# Patient Record
Sex: Male | Born: 1955 | Race: Black or African American | Hispanic: No | Marital: Married | Smoking: Never smoker
Health system: Southern US, Community
[De-identification: ages and names within clinical notes are randomized; demographics above are authoritative.]

## PROBLEM LIST (undated history)

## (undated) DIAGNOSIS — K598 Other specified functional intestinal disorders: Secondary | ICD-10-CM

## (undated) DIAGNOSIS — K5981 Ogilvie syndrome: Secondary | ICD-10-CM

## (undated) DIAGNOSIS — H548 Legal blindness, as defined in USA: Secondary | ICD-10-CM

## (undated) DIAGNOSIS — M109 Gout, unspecified: Secondary | ICD-10-CM

## (undated) DIAGNOSIS — E039 Hypothyroidism, unspecified: Secondary | ICD-10-CM

## (undated) DIAGNOSIS — M009 Pyogenic arthritis, unspecified: Secondary | ICD-10-CM

## (undated) DIAGNOSIS — K5909 Other constipation: Secondary | ICD-10-CM

## (undated) DIAGNOSIS — H9190 Unspecified hearing loss, unspecified ear: Secondary | ICD-10-CM

## (undated) HISTORY — PX: KNEE SURGERY: SHX244

## (undated) HISTORY — PX: EYE SURGERY: SHX253

## (undated) HISTORY — DX: Ogilvie syndrome: K59.81

## (undated) HISTORY — DX: Pyogenic arthritis, unspecified: M00.9

## (undated) HISTORY — DX: Other specified functional intestinal disorders: K59.8

---

## 2014-01-29 DIAGNOSIS — M24569 Contracture, unspecified knee: Secondary | ICD-10-CM | POA: Diagnosis not present

## 2014-02-18 DIAGNOSIS — H2510 Age-related nuclear cataract, unspecified eye: Secondary | ICD-10-CM | POA: Diagnosis not present

## 2014-02-18 DIAGNOSIS — H409 Unspecified glaucoma: Secondary | ICD-10-CM | POA: Diagnosis not present

## 2014-02-18 DIAGNOSIS — H4011X0 Primary open-angle glaucoma, stage unspecified: Secondary | ICD-10-CM | POA: Diagnosis not present

## 2014-02-25 DIAGNOSIS — K219 Gastro-esophageal reflux disease without esophagitis: Secondary | ICD-10-CM | POA: Diagnosis not present

## 2014-02-25 DIAGNOSIS — M24669 Ankylosis, unspecified knee: Secondary | ICD-10-CM | POA: Diagnosis not present

## 2014-02-25 DIAGNOSIS — Z79899 Other long term (current) drug therapy: Secondary | ICD-10-CM | POA: Diagnosis not present

## 2014-02-25 DIAGNOSIS — G8918 Other acute postprocedural pain: Secondary | ICD-10-CM | POA: Diagnosis not present

## 2014-02-25 DIAGNOSIS — E039 Hypothyroidism, unspecified: Secondary | ICD-10-CM | POA: Diagnosis not present

## 2014-02-25 DIAGNOSIS — M129 Arthropathy, unspecified: Secondary | ICD-10-CM | POA: Diagnosis not present

## 2014-02-25 DIAGNOSIS — H9190 Unspecified hearing loss, unspecified ear: Secondary | ICD-10-CM | POA: Diagnosis not present

## 2014-02-25 DIAGNOSIS — Z7382 Dual sensory impairment: Secondary | ICD-10-CM | POA: Diagnosis not present

## 2014-02-25 DIAGNOSIS — M24569 Contracture, unspecified knee: Secondary | ICD-10-CM | POA: Diagnosis not present

## 2014-02-25 DIAGNOSIS — H543 Unqualified visual loss, both eyes: Secondary | ICD-10-CM | POA: Diagnosis not present

## 2014-07-03 DIAGNOSIS — E039 Hypothyroidism, unspecified: Secondary | ICD-10-CM | POA: Diagnosis not present

## 2014-07-03 DIAGNOSIS — H913 Deaf nonspeaking, not elsewhere classified: Secondary | ICD-10-CM | POA: Diagnosis not present

## 2014-07-03 DIAGNOSIS — R5383 Other fatigue: Secondary | ICD-10-CM | POA: Diagnosis not present

## 2014-07-03 DIAGNOSIS — K219 Gastro-esophageal reflux disease without esophagitis: Secondary | ICD-10-CM | POA: Diagnosis not present

## 2014-07-03 DIAGNOSIS — F411 Generalized anxiety disorder: Secondary | ICD-10-CM | POA: Diagnosis not present

## 2014-07-03 DIAGNOSIS — M129 Arthropathy, unspecified: Secondary | ICD-10-CM | POA: Diagnosis not present

## 2014-07-03 DIAGNOSIS — H543 Unqualified visual loss, both eyes: Secondary | ICD-10-CM | POA: Diagnosis not present

## 2014-07-03 DIAGNOSIS — Z791 Long term (current) use of non-steroidal anti-inflammatories (NSAID): Secondary | ICD-10-CM | POA: Diagnosis not present

## 2014-07-03 DIAGNOSIS — R5381 Other malaise: Secondary | ICD-10-CM | POA: Diagnosis not present

## 2014-07-03 DIAGNOSIS — Z79899 Other long term (current) drug therapy: Secondary | ICD-10-CM | POA: Diagnosis not present

## 2014-07-03 DIAGNOSIS — R42 Dizziness and giddiness: Secondary | ICD-10-CM | POA: Diagnosis not present

## 2014-07-03 DIAGNOSIS — H548 Legal blindness, as defined in USA: Secondary | ICD-10-CM | POA: Diagnosis not present

## 2014-08-26 DIAGNOSIS — H2513 Age-related nuclear cataract, bilateral: Secondary | ICD-10-CM | POA: Diagnosis not present

## 2014-08-26 DIAGNOSIS — H4011X3 Primary open-angle glaucoma, severe stage: Secondary | ICD-10-CM | POA: Diagnosis not present

## 2014-09-17 DIAGNOSIS — K6389 Other specified diseases of intestine: Secondary | ICD-10-CM | POA: Diagnosis not present

## 2014-09-17 DIAGNOSIS — R141 Gas pain: Secondary | ICD-10-CM | POA: Diagnosis not present

## 2014-09-17 DIAGNOSIS — S8992XA Unspecified injury of left lower leg, initial encounter: Secondary | ICD-10-CM | POA: Diagnosis not present

## 2014-09-17 DIAGNOSIS — M25562 Pain in left knee: Secondary | ICD-10-CM | POA: Diagnosis not present

## 2014-09-17 DIAGNOSIS — Z79899 Other long term (current) drug therapy: Secondary | ICD-10-CM | POA: Diagnosis not present

## 2014-09-17 DIAGNOSIS — R109 Unspecified abdominal pain: Secondary | ICD-10-CM | POA: Diagnosis not present

## 2014-09-17 DIAGNOSIS — H905 Unspecified sensorineural hearing loss: Secondary | ICD-10-CM | POA: Diagnosis not present

## 2014-09-17 DIAGNOSIS — H540 Blindness, both eyes: Secondary | ICD-10-CM | POA: Diagnosis not present

## 2014-11-22 DIAGNOSIS — M25561 Pain in right knee: Secondary | ICD-10-CM | POA: Diagnosis not present

## 2014-11-22 DIAGNOSIS — R918 Other nonspecific abnormal finding of lung field: Secondary | ICD-10-CM | POA: Diagnosis not present

## 2014-11-22 DIAGNOSIS — Z79899 Other long term (current) drug therapy: Secondary | ICD-10-CM | POA: Diagnosis not present

## 2014-11-22 DIAGNOSIS — S8990XA Unspecified injury of unspecified lower leg, initial encounter: Secondary | ICD-10-CM | POA: Diagnosis not present

## 2014-11-22 DIAGNOSIS — Z01818 Encounter for other preprocedural examination: Secondary | ICD-10-CM | POA: Diagnosis not present

## 2014-11-22 DIAGNOSIS — Z760 Encounter for issue of repeat prescription: Secondary | ICD-10-CM | POA: Diagnosis not present

## 2014-11-22 DIAGNOSIS — H905 Unspecified sensorineural hearing loss: Secondary | ICD-10-CM | POA: Diagnosis not present

## 2014-11-22 DIAGNOSIS — M25562 Pain in left knee: Secondary | ICD-10-CM | POA: Diagnosis not present

## 2014-11-22 DIAGNOSIS — M009 Pyogenic arthritis, unspecified: Secondary | ICD-10-CM | POA: Diagnosis not present

## 2014-11-23 DIAGNOSIS — M25561 Pain in right knee: Secondary | ICD-10-CM | POA: Diagnosis not present

## 2014-11-23 DIAGNOSIS — Z760 Encounter for issue of repeat prescription: Secondary | ICD-10-CM | POA: Diagnosis not present

## 2014-11-23 DIAGNOSIS — M009 Pyogenic arthritis, unspecified: Secondary | ICD-10-CM | POA: Diagnosis not present

## 2014-11-23 DIAGNOSIS — R918 Other nonspecific abnormal finding of lung field: Secondary | ICD-10-CM | POA: Diagnosis not present

## 2014-11-23 DIAGNOSIS — R569 Unspecified convulsions: Secondary | ICD-10-CM | POA: Diagnosis not present

## 2014-11-23 DIAGNOSIS — S8990XA Unspecified injury of unspecified lower leg, initial encounter: Secondary | ICD-10-CM | POA: Diagnosis not present

## 2014-11-23 DIAGNOSIS — H409 Unspecified glaucoma: Secondary | ICD-10-CM | POA: Diagnosis not present

## 2014-11-23 DIAGNOSIS — H548 Legal blindness, as defined in USA: Secondary | ICD-10-CM | POA: Diagnosis not present

## 2014-11-23 DIAGNOSIS — Z01818 Encounter for other preprocedural examination: Secondary | ICD-10-CM | POA: Diagnosis not present

## 2014-11-23 DIAGNOSIS — M170 Bilateral primary osteoarthritis of knee: Secondary | ICD-10-CM | POA: Diagnosis not present

## 2014-11-23 DIAGNOSIS — M25462 Effusion, left knee: Secondary | ICD-10-CM | POA: Diagnosis not present

## 2014-11-23 DIAGNOSIS — H905 Unspecified sensorineural hearing loss: Secondary | ICD-10-CM | POA: Diagnosis not present

## 2014-11-23 DIAGNOSIS — R55 Syncope and collapse: Secondary | ICD-10-CM | POA: Diagnosis not present

## 2014-11-24 DIAGNOSIS — M25569 Pain in unspecified knee: Secondary | ICD-10-CM | POA: Diagnosis not present

## 2014-11-25 DIAGNOSIS — R55 Syncope and collapse: Secondary | ICD-10-CM | POA: Diagnosis not present

## 2014-11-25 DIAGNOSIS — R569 Unspecified convulsions: Secondary | ICD-10-CM | POA: Diagnosis not present

## 2014-11-26 DIAGNOSIS — H409 Unspecified glaucoma: Secondary | ICD-10-CM | POA: Diagnosis not present

## 2014-11-26 DIAGNOSIS — M25561 Pain in right knee: Secondary | ICD-10-CM | POA: Diagnosis not present

## 2014-11-26 DIAGNOSIS — M25562 Pain in left knee: Secondary | ICD-10-CM | POA: Diagnosis not present

## 2014-11-26 DIAGNOSIS — R112 Nausea with vomiting, unspecified: Secondary | ICD-10-CM | POA: Diagnosis not present

## 2014-11-26 DIAGNOSIS — H905 Unspecified sensorineural hearing loss: Secondary | ICD-10-CM | POA: Diagnosis not present

## 2014-11-26 DIAGNOSIS — R001 Bradycardia, unspecified: Secondary | ICD-10-CM | POA: Diagnosis not present

## 2014-11-26 DIAGNOSIS — H548 Legal blindness, as defined in USA: Secondary | ICD-10-CM | POA: Diagnosis not present

## 2014-11-26 DIAGNOSIS — R569 Unspecified convulsions: Secondary | ICD-10-CM | POA: Diagnosis not present

## 2014-11-26 DIAGNOSIS — R55 Syncope and collapse: Secondary | ICD-10-CM | POA: Diagnosis not present

## 2014-11-26 DIAGNOSIS — I495 Sick sinus syndrome: Secondary | ICD-10-CM | POA: Diagnosis not present

## 2014-11-26 DIAGNOSIS — M25461 Effusion, right knee: Secondary | ICD-10-CM | POA: Diagnosis not present

## 2014-11-26 DIAGNOSIS — Z590 Homelessness: Secondary | ICD-10-CM | POA: Diagnosis not present

## 2014-11-26 DIAGNOSIS — M25462 Effusion, left knee: Secondary | ICD-10-CM | POA: Diagnosis not present

## 2014-11-26 DIAGNOSIS — Z760 Encounter for issue of repeat prescription: Secondary | ICD-10-CM | POA: Diagnosis not present

## 2014-11-26 DIAGNOSIS — E039 Hypothyroidism, unspecified: Secondary | ICD-10-CM | POA: Diagnosis not present

## 2014-11-26 DIAGNOSIS — M170 Bilateral primary osteoarthritis of knee: Secondary | ICD-10-CM | POA: Diagnosis not present

## 2014-11-29 DIAGNOSIS — M25462 Effusion, left knee: Secondary | ICD-10-CM | POA: Diagnosis not present

## 2014-11-29 DIAGNOSIS — H548 Legal blindness, as defined in USA: Secondary | ICD-10-CM | POA: Diagnosis not present

## 2014-11-29 DIAGNOSIS — E039 Hypothyroidism, unspecified: Secondary | ICD-10-CM | POA: Diagnosis not present

## 2014-11-29 DIAGNOSIS — M170 Bilateral primary osteoarthritis of knee: Secondary | ICD-10-CM | POA: Diagnosis not present

## 2014-11-29 DIAGNOSIS — M25561 Pain in right knee: Secondary | ICD-10-CM | POA: Diagnosis not present

## 2014-11-29 DIAGNOSIS — H905 Unspecified sensorineural hearing loss: Secondary | ICD-10-CM | POA: Diagnosis not present

## 2014-11-29 DIAGNOSIS — H409 Unspecified glaucoma: Secondary | ICD-10-CM | POA: Diagnosis not present

## 2014-11-29 DIAGNOSIS — M25562 Pain in left knee: Secondary | ICD-10-CM | POA: Diagnosis not present

## 2014-12-01 DIAGNOSIS — M25561 Pain in right knee: Secondary | ICD-10-CM | POA: Diagnosis not present

## 2014-12-01 DIAGNOSIS — M25562 Pain in left knee: Secondary | ICD-10-CM | POA: Diagnosis not present

## 2014-12-01 DIAGNOSIS — E039 Hypothyroidism, unspecified: Secondary | ICD-10-CM | POA: Diagnosis not present

## 2014-12-01 DIAGNOSIS — H409 Unspecified glaucoma: Secondary | ICD-10-CM | POA: Diagnosis not present

## 2014-12-04 DIAGNOSIS — M25562 Pain in left knee: Secondary | ICD-10-CM | POA: Diagnosis not present

## 2014-12-04 DIAGNOSIS — E039 Hypothyroidism, unspecified: Secondary | ICD-10-CM | POA: Diagnosis not present

## 2014-12-04 DIAGNOSIS — H409 Unspecified glaucoma: Secondary | ICD-10-CM | POA: Diagnosis not present

## 2014-12-04 DIAGNOSIS — M25561 Pain in right knee: Secondary | ICD-10-CM | POA: Diagnosis not present

## 2014-12-11 DIAGNOSIS — H9190 Unspecified hearing loss, unspecified ear: Secondary | ICD-10-CM | POA: Diagnosis not present

## 2014-12-11 DIAGNOSIS — M25562 Pain in left knee: Secondary | ICD-10-CM | POA: Diagnosis not present

## 2014-12-11 DIAGNOSIS — M25561 Pain in right knee: Secondary | ICD-10-CM | POA: Diagnosis not present

## 2014-12-11 DIAGNOSIS — Z79899 Other long term (current) drug therapy: Secondary | ICD-10-CM | POA: Diagnosis not present

## 2014-12-11 DIAGNOSIS — E039 Hypothyroidism, unspecified: Secondary | ICD-10-CM | POA: Diagnosis not present

## 2014-12-11 DIAGNOSIS — M179 Osteoarthritis of knee, unspecified: Secondary | ICD-10-CM | POA: Diagnosis not present

## 2014-12-11 DIAGNOSIS — S79912A Unspecified injury of left hip, initial encounter: Secondary | ICD-10-CM | POA: Diagnosis not present

## 2014-12-11 DIAGNOSIS — S8992XA Unspecified injury of left lower leg, initial encounter: Secondary | ICD-10-CM | POA: Diagnosis not present

## 2014-12-11 DIAGNOSIS — F42 Obsessive-compulsive disorder: Secondary | ICD-10-CM | POA: Diagnosis not present

## 2014-12-11 DIAGNOSIS — H540 Blindness, both eyes: Secondary | ICD-10-CM | POA: Diagnosis not present

## 2014-12-12 DIAGNOSIS — M25562 Pain in left knee: Secondary | ICD-10-CM | POA: Diagnosis not present

## 2014-12-12 DIAGNOSIS — R251 Tremor, unspecified: Secondary | ICD-10-CM | POA: Diagnosis not present

## 2014-12-12 DIAGNOSIS — M79605 Pain in left leg: Secondary | ICD-10-CM | POA: Diagnosis not present

## 2014-12-12 DIAGNOSIS — R4182 Altered mental status, unspecified: Secondary | ICD-10-CM | POA: Diagnosis not present

## 2014-12-12 DIAGNOSIS — R258 Other abnormal involuntary movements: Secondary | ICD-10-CM | POA: Diagnosis not present

## 2014-12-12 DIAGNOSIS — G8929 Other chronic pain: Secondary | ICD-10-CM | POA: Diagnosis not present

## 2014-12-12 DIAGNOSIS — R55 Syncope and collapse: Secondary | ICD-10-CM | POA: Diagnosis not present

## 2015-05-14 DIAGNOSIS — H905 Unspecified sensorineural hearing loss: Secondary | ICD-10-CM | POA: Diagnosis not present

## 2015-05-14 DIAGNOSIS — H548 Legal blindness, as defined in USA: Secondary | ICD-10-CM | POA: Diagnosis not present

## 2015-05-14 DIAGNOSIS — K5900 Constipation, unspecified: Secondary | ICD-10-CM | POA: Diagnosis not present

## 2015-05-14 DIAGNOSIS — R10817 Generalized abdominal tenderness: Secondary | ICD-10-CM | POA: Diagnosis not present

## 2015-05-14 DIAGNOSIS — G8929 Other chronic pain: Secondary | ICD-10-CM | POA: Diagnosis not present

## 2015-05-14 DIAGNOSIS — Z79899 Other long term (current) drug therapy: Secondary | ICD-10-CM | POA: Diagnosis not present

## 2015-05-14 DIAGNOSIS — K6389 Other specified diseases of intestine: Secondary | ICD-10-CM | POA: Diagnosis not present

## 2015-05-14 DIAGNOSIS — K593 Megacolon, not elsewhere classified: Secondary | ICD-10-CM | POA: Diagnosis not present

## 2015-05-14 DIAGNOSIS — M064 Inflammatory polyarthropathy: Secondary | ICD-10-CM | POA: Diagnosis not present

## 2015-05-14 DIAGNOSIS — E039 Hypothyroidism, unspecified: Secondary | ICD-10-CM | POA: Diagnosis not present

## 2015-05-15 DIAGNOSIS — K593 Megacolon, not elsewhere classified: Secondary | ICD-10-CM | POA: Diagnosis not present

## 2015-08-03 ENCOUNTER — Emergency Department (HOSPITAL_COMMUNITY)
Admission: EM | Admit: 2015-08-03 | Discharge: 2015-08-04 | Disposition: A | Discharge status: Home / Self Care | Payer: Medicare Other | Attending: Emergency Medicine | Admitting: Emergency Medicine

## 2015-08-03 ENCOUNTER — Emergency Department (HOSPITAL_COMMUNITY): Payer: Medicare Other

## 2015-08-03 ENCOUNTER — Encounter (HOSPITAL_COMMUNITY): Payer: Self-pay | Admitting: Emergency Medicine

## 2015-08-03 DIAGNOSIS — Z79899 Other long term (current) drug therapy: Secondary | ICD-10-CM | POA: Diagnosis not present

## 2015-08-03 DIAGNOSIS — R1084 Generalized abdominal pain: Secondary | ICD-10-CM

## 2015-08-03 DIAGNOSIS — R112 Nausea with vomiting, unspecified: Secondary | ICD-10-CM | POA: Diagnosis not present

## 2015-08-03 DIAGNOSIS — R935 Abnormal findings on diagnostic imaging of other abdominal regions, including retroperitoneum: Secondary | ICD-10-CM | POA: Insufficient documentation

## 2015-08-03 DIAGNOSIS — H913 Deaf nonspeaking, not elsewhere classified: Secondary | ICD-10-CM | POA: Insufficient documentation

## 2015-08-03 DIAGNOSIS — H548 Legal blindness, as defined in USA: Secondary | ICD-10-CM | POA: Insufficient documentation

## 2015-08-03 DIAGNOSIS — R948 Abnormal results of function studies of other organs and systems: Secondary | ICD-10-CM | POA: Diagnosis not present

## 2015-08-03 HISTORY — DX: Legal blindness, as defined in USA: H54.8

## 2015-08-03 HISTORY — DX: Unspecified hearing loss, unspecified ear: H91.90

## 2015-08-03 LAB — CBC
HEMATOCRIT: 29.6 % — AB (ref 39.0–52.0)
HEMOGLOBIN: 9.9 g/dL — AB (ref 13.0–17.0)
MCH: 31.4 pg (ref 26.0–34.0)
MCHC: 33.4 g/dL (ref 30.0–36.0)
MCV: 94 fL (ref 78.0–100.0)
Platelets: 191 10*3/uL (ref 150–400)
RBC: 3.15 MIL/uL — ABNORMAL LOW (ref 4.22–5.81)
RDW: 15.8 % — AB (ref 11.5–15.5)
WBC: 4.2 10*3/uL (ref 4.0–10.5)

## 2015-08-03 LAB — COMPREHENSIVE METABOLIC PANEL
ALBUMIN: 4.8 g/dL (ref 3.5–5.0)
ALT: 79 U/L — ABNORMAL HIGH (ref 17–63)
AST: 66 U/L — AB (ref 15–41)
Alkaline Phosphatase: 70 U/L (ref 38–126)
Anion gap: 7 (ref 5–15)
BUN: 25 mg/dL — AB (ref 6–20)
CHLORIDE: 102 mmol/L (ref 101–111)
CO2: 28 mmol/L (ref 22–32)
Calcium: 9.9 mg/dL (ref 8.9–10.3)
Creatinine, Ser: 1.26 mg/dL — ABNORMAL HIGH (ref 0.61–1.24)
GFR calc Af Amer: >60 mL/min (ref >60)
GLUCOSE: 91 mg/dL (ref 65–99)
POTASSIUM: 4 mmol/L (ref 3.5–5.1)
Sodium: 137 mmol/L (ref 135–145)
Total Bilirubin: 0.7 mg/dL (ref 0.3–1.2)
Total Protein: 9 g/dL — ABNORMAL HIGH (ref 6.5–8.1)

## 2015-08-03 LAB — LIPASE, BLOOD: LIPASE: 48 U/L (ref 11–51)

## 2015-08-03 MED ORDER — IOHEXOL 300 MG/ML  SOLN
100.0000 mL | Freq: Once | INTRAMUSCULAR | Status: AC | PRN
  Administered 2015-08-03: 100 mL via INTRAVENOUS

## 2015-08-03 MED ORDER — ONDANSETRON HCL 4 MG/2ML IJ SOLN
4.0000 mg | Freq: Once | INTRAMUSCULAR | Status: AC
  Administered 2015-08-03: 4 mg via INTRAVENOUS
  Filled 2015-08-03: qty 2

## 2015-08-03 MED ORDER — IOHEXOL 300 MG/ML  SOLN
50.0000 mL | Freq: Once | INTRAMUSCULAR | Status: AC | PRN
  Administered 2015-08-03: 50 mL via ORAL

## 2015-08-03 MED ORDER — MORPHINE SULFATE (PF) 4 MG/ML IV SOLN
4.0000 mg | Freq: Once | INTRAVENOUS | Status: AC
  Administered 2015-08-03: 4 mg via INTRAVENOUS
  Filled 2015-08-03: qty 1

## 2015-08-03 NOTE — ED Notes (Signed)
Pt is aware of the need for urine, urinal at bedside  

## 2015-08-03 NOTE — ED Notes (Signed)
Bed: [id number] Expected date:  Expected time:  Means of arrival:  Comments: abd pain, pt is deaf

## 2015-08-03 NOTE — ED Notes (Addendum)
Pt states he started having abdominal pain x 2 hours ago with distention and pain. Tenderness in RLQ. Pt is taking laxatives but unable to get accurate medical hx as pt is deaf. Alert and oriented. Blind and deaf.

## 2015-08-03 NOTE — ED Notes (Signed)
Patient transported to CT 

## 2015-08-03 NOTE — ED Notes (Signed)
Sign language interpreter able to convey plan of care to patient including: CT scan/need for urine sample/pain medications/rectal temperature. No other c/c. Acknowledges understanding.

## 2015-08-03 NOTE — ED Provider Notes (Signed)
CSN: [id number]     Arrival date & time 08/03/15  2027 History   First MD Initiated Contact with Patient 08/03/15 2105     Chief Complaint  Patient presents with  . 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  History obtained using sign language interpreter who is at bedside. Patient report he has had recurrent abdominal pain ongoing for the past month. He described pain as an uncomfortable feeling radiating across his abdomen. Pain has Progressively worse throughout the day today. Pain is now sharp and constant nothing seems to make it better or worse. He endorsed nausea and has vomited once and having some loose stools. He is feeling more fatigue. Report mild discomfort with urination. He has been taking gabapentin prescribed by his doctor for this pain without adequate relief. No complaints of fever, chills, chest pain, difficulty breathing, productive cough, back pain. He denies alcohol abuse. He has no prior abdominal surgery. He is a poor historian.    Past Medical History  Diagnosis Date  . Deaf   . Legally blind    No past surgical history on file. No family history on file. Social History  Substance Use Topics  . Smoking status: Not on file  . Smokeless tobacco: Not on file  . Alcohol Use: Not on file    Review of Systems  All other systems reviewed and are negative.     Allergies  Review of patient's allergies indicates no known allergies.  Home Medications   Prior to Admission medications   Medication Sig Start Date End Date Taking? Authorizing Provider  bimatoprost (LUMIGAN) 0.01 % SOLN Place 1 drop into both eyes at bedtime.   Yes Historical Provider, MD  diclofenac (VOLTAREN) 75 MG EC tablet Take 75 mg by mouth 2 (two) times daily.   Yes Historical Provider, MD  DORZOLAMIDE HCL-TIMOLOL MAL OP Apply 1 drop to eye 2  (two) times daily.   Yes Historical Provider, MD  gabapentin (NEURONTIN) 300 MG capsule Take 300 mg by mouth 3 (three) times daily.   Yes Historical Provider, MD  levothyroxine (SYNTHROID, LEVOTHROID) 125 MCG tablet Take 125 mcg by mouth daily before breakfast.   Yes Historical Provider, MD  Multiple Vitamin (MULTIVITAMIN WITH MINERALS) TABS tablet Take 1 tablet by mouth daily.   Yes Historical Provider, MD  senna (SENOKOT) 8.6 MG TABS tablet Take 1 tablet by mouth 2 (two) times daily as needed for mild constipation.   Yes Historical Provider, MD   BP 128/78 mmHg  Pulse 57  Resp 18  SpO2 96% Physical Exam  Constitutional: He appears well-developed and well-nourished. No distress.  African-American male nontoxic in appearance, who is both legally blind and deaf  HENT:  Head: Atraumatic.  Eyes: Conjunctivae are normal.  Neck: Neck supple.  Cardiovascular: Normal rate and regular rhythm.   Pulmonary/Chest: Effort normal and breath sounds normal. He exhibits no tenderness.  Abdominal: Bowel sounds are normal. He exhibits distension. There is tenderness (Diffuse abdominal tenderness with guarding and rebound tenderness.).  Neurological: He is alert.  Skin: No rash noted.  Psychiatric: He has a normal mood and affect.  Nursing note and vitals reviewed.   ED Course  Procedures (including critical care time)  Patient here with diffuse abdominal tenderness with guarding and rebound tenderness. Abdomen is distended but bowel sounds are present. Workup initiated.  10:49 PM EMERGENCY DEPARTMENT [place] FAST EXAM  INDICATIONS:Teaching study  PERFORMED BY: Myself  IMAGES ARCHIVED?: Yes  FINDINGS: All views negative  LIMITATIONS:  Emergent procedure  INTERPRETATION:  No abdominal free fluid and Indeterminate abdominal study. No pericardial effusion  COMMENT:  FAST exam, unable to visualize pericardium adequately due to bowel gas.    12:30 AM No evidence of leukocytosis.'s of mild renal  insufficiency with BUN 25, creatinine 1.26. IV fluid given. Abdominal and pelvis CT scan shows severe gaseous distention in the transverse colon which appears to be an isolated finding and no indication to suggest acute obstruction at this time. At this time patient also able to tolerates by mouth pain has improved.  Recommend pt to f/u with GI specialist at the earliest convenient for further evaluation of his condition.  Care discussed with Dr. Estell HarpinZammit.  Pt agrees with plan.  Return precaution discussed.    Labs Review Labs Reviewed  COMPREHENSIVE METABOLIC PANEL - Abnormal; Notable for the following:    BUN 25 (*)    Creatinine, Ser 1.26 (*)    Total Protein 9.0 (*)    AST 66 (*)    ALT 79 (*)    All other components within normal limits  CBC - Abnormal; Notable for the following:    RBC 3.15 (*)    Hemoglobin 9.9 (*)    HCT 29.6 (*)    RDW 15.8 (*)    All other components within normal limits  LIPASE, BLOOD  URINALYSIS, ROUTINE W REFLEX MICROSCOPIC (NOT AT [facility])    Imaging Review Ct Abdomen Pelvis W Contrast  08/04/2015  CLINICAL DATA:  59 year old male with abdominal pain and distention for the past 2 hours, most severe in the right lower quadrant. EXAM: CT ABDOMEN AND PELVIS WITH CONTRAST TECHNIQUE: Multidetector CT imaging of the abdomen and pelvis was performed using the standard protocol following bolus administration of intravenous contrast. CONTRAST:  50mL OMNIPAQUE IOHEXOL 300 MG/ML SOLN, 100mL OMNIPAQUE IOHEXOL 300 MG/ML SOLN COMPARISON:  No priors. 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 Hepatobiliary: No cystic or solid hepatic lesions. No intra or extrahepatic biliary ductal dilatation. Gallbladder is unremarkable in appearance. Pancreas: No pancreatic mass. No pancreatic ductal dilatation. No pancreatic  or peripancreatic fluid or inflammatory changes. Spleen: Unremarkable. Adrenals/Urinary Tract: Bilateral kidneys and bilateral adrenal glands are normal in appearance. No hydroureteronephrosis. Urinary bladder is normal in appearance. Stomach/Bowel: Normal appearance of the stomach. No pathologic dilatation of small bowel. Diffuse gaseous distention of the colon, most severe in the mid transverse colon with the colon is dilated up to 14.6 cm in diameter. The cecum and ascending colon do not appear dilated, and are filled with stool. The descending colon also does not appear dilated, and there is a small amount of gas and stool in the rectum. Appendix is not confidently identified, but there are no overt inflammatory changes adjacent to the cecum to strongly suggest presence of an acute appendicitis at this time. Vascular/Lymphatic: Atherosclerosis throughout the abdominal and pelvic vasculature, without evidence of aneurysm or dissection. No swirling of the vasculature in the mesenteries to suggest a volvulus. No lymphadenopathy noted in the abdomen or pelvis. Reproductive: Prostate gland and seminal vesicles are unremarkable in appearance. Other: No significant volume of ascites.  No pneumoperitoneum. Musculoskeletal: There are no aggressive appearing lytic or blastic lesions noted in the visualized portions of the skeleton. IMPRESSION: 1. Severe gaseous distention in  the transverse colon. This appears to be an isolated finding. Given the lack of proximal distention, and the presence of some distal gas and stool, this is not favored to be indicative of acute obstruction at this time. This may be chronic and related to dysmotility (however, we have no prior studies available for comparison). 2. No other acute findings are noted on today's examination. 3. Incidental findings, as above. Electronically Signed   By: Trudie Reed M.D.   On: 08/04/2015 00:07   I have personally reviewed and evaluated these images  and lab results as part of my medical decision-making.   EKG Interpretation None      MDM   Final diagnoses:  Generalized abdominal pain  Non-intractable vomiting with nausea, vomiting of unspecified type  Abnormal abdominal CT scan    BP 124/85 mmHg  Pulse 61  Temp(Src) 98.4 F (36.9 C) (Rectal)  Resp 17  SpO2 97%     Fayrene Helper, PA-C 08/04/15 0032  Bethann Berkshire, MD 08/06/15 1515

## 2015-08-04 DIAGNOSIS — R1084 Generalized abdominal pain: Secondary | ICD-10-CM | POA: Diagnosis not present

## 2015-08-04 DIAGNOSIS — M1712 Unilateral primary osteoarthritis, left knee: Secondary | ICD-10-CM | POA: Diagnosis not present

## 2015-08-04 DIAGNOSIS — M25562 Pain in left knee: Secondary | ICD-10-CM | POA: Diagnosis not present

## 2015-08-04 DIAGNOSIS — M25561 Pain in right knee: Secondary | ICD-10-CM | POA: Diagnosis not present

## 2015-08-04 MED ORDER — ONDANSETRON HCL 4 MG PO TABS: 4.0000 mg | ORAL_TABLET | Freq: Three times a day (TID) | ORAL | Status: DC | PRN

## 2015-08-04 MED ORDER — MORPHINE SULFATE (PF) 4 MG/ML IV SOLN
4.0000 mg | Freq: Once | INTRAVENOUS | Status: AC
  Administered 2015-08-04: 4 mg via INTRAVENOUS
  Filled 2015-08-04: qty 1

## 2015-08-04 NOTE — ED Notes (Signed)
Patient was alert, oriented and stable upon discharge. RN went over AVS and patient had no further questions. Interpreter was present for entire visit as well as d/c.

## 2015-08-04 NOTE — Discharge Instructions (Signed)
Please follow up with a GI specialist this week for further evaluation of your condition.  Take zofran as needed for nausea.  Return to ER if your condition worsen or if you have any other concerns.   Abdominal Pain, Adult Many things can cause abdominal pain. Usually, abdominal pain is not caused by a disease and will improve without treatment. It can often be observed and treated at home. Your health care provider will do a physical exam and possibly order blood tests and X-rays to help determine the seriousness of your pain. However, in many cases, more time must pass before a clear cause of the pain can be found. Before that point, your health care provider may not know if you need more testing or further treatment. HOME CARE INSTRUCTIONS Monitor your abdominal pain for any changes. The following actions may help to alleviate any discomfort you are experiencing:  Only take over-the-counter or prescription medicines as directed by your health care provider.  Do not take laxatives unless directed to do so by your health care provider.  Try a clear liquid diet (broth, tea, or water) as directed by your health care provider. Slowly move to a bland diet as tolerated. SEEK MEDICAL CARE IF:  You have unexplained abdominal pain.  You have abdominal pain associated with nausea or diarrhea.  You have pain when you urinate or have a bowel movement.  You experience abdominal pain that wakes you in the night.  You have abdominal pain that is worsened or improved by eating food.  You have abdominal pain that is worsened with eating fatty foods.  You have a fever. SEEK IMMEDIATE MEDICAL CARE IF:  Your pain does not go away within 2 hours.  You keep throwing up (vomiting).  Your pain is felt only in portions of the abdomen, such as the right side or the left lower portion of the abdomen.  You pass bloody or black tarry stools. MAKE SURE YOU:  Understand these instructions.  Will watch  your condition.  Will get help right away if you are not doing well or get worse.   This information is not intended to replace advice given to you by your health care provider. Make sure you discuss any questions you have with your health care provider.   Document Released: 07/06/2005 Document Revised: 06/17/2015 Document Reviewed: 06/05/2013 Elsevier Interactive Patient Education [employer]2016 [employer] Inc.

## 2015-12-17 ENCOUNTER — Emergency Department (HOSPITAL_COMMUNITY): Payer: Medicare Other

## 2015-12-17 ENCOUNTER — Encounter (HOSPITAL_COMMUNITY): Payer: Self-pay | Admitting: Emergency Medicine

## 2015-12-17 ENCOUNTER — Emergency Department (HOSPITAL_COMMUNITY)
Admission: EM | Admit: 2015-12-17 | Discharge: 2015-12-17 | Disposition: A | Discharge status: Home / Self Care | Payer: Medicare Other | Attending: Emergency Medicine | Admitting: Emergency Medicine

## 2015-12-17 DIAGNOSIS — Z791 Long term (current) use of non-steroidal anti-inflammatories (NSAID): Secondary | ICD-10-CM | POA: Insufficient documentation

## 2015-12-17 DIAGNOSIS — M25461 Effusion, right knee: Secondary | ICD-10-CM | POA: Diagnosis not present

## 2015-12-17 DIAGNOSIS — H548 Legal blindness, as defined in USA: Secondary | ICD-10-CM | POA: Diagnosis not present

## 2015-12-17 DIAGNOSIS — M7989 Other specified soft tissue disorders: Secondary | ICD-10-CM | POA: Diagnosis not present

## 2015-12-17 DIAGNOSIS — Z79899 Other long term (current) drug therapy: Secondary | ICD-10-CM | POA: Insufficient documentation

## 2015-12-17 DIAGNOSIS — H9190 Unspecified hearing loss, unspecified ear: Secondary | ICD-10-CM | POA: Insufficient documentation

## 2015-12-17 DIAGNOSIS — M79642 Pain in left hand: Secondary | ICD-10-CM | POA: Insufficient documentation

## 2015-12-17 DIAGNOSIS — M25561 Pain in right knee: Secondary | ICD-10-CM | POA: Diagnosis present

## 2015-12-17 DIAGNOSIS — M79643 Pain in unspecified hand: Secondary | ICD-10-CM | POA: Diagnosis not present

## 2015-12-17 DIAGNOSIS — E079 Disorder of thyroid, unspecified: Secondary | ICD-10-CM | POA: Insufficient documentation

## 2015-12-17 DIAGNOSIS — M25569 Pain in unspecified knee: Secondary | ICD-10-CM | POA: Diagnosis not present

## 2015-12-17 HISTORY — DX: Gout, unspecified: M10.9

## 2015-12-17 LAB — SYNOVIAL CELL COUNT + DIFF, W/ CRYSTALS
Crystals, Fluid: NONE SEEN
Lymphocytes-Synovial Fld: 6 % (ref 0–20)
Monocyte-Macrophage-Synovial Fluid: 3 % — ABNORMAL LOW (ref 50–90)
Neutrophil, Synovial: 91 % — ABNORMAL HIGH (ref 0–25)
WBC, Synovial: 65190 /mm3 — ABNORMAL HIGH (ref 0–200)

## 2015-12-17 LAB — GRAM STAIN

## 2015-12-17 MED ORDER — LIDOCAINE HCL 2 % IJ SOLN
20.0000 mL | Freq: Once | INTRAMUSCULAR | Status: AC
  Administered 2015-12-17: 20 mL via INTRADERMAL

## 2015-12-17 MED ORDER — HYDROCODONE-ACETAMINOPHEN 5-325 MG PO TABS: 1.0000 | ORAL_TABLET | Freq: Four times a day (QID) | ORAL | Status: DC | PRN

## 2015-12-17 MED ORDER — OXYCODONE-ACETAMINOPHEN 5-325 MG PO TABS: 1.0000 | ORAL_TABLET | Freq: Four times a day (QID) | ORAL | Status: DC | PRN

## 2015-12-17 MED ORDER — ONDANSETRON 4 MG PO TBDP
4.0000 mg | ORAL_TABLET | Freq: Once | ORAL | Status: AC
  Administered 2015-12-17: 4 mg via ORAL
  Filled 2015-12-17: qty 1

## 2015-12-17 MED ORDER — PREDNISONE 50 MG PO TABS: 50.0000 mg | ORAL_TABLET | Freq: Every day | ORAL | Status: DC

## 2015-12-17 MED ORDER — LIDOCAINE HCL 2 % IJ SOLN
INTRAMUSCULAR | Status: AC
  Administered 2015-12-17: 20 mL via INTRADERMAL
  Filled 2015-12-17: qty 20

## 2015-12-17 MED ORDER — DOXYCYCLINE HYCLATE 100 MG PO TABS
100.0000 mg | ORAL_TABLET | Freq: Once | ORAL | Status: AC
  Administered 2015-12-17: 100 mg via ORAL
  Filled 2015-12-17: qty 1

## 2015-12-17 MED ORDER — OXYCODONE-ACETAMINOPHEN 5-325 MG PO TABS
1.0000 | ORAL_TABLET | Freq: Once | ORAL | Status: AC
  Administered 2015-12-17: 1 via ORAL
  Filled 2015-12-17: qty 1

## 2015-12-17 MED ORDER — DOXYCYCLINE HYCLATE 100 MG PO CAPS: 100.0000 mg | ORAL_CAPSULE | Freq: Two times a day (BID) | ORAL | Status: DC

## 2015-12-17 NOTE — ED Notes (Signed)
Pt and wife given sandwiches and soda while waiting for new interpreter to arrive.

## 2015-12-17 NOTE — ED Notes (Signed)
Per EMS, states B/L leg pain and left hand pain-left hand swelling-has not been taking meds-possible gout

## 2015-12-17 NOTE — ED Notes (Signed)
Pt is deaf.  Interpreters bedside

## 2015-12-17 NOTE — ED Notes (Signed)
Case Management contacted.  VM left requesting assistance for Pt with medical transportation.

## 2015-12-17 NOTE — Progress Notes (Signed)
Entered in d/c instructions Please use the list of medicare doctors provided to you to assist with finding a doctor for follow up care Schedule an appointment as soon as possible for a visit As needed

## 2015-12-17 NOTE — ED Notes (Signed)
Results given to [place] Village[place] PA

## 2015-12-17 NOTE — ED Notes (Signed)
Pt ambulated in the hall with crutches.  Pt is very weak and is unable to go more than a few steps and is not safe doing so.Marland Kitchen.  [facility] notified of need for transport.  PTAR states that they will transport wife home also.

## 2015-12-17 NOTE — ED Provider Notes (Addendum)
CSN: [id number]     Arrival date & time 12/17/15  1140 History  By signing my name below, I, Bethel Born, attest that this documentation has been prepared under the direction and in the presence of [employer]. Electronically Signed: Bethel Born, ED Scribe. 12/17/2015 2:56 PM   Chief Complaint  Patient presents with  . B/L knee pain   . Hand Pain    The history is provided by the patient. A language interpreter was used (ASL).   Martin [name] is a 60 y.o. male who is legally blind and deaf with history of gout who presents to the Emergency Department complaining of recurrent, atraumatic, 8/10 in severity,  right knee pain and swelling with onset a couple of weeks ago. He has had similar swelling in the past that required draining in [place] "a long time ago". He also complains of pain at the left long finger.  Past Medical History  Diagnosis Date  . Deaf   . Legally blind   . Thyroid disease   . Gout    History reviewed. No pertinent past surgical history. No family history on file. Social History  Substance Use Topics  . Smoking status: Never Smoker   . Smokeless tobacco: None  . Alcohol Use: No    Review of Systems  All other systems negative except as documented in the HPI. All pertinent positives and negatives as reviewed in the HPI.  Allergies  Review of patient's allergies indicates no known allergies.  Home Medications   Prior to Admission medications   Medication Sig Start Date End Date Taking? Authorizing Provider  bimatoprost (LUMIGAN) 0.01 % SOLN Place 1 drop into both eyes at bedtime.    Historical Provider, MD  diclofenac (VOLTAREN) 75 MG EC tablet Take 75 mg by mouth 2 (two) times daily.    Historical Provider, MD  DORZOLAMIDE HCL-TIMOLOL MAL OP Apply 1 drop to eye 2 (two) times daily.    Historical Provider, MD  gabapentin (NEURONTIN) 300 MG capsule Take 300 mg by mouth 3 (three) times daily.    Historical Provider, MD  levothyroxine  (SYNTHROID, LEVOTHROID) 125 MCG tablet Take 125 mcg by mouth daily before breakfast.    Historical Provider, MD  Multiple Vitamin (MULTIVITAMIN WITH MINERALS) TABS tablet Take 1 tablet by mouth daily.    Historical Provider, MD  ondansetron (ZOFRAN) 4 MG tablet Take 1 tablet (4 mg total) by mouth every 8 (eight) hours as needed for nausea or vomiting. 08/04/15   Fayrene Helper, PA-C  senna (SENOKOT) 8.6 MG TABS tablet Take 1 tablet by mouth 2 (two) times daily as needed for mild constipation.    Historical Provider, MD   BP 109/78 mmHg  Pulse 70  Resp 16  SpO2 99% Physical Exam  Constitutional: He is oriented to person, place, and time. He appears well-developed and well-nourished. No distress.  HENT:  Head: Normocephalic and atraumatic.  Eyes: Conjunctivae and EOM are normal.  Neck: Neck supple. No tracheal deviation present.  Cardiovascular: Normal rate.   Pulmonary/Chest: Effort normal. No respiratory distress.  Musculoskeletal: Normal range of motion.  Tenderness and swelling noted to the right knee Tenderness over the dorsum of the left hand   Neurological: He is alert and oriented to person, place, and time.  Skin: Skin is warm and dry.  Psychiatric: He has a normal mood and affect. His behavior is normal.  Nursing note and vitals reviewed.   ED Course  Procedures (including critical care time)  ARTHROCENTESIS PROCEDURE  NOTE After consent was obtained, using sterile technique the right knee was prepped and 3ccs lidocaine 2% was used as local anesthetic. The joint was entered and 30 ml's of cloudy golden fluid was withdrawn and sent for culture. The procedure was well tolerated.  The patient is asked to continue to rest the joint for a few more days before resuming regular activities.  It may be more painful for the first 1-2 days.  Watch for fever, or increased swelling or persistent pain in the joint. Call or return to clinic prn if such symptoms occur or there is failure to improve  as anticipated.   DIAGNOSTIC STUDIES: Oxygen Saturation is 99% on RA,  normal by my interpretation.    COORDINATION OF CARE: 12:44 PM Discussed treatment plan which includes right knee XR with pt at bedside and pt agreed to plan.  Imaging Review Dg Knee Complete 4 Views Right  12/17/2015  CLINICAL DATA:  Bilateral leg pain, history of gout, pain and swelling right knee for 2 days EXAM: RIGHT KNEE - COMPLETE 4+ VIEW COMPARISON:  None. FINDINGS: Five views of the right knee submitted. No acute fracture or subluxation. Mild narrowing of medial joint compartment. There is old fracture deformity proximal shaft of right fibula. Moderate joint effusion. IMPRESSION: No acute fracture or subluxation. Mild degenerative changes. Moderate joint effusion. Old fracture of proximal shaft of the right fibula. Electronically Signed   By: Natasha MeadLiviu  Pop M.D.   On: 12/17/2015 13:36   I have personally reviewed and evaluated these images as part of my medical decision-making.  Patient be placed in a knee immobilizer with crutches.  Patient is referred to orthopedics.  Told to return here as needed I spoke with Dr. Eulah PontMurphy who reviewed the patient's lab testing and will follow-up with him tomorrow in his office.  He wants to start the patient on doxycycline.  Patient is given the plan and all questions were answered Charlestine NightChristopher Climmie Buelow, PA-C 12/17/15 1554  Derwood KaplanAnkit Nanavati, MD 12/17/15 1619  Charlestine Nighthristopher Salar Molden, PA-C 12/17/15 1736  Derwood KaplanAnkit Nanavati, MD 12/18/15 1513

## 2015-12-17 NOTE — Progress Notes (Signed)
Pt with 2 ED visits and 0 admissions legally blind and deaf with sign language interpreter and male visitor at bedside  Pt confirms no pcp x 2 Pt given a list of medicare providers within his zip code to assist with finding a pcp/family doctor for f/u services

## 2015-12-17 NOTE — Discharge Instructions (Signed)
Return here as needed. Ice and elevate the knee. Follow up with the orthopedist provided.

## 2015-12-17 NOTE — ED Notes (Signed)
Pt left with PTAR at 1900

## 2015-12-18 ENCOUNTER — Telehealth: Payer: Self-pay | Admitting: *Deleted

## 2015-12-21 ENCOUNTER — Inpatient Hospital Stay (HOSPITAL_COMMUNITY): Payer: Medicare Other

## 2015-12-21 ENCOUNTER — Inpatient Hospital Stay (HOSPITAL_COMMUNITY)
Admission: AD | Admit: 2015-12-21 | Discharge: 2015-12-25 | DRG: 488 | Disposition: A | Discharge status: Home Health | Payer: Medicare Other | Source: Ambulatory Visit | Attending: Family Medicine | Admitting: Family Medicine

## 2015-12-21 ENCOUNTER — Encounter (HOSPITAL_COMMUNITY): Payer: Self-pay | Admitting: General Practice

## 2015-12-21 DIAGNOSIS — E43 Unspecified severe protein-calorie malnutrition: Secondary | ICD-10-CM | POA: Insufficient documentation

## 2015-12-21 DIAGNOSIS — M7989 Other specified soft tissue disorders: Secondary | ICD-10-CM | POA: Diagnosis not present

## 2015-12-21 DIAGNOSIS — M109 Gout, unspecified: Secondary | ICD-10-CM | POA: Diagnosis present

## 2015-12-21 DIAGNOSIS — K5900 Constipation, unspecified: Secondary | ICD-10-CM | POA: Diagnosis not present

## 2015-12-21 DIAGNOSIS — H9190 Unspecified hearing loss, unspecified ear: Secondary | ICD-10-CM | POA: Diagnosis present

## 2015-12-21 DIAGNOSIS — K5909 Other constipation: Secondary | ICD-10-CM | POA: Diagnosis not present

## 2015-12-21 DIAGNOSIS — H9193 Unspecified hearing loss, bilateral: Secondary | ICD-10-CM | POA: Diagnosis not present

## 2015-12-21 DIAGNOSIS — G894 Chronic pain syndrome: Secondary | ICD-10-CM

## 2015-12-21 DIAGNOSIS — Z638 Other specified problems related to primary support group: Secondary | ICD-10-CM | POA: Diagnosis not present

## 2015-12-21 DIAGNOSIS — M25562 Pain in left knee: Secondary | ICD-10-CM | POA: Diagnosis not present

## 2015-12-21 DIAGNOSIS — S83241A Other tear of medial meniscus, current injury, right knee, initial encounter: Secondary | ICD-10-CM | POA: Diagnosis not present

## 2015-12-21 DIAGNOSIS — Z23 Encounter for immunization: Secondary | ICD-10-CM | POA: Diagnosis not present

## 2015-12-21 DIAGNOSIS — H548 Legal blindness, as defined in USA: Secondary | ICD-10-CM | POA: Diagnosis not present

## 2015-12-21 DIAGNOSIS — R740 Nonspecific elevation of levels of transaminase and lactic acid dehydrogenase [LDH]: Secondary | ICD-10-CM | POA: Diagnosis not present

## 2015-12-21 DIAGNOSIS — E039 Hypothyroidism, unspecified: Secondary | ICD-10-CM | POA: Diagnosis not present

## 2015-12-21 DIAGNOSIS — B9689 Other specified bacterial agents as the cause of diseases classified elsewhere: Secondary | ICD-10-CM | POA: Diagnosis not present

## 2015-12-21 DIAGNOSIS — K567 Ileus, unspecified: Secondary | ICD-10-CM | POA: Diagnosis present

## 2015-12-21 DIAGNOSIS — Z681 Body mass index (BMI) 19 or less, adult: Secondary | ICD-10-CM

## 2015-12-21 DIAGNOSIS — M25461 Effusion, right knee: Secondary | ICD-10-CM | POA: Diagnosis present

## 2015-12-21 DIAGNOSIS — M19042 Primary osteoarthritis, left hand: Secondary | ICD-10-CM | POA: Diagnosis present

## 2015-12-21 DIAGNOSIS — R6521 Severe sepsis with septic shock: Secondary | ICD-10-CM | POA: Diagnosis not present

## 2015-12-21 DIAGNOSIS — N179 Acute kidney failure, unspecified: Secondary | ICD-10-CM | POA: Diagnosis present

## 2015-12-21 DIAGNOSIS — M25561 Pain in right knee: Secondary | ICD-10-CM | POA: Diagnosis not present

## 2015-12-21 DIAGNOSIS — M009 Pyogenic arthritis, unspecified: Principal | ICD-10-CM

## 2015-12-21 DIAGNOSIS — S83281A Other tear of lateral meniscus, current injury, right knee, initial encounter: Secondary | ICD-10-CM | POA: Diagnosis not present

## 2015-12-21 DIAGNOSIS — M00861 Arthritis due to other bacteria, right knee: Secondary | ICD-10-CM | POA: Diagnosis not present

## 2015-12-21 DIAGNOSIS — E038 Other specified hypothyroidism: Secondary | ICD-10-CM | POA: Diagnosis not present

## 2015-12-21 DIAGNOSIS — M25442 Effusion, left hand: Secondary | ICD-10-CM | POA: Diagnosis not present

## 2015-12-21 DIAGNOSIS — M00061 Staphylococcal arthritis, right knee: Secondary | ICD-10-CM

## 2015-12-21 HISTORY — DX: Hypothyroidism, unspecified: E03.9

## 2015-12-21 HISTORY — DX: Other constipation: K59.09

## 2015-12-21 LAB — PROTIME-INR
INR: 1.1 (ref 0.00–1.49)
Prothrombin Time: 14.4 seconds (ref 11.6–15.2)

## 2015-12-21 LAB — CBC WITH DIFFERENTIAL/PLATELET
Basophils Absolute: 0 10*3/uL (ref 0.0–0.1)
Basophils Relative: 1 %
EOS PCT: 3 %
Eosinophils Absolute: 0.1 10*3/uL (ref 0.0–0.7)
HCT: 33.5 % — ABNORMAL LOW (ref 39.0–52.0)
Hemoglobin: 10.9 g/dL — ABNORMAL LOW (ref 13.0–17.0)
LYMPHS ABS: 1.1 10*3/uL (ref 0.7–4.0)
LYMPHS PCT: 29 %
MCH: 30.2 pg (ref 26.0–34.0)
MCHC: 32.5 g/dL (ref 30.0–36.0)
MCV: 92.8 fL (ref 78.0–100.0)
MONO ABS: 0.3 10*3/uL (ref 0.1–1.0)
MONOS PCT: 7 %
NEUTROS ABS: 2.2 10*3/uL (ref 1.7–7.7)
Neutrophils Relative %: 60 %
PLATELETS: ADEQUATE 10*3/uL (ref 150–400)
RBC: 3.61 MIL/uL — AB (ref 4.22–5.81)
RDW: 14.9 % (ref 11.5–15.5)
WBC: 3.7 10*3/uL — AB (ref 4.0–10.5)

## 2015-12-21 LAB — COMPREHENSIVE METABOLIC PANEL
ALT: 37 U/L (ref 17–63)
ANION GAP: 11 (ref 5–15)
AST: 48 U/L — ABNORMAL HIGH (ref 15–41)
Albumin: 4.1 g/dL (ref 3.5–5.0)
Alkaline Phosphatase: 76 U/L (ref 38–126)
BUN: 19 mg/dL (ref 6–20)
CHLORIDE: 104 mmol/L (ref 101–111)
CO2: 26 mmol/L (ref 22–32)
Calcium: 9.7 mg/dL (ref 8.9–10.3)
Creatinine, Ser: 1.12 mg/dL (ref 0.61–1.24)
GFR calc non Af Amer: >60 mL/min (ref >60)
Glucose, Bld: 87 mg/dL (ref 65–99)
POTASSIUM: 5.1 mmol/L (ref 3.5–5.1)
SODIUM: 141 mmol/L (ref 135–145)
Total Bilirubin: 0.6 mg/dL (ref 0.3–1.2)
Total Protein: 8.5 g/dL — ABNORMAL HIGH (ref 6.5–8.1)

## 2015-12-21 LAB — MAGNESIUM: Magnesium: 2 mg/dL (ref 1.7–2.4)

## 2015-12-21 LAB — PHOSPHORUS: PHOSPHORUS: 2.7 mg/dL (ref 2.5–4.6)

## 2015-12-21 LAB — APTT: aPTT: 34 seconds (ref 24–37)

## 2015-12-21 MED ORDER — ONDANSETRON HCL 4 MG/2ML IJ SOLN: 4.0000 mg | Freq: Four times a day (QID) | INTRAMUSCULAR | Status: DC | PRN

## 2015-12-21 MED ORDER — LATANOPROST 0.005 % OP SOLN
1.0000 [drp] | Freq: Every day | OPHTHALMIC | Status: DC
  Administered 2015-12-21 – 2015-12-24 (×4): 1 [drp] via OPHTHALMIC
  Filled 2015-12-21 (×2): qty 2.5

## 2015-12-21 MED ORDER — POLYETHYLENE GLYCOL 3350 17 G PO PACK
34.0000 g | PACK | Freq: Once | ORAL | Status: AC
  Administered 2015-12-21: 34 g via ORAL
  Filled 2015-12-21: qty 2

## 2015-12-21 MED ORDER — HEPARIN SODIUM (PORCINE) 5000 UNIT/ML IJ SOLN
5000.0000 [IU] | Freq: Once | INTRAMUSCULAR | Status: DC
  Filled 2015-12-21: qty 1

## 2015-12-21 MED ORDER — LEVOTHYROXINE SODIUM 25 MCG PO TABS
125.0000 ug | ORAL_TABLET | Freq: Every day | ORAL | Status: DC
  Administered 2015-12-22 – 2015-12-25 (×4): 125 ug via ORAL
  Filled 2015-12-21 (×5): qty 1

## 2015-12-21 MED ORDER — MORPHINE SULFATE (PF) 4 MG/ML IV SOLN: 4.0000 mg | INTRAVENOUS | Status: DC | PRN

## 2015-12-21 MED ORDER — DICLOFENAC SODIUM 75 MG PO TBEC
75.0000 mg | DELAYED_RELEASE_TABLET | Freq: Two times a day (BID) | ORAL | Status: DC
  Administered 2015-12-21 – 2015-12-22 (×2): 75 mg via ORAL
  Filled 2015-12-21 (×4): qty 1

## 2015-12-21 MED ORDER — DORZOLAMIDE HCL-TIMOLOL MAL 2-0.5 % OP SOLN
1.0000 [drp] | Freq: Two times a day (BID) | OPHTHALMIC | Status: DC
  Administered 2015-12-21 – 2015-12-25 (×8): 1 [drp] via OPHTHALMIC
  Filled 2015-12-21: qty 10

## 2015-12-21 MED ORDER — OXYCODONE-ACETAMINOPHEN 5-325 MG PO TABS
1.0000 | ORAL_TABLET | Freq: Four times a day (QID) | ORAL | Status: DC | PRN
  Administered 2015-12-21 – 2015-12-23 (×4): 1 via ORAL
  Filled 2015-12-21 (×4): qty 1

## 2015-12-21 MED ORDER — ENSURE ENLIVE PO LIQD
237.0000 mL | Freq: Two times a day (BID) | ORAL | Status: DC
  Administered 2015-12-21 – 2015-12-25 (×5): 237 mL via ORAL

## 2015-12-21 MED ORDER — INFLUENZA VAC SPLIT QUAD 0.5 ML IM SUSY
0.5000 mL | PREFILLED_SYRINGE | INTRAMUSCULAR | Status: AC
  Administered 2015-12-23: 0.5 mL via INTRAMUSCULAR
  Filled 2015-12-21: qty 0.5

## 2015-12-21 MED ORDER — SODIUM CHLORIDE 0.45 % IV SOLN
INTRAVENOUS | Status: DC
  Administered 2015-12-21: 17:00:00 via INTRAVENOUS

## 2015-12-21 MED ORDER — SENNA 8.6 MG PO TABS
2.0000 | ORAL_TABLET | Freq: Two times a day (BID) | ORAL | Status: DC
  Administered 2015-12-21 – 2015-12-25 (×6): 17.2 mg via ORAL
  Filled 2015-12-21 (×8): qty 2

## 2015-12-21 MED ORDER — DOXYCYCLINE HYCLATE 100 MG PO TABS
100.0000 mg | ORAL_TABLET | Freq: Two times a day (BID) | ORAL | Status: DC
  Administered 2015-12-21: 100 mg via ORAL
  Filled 2015-12-21: qty 1

## 2015-12-21 MED ORDER — ONDANSETRON HCL 4 MG PO TABS: 4.0000 mg | ORAL_TABLET | Freq: Four times a day (QID) | ORAL | Status: DC | PRN

## 2015-12-21 MED ORDER — GABAPENTIN 300 MG PO CAPS
300.0000 mg | ORAL_CAPSULE | Freq: Three times a day (TID) | ORAL | Status: DC
  Administered 2015-12-21 – 2015-12-25 (×7): 300 mg via ORAL
  Filled 2015-12-21 (×10): qty 1

## 2015-12-21 NOTE — H&P (Signed)
[facility] History and Physical  Martin [name] ZHY:[id number] DOB: 1956/06/02 DOA: 12/21/2015  Referring physician: Renaye Rakers  PCP: [facility]   Chief Complaint: R knee swelling and hand swelling and pain  HPI: Martin [name] is a 60 y.o. male  Pt is legally blind and deaf. Hx obtained from Dr. Eulah Pont adn through ASL interpreter at bedside.  Left knee swelling for 3-4 weeks. Intermittent initially but now constant. Painful. Nothing makes his symptoms better. Has not tried any medications for her symptoms. Occasionally will use ice without significant benefit. Worse with ambulation. Denies any fevers, nausea, vomiting, rash, chest pain, shortness of breath, palpitations. Endorses left hand swelling over roughly the same period of time the patient states that this started after the right knee. Additionally patient states he has had a long problem with constipation. Takes occasional Senokot with some relief. Last bowel movement was very small and hard this morning. States that his abdomen is a little more distended and uncomfortable than normal. Denies any diarrhea, hematochezia, melena  Review of Systems:  Per history of present illness with all other systems negative.  Past Medical History  Diagnosis Date  . Deaf   . Legally blind   . Gout   . Hypothyroidism   . Chronic constipation    Past Surgical History  Procedure Laterality Date  . Knee surgery Left 2015/2016  . Eye surgery Right     "relieved fluid pressure and cleaned it out"   Social History:  reports that he has never smoked. He has never used smokeless tobacco. He reports that he does not drink alcohol or use illicit drugs.  No Known Allergies  Family History  Problem Relation Age of Onset  . Family history unknown: Yes     Prior to Admission medications   Medication Sig Start Date End Date Taking? Authorizing Provider  bimatoprost (LUMIGAN) 0.01 % SOLN Place 1 drop into both eyes at bedtime.    Historical  Provider, MD  diclofenac (VOLTAREN) 75 MG EC tablet Take 75 mg by mouth 2 (two) times daily.    Historical Provider, MD  DORZOLAMIDE HCL-TIMOLOL MAL OP Apply 1 drop to eye 2 (two) times daily.    Historical Provider, MD  doxycycline (VIBRAMYCIN) 100 MG capsule Take 1 capsule (100 mg total) by mouth 2 (two) times daily. 12/17/15   Charlestine Night, PA-C  gabapentin (NEURONTIN) 300 MG capsule Take 300 mg by mouth 3 (three) times daily.    Historical Provider, MD  HYDROcodone-acetaminophen (NORCO/VICODIN) 5-325 MG tablet Take 1 tablet by mouth every 6 (six) hours as needed for moderate pain. 12/17/15   Charlestine Night, PA-C  levothyroxine (SYNTHROID, LEVOTHROID) 125 MCG tablet Take 125 mcg by mouth daily before breakfast.    Historical Provider, MD  Multiple Vitamin (MULTIVITAMIN WITH MINERALS) TABS tablet Take 1 tablet by mouth daily.    Historical Provider, MD  ondansetron (ZOFRAN) 4 MG tablet Take 1 tablet (4 mg total) by mouth every 8 (eight) hours as needed for nausea or vomiting. 08/04/15   Fayrene Helper, PA-C  oxyCODONE-acetaminophen (PERCOCET/ROXICET) 5-325 MG tablet Take 1 tablet by mouth every 6 (six) hours as needed for severe pain. 12/17/15   Charlestine Night, PA-C  predniSONE (DELTASONE) 50 MG tablet Take 1 tablet (50 mg total) by mouth daily. 12/17/15   Charlestine Night, PA-C  senna (SENOKOT) 8.6 MG TABS tablet Take 1 tablet by mouth 2 (two) times daily as needed for mild constipation.    Historical Provider, MD   Physical  Exam: Filed Vitals:   12/21/15 1100  BP: 128/68  Pulse: 69  Temp: 97.6 F (36.4 C)  TempSrc: Oral  Resp: 16  SpO2: 98%    Wt Readings from Last 3 Encounters:  No data found for Wt    General:  Appears calm and comfortable Eyes:  PERRL, EOMI, normal lids, iris ENT:  grossly normal hearing, lips & tongue Neck:  no LAD, masses or thyromegaly Cardiovascular:  RRR, no m/r/g. No LE edema.  Respiratory:  CTA bilaterally, no w/r/r. Normal respiratory  effort. Abdomen:  soft, nontender, normoactive bowel sounds, distended Skin:  no rash or induration seen on limited exam Musculoskeletal:  Right knee erythematous with small effusion and tender to palpation.& Also with swelling along the dorsal aspect of the hand with tenderness throughout. No bony abnormality appreciated.` Psychiatric:  grossly normal mood and affect, speech fluent and appropriate Neurologic:  CN 2-12 grossly intact, moves all extremities in coordinated fashion.          Labs on Admission:  Basic Metabolic Panel: No results for input(s): NA, K, CL, CO2, GLUCOSE, BUN, CREATININE, CALCIUM, MG, PHOS in the last 168 hours. Liver Function Tests: No results for input(s): AST, ALT, ALKPHOS, BILITOT, PROT, ALBUMIN in the last 168 hours. No results for input(s): LIPASE, AMYLASE in the last 168 hours. No results for input(s): AMMONIA in the last 168 hours. CBC: No results for input(s): WBC, NEUTROABS, HGB, HCT, MCV, PLT in the last 168 hours. Cardiac Enzymes: No results for input(s): CKTOTAL, CKMB, CKMBINDEX, TROPONINI in the last 168 hours.  BNP (last 3 results) No results for input(s): BNP in the last 8760 hours.  ProBNP (last 3 results) No results for input(s): PROBNP in the last 8760 hours.   Creatinine clearance cannot be calculated (Unknown ideal weight.)  CBG: No results for input(s): GLUCAP in the last 168 hours.  Radiological Exams on Admission: No results found.   Assessment/Plan Active Problems:   Septic joint (HCC)   Chronic pain syndrome   Legally blind   Deaf   Hypothyroidism   Swelling of left hand   R Knee Septic Joint: Surgery planned for 12/22/15 - Renaye Rakersim [facility]. R jknee effusion showing turbid fluid w/ WBC 65190, Neutrophil 91%.  - Med surge - operative washout and mgt per [facility] - Tim.  - Continue outpt Doxy - BCX  - Pre-op coags and EKG - f/u aspirate Cx.  L Hand swelling and pain: Dr. Janee Mornhompson of Hand consulted and will evaluate  pt per Dr. Eulah PontMurphy.  - L hand plain film - f/u Dr Carollee Massedhompson's recs  Constipation: chronic condition for pt. Small BM this morning. Abd discomfort above baseline - Senna, Miralax - KUB  Chronic pain: - continue home percocet - continue Voltaren - Continue neurontin  Legal Blindness: - continue home eye drops  Hypothyroid: - continue synthroid  Psychosocial: poor home situation w/ regards to ongoing medical care  - CSW and case mgt.     Code Status: FULL  DVT Prophylaxis: Hep x1 then SCD Family Communication: multiple family members present at time of admission Disposition Plan: Pending Improvement    Lincoln Ginley Shela CommonsJ, MD Family Medicine [facility] [email or web address] Password [email or web address]
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
## 2015-12-21 NOTE — Progress Notes (Signed)
[facility] - Renaye R[facility]im [facility]. Washout scheduled for tomorrow. Additionally, will be seen by Dr Janee Mornhompson of Hand surgery for possible hand infection. Pt blind and poor home social situation. Pt coming in to med surge bed. AFVSS.   Shelly Flattenavid Danilynn Jemison, MD [facility] 12/21/2015, 10:51 AM

## 2015-12-21 NOTE — Consult Note (Signed)
ORTHOPAEDIC CONSULTATION HISTORY & PHYSICAL REQUESTING PHYSICIAN: Martin Rocksavid J Merrell, MD  Chief Complaint: left hand swelling  HPI: Martin [name] is a 60 y.o. male , deaf and legally blind, who was admitted today to the hospital preoperatively for an arthroscopic washout of his knee.  I was asked to evaluate his left hand swelling.  He tells me that he has a long history of pain and intermittent swelling of the left hand.  He is unaware of his exact diagnosis, but seems to have seen a doctor for inflamed joints in the past.  At baseline, he has poor function of the RF & SF, but the present flare has caused swelling about the MCPs of the IF/LF into the long finger.  It has been flared for a few weeks, and now isn't as largely swollen as it once was.  Past Medical History  Diagnosis Date  . Deaf   . Legally blind   . Gout   . Hypothyroidism   . Chronic constipation    Past Surgical History  Procedure Laterality Date  . Knee surgery Left 2015/2016  . Eye surgery Right     "relieved fluid pressure and cleaned it out"   Social History   Social History  . Marital Status: Married    Spouse Name: N/A  . Number of Children: N/A  . Years of Education: N/A   Social History Main Topics  . Smoking status: Never Smoker   . Smokeless tobacco: Never Used  . Alcohol Use: No  . Drug Use: No  . Sexual Activity: Yes   Other Topics Concern  . None   Social History Narrative   Family History  Problem Relation Age of Onset  . Family history unknown: Yes   No Known Allergies Prior to Admission medications   Medication Sig Start Date End Date Taking? Authorizing Provider  bimatoprost (LUMIGAN) 0.01 % SOLN Place 1 drop into both eyes at bedtime.    Historical Provider, MD  diclofenac (VOLTAREN) 75 MG EC tablet Take 75 mg by mouth 2 (two) times daily.    Historical Provider, MD  DORZOLAMIDE HCL-TIMOLOL MAL OP Apply 1 drop to eye 2 (two) times daily.    Historical Provider, MD  doxycycline  (VIBRAMYCIN) 100 MG capsule Take 1 capsule (100 mg total) by mouth 2 (two) times daily. 12/17/15   Charlestine Nighthristopher Lawyer, PA-C  gabapentin (NEURONTIN) 300 MG capsule Take 300 mg by mouth 3 (three) times daily.    Historical Provider, MD  HYDROcodone-acetaminophen (NORCO/VICODIN) 5-325 MG tablet Take 1 tablet by mouth every 6 (six) hours as needed for moderate pain. 12/17/15   Charlestine Nighthristopher Lawyer, PA-C  levothyroxine (SYNTHROID, LEVOTHROID) 125 MCG tablet Take 125 mcg by mouth daily before breakfast.    Historical Provider, MD  Multiple Vitamin (MULTIVITAMIN WITH MINERALS) TABS tablet Take 1 tablet by mouth daily.    Historical Provider, MD  ondansetron (ZOFRAN) 4 MG tablet Take 1 tablet (4 mg total) by mouth every 8 (eight) hours as needed for nausea or vomiting. 08/04/15   Fayrene HelperBowie Tran, PA-C  oxyCODONE-acetaminophen (PERCOCET/ROXICET) 5-325 MG tablet Take 1 tablet by mouth every 6 (six) hours as needed for severe pain. 12/17/15   Charlestine Nighthristopher Lawyer, PA-C  senna (SENOKOT) 8.6 MG TABS tablet Take 1 tablet by mouth 2 (two) times daily as needed for mild constipation.    Historical Provider, MD   Abd 1 View (kub)  12/21/2015  CLINICAL DATA:  Abdominal pain and constipation for 2 days. EXAM: ABDOMEN - 1 VIEW  COMPARISON:  CT scan of August 03, 2015. FINDINGS: No small bowel dilatation is noted. Severe colonic distention is noted which was present on prior CT scan. This most likely represents adynamic ileus. IMPRESSION: Stable probable chronic colonic distention compared to prior exam. This may represent adynamic ileus. Electronically Signed   By: Lupita Raider, M.D.   On: 12/21/2015 15:23   Dg Hand Complete Left  12/21/2015  CLINICAL DATA:  Left hand pain for 1 week. EXAM: LEFT HAND - COMPLETE 3+ VIEW COMPARISON:  None. FINDINGS: Mild degenerative changes are noted in the DIP joints The more prominent picture is an inflammatory arthritis. There are erosions along the proximal third and fourth metacarpals. Diffuse  soft tissue swelling is present over the dorsum of the hand. There is asymmetric soft tissue swelling in the distal middle finger. The PIP joint of the ring finger is fused. IMPRESSION: 1. 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 2. 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 3. Mild degenerative changes in the DIP joints. Electronically Signed   By: Marin Roberts M.D.   On: 12/21/2015 15:29    Positive ROS: All other systems have been reviewed and were otherwise negative with the exception of those mentioned in the HPI and as above.  Physical Exam: Vitals: Refer to EMR. Constitutional:  WD, WN, NAD HEENT:  NCAT, EOMI Neuro/Psych:  Alert & oriented  Lymphatic: No generalized extremity edema or lymphadenopathy Extremities / MSK:  The upper extremities are normal with respect to appearance, ranges of motion, joint stability, muscle strength/tone, sensation, & perfusion except as otherwise noted:  Left hand with shiny, taut skin over the RF/SF, with disappearance of the normal dorsal transverse skin creases at the PIP and DIP joints.  The LF is enlarged, but not significantly warm or fluctuant.  Enlargement also at MCP regions of IF/LF.  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  Given the degree of diffuse advanced inflammtory changes noted in the soft-tissues and on the xrays of the left hand, I recommend rheumatology evaluation.  Martin Asters Janee Morn, MD      Orthopaedic & Hand Surgery [facility] Orthopaedic & Sports Medicine [facility] [address] [place], [place]  [address] Office: [phone] Mobile: [phone]  12/21/2015, 9:56 PM
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
## 2015-12-21 NOTE — Progress Notes (Signed)
Patient is direct admit for R knee septic to have surgery tomorrow by Dr Thurston HoleWainer . Reported blind and deaf; interpreter accompanying for sign language. Spouse at the bedside is deaf as well. Dr Konrad DoloresMerrell paged and notified of patient location. Awaiting admission orders.

## 2015-12-21 NOTE — Progress Notes (Signed)
   12/21/15 1416  Clinical Encounter Type  Visited With Patient and family together;Health care provider  Visit Type Initial;Spiritual support  Referral From Patient;Nurse  Spiritual Encounters  Spiritual Needs Prayer   Chaplain responded to a request for prayer. Chaplain worked with the intrerpreter to offer prayer and support, and introduce spiritual care services. Chaplain support available as needed.   Alda PonderAdam M Marbeth Smedley, Chaplain 12/21/2015  2:18 PM

## 2015-12-22 ENCOUNTER — Inpatient Hospital Stay (HOSPITAL_COMMUNITY): Payer: Medicare Other | Admitting: Certified Registered Nurse Anesthetist

## 2015-12-22 ENCOUNTER — Encounter (HOSPITAL_COMMUNITY)
Admission: AD | Disposition: A | Discharge status: Home Health | Payer: Self-pay | Source: Ambulatory Visit | Attending: Family Medicine

## 2015-12-22 HISTORY — PX: KNEE ARTHROSCOPY: SHX127

## 2015-12-22 LAB — CULTURE, BODY FLUID-BOTTLE

## 2015-12-22 LAB — CBC
HEMATOCRIT: 32.8 % — AB (ref 39.0–52.0)
HEMOGLOBIN: 10.3 g/dL — AB (ref 13.0–17.0)
MCH: 29.4 pg (ref 26.0–34.0)
MCHC: 31.4 g/dL (ref 30.0–36.0)
MCV: 93.7 fL (ref 78.0–100.0)
Platelets: 170 10*3/uL (ref 150–400)
RBC: 3.5 MIL/uL — ABNORMAL LOW (ref 4.22–5.81)
RDW: 15.2 % (ref 11.5–15.5)
WBC: 4 10*3/uL (ref 4.0–10.5)

## 2015-12-22 LAB — MAGNESIUM: MAGNESIUM: 1.8 mg/dL (ref 1.7–2.4)

## 2015-12-22 LAB — SURGICAL PCR SCREEN
MRSA, PCR: NEGATIVE
STAPHYLOCOCCUS AUREUS: NEGATIVE

## 2015-12-22 LAB — BASIC METABOLIC PANEL
Anion gap: 10 (ref 5–15)
BUN: 22 mg/dL — AB (ref 6–20)
CHLORIDE: 100 mmol/L — AB (ref 101–111)
CO2: 28 mmol/L (ref 22–32)
CREATININE: 1.38 mg/dL — AB (ref 0.61–1.24)
Calcium: 9.3 mg/dL (ref 8.9–10.3)
GFR calc Af Amer: >60 mL/min (ref >60)
GFR calc non Af Amer: 55 mL/min — ABNORMAL LOW (ref >60)
Glucose, Bld: 85 mg/dL (ref 65–99)
Potassium: 4.5 mmol/L (ref 3.5–5.1)
SODIUM: 138 mmol/L (ref 135–145)

## 2015-12-22 LAB — CULTURE, BODY FLUID W GRAM STAIN -BOTTLE: Culture: NO GROWTH

## 2015-12-22 LAB — LIPID PANEL
Cholesterol: 200 mg/dL (ref 0–200)
HDL: 52 mg/dL (ref >40)
LDL CALC: 117 mg/dL — AB (ref 0–99)
Total CHOL/HDL Ratio: 3.8 RATIO
Triglycerides: 153 mg/dL — ABNORMAL HIGH (ref <150)
VLDL: 31 mg/dL (ref 0–40)

## 2015-12-22 LAB — GRAM STAIN

## 2015-12-22 LAB — HEMOGLOBIN A1C
HEMOGLOBIN A1C: 5.9 % — AB (ref 4.8–5.6)
Mean Plasma Glucose: 123 mg/dL

## 2015-12-22 LAB — URIC ACID: URIC ACID, SERUM: 3.8 mg/dL — AB (ref 4.4–7.6)

## 2015-12-22 SURGERY — ARTHROSCOPY, KNEE
Anesthesia: General | Laterality: Right

## 2015-12-22 MED ORDER — FENTANYL CITRATE (PF) 100 MCG/2ML IJ SOLN: 25.0000 ug | INTRAMUSCULAR | Status: DC | PRN

## 2015-12-22 MED ORDER — BUPIVACAINE-EPINEPHRINE (PF) 0.5% -1:200000 IJ SOLN
INTRAMUSCULAR | Status: AC
  Filled 2015-12-22: qty 30

## 2015-12-22 MED ORDER — VANCOMYCIN HCL IN DEXTROSE 1-5 GM/200ML-% IV SOLN
1000.0000 mg | Freq: Once | INTRAVENOUS | Status: AC
  Administered 2015-12-22: 1000 mg via INTRAVENOUS
  Filled 2015-12-22: qty 200

## 2015-12-22 MED ORDER — FENTANYL CITRATE (PF) 250 MCG/5ML IJ SOLN
INTRAMUSCULAR | Status: AC
  Filled 2015-12-22: qty 5

## 2015-12-22 MED ORDER — METHYLPREDNISOLONE ACETATE 80 MG/ML IJ SUSP
INTRAMUSCULAR | Status: DC | PRN
  Administered 2015-12-22: 80 mg via INTRA_ARTICULAR

## 2015-12-22 MED ORDER — BUPIVACAINE-EPINEPHRINE 0.5% -1:200000 IJ SOLN
INTRAMUSCULAR | Status: DC | PRN
  Administered 2015-12-22: 30 mL
  Administered 2015-12-22: 10 mL

## 2015-12-22 MED ORDER — LACTATED RINGERS IV SOLN
INTRAVENOUS | Status: DC
  Administered 2015-12-22 (×2): via INTRAVENOUS

## 2015-12-22 MED ORDER — ONDANSETRON HCL 4 MG/2ML IJ SOLN
INTRAMUSCULAR | Status: DC | PRN
  Administered 2015-12-22: 4 mg via INTRAVENOUS

## 2015-12-22 MED ORDER — SODIUM CHLORIDE 0.9 % IR SOLN
Status: DC | PRN
  Administered 2015-12-22 (×2): 3000 mL

## 2015-12-22 MED ORDER — OXYCODONE HCL 5 MG PO TABS: 5.0000 mg | ORAL_TABLET | Freq: Once | ORAL | Status: DC | PRN

## 2015-12-22 MED ORDER — OXYCODONE HCL 5 MG/5ML PO SOLN: 5.0000 mg | Freq: Once | ORAL | Status: DC | PRN

## 2015-12-22 MED ORDER — PROMETHAZINE HCL 25 MG/ML IJ SOLN: 6.2500 mg | INTRAMUSCULAR | Status: DC | PRN

## 2015-12-22 MED ORDER — LIDOCAINE HCL (CARDIAC) 20 MG/ML IV SOLN
INTRAVENOUS | Status: DC | PRN
  Administered 2015-12-22: 100 mg via INTRATRACHEAL

## 2015-12-22 MED ORDER — GLYCOPYRROLATE 0.2 MG/ML IJ SOLN
INTRAMUSCULAR | Status: AC
  Filled 2015-12-22: qty 1

## 2015-12-22 MED ORDER — CEFAZOLIN SODIUM-DEXTROSE 2-3 GM-% IV SOLR
INTRAVENOUS | Status: AC
  Filled 2015-12-22: qty 50

## 2015-12-22 MED ORDER — DEXTROSE 5 % IV SOLN
2.0000 g | INTRAVENOUS | Status: DC
  Administered 2015-12-22 – 2015-12-25 (×4): 2 g via INTRAVENOUS
  Filled 2015-12-22 (×4): qty 2

## 2015-12-22 MED ORDER — ONDANSETRON HCL 4 MG/2ML IJ SOLN
INTRAMUSCULAR | Status: AC
  Filled 2015-12-22: qty 2

## 2015-12-22 MED ORDER — PROPOFOL 10 MG/ML IV BOLUS
INTRAVENOUS | Status: DC | PRN
  Administered 2015-12-22: 180 mg via INTRAVENOUS

## 2015-12-22 MED ORDER — CEFAZOLIN SODIUM-DEXTROSE 2-3 GM-% IV SOLR
INTRAVENOUS | Status: DC | PRN
  Administered 2015-12-22: 2 g via INTRAVENOUS

## 2015-12-22 MED ORDER — CEFAZOLIN SODIUM-DEXTROSE 2-3 GM-% IV SOLR
2.0000 g | Freq: Four times a day (QID) | INTRAVENOUS | Status: AC
  Administered 2015-12-22 – 2015-12-23 (×3): 2 g via INTRAVENOUS
  Filled 2015-12-22 (×3): qty 50

## 2015-12-22 MED ORDER — SODIUM CHLORIDE 0.9 % IV SOLN
INTRAVENOUS | Status: DC
  Administered 2015-12-22 – 2015-12-23 (×2): via INTRAVENOUS

## 2015-12-22 MED ORDER — LIDOCAINE HCL (CARDIAC) 20 MG/ML IV SOLN
INTRAVENOUS | Status: AC
  Filled 2015-12-22: qty 5

## 2015-12-22 MED ORDER — COLCHICINE 0.6 MG PO TABS
0.6000 mg | ORAL_TABLET | Freq: Every day | ORAL | Status: DC
  Administered 2015-12-22 – 2015-12-25 (×3): 0.6 mg via ORAL
  Filled 2015-12-22 (×4): qty 1

## 2015-12-22 MED ORDER — GLYCOPYRROLATE 0.2 MG/ML IJ SOLN
INTRAMUSCULAR | Status: DC | PRN
  Administered 2015-12-22: 0.2 mg via INTRAVENOUS

## 2015-12-22 MED ORDER — BISACODYL 10 MG RE SUPP
10.0000 mg | Freq: Every day | RECTAL | Status: DC
  Filled 2015-12-22 (×2): qty 1

## 2015-12-22 MED ORDER — VANCOMYCIN HCL 500 MG IV SOLR
500.0000 mg | Freq: Two times a day (BID) | INTRAVENOUS | Status: DC
  Administered 2015-12-22 – 2015-12-25 (×6): 500 mg via INTRAVENOUS
  Filled 2015-12-22 (×7): qty 500

## 2015-12-22 MED ORDER — EPHEDRINE SULFATE 50 MG/ML IJ SOLN
INTRAMUSCULAR | Status: DC | PRN
  Administered 2015-12-22: 15 mg via INTRAVENOUS
  Administered 2015-12-22: 10 mg via INTRAVENOUS
  Administered 2015-12-22: 15 mg via INTRAVENOUS
  Administered 2015-12-22: 10 mg via INTRAVENOUS

## 2015-12-22 MED ORDER — PROPOFOL 10 MG/ML IV BOLUS
INTRAVENOUS | Status: AC
  Filled 2015-12-22: qty 20

## 2015-12-22 MED ORDER — METHYLPREDNISOLONE ACETATE 80 MG/ML IJ SUSP
INTRAMUSCULAR | Status: AC
  Filled 2015-12-22: qty 1

## 2015-12-22 MED ORDER — FENTANYL CITRATE (PF) 250 MCG/5ML IJ SOLN
INTRAMUSCULAR | Status: DC | PRN
  Administered 2015-12-22 (×2): 25 ug via INTRAVENOUS

## 2015-12-22 SURGICAL SUPPLY — 42 items
BANDAGE ACE 6X5 VEL STRL LF (GAUZE/BANDAGES/DRESSINGS) ×3 IMPLANT
BANDAGE ELASTIC 6 VELCRO ST LF (GAUZE/BANDAGES/DRESSINGS) IMPLANT
BANDAGE ESMARK 6X9 LF (GAUZE/BANDAGES/DRESSINGS) IMPLANT
BLADE SURG ROTATE 9660 (MISCELLANEOUS) IMPLANT
BNDG ESMARK 6X9 LF (GAUZE/BANDAGES/DRESSINGS)
COVER SURGICAL LIGHT HANDLE (MISCELLANEOUS) ×3 IMPLANT
CUFF TOURNIQUET SINGLE 34IN LL (TOURNIQUET CUFF) IMPLANT
CUTTER MENISCUS 3.5MM 6/BX (BLADE) ×3 IMPLANT
DRAPE ARTHROSCOPY W/POUCH 114 (DRAPES) ×3 IMPLANT
DRAPE U-SHAPE 47X51 STRL (DRAPES) ×3 IMPLANT
DRSG PAD ABDOMINAL 8X10 ST (GAUZE/BANDAGES/DRESSINGS) IMPLANT
FACESHIELD WRAPAROUND (MASK) ×3 IMPLANT
GAUZE SPONGE 4X4 12PLY STRL (GAUZE/BANDAGES/DRESSINGS) IMPLANT
GAUZE XEROFORM 1X8 LF (GAUZE/BANDAGES/DRESSINGS) ×3 IMPLANT
GLOVE BIO SURGEON STRL SZ7 (GLOVE) ×3 IMPLANT
GLOVE BIO SURGEON STRL SZ7.5 (GLOVE) ×3 IMPLANT
GLOVE BIOGEL PI IND STRL 7.0 (GLOVE) ×1 IMPLANT
GLOVE BIOGEL PI IND STRL 8 (GLOVE) ×1 IMPLANT
GLOVE BIOGEL PI INDICATOR 7.0 (GLOVE) ×2
GLOVE BIOGEL PI INDICATOR 8 (GLOVE) ×2
GOWN STRL REUS W/ TWL LRG LVL3 (GOWN DISPOSABLE) ×2 IMPLANT
GOWN STRL REUS W/ TWL XL LVL3 (GOWN DISPOSABLE) ×1 IMPLANT
GOWN STRL REUS W/TWL LRG LVL3 (GOWN DISPOSABLE) ×4
GOWN STRL REUS W/TWL XL LVL3 (GOWN DISPOSABLE) ×2
KIT ROOM TURNOVER OR (KITS) ×3 IMPLANT
MANIFOLD NEPTUNE II (INSTRUMENTS) IMPLANT
NS IRRIG 1000ML POUR BTL (IV SOLUTION) IMPLANT
PACK ARTHROSCOPY DSU (CUSTOM PROCEDURE TRAY) ×3 IMPLANT
PAD ABD 8X10 STRL (GAUZE/BANDAGES/DRESSINGS) ×3 IMPLANT
PAD ARMBOARD 7.5X6 YLW CONV (MISCELLANEOUS) ×6 IMPLANT
PADDING CAST COTTON 6X4 STRL (CAST SUPPLIES) IMPLANT
SET ARTHROSCOPY TUBING (MISCELLANEOUS) ×2
SET ARTHROSCOPY TUBING LN (MISCELLANEOUS) ×1 IMPLANT
SPONGE GAUZE 4X4 12PLY STER LF (GAUZE/BANDAGES/DRESSINGS) ×3 IMPLANT
SPONGE LAP 4X18 X RAY DECT (DISPOSABLE) ×3 IMPLANT
SUT ETHILON 2 0 FS 18 (SUTURE) IMPLANT
SUT ETHILON 3 0 PS 1 (SUTURE) ×3 IMPLANT
TOWEL OR 17X24 6PK STRL BLUE (TOWEL DISPOSABLE) ×3 IMPLANT
TOWEL OR 17X26 10 PK STRL BLUE (TOWEL DISPOSABLE) ×3 IMPLANT
TUBE CONNECTING 12'X1/4 (SUCTIONS) ×1
TUBE CONNECTING 12X1/4 (SUCTIONS) ×2 IMPLANT
WATER STERILE IRR 1000ML POUR (IV SOLUTION) ×3 IMPLANT

## 2015-12-22 NOTE — Consult Note (Signed)
[facility]    Date of Admission:  12/21/2015   Total days of antibiotics 2        Ceftriaxone 3/14>>        Vancomycin 3/14>>        Doxycycline 3/13>>3/13       Reason for Consult: right knee pain and swelling    Referring Physician: Dr. Susa Raring Primary Care Physician: [facility]  Active Problems:   Septic joint [facility])   Chronic pain syndrome   Legally blind   Deaf   Hypothyroidism   Swelling of left hand   Constipation   . [MAR Hold] bisacodyl  10 mg Rectal Daily  . [MAR Hold] cefTRIAXone (ROCEPHIN)  IV  2 g Intravenous Q24H  . [MAR Hold] colchicine  0.6 mg Oral Daily  . [MAR Hold] dorzolamide-timolol  1 drop Both Eyes BID  . [MAR Hold] feeding supplement (ENSURE ENLIVE)  237 mL Oral BID BM  . [MAR Hold] gabapentin  300 mg Oral TID  . [MAR Hold] heparin  5,000 Units Subcutaneous Once  . Influenza vac split quadrivalent PF  0.5 mL Intramuscular Tomorrow-1000  . [MAR Hold] latanoprost  1 drop Both Eyes QHS  . [MAR Hold] levothyroxine  125 mcg Oral QAC breakfast  . [MAR Hold] senna  2 tablet Oral BID  . [MAR Hold] vancomycin  500 mg Intravenous Q12H    Recommendations: 1. Continue ceftriaxone and vancomycin pending intraoperative culture data. 2. Will plan to follow up in the morning on 3/15.   Assessment: Martin [name] is a 60 year old man with suspected septic arthritis currently undergoing arthroscopic washout with orthopedic surgery.  Initial aspiration of joint on 12/17/15 showed >65,000 WBC and 91% neutrophils.  He was started on doxycycline.  His pain worsened again after re-accumulation of fluid.  He does not have a leukocytosis and has been afebrile.  Gram stain and culture from 12/17/15 showed no organisms.  He is currently on vancomycin and ceftriaxone.  Many pathogens are capable of causing bacterial arthritis. Organisms such as Staph aureus and streptococci have a higher propensity to cause joint infections than  gram-negative bacilli, which typically cause infections following trauma or in patients with severe underlying immunosuppression.    HPI: Martin [name] is a 60 y.o. male who is reportedly deaf and blind with history of gout, hypothyroidism, chronic constipation.  He is presently in the OR having washout of his right knee so history obtained from review of chart.  He apparently has had pain and swelling intermittently for 3 weeks that has progressed to be constant.  Has not tried any medications for relief but has occasionally used ice without much benefit.  Pain is worse with ambulation.  He was seen in the ED on 12/17/15 for same knee pain.  Imaging showed a moderate joint effusion.  He had his knee aspirated and fluid analysis showed >65,000 WBC with 91% neutrophils.  Gram stain did not show any organisms and final cultures had no growth.  It was felt at time of discharge from ED that he had inflammatory or septic arthritis based on fluid analysis.  He was started on Doxycycline with plans to follow up with orthopedics the following day.  His pain returned with accumulation of fluid.  On this admission H&P, he denied fever, nausea, vomiting, rash.  He has also reported left hand swelling started after his knee pain developed.  Imaging of right hand showed changes  compatible with a diffuse inflammatory arthritis.  Hand surgery (Dr. Janee Morn) felt this was most likely inflammatory arthritis of left hand with no surgical indication presently.  Rheumatology evaluation was recommended.  Orthopedic surgery (Dr. Eulah Pont) evaluated patient for knee pain.  Orthopedics felt that right knee represented possible septic joint and so patient was taken to OR for arthroscopic I&D.   Review of Systems: Review of Systems  Unable to perform ROS   Past Medical History  Diagnosis Date  . Deaf   . Legally blind   . Gout   . Hypothyroidism   . Chronic constipation     Social History  Substance Use Topics  . Smoking  status: Never Smoker   . Smokeless tobacco: Never Used  . Alcohol Use: No    Family History  Problem Relation Age of Onset  . Family history unknown: Yes   No Known Allergies  OBJECTIVE: Blood pressure 125/75, pulse 54, temperature 98 F (36.7 C), temperature source Oral, resp. rate 18, height 6' (1.829 m), weight 114 lb (51.71 kg), SpO2 100 %.  Physical Exam Unable to perform as patient is in the OR.  Lab Results Lab Results  Component Value Date   WBC 4.0 12/22/2015   HGB 10.3* 12/22/2015   HCT 32.8* 12/22/2015   MCV 93.7 12/22/2015   PLT 170 12/22/2015    Lab Results  Component Value Date   CREATININE 1.38* 12/22/2015   BUN 22* 12/22/2015   NA 138 12/22/2015   K 4.5 12/22/2015   CL 100* 12/22/2015   CO2 28 12/22/2015    Lab Results  Component Value Date   ALT 37 12/21/2015   AST 48* 12/21/2015   ALKPHOS 76 12/21/2015   BILITOT 0.6 12/21/2015     Microbiology: Recent Results (from the past 240 hour(s))  Culture, body fluid-bottle     Status: None   Collection Time: 12/17/15  2:54 PM  Result Value Ref Range Status   Specimen Description SYNOVIAL RIGHT KNEE  Final   Special Requests BOTTLES DRAWN AEROBIC AND ANAEROBIC 5CC  Final   Culture   Final    NO GROWTH 5 DAYS Performed at [facility]    Report Status 12/22/2015 FINAL  Final  Gram stain     Status: None   Collection Time: 12/17/15  2:54 PM  Result Value Ref Range Status   Specimen Description SYNOVIAL  Final   Special Requests NONE  Final   Gram Stain   Final    ABUNDANT WBC PRESENT, PREDOMINANTLY PMN NO ORGANISMS SEEN Performed at [facility]    Report Status 12/17/2015 FINAL  Final  Surgical pcr screen     Status: None   Collection Time: 12/22/15  2:25 AM  Result Value Ref Range Status   MRSA, PCR NEGATIVE NEGATIVE Final   Staphylococcus aureus NEGATIVE NEGATIVE Final    Comment:        The Xpert SA Assay (FDA approved for NASAL specimens in patients over 21 years of  age), is one component of a comprehensive surveillance program.  Test performance has been validated by [facility] for patients greater than or equal to 72 year old. It is not intended to diagnose infection nor to guide or monitor treatment.     Gwynn Burly, DO   12/22/2015, 3:22 PM  Addendum: The sign language interpreter was no longer available when I came to see Martin [name]. I will follow-up tomorrow.  Cliffton Asters, MD [facility] for Infectious Disease [facility] [phone] pager   [phone] 12/23/2015, 4:36 PM

## 2015-12-22 NOTE — Consult Note (Signed)
ORTHOPAEDIC CONSULTATION  REQUESTING PHYSICIAN: Thurnell Lose, MD  Chief Complaint: right knee infection   HPI: Martin [name] is a 60 y.o. male who complains of  2wks of R knee pain with swelling and warmth. Aspiration showed 60K plus WBC. Pain has worsened after fluid reacumulated.   Past Medical History  Diagnosis Date  . Deaf   . Legally blind   . Gout   . Hypothyroidism   . Chronic constipation    Past Surgical History  Procedure Laterality Date  . Knee surgery Left 2015/2016  . Eye surgery Right     "relieved fluid pressure and cleaned it out"   Social History   Social History  . Marital Status: Married    Spouse Name: N/A  . Number of Children: N/A  . Years of Education: N/A   Social History Main Topics  . Smoking status: Never Smoker   . Smokeless tobacco: Never Used  . Alcohol Use: No  . Drug Use: No  . Sexual Activity: Yes   Other Topics Concern  . None   Social History Narrative   Family History  Problem Relation Age of Onset  . Family history unknown: Yes   No Known Allergies Prior to Admission medications   Medication Sig Start Date End Date Taking? Authorizing Provider  bimatoprost (LUMIGAN) 0.01 % SOLN Place 1 drop into both eyes at bedtime.   Yes Historical Provider, MD  diclofenac (VOLTAREN) 75 MG EC tablet Take 75 mg by mouth 2 (two) times daily.   Yes Historical Provider, MD  DORZOLAMIDE HCL-TIMOLOL MAL OP Apply 1 drop to eye 2 (two) times daily.   Yes Historical Provider, MD  doxycycline (VIBRAMYCIN) 100 MG capsule Take 1 capsule (100 mg total) by mouth 2 (two) times daily. 12/17/15  Yes Christopher Lawyer, PA-C  gabapentin (NEURONTIN) 300 MG capsule Take 300 mg by mouth 3 (three) times daily.   Yes Historical Provider, MD  HYDROcodone-acetaminophen (NORCO/VICODIN) 5-325 MG tablet Take 1 tablet by mouth every 6 (six) hours as needed for moderate pain. 12/17/15  Yes Christopher Lawyer, PA-C  levothyroxine (SYNTHROID, LEVOTHROID) 125 MCG  tablet Take 125 mcg by mouth daily before breakfast.   Yes Historical Provider, MD  Multiple Vitamin (MULTIVITAMIN WITH MINERALS) TABS tablet Take 1 tablet by mouth daily.   Yes Historical Provider, MD  ondansetron (ZOFRAN) 4 MG tablet Take 1 tablet (4 mg total) by mouth every 8 (eight) hours as needed for nausea or vomiting. 08/04/15  Yes Domenic Moras, PA-C  oxyCODONE-acetaminophen (PERCOCET/ROXICET) 5-325 MG tablet Take 1 tablet by mouth every 6 (six) hours as needed for severe pain. 12/17/15  Yes Christopher Lawyer, PA-C  senna (SENOKOT) 8.6 MG TABS tablet Take 1 tablet by mouth 2 (two) times daily as needed for mild constipation.   Yes Historical Provider, MD   Abd 1 View (kub)  12/21/2015  CLINICAL DATA:  Abdominal pain and constipation for 2 days. EXAM: ABDOMEN - 1 VIEW COMPARISON:  CT scan of August 03, 2015. FINDINGS: No small bowel dilatation is noted. Severe colonic distention is noted which was present on prior CT scan. This most likely represents adynamic ileus. IMPRESSION: Stable probable chronic colonic distention compared to prior exam. This may represent adynamic ileus. Electronically Signed   By: Marijo Conception, M.D.   On: 12/21/2015 15:23   Dg Hand Complete Left  12/21/2015  CLINICAL DATA:  Left hand pain for 1 week. EXAM: LEFT HAND - COMPLETE 3+ VIEW COMPARISON:  None.  FINDINGS: Mild degenerative changes are noted in the DIP joints The more prominent picture is an inflammatory arthritis. There are erosions along the proximal third and fourth metacarpals. Diffuse soft tissue swelling is present over the dorsum of the hand. There is asymmetric soft tissue swelling in the distal middle finger. The PIP joint of the ring finger is fused. IMPRESSION: 1. 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 2. 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 3. Mild degenerative changes in the DIP joints. Electronically Signed   By:  San Morelle M.D.   On: 12/21/2015 15:29    Positive ROS: All other systems have been reviewed and were otherwise negative with the exception of those mentioned in the HPI and as above.  Labs cbc  Recent Labs  12/21/15 1551 12/22/15 0540  WBC 3.7* 4.0  HGB 10.9* 10.3*  HCT 33.5* 32.8*  PLT PLATELET CLUMPS NOTED ON SMEAR, COUNT APPEARS ADEQUATE 170    Labs inflam No results for input(s): CRP in the last 72 hours.  Invalid input(s): ESR  Labs coag  Recent Labs  12/21/15 1551  INR 1.10     Recent Labs  12/21/15 1551 12/22/15 0540  NA 141 138  K 5.1 4.5  CL 104 100*  CO2 26 28  GLUCOSE 87 85  BUN 19 22*  CREATININE 1.12 1.38*  CALCIUM 9.7 9.3    Physical Exam: Filed Vitals:   12/22/15 0044 12/22/15 0500  BP: 105/71 104/79  Pulse: 60 65  Temp: 98 F (36.7 C) 97.8 F (36.6 C)  Resp:     General: Alert, no acute distress Cardiovascular: No pedal edema Respiratory: No cyanosis, no use of accessory musculature GI: No organomegaly, abdomen is soft and non-tender Skin: No lesions in the area of chief complaint other than those listed below in MSK exam.  Neurologic: Sensation intact distally save for the below mentioned MSK exam Psychiatric: Patient is competent for consent with normal mood and affect Lymphatic: No axillary or cervical lymphadenopathy  MUSCULOSKELETAL:  RLE: compartments soft, large knee effusion, NVI distally Other extremities are atraumatic with painless ROM and NVI.  Assessment: R knee possible septic knee  Plan: Level of cells in aspiration indicates infection so will perform arthroscopic I&D.  ID consult Hand consult   Renette Butters, MD Cell [phone]   12/22/2015 11:31 AM

## 2015-12-22 NOTE — Progress Notes (Signed)
Chaplain presented to the Stay Area to see patient preparing for surgery.  The patient is deaf but had interpreters with him for communication purposes. Chaplain introduced self, and informed him I was there to provide spiritual care support, and to have a prayer with him if he desired prior to his going to surgery. He was appreciative of the support. Chaplain also inquired if fthere was any family or other support here with him, and it was reported that his wife who is also deaf if here in 5N09 the room the patient was in prior to surgery and will return to after surgery. Chaplain presented to the wife showed her my hospital badge for introduction purposes. Chaplain inquired internal to the hospital  for interpreters services, there was not at the time of this note. Chaplain will continue to follow for spiritual care support. Chaplain Janell QuietAudrey Kye Hedden [phone]

## 2015-12-22 NOTE — Progress Notes (Signed)
Initial Nutrition Assessment  DOCUMENTATION CODES:   Severe malnutrition in context of chronic illness, Underweight  INTERVENTION:  Once diet advances, continue Ensure Enlive po BID, each supplement provides 350 kcal and 20 grams of protein.  NUTRITION DIAGNOSIS:   Malnutrition related to chronic illness as evidenced by severe depletion of body fat, severe depletion of muscle mass.  GOAL:   Patient will meet greater than or equal to 90% of their needs  MONITOR:   Diet advancement, Supplement acceptance, Weight trends, Labs, I & O's, Skin  REASON FOR ASSESSMENT:   Malnutrition Screening Tool    ASSESSMENT:   60 y.o. male , deaf and legally blind, who was admitted  to the hospital preoperatively for an arthroscopic washout of his knee.  RD interview via Molli HazardMatthew (scheduled interpreter) during time of visit. Pt reports eating well PTA with at least 3 meals a day. Usual body weight reported to be ~114 lbs. Pt is currently NPO for procedure today. Ensure already has been ordered. RD to continue with orders.   Nutrition-Focused physical exam completed. Findings are severe fat depletion, severe muscle depletion, and no edema.   Labs and medications reviewed.    Diet Order:  Diet NPO time specified  Skin:  Reviewed, no issues  Last BM:  3/13  Height:   Ht Readings from Last 1 Encounters:  12/22/15 6' (1.829 m)    Weight:   Wt Readings from Last 1 Encounters:  12/22/15 114 lb (51.71 kg)    Ideal Body Weight:  80.9 kg  BMI:  Body mass index is 15.46 kg/(m^2).  Estimated Nutritional Needs:   Kcal:  1850-2050  Protein:  85-100 grams  Fluid:  1.8 - 2 L/day  EDUCATION NEEDS:   No education needs identified at this time  Roslyn SmilingStephanie Saidi Santacroce, MS, RD, LDN Pager # [phone] After hours/ weekend pager # [phone]

## 2015-12-22 NOTE — Progress Notes (Signed)
Patient Demographics:    Martin [name], is a 60 y.o. male, DOB - 1956-08-17, ZOX:[id number]  Admit date - 12/21/2015   Admitting Physician Ozella Rocks, MD  Outpatient Primary MD for the patient is [facility]  LOS - 1   No chief complaint on file.       Subjective:    Loyal Holzheimer today has, No headache, No chest pain, No abdominal pain - No Nausea, No new weakness tingling or numbness, No Cough - SOB. +ve R Knee pain, L hand pain is better. Review of systems and history obtained by the help of a sign language interpreter.   Assessment  & Plan :     1. R knee Septic Arthritis - Fluids show no crystals with extremely high WBC count which is neutrophil predominant, switched to vancomycin and IV Rocephin, orthopedic onboard taking the patient to OR for incision and drainage on 12/22/2015. I have also requested ID physician Dr. Orvan Falconer to evaluate the patient. Note no blood cultures were drawn on admission, we will wait for perioperative wound culture and results. Gram stain and wound culture from outpatient studies on 12/17/2015 are negative so far.  2. Left hand diffuse swelling. X-ray suggestive of inflammatory arthritis, seen by hand surgeon Dr. Janee Morn who recommends no intervention, will check rheumatoid factor, ANA, uric acid level, could have gouty arthritis or psoriatic arthritis, for now colchicine, if cleared by ID Will give a trial of systemic IV steroids and monitor. Likely require outpatient rheumatology follow-up.  3. Deaf and legally blind. Sign language interpreter utilized with good effect, supportive care.  4. Chronic ileus. No nausea or abdominal pain. Continue bowel regimen.  5. ARF. Hold NSAID for now, hydrate and monitor.  6. Hypothyroidism. On Synthroid continue.   Code  Status : Full  Family Communication  : Girl friend    Disposition Plan  : Stay inpatient  Consults  :  Arlys John, Hand Surgeon - Janee Morn, ID Dr Orvan Falconer  Procedures  :     DVT Prophylaxis  : Heparin   Lab Results  Component Value Date   PLT 170 12/22/2015    Inpatient Medications  Scheduled Meds: . bisacodyl  10 mg Rectal Daily  . cefTRIAXone (ROCEPHIN)  IV  2 g Intravenous Q24H  . colchicine  0.6 mg Oral Daily  . dorzolamide-timolol  1 drop Both Eyes BID  . feeding supplement (ENSURE ENLIVE)  237 mL Oral BID BM  . gabapentin  300 mg Oral TID  . heparin  5,000 Units Subcutaneous Once  . Influenza vac split quadrivalent PF  0.5 mL Intramuscular Tomorrow-1000  . latanoprost  1 drop Both Eyes QHS  . levothyroxine  125 mcg Oral QAC breakfast  . senna  2 tablet Oral BID  . vancomycin  500 mg Intravenous Q12H   Continuous Infusions: . sodium chloride     PRN Meds:.morphine injection, [DISCONTINUED] ondansetron **OR** ondansetron (ZOFRAN) IV, oxyCODONE-acetaminophen  Antibiotics  :   Anti-infectives    Start     Dose/Rate Route Frequency Ordered Stop   12/22/15 2200  vancomycin (VANCOCIN) 500 mg in sodium chloride 0.9 % 100 mL IVPB     500 mg 100 mL/hr over 60 Minutes Intravenous Every 12 hours 12/22/15 1018  12/22/15 0800  cefTRIAXone (ROCEPHIN) 2 g in dextrose 5 % 50 mL IVPB     2 g 100 mL/hr over 30 Minutes Intravenous Every 24 hours 12/22/15 0732     12/22/15 0745  vancomycin (VANCOCIN) IVPB 1000 mg/200 mL premix     1,000 mg 200 mL/hr over 60 Minutes Intravenous  Once 12/22/15 0744 12/22/15 1050   12/21/15 2200  doxycycline (VIBRA-TABS) tablet 100 mg  Status:  Discontinued     100 mg Oral 2 times daily 12/21/15 1247 12/22/15 0732        Objective:   Filed Vitals:   12/21/15 2300 12/22/15 0044 12/22/15 0500 12/22/15 1000  BP: 94/82 105/71 104/79   Pulse: 74 60 65   Temp: 98.2 F (36.8 C) 98 F (36.7 C) 97.8 F (36.6 C)   TempSrc: Oral Oral Oral    Resp: 16     Height:    6' (1.829 m)  Weight:    51.71 kg (114 lb)  SpO2: 100% 98% 100%     Wt Readings from Last 3 Encounters:  12/22/15 51.71 kg (114 lb)     Intake/Output Summary (Last 24 hours) at 12/22/15 1221 Last data filed at 12/22/15 1100  Gross per 24 hour  Intake 1068.75 ml  Output    250 ml  Net 818.75 ml     Physical Exam  Awake Alert, Oriented X 3, No new F.N deficits, Normal affect [place].AT,PERRAL Supple Neck,No JVD, No cervical lymphadenopathy appriciated.  Symmetrical Chest wall movement, Good air movement bilaterally, CTAB RRR,No Gallops,Rubs or new Murmurs, No Parasternal Heave +ve B.Sounds, Abd Soft, No tenderness, No organomegaly appriciated, No rebound - guarding or rigidity. No Cyanosis, Clubbing or edema, No new Rash or bruise  R Knee in immobilizers, L hand mildly swollen diffusely    Data Review:   Micro Results Recent Results (from the past 240 hour(s))  Culture, body fluid-bottle     Status: None   Collection Time: 12/17/15  2:54 PM  Result Value Ref Range Status   Specimen Description SYNOVIAL RIGHT KNEE  Final   Special Requests BOTTLES DRAWN AEROBIC AND ANAEROBIC 5CC  Final   Culture   Final    NO GROWTH 5 DAYS Performed at [facility]    Report Status 12/22/2015 FINAL  Final  Gram stain     Status: None   Collection Time: 12/17/15  2:54 PM  Result Value Ref Range Status   Specimen Description SYNOVIAL  Final   Special Requests NONE  Final   Gram Stain   Final    ABUNDANT WBC PRESENT, PREDOMINANTLY PMN NO ORGANISMS SEEN Performed at [facility]    Report Status 12/17/2015 FINAL  Final  Surgical pcr screen     Status: None   Collection Time: 12/22/15  2:25 AM  Result Value Ref Range Status   MRSA, PCR NEGATIVE NEGATIVE Final   Staphylococcus aureus NEGATIVE NEGATIVE Final    Comment:        The Xpert SA Assay (FDA approved for NASAL specimens in patients over 58 years of age), is one component of a  comprehensive surveillance program.  Test performance has been validated by [facility] for patients greater than or equal to 58 year old. It is not intended to diagnose infection nor to guide or monitor treatment.     Radiology Reports Abd 1 View (kub)  12/21/2015  CLINICAL DATA:  Abdominal pain and constipation for 2 days. EXAM: ABDOMEN - 1 VIEW COMPARISON:  CT scan of August 03, 2015. FINDINGS: No small bowel dilatation is noted. Severe colonic distention is noted which was present on prior CT scan. This most likely represents adynamic ileus. IMPRESSION: Stable probable chronic colonic distention compared to prior exam. This may represent adynamic ileus. Electronically Signed   By: Lupita Raider, M.D.   On: 12/21/2015 15:23   Dg Knee Complete 4 Views Right  12/17/2015  CLINICAL DATA:  Bilateral leg pain, history of gout, pain and swelling right knee for 2 days EXAM: RIGHT KNEE - COMPLETE 4+ VIEW COMPARISON:  None. FINDINGS: Five views of the right knee submitted. No acute fracture or subluxation. Mild narrowing of medial joint compartment. There is old fracture deformity proximal shaft of right fibula. Moderate joint effusion. IMPRESSION: No acute fracture or subluxation. Mild degenerative changes. Moderate joint effusion. Old fracture of proximal shaft of the right fibula. Electronically Signed   By: Natasha Mead M.D.   On: 12/17/2015 13:36   Dg Hand Complete Left  12/21/2015  CLINICAL DATA:  Left hand pain for 1 week. EXAM: LEFT HAND - COMPLETE 3+ VIEW COMPARISON:  None. FINDINGS: Mild degenerative changes are noted in the DIP joints The more prominent picture is an inflammatory arthritis. There are erosions along the proximal third and fourth metacarpals. Diffuse soft tissue swelling is present over the dorsum of the hand. There is asymmetric soft tissue swelling in the distal middle finger. The PIP joint of the ring finger is fused. IMPRESSION: 1. Changes compatible with a diffuse  inflammatory arthritis, most likely rheumatoid or possibly psoriatic arthritis. 2. 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 3. Mild degenerative changes in the DIP joints. Electronically Signed   By: Marin Roberts M.D.   On: 12/21/2015 15:29     CBC  Recent Labs Lab 12/21/15 1551 12/22/15 0540  WBC 3.7* 4.0  HGB 10.9* 10.3*  HCT 33.5* 32.8*  PLT PLATELET CLUMPS NOTED ON SMEAR, COUNT APPEARS ADEQUATE 170  MCV 92.8 93.7  MCH 30.2 29.4  MCHC 32.5 31.4  RDW 14.9 15.2  LYMPHSABS 1.1  --   MONOABS 0.3  --   EOSABS 0.1  --   BASOSABS 0.0  --     Chemistries   Recent Labs Lab 12/21/15 1551 12/22/15 0540 12/22/15 0753  NA 141 138  --   K 5.1 4.5  --   CL 104 100*  --   CO2 26 28  --   GLUCOSE 87 85  --   BUN 19 22*  --   CREATININE 1.12 1.38*  --   CALCIUM 9.7 9.3  --   MG 2.0  --  1.8  AST 48*  --   --   ALT 37  --   --   ALKPHOS 76  --   --   BILITOT 0.6  --   --    ------------------------------------------------------------------------------------------------------------------  Recent Labs  12/22/15 0540  CHOL 200  HDL 52  LDLCALC 117*  TRIG 153*  CHOLHDL 3.8    Lab Results  Component Value Date   HGBA1C 5.9* 12/21/2015   ------------------------------------------------------------------------------------------------------------------ No results for input(s): TSH, T4TOTAL, T3FREE, THYROIDAB in the last 72 hours.  Invalid input(s): FREET3 ------------------------------------------------------------------------------------------------------------------ No results for input(s): VITAMINB12, FOLATE, FERRITIN, TIBC, IRON, RETICCTPCT in the last 72 hours.  Coagulation profile  Recent Labs Lab 12/21/15 1551  INR 1.10    No results for input(s): DDIMER in the last 72 hours.  Cardiac Enzymes No results for input(s):  CKMB, TROPONINI, MYOGLOBIN in the last 168 hours.  Invalid input(s):  CK ------------------------------------------------------------------------------------------------------------------ No results found for: BNP  Time Spent in minutes  35   Susa Raring K M.D on 12/22/2015 at 12:21 PM  Between 7am to 7pm - Pager - [phone]  After 7pm go to [email or web address] - password [email or web address]  [facility] -  Office  [phone]

## 2015-12-22 NOTE — Anesthesia Procedure Notes (Signed)
Procedure Name: LMA Insertion Date/Time: 12/22/2015 2:50 PM Performed by: Salomon MastWALL, Bradey Luzier COREY Pre-anesthesia Checklist: Patient identified, Emergency Drugs available, Suction available and Patient being monitored Patient Re-evaluated:Patient Re-evaluated prior to inductionOxygen Delivery Method: Circle system utilized Preoxygenation: Pre-oxygenation with 100% oxygen Intubation Type: IV induction LMA: LMA inserted LMA Size: 4.0 Tube type: Oral Number of attempts: 1 Placement Confirmation: positive ETCO2 and breath sounds checked- equal and bilateral Tube secured with: Tape Dental Injury: Teeth and Oropharynx as per pre-operative assessment

## 2015-12-22 NOTE — Anesthesia Preprocedure Evaluation (Addendum)
Anesthesia Evaluation  Patient identified by MRN, date of birth, ID band Patient awake    Reviewed: Allergy & Precautions, H&P , NPO status , Patient's Chart, lab work & pertinent test results  History of Anesthesia Complications Negative for: history of anesthetic complications  Airway Mallampati: II  TM Distance: >3 FB Neck ROM: full    Dental no notable dental hx.    Pulmonary neg pulmonary ROS,    Pulmonary exam normal breath sounds clear to auscultation       Cardiovascular negative cardio ROS Normal cardiovascular exam Rhythm:regular Rate:Normal     Neuro/Psych negative neurological ROS     GI/Hepatic negative GI ROS, Neg liver ROS,   Endo/Other  Hypothyroidism   Renal/GU negative Renal ROS     Musculoskeletal  (+) Arthritis ,   Abdominal   Peds  Hematology negative hematology ROS (+)   Anesthesia Other Findings Patient is mostly blind and deaf, consent obtained through the use of an interpreter  Denies cardiac or pulmonary history, NPO appropriate  Reproductive/Obstetrics negative OB ROS                            Anesthesia Physical Anesthesia Plan  ASA: III  Anesthesia Plan: General   Post-op Pain Management:    Induction: Intravenous  Airway Management Planned: LMA  Additional Equipment:   Intra-op Plan:   Post-operative Plan: Extubation in OR  Informed Consent: I have reviewed the patients History and Physical, chart, labs and discussed the procedure including the risks, benefits and alternatives for the proposed anesthesia with the patient or authorized representative who has indicated his/her understanding and acceptance.   Dental Advisory Given  Plan Discussed with: Anesthesiologist, CRNA and Surgeon  Anesthesia Plan Comments:        Anesthesia Quick Evaluation

## 2015-12-22 NOTE — Op Note (Signed)
12/21/2015 - 12/22/2015  3:56 PM  PATIENT:  Martin [name]    PRE-OPERATIVE DIAGNOSIS:  Right Knee Infection  POST-OPERATIVE DIAGNOSIS:  Same  PROCEDURE:  ARTHROSCOPY WASHOUT OF SEPTIC RIGHT KNEE  SURGEON:  Jaylei Fuerte, Jewel BaizeIMOTHY D, MD  ASSISTANT: none  ANESTHESIA:   gen  PREOPERATIVE INDICATIONS:  Martin [name] is a  60 y.o. male with a diagnosis of Right Knee Infection who failed conservative measures and elected for surgical management.    The risks benefits and alternatives were discussed with the patient preoperatively including but not limited to the risks of infection, bleeding, nerve injury, cardiopulmonary complications, the need for revision surgery, among others, and the patient was willing to proceed.  OPERATIVE IMPLANTS: none  OPERATIVE FINDINGS: purulent fluid, degenerative changes primarily in patellofemoral  BLOOD LOSS: min  COMPLICATIONS: none  TOURNIQUET TIME: none  OPERATIVE PROCEDURE:  Patient was identified in the preoperative holding area and site was marked by me He was transported to the operating theater and placed on the table in supine position taking care to pad all bony prominences. After a preincinduction time out anesthesia was induced. The right lower extremity was prepped and draped in normal sterile fashion and a pre-incision timeout was performed. He received ancef after culture for preoperative antibiotics.    I made an inferior medial and lateral tablet portal as well as a superior lateral outflow portal. I inserted the arthroscope as well as an outflow cannula. 2 tour of his knee there was purulent fluid I sent this for culture and then gave Ancef 4 g. I then inserted a shaver debrided any excessive synovium he had a large radial medial meniscus tear debrided this as well. A portion of the lateral meniscus tear that I debrided also. I then irrigated the knee with 6 L of saline. I then removed DOS all arthroscopic equipment expressed all fluid I closed his  portals with simple stitches and injected 80 mg of Depo-Medrol mixed with Marcaine. Sterile dressings were applied he was awoken and taken the PACU in stable condition.  POST OPERATIVE PLAN:  Weightbearing as tolerated ID consult    This note was generated using a template and dragon dictation system. In light of that, I have reviewed the note and all aspects of it are applicable to this case. Any dictation errors are due to the computerized dictation system.

## 2015-12-22 NOTE — Progress Notes (Signed)
Pharmacy Antibiotic Note  Martin MollLarry [name] is a 60 y.o. male admitted on 12/21/2015 with arthroscopic washout of knee.  Pharmacy has been consulted for vancomycin dosing.  Weight 51.7kg, unknown height. Est CrCl ~40-2745mL/min  Plan: Vancomycin 1g IV x1, then 500mg  IV every 12 hours.   Goal trough 15-20 mcg/mL. Follow ortho workup, c/s, renal function     Temp (24hrs), Avg:98 F (36.7 C), Min:97.6 F (36.4 C), Max:98.6 F (37 C)   Recent Labs Lab 12/21/15 1551 12/22/15 0540  WBC 3.7* 4.0  CREATININE 1.12 1.38*    CrCl cannot be calculated (Unknown ideal weight.).    No Known Allergies  Antimicrobials this admission: Doxycycline 3/13 x1 Ceftriaxone 3/13 >>  Vancomycin 3/14>>  Dose adjustments this admission: n/a  Microbiology results: 3/14 MRSA PCR: neg  Thank you for allowing pharmacy to be a part of this patient's care.  Izekiel Flegel D. Norvella Loscalzo, PharmD, BCPS Clinical Pharmacist Pager: [phone] 12/22/2015 10:15 AM

## 2015-12-22 NOTE — Transfer of Care (Signed)
Immediate Anesthesia Transfer of Care Note  Patient: Martin [name]  Procedure(s) Performed: Procedure(s): ARTHROSCOPY WASHOUT OF SEPTIC RIGHT KNEE (Right)  Patient Location: PACU  Anesthesia Type:General  Level of Consciousness: awake and alert   Airway & Oxygen Therapy: Patient Spontanous Breathing and Patient connected to face mask oxygen  Post-op Assessment: Report given to RN, Post -op Vital signs reviewed and stable and Patient moving all extremities  Post vital signs: Reviewed and stable  Last Vitals:  Filed Vitals:   12/22/15 0500 12/22/15 1256  BP: 104/79 125/75  Pulse: 65 54  Temp: 36.6 C 36.7 C  Resp:  18    Complications: No apparent anesthesia complications

## 2015-12-22 NOTE — H&P (View-Only) (Signed)
ORTHOPAEDIC CONSULTATION  REQUESTING PHYSICIAN: Thurnell Lose, MD  Chief Complaint: right knee infection   HPI: Martin [name] is a 60 y.o. male who complains of  2wks of R knee pain with swelling and warmth. Aspiration showed 60K plus WBC. Pain has worsened after fluid reacumulated.   Past Medical History  Diagnosis Date  . Deaf   . Legally blind   . Gout   . Hypothyroidism   . Chronic constipation    Past Surgical History  Procedure Laterality Date  . Knee surgery Left 2015/2016  . Eye surgery Right     "relieved fluid pressure and cleaned it out"   Social History   Social History  . Marital Status: Married    Spouse Name: N/A  . Number of Children: N/A  . Years of Education: N/A   Social History Main Topics  . Smoking status: Never Smoker   . Smokeless tobacco: Never Used  . Alcohol Use: No  . Drug Use: No  . Sexual Activity: Yes   Other Topics Concern  . None   Social History Narrative   Family History  Problem Relation Age of Onset  . Family history unknown: Yes   No Known Allergies Prior to Admission medications   Medication Sig Start Date End Date Taking? Authorizing Provider  bimatoprost (LUMIGAN) 0.01 % SOLN Place 1 drop into both eyes at bedtime.   Yes Historical Provider, MD  diclofenac (VOLTAREN) 75 MG EC tablet Take 75 mg by mouth 2 (two) times daily.   Yes Historical Provider, MD  DORZOLAMIDE HCL-TIMOLOL MAL OP Apply 1 drop to eye 2 (two) times daily.   Yes Historical Provider, MD  doxycycline (VIBRAMYCIN) 100 MG capsule Take 1 capsule (100 mg total) by mouth 2 (two) times daily. 12/17/15  Yes Christopher Lawyer, PA-C  gabapentin (NEURONTIN) 300 MG capsule Take 300 mg by mouth 3 (three) times daily.   Yes Historical Provider, MD  HYDROcodone-acetaminophen (NORCO/VICODIN) 5-325 MG tablet Take 1 tablet by mouth every 6 (six) hours as needed for moderate pain. 12/17/15  Yes Christopher Lawyer, PA-C  levothyroxine (SYNTHROID, LEVOTHROID) 125 MCG  tablet Take 125 mcg by mouth daily before breakfast.   Yes Historical Provider, MD  Multiple Vitamin (MULTIVITAMIN WITH MINERALS) TABS tablet Take 1 tablet by mouth daily.   Yes Historical Provider, MD  ondansetron (ZOFRAN) 4 MG tablet Take 1 tablet (4 mg total) by mouth every 8 (eight) hours as needed for nausea or vomiting. 08/04/15  Yes Domenic Moras, PA-C  oxyCODONE-acetaminophen (PERCOCET/ROXICET) 5-325 MG tablet Take 1 tablet by mouth every 6 (six) hours as needed for severe pain. 12/17/15  Yes Christopher Lawyer, PA-C  senna (SENOKOT) 8.6 MG TABS tablet Take 1 tablet by mouth 2 (two) times daily as needed for mild constipation.   Yes Historical Provider, MD   Abd 1 View (kub)  12/21/2015  CLINICAL DATA:  Abdominal pain and constipation for 2 days. EXAM: ABDOMEN - 1 VIEW COMPARISON:  CT scan of August 03, 2015. FINDINGS: No small bowel dilatation is noted. Severe colonic distention is noted which was present on prior CT scan. This most likely represents adynamic ileus. IMPRESSION: Stable probable chronic colonic distention compared to prior exam. This may represent adynamic ileus. Electronically Signed   By: Marijo Conception, M.D.   On: 12/21/2015 15:23   Dg Hand Complete Left  12/21/2015  CLINICAL DATA:  Left hand pain for 1 week. EXAM: LEFT HAND - COMPLETE 3+ VIEW COMPARISON:  None.  FINDINGS: Mild degenerative changes are noted in the DIP joints The more prominent picture is an inflammatory arthritis. There are erosions along the proximal third and fourth metacarpals. Diffuse soft tissue swelling is present over the dorsum of the hand. There is asymmetric soft tissue swelling in the distal middle finger. The PIP joint of the ring finger is fused. IMPRESSION: 1. 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 2. 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 3. Mild degenerative changes in the DIP joints. Electronically Signed   By:  San Morelle M.D.   On: 12/21/2015 15:29    Positive ROS: All other systems have been reviewed and were otherwise negative with the exception of those mentioned in the HPI and as above.  Labs cbc  Recent Labs  12/21/15 1551 12/22/15 0540  WBC 3.7* 4.0  HGB 10.9* 10.3*  HCT 33.5* 32.8*  PLT PLATELET CLUMPS NOTED ON SMEAR, COUNT APPEARS ADEQUATE 170    Labs inflam No results for input(s): CRP in the last 72 hours.  Invalid input(s): ESR  Labs coag  Recent Labs  12/21/15 1551  INR 1.10     Recent Labs  12/21/15 1551 12/22/15 0540  NA 141 138  K 5.1 4.5  CL 104 100*  CO2 26 28  GLUCOSE 87 85  BUN 19 22*  CREATININE 1.12 1.38*  CALCIUM 9.7 9.3    Physical Exam: Filed Vitals:   12/22/15 0044 12/22/15 0500  BP: 105/71 104/79  Pulse: 60 65  Temp: 98 F (36.7 C) 97.8 F (36.6 C)  Resp:     General: Alert, no acute distress Cardiovascular: No pedal edema Respiratory: No cyanosis, no use of accessory musculature GI: No organomegaly, abdomen is soft and non-tender Skin: No lesions in the area of chief complaint other than those listed below in MSK exam.  Neurologic: Sensation intact distally save for the below mentioned MSK exam Psychiatric: Patient is competent for consent with normal mood and affect Lymphatic: No axillary or cervical lymphadenopathy  MUSCULOSKELETAL:  RLE: compartments soft, large knee effusion, NVI distally Other extremities are atraumatic with painless ROM and NVI.  Assessment: R knee possible septic knee  Plan: Level of cells in aspiration indicates infection so will perform arthroscopic I&D.  ID consult Hand consult   Renette Butters, MD Cell [phone]   12/22/2015 11:31 AM

## 2015-12-22 NOTE — Interval H&P Note (Signed)
History and Physical Interval Note:  12/22/2015 11:33 AM  Martin MollLarry [name]  has presented today for surgery, with the diagnosis of Right Knee Infection  The various methods of treatment have been discussed with the patient and family. After consideration of risks, benefits and other options for treatment, the patient has consented to  Procedure(s): ARTHROSCOPY WASHOUT OF SEPTIC RIGHT KNEE (Right) as a surgical intervention .  The patient's history has been reviewed, patient examined, no change in status, stable for surgery.  I have reviewed the patient's chart and labs.  Questions were answered to the patient's satisfaction.     Charnae Lill D

## 2015-12-22 NOTE — Anesthesia Postprocedure Evaluation (Signed)
Anesthesia Post Note  Patient: Martin [name]  Procedure(s) Performed: Procedure(s) (LRB): ARTHROSCOPY WASHOUT OF SEPTIC RIGHT KNEE (Right)  Patient location during evaluation: PACU Anesthesia Type: General Level of consciousness: awake and awake and alert Pain management: pain level controlled Vital Signs Assessment: post-procedure vital signs reviewed and stable Respiratory status: spontaneous breathing Cardiovascular status: blood pressure returned to baseline Anesthetic complications: no    Last Vitals:  Filed Vitals:   12/22/15 1645 12/22/15 1704  BP: 128/101 141/87  Pulse: 63   Temp: 36.4 C 36.4 C  Resp: 15 18    Last Pain:  Filed Vitals:   12/22/15 1708  PainSc: 0-No pain                 Donovyn Guidice COKER

## 2015-12-23 ENCOUNTER — Encounter (HOSPITAL_COMMUNITY): Payer: Self-pay | Admitting: Orthopedic Surgery

## 2015-12-23 DIAGNOSIS — H9193 Unspecified hearing loss, bilateral: Secondary | ICD-10-CM

## 2015-12-23 DIAGNOSIS — G894 Chronic pain syndrome: Secondary | ICD-10-CM

## 2015-12-23 DIAGNOSIS — H548 Legal blindness, as defined in USA: Secondary | ICD-10-CM

## 2015-12-23 DIAGNOSIS — E43 Unspecified severe protein-calorie malnutrition: Secondary | ICD-10-CM | POA: Insufficient documentation

## 2015-12-23 DIAGNOSIS — M009 Pyogenic arthritis, unspecified: Secondary | ICD-10-CM | POA: Diagnosis not present

## 2015-12-23 LAB — BASIC METABOLIC PANEL
ANION GAP: 11 (ref 5–15)
BUN: 14 mg/dL (ref 6–20)
CALCIUM: 8.9 mg/dL (ref 8.9–10.3)
CO2: 25 mmol/L (ref 22–32)
Chloride: 98 mmol/L — ABNORMAL LOW (ref 101–111)
Creatinine, Ser: 1.23 mg/dL (ref 0.61–1.24)
Glucose, Bld: 95 mg/dL (ref 65–99)
POTASSIUM: 4.7 mmol/L (ref 3.5–5.1)
Sodium: 134 mmol/L — ABNORMAL LOW (ref 135–145)

## 2015-12-23 LAB — CBC
HEMATOCRIT: 35.7 % — AB (ref 39.0–52.0)
Hemoglobin: 11.5 g/dL — ABNORMAL LOW (ref 13.0–17.0)
MCH: 29.8 pg (ref 26.0–34.0)
MCHC: 32.2 g/dL (ref 30.0–36.0)
MCV: 92.5 fL (ref 78.0–100.0)
Platelets: 162 10*3/uL (ref 150–400)
RBC: 3.86 MIL/uL — AB (ref 4.22–5.81)
RDW: 14.9 % (ref 11.5–15.5)
WBC: 6.2 10*3/uL (ref 4.0–10.5)

## 2015-12-23 LAB — RHEUMATOID FACTOR: Rhuematoid fact SerPl-aCnc: <10 IU/mL (ref 0.0–13.9)

## 2015-12-23 LAB — ANTINUCLEAR ANTIBODIES, IFA: ANTINUCLEAR ANTIBODIES, IFA: NEGATIVE

## 2015-12-23 NOTE — Care Management Note (Signed)
Case Management Note  Patient Details  Name: Martin MollLarry [name] MRN: [id number] Date of Birth: 01/18/1956  Subjective/Objective:     60 yr old gentleman s/p   ARTHROSCOPY WASHOUT OF SEPTIC RIGHT KNEE . Patient is deaf and legally blind, interpreters are provided through the association for hearing and visually impaired.   Action/Plan:  Patient may go home on IV antibiotics, CM will continue to follow  Expected Discharge Date:                  Expected Discharge Plan:     In-House Referral:     Discharge planning Services     Post Acute Care Choice:    Choice offered to:     DME Arranged:    DME Agency:     HH Arranged:    HH Agency:     Status of Service:   in process  Medicare Important Message Given:    Date Medicare IM Given:    Medicare IM give by:    Date Additional Medicare IM Given:    Additional Medicare Important Message give by:     If discussed at Long Length of Stay Meetings, dates discussed:    Additional Comments:  Durenda GuthrieBrady, Vander Kueker Naomi, RN 12/23/2015, 4:10 PM

## 2015-12-23 NOTE — Progress Notes (Signed)
Doing well.  If cultures remain negative he may benefit from outpatient rheumatology eval. Ok for d/c from ortho standpoint once Abx plan is in place per ID.     Katieann Hungate D

## 2015-12-23 NOTE — Progress Notes (Signed)
Legally blind 60 year old male Hypothyroidism Constipation chronic with chronic pain Prior history bilateral knee and hand pain status post injection 12/12/15 in the emergency room Admitted with potentially septic knee joint 12/11/68    Subjective:   Fair No fever no chills no n/v Pain manageable No cough cold Passing stool-no diarr Sign lang interpreter present for interview   Assessment  & Plan :     1. R knee Septic Arthritis - Fluids show no crystals with extremely high WBC count which is neutrophil predominant, switched to vancomycin and IV Rocephin, orthopedic onboard taking the patient to OR for incision and drainage on 12/22/2015.  Note no blood cultures were drawn on admission, we will wait for perioperative wound culture and results. Gram stain and wound culture from outpatient studies on 12/17/2015 are negative so far. Consider de-escalation abx to monotherapy? Appreciate ID input  2. Left hand diffuse swelling. X-ray suggestive of inflammatory arthritis, seen by hand surgeon Dr. Janee Morn who recommends no intervention, will check rheumatoid factor, ANA, uric acid level, could have gouty arthritis or psoriatic arthritis, for now colchicine, if cleared by ID ?trial of systemic IV steroids and monitor. Likely require outpatient rheumatology follow-up. This seems improved to some extent-he is moving his fingers reasonably well  3. Deaf and legally blind. Sign language interpreter utilized with good effect, supportive care.  4. Chronic ileus. No nausea or abdominal pain. Continue bowel regimen.  5. ARF. Hold NSAID for now, hydrate and monitor.  6. Hypothyroidism. On Synthroid continue.   Code Status : Full  Family Communication  : Girl friend    Disposition Plan  : Stay inpatient  Consults   :  Arlys John, Hand Surgeon - Janee Morn, ID Dr Orvan Falconer  Procedures  :     DVT Prophylaxis  : Heparin   Lab Results  Component Value Date   PLT 162 12/23/2015    Inpatient Medications  Scheduled Meds: . bisacodyl  10 mg Rectal Daily  . cefTRIAXone (ROCEPHIN)  IV  2 g Intravenous Q24H  . colchicine  0.6 mg Oral Daily  . dorzolamide-timolol  1 drop Both Eyes BID  . feeding supplement (ENSURE ENLIVE)  237 mL Oral BID BM  . gabapentin  300 mg Oral TID  . heparin  5,000 Units Subcutaneous Once  . latanoprost  1 drop Both Eyes QHS  . levothyroxine  125 mcg Oral QAC breakfast  . senna  2 tablet Oral BID  . vancomycin  500 mg Intravenous Q12H   Continuous Infusions: . sodium chloride 75 mL/hr at 12/22/15 1715  . lactated ringers     PRN Meds:.morphine injection, [DISCONTINUED] ondansetron **OR** ondansetron (ZOFRAN) IV, oxyCODONE-acetaminophen  Antibiotics  :   Anti-infectives    Start     Dose/Rate Route Frequency Ordered Stop   12/22/15 2200  vancomycin (VANCOCIN) 500 mg in sodium chloride 0.9 % 100 mL IVPB     500 mg 100 mL/hr over 60 Minutes Intravenous Every 12 hours 12/22/15 1018     12/22/15 2100  ceFAZolin (ANCEF) IVPB 2 g/50 mL premix     2 g 100 mL/hr over 30 Minutes Intravenous Every 6 hours 12/22/15 1701 12/23/15 1024   12/22/15 0800  cefTRIAXone (ROCEPHIN) 2 g in dextrose 5 % 50 mL IVPB  2 g 100 mL/hr over 30 Minutes Intravenous Every 24 hours 12/22/15 0732     12/22/15 0745  vancomycin (VANCOCIN) IVPB 1000 mg/200 mL premix     1,000 mg 200 mL/hr over 60 Minutes Intravenous  Once 12/22/15 0744 12/22/15 1050   12/21/15 2200  doxycycline (VIBRA-TABS) tablet 100 mg  Status:  Discontinued     100 mg Oral 2 times daily 12/21/15 1247 12/22/15 0732        Objective:   Filed Vitals:   12/22/15 1645 12/22/15 1704 12/22/15 2100 12/23/15 0700  BP: 128/101 141/87 131/83 116/76  Pulse: 63  59 60  Temp: 97.6 F (36.4 C) 97.6 F (36.4 C) 97.7 F (36.5 C)  98.2 F (36.8 C)  TempSrc:  Oral Oral   Resp: 15 18 18 18   Height:      Weight:      SpO2: 99% 99% 98% 99%    Wt Readings from Last 3 Encounters:  12/22/15 51.71 kg (114 lb)     Intake/Output Summary (Last 24 hours) at 12/23/15 1056 Last data filed at 12/23/15 0750  Gross per 24 hour  Intake   1420 ml  Output    610 ml  Net    810 ml     Physical Exam  Awake Alert, Oriented X 3, No new F.N deficits, Normal affect [place].AT,PERRAL Supple Neck,No JVD, No cervical lymphadenopathy appriciated.  Symmetrical Chest wall movement, Good air movement bilaterally, CTAB RRR,No Gallops,Rubs or new Murmurs, No Parasternal Heave +ve B.Sounds, Abd Soft but distended, No tenderness, No organomegaly appriciated, No rebound - guarding or rigidity. No Cyanosis, Clubbing or edema, No new Rash or bruise  R Knee in immobilizers, L hand mildly swollen diffusely    Data Review:   Micro Results Recent Results (from the past 240 hour(s))  Culture, body fluid-bottle     Status: None   Collection Time: 12/17/15  2:54 PM  Result Value Ref Range Status   Specimen Description SYNOVIAL RIGHT KNEE  Final   Special Requests BOTTLES DRAWN AEROBIC AND ANAEROBIC 5CC  Final   Culture   Final    NO GROWTH 5 DAYS [facility]    Report Status 12/22/2015 FINAL  Final  Gram stain     Status: None   Collection Time: 12/17/15  2:54 PM  Result Value Ref Range Status   Specimen Description SYNOVIAL  Final   Special Requests NONE  Final   Gram Stain   Final    ABUNDANT WBC PRESENT, PREDOMINANTLY PMN NO ORGANISMS SEEN Performed at [facility]     Report Status 12/17/2015 FINAL  Final  Surgical pcr screen     Status: None   Collection Time: 12/22/15  2:25 AM  Result Value Ref Range Status   MRSA, PCR NEGATIVE NEGATIVE Final   Staphylococcus aureus NEGATIVE NEGATIVE Final    Comment:        The Xpert SA Assay (FDA approved for NASAL specimens in patients over 21 years of  age), is one component of a comprehensive surveillance program.  Test performance has been validated by [facility] for patients greater than or equal to 58 year old. It is not intended to diagnose infection nor to guide or monitor treatment.   Gram stain     Status: None   Collection Time: 12/22/15  3:35 PM  Result Value Ref Range Status   Specimen Description SYNOVIAL RIGHT KNEE  Final   Special Requests NONE  Final  Gram Stain   Final    ABUNDANT WBC PRESENT,BOTH PMN AND MONONUCLEAR NO ORGANISMS SEEN    Report Status 12/22/2015 FINAL  Final    Radiology Reports Abd 1 View (kub)  12/21/2015  CLINICAL DATA:  Abdominal pain and constipation for 2 days. EXAM: ABDOMEN - 1 VIEW COMPARISON:  CT scan of August 03, 2015. FINDINGS: No small bowel dilatation is noted. Severe colonic distention is noted which was present on prior CT scan. This most likely represents adynamic ileus. IMPRESSION: Stable probable chronic colonic distention compared to prior exam. This may represent adynamic ileus. Electronically Signed   By: Lupita Raider, M.D.   On: 12/21/2015 15:23   Dg Knee Complete 4 Views Right  12/17/2015  CLINICAL DATA:  Bilateral leg pain, history of gout, pain and swelling right knee for 2 days EXAM: RIGHT KNEE - COMPLETE 4+ VIEW COMPARISON:  None. FINDINGS: Five views of the right knee submitted. No acute fracture or subluxation. Mild narrowing of medial joint compartment. There is old fracture deformity proximal shaft of right fibula. Moderate joint effusion. IMPRESSION: No acute fracture or subluxation. Mild degenerative changes. Moderate joint effusion. Old fracture of proximal shaft of the right fibula. Electronically Signed   By: Natasha Mead M.D.   On: 12/17/2015 13:36   Dg Hand Complete Left  12/21/2015  CLINICAL DATA:  Left hand pain for 1 week. EXAM: LEFT HAND - COMPLETE 3+ VIEW COMPARISON:  None. FINDINGS: Mild degenerative changes are noted in the DIP joints The more prominent  picture is an inflammatory arthritis. There are erosions along the proximal third and fourth metacarpals. Diffuse soft tissue swelling is present over the dorsum of the hand. There is asymmetric soft tissue swelling in the distal middle finger. The PIP joint of the ring finger is fused. IMPRESSION: 1. 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 2. 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 3. Mild degenerative changes in the DIP joints. Electronically Signed   By: Marin Roberts M.D.   On: 12/21/2015 15:29     CBC  Recent Labs Lab 12/21/15 1551 12/22/15 0540 12/23/15 0528  WBC 3.7* 4.0 6.2  HGB 10.9* 10.3* 11.5*  HCT 33.5* 32.8* 35.7*  PLT PLATELET CLUMPS NOTED ON SMEAR, COUNT APPEARS ADEQUATE 170 162  MCV 92.8 93.7 92.5  MCH 30.2 29.4 29.8  MCHC 32.5 31.4 32.2  RDW 14.9 15.2 14.9  LYMPHSABS 1.1  --   --   MONOABS 0.3  --   --   EOSABS 0.1  --   --   BASOSABS 0.0  --   --     Chemistries   Recent Labs Lab 12/21/15 1551 12/22/15 0540 12/22/15 0753 12/23/15 0528  NA 141 138  --  134*  K 5.1 4.5  --  4.7  CL 104 100*  --  98*  CO2 26 28  --  25  GLUCOSE 87 85  --  95  BUN 19 22*  --  14  CREATININE 1.12 1.38*  --  1.23  CALCIUM 9.7 9.3  --  8.9  MG 2.0  --  1.8  --   AST 48*  --   --   --   ALT 37  --   --   --   ALKPHOS 76  --   --   --   BILITOT 0.6  --   --   --    ------------------------------------------------------------------------------------------------------------------  Recent Labs  12/22/15 0540  CHOL 200  HDL 52  LDLCALC 117*  TRIG 153*  CHOLHDL 3.8    Lab Results  Component Value Date   HGBA1C 5.9* 12/21/2015   ------------------------------------------------------------------------------------------------------------------ No results for input(s): TSH, T4TOTAL, T3FREE, THYROIDAB in the last 72 hours.  Invalid input(s):  FREET3 ------------------------------------------------------------------------------------------------------------------ No results for input(s): VITAMINB12, FOLATE, FERRITIN, TIBC, IRON, RETICCTPCT in the last 72 hours.  Coagulation profile  Recent Labs Lab 12/21/15 1551  INR 1.10    No results for input(s): DDIMER in the last 72 hours.  Cardiac Enzymes No results for input(s): CKMB, TROPONINI, MYOGLOBIN in the last 168 hours.  Invalid input(s): CK ------------------------------------------------------------------------------------------------------------------ No results found for: BNP  Time Spent in minutes  35  Pleas Koch, MD [facility] [phone]

## 2015-12-23 NOTE — Progress Notes (Signed)
[facility]    Date of Admission:  12/21/2015   Total days of antibiotics 3        Ceftriaxone 3/14>>        Vancomycin 3/14>>        Doxycycline 3/13>>3/13        Cefazolin 3/13>>3/14  Active Problems:   Septic joint (HCC)   Chronic pain syndrome   Legally blind   Deaf   Hypothyroidism   Swelling of left hand   Constipation   . bisacodyl  10 mg Rectal Daily  .  ceFAZolin (ANCEF) IV  2 g Intravenous Q6H  . cefTRIAXone (ROCEPHIN)  IV  2 g Intravenous Q24H  . colchicine  0.6 mg Oral Daily  . dorzolamide-timolol  1 drop Both Eyes BID  . feeding supplement (ENSURE ENLIVE)  237 mL Oral BID BM  . gabapentin  300 mg Oral TID  . heparin  5,000 Units Subcutaneous Once  . Influenza vac split quadrivalent PF  0.5 mL Intramuscular Tomorrow-1000  . latanoprost  1 drop Both Eyes QHS  . levothyroxine  125 mcg Oral QAC breakfast  . senna  2 tablet Oral BID  . vancomycin  500 mg Intravenous Q12H    SUBJECTIVE: Martin [name] is lying in bed and in no distress.  He is deaf and legally blind but able to see sign language interpreter, who provided interpretation services.  States he feels better now after having surgery, pain is well controlled.  Symptoms began a few weeks ago and progressively worsened until he was unable to bear weight.  His hand pain and swelling began shortly after the knee pain.  He thinks his hand has improved slightly as well.  Review of Systems: Review of Systems  Constitutional: Negative for fever and chills.  Respiratory: Negative for cough and shortness of breath.   Cardiovascular: Negative for chest pain.  Gastrointestinal: Negative for abdominal pain and diarrhea.  Musculoskeletal: Positive for joint pain. Negative for back pain.    Past Medical History  Diagnosis Date  . Deaf   . Legally blind   . Gout   . Hypothyroidism   . Chronic constipation     Social History  Substance Use Topics  . Smoking status: Never  Smoker   . Smokeless tobacco: Never Used  . Alcohol Use: No    Family History  Problem Relation Age of Onset  . Family history unknown: Yes   No Known Allergies  OBJECTIVE: Filed Vitals:   12/22/15 1645 12/22/15 1704 12/22/15 2100 12/23/15 0700  BP: 128/101 141/87 131/83 116/76  Pulse: 63  59 60  Temp: 97.6 F (36.4 C) 97.6 F (36.4 C) 97.7 F (36.5 C) 98.2 F (36.8 C)  TempSrc:  Oral Oral   Resp: Height:      Weight:      SpO2: 99% 99% 98% 99%   Body mass index is 15.46 kg/(m^2).  Physical Exam  Constitutional: He is oriented to person, place, and time and well-developed, well-nourished, and in no distress.  Legally blind and deaf gentleman, wearing glasses, using ASL interpreter  HENT:  Head: Normocephalic and atraumatic.  Neck: Normal range of motion.  Cardiovascular: Normal rate, regular rhythm and normal heart sounds.   Pulmonary/Chest: Effort normal and breath sounds normal.  Abdominal: Soft. Bowel sounds are normal. There is no tenderness.  Musculoskeletal:  Left hand with swelling of MCP joint and decreased ROM. Dystrophy  of nails present on bilateral hands. Right leg immobilized in splint.  Neurological: He is alert and oriented to person, place, and time.    Lab Results Lab Results  Component Value Date   WBC 6.2 12/23/2015   HGB 11.5* 12/23/2015   HCT 35.7* 12/23/2015   MCV 92.5 12/23/2015   PLT 162 12/23/2015    Lab Results  Component Value Date   CREATININE 1.23 12/23/2015   BUN 14 12/23/2015   NA 134* 12/23/2015   K 4.7 12/23/2015   CL 98* 12/23/2015   CO2 25 12/23/2015    Lab Results  Component Value Date   ALT 37 12/21/2015   AST 48* 12/21/2015   ALKPHOS 76 12/21/2015   BILITOT 0.6 12/21/2015     Microbiology: Recent Results (from the past 240 hour(s))  Culture, body fluid-bottle     Status: None   Collection Time: 12/17/15  2:54 PM  Result Value Ref Range Status   Specimen Description SYNOVIAL RIGHT KNEE  Final     Special Requests BOTTLES DRAWN AEROBIC AND ANAEROBIC 5CC  Final   Culture   Final    NO GROWTH 5 DAYS Performed at [facility]    Report Status 12/22/2015 FINAL  Final  Gram stain     Status: None   Collection Time: 12/17/15  2:54 PM  Result Value Ref Range Status   Specimen Description SYNOVIAL  Final   Special Requests NONE  Final   Gram Stain   Final    ABUNDANT WBC PRESENT, PREDOMINANTLY PMN NO ORGANISMS SEEN Performed at [facility]    Report Status 12/17/2015 FINAL  Final  Surgical pcr screen     Status: None   Collection Time: 12/22/15  2:25 AM  Result Value Ref Range Status   MRSA, PCR NEGATIVE NEGATIVE Final   Staphylococcus aureus NEGATIVE NEGATIVE Final    Comment:        The Xpert SA Assay (FDA approved for NASAL specimens in patients over 58 years of age), is one component of a comprehensive surveillance program.  Test performance has been validated by [facility] for patients greater than or equal to 58 year old. It is not intended to diagnose infection nor to guide or monitor treatment.   Gram stain     Status: None   Collection Time: 12/22/15  3:35 PM  Result Value Ref Range Status   Specimen Description SYNOVIAL RIGHT KNEE  Final   Special Requests NONE  Final   Gram Stain   Final    ABUNDANT WBC PRESENT,BOTH PMN AND MONONUCLEAR NO ORGANISMS SEEN    Report Status 12/22/2015 FINAL  Final     ASSESSMENT: Martin [name] is a 60 year old man who underwent arthroscopy washout of a septic right knee.  Operative findings note purulent fluid encountered with degenerative changes as well.  Intraoperative Gram stain did not show any organisms.  Culture is processing.  This is consistent with joint fluid analysis from 12/17/15.  According to UpToDate, if initial Gram stain is negative and the patient is immunocompetent, we suggest treatment with vancomycin.  While there have been no controlled trials examining the duration of antimicrobial therapy in  bacterial arthritis; treatment recommendations are based on case series.  "We typically administer parenteral antibiotics for at least 14 days followed by oral therapy (if possible) for an additional 14 days" per UpToDate.  PLAN: 1. Consider stopping ceftriaxone 2. Consider continuation of vancomycin for 2 more weeks from 12/22/15 through 01/05/16  with possible transition to oral antibiotics for additional 14 days 3. If continuing vancomycin for 2 weeks, would need PICC line and possible SNF placement for help administering antibiotics  Gwynn Burly, DO   12/23/2015, 9:57 AM   Addendum: Martin [name] developed acute right knee pain and swelling several weeks ago. He is also had some milder pain and swelling of his left hand. He was seen in the emergency department on 12/17/2015 and right knee arthrocentesis showed turbid fluid with 65,190 white blood cells of which 91% were neutrophils. No crystals were seen. Gram stain and culture were negative. He was sent out on oral doxycycline but readmitted with worsening pain. He went to the OR yesterday and had his knee washed out. Dr. Greig Right operative note indicates that the knee was full of purulent fluid. The operative Gram stain shows no organisms and cultures are negative so far. This may very well be culture-negative septic arthritis. I talked to Martin [name] and his wife with the aid of the American sign language interpreter. I told him that we would normally send him out with a PICC and several weeks of IV antibiotic therapy. He indicated that he thought that he could manage this. However, before making a decision about home IV antibiotic therapy I have asked Jeri Modena, of [facility], to speak with him and give [place] her opinion of whether or not that is feasible given the language barrier and potential lack of an appropriate interpreter when his home nurse visits. She will see him first thing in the morning. If that does not appear practical or feasible  we will need to consider oral alternative such as a floraquinolone antibiotic.  Cliffton Asters, MD [facility] [phone] pager   [phone] cell 12/23/2015, 4:42 PM

## 2015-12-24 DIAGNOSIS — B9689 Other specified bacterial agents as the cause of diseases classified elsewhere: Secondary | ICD-10-CM

## 2015-12-24 DIAGNOSIS — M00861 Arthritis due to other bacteria, right knee: Secondary | ICD-10-CM

## 2015-12-24 DIAGNOSIS — R740 Nonspecific elevation of levels of transaminase and lactic acid dehydrogenase [LDH]: Secondary | ICD-10-CM

## 2015-12-24 DIAGNOSIS — Z638 Other specified problems related to primary support group: Secondary | ICD-10-CM

## 2015-12-24 NOTE — Care Management Note (Signed)
Case Management Note  Patient Details  Name: Martin MollLarry Henry MRN: [id number] Date of Birth: 1956/04/17  Subjective/Objective:    60 yr old male s/p right knee arthroscopy washout.                Action/Plan: patient is legally blind and is deaf. Communication is done with a language interpreter. Patient has been setup with home health for Labs, PT/NA and a Child psychotherapistsocial worker with [facility]. Case manager has made followup appointment with Dr. Cliffton AstersJohn Campbell [facility] [address]. Appt is Tuesday January 12, 2016 at 10:30am., appointment added to discharge instructions. Patient will also have scheduled appointment with Dr. Eulah PontMurphy for follow-up.   Expected Discharge Date:   12/26/15               Expected Discharge Plan:   Home with Home Health  In-House Referral:     Discharge planning Services  CM Consult  Post Acute Care Choice:  Home Health Choice offered to:     DME Arranged:    DME Agency:     HH Arranged:  RN, PT, Nurse's Aide, Social Work [employer] Agency:  [facility]  Status of Service:  In process, will continue to follow  Medicare Important Message Given:  Yes Date Medicare IM Given:    Medicare IM give by:    Date Additional Medicare IM Given:    Additional Medicare Important Message give by:     If discussed at Long Length of Stay Meetings, dates discussed:    Additional Comments:  Durenda GuthrieBrady, Micheale Schlack Naomi, RN 12/24/2015, 3:55 PM

## 2015-12-24 NOTE — Progress Notes (Signed)
Legally blind 60 year old male Hypothyroidism Constipation chronic with chronic pain Prior history bilateral knee and hand pain status post injection 12/12/15 in the emergency room Admitted with potentially septic knee joint 12/11/68    Subjective:   Fair No fever no chills no n/v Pain manageable No cough cold Passing stool-no diarr Sign lang interpreter present for interview   Assessment  & Plan :     1. R knee Septic Arthritis - Fluids show no crystals with extremely high WBC count which is neutrophil predominant, switched to vancomycin and IV Rocephin, s/p OR for arthroscopic eval on 12/22/2015.  Note no blood cultures were drawn on admission,  Gram stain and wound culture from outpatient studies on 12/17/2015 are negative so far. Consider de-escalation abx to Levaquin/DOxy PO on d/c Ideally prefer IV therapy-challenge to coordinate given social and logistic factors Long d/w patient-offered SNf-prefers return to his appt c wife WBAT on d/c Follow-ups scheduled with ID and Ortho in [facility] Appreciate ID input  2. Left hand diffuse swelling. X-ray suggestive of inflammatory arthritis, seen by hand surgeon Dr. Janee Morn who recommends no intervention, will check rheumatoid factor, ANA, uric acid level, could have gouty arthritis or psoriatic arthritis, for now colchicine. Likely require outpatient rheumatology follow-up. This seems improved to some extent-he is moving his fingers reasonably well  3. Deaf and legally blind. Sign language interpreter utilized with good effect, supportive care.  4. Chronic ileus. No nausea or abdominal pain. Continue bowel regimen.  5. ARF. Hold NSAID for now, hydrate and monitor.  6. Hypothyroidism. On Synthroid continue.  7. Social stressor NOS-claims wife "poisoning" APS  worker requested to investigate as OP  Code Status : Full  Family Communication  : Girl friend    Disposition Plan  : Stay inpatient-home am 3/17 on oral ABx, HH Pt/SW/RN  Consults  :  Arlys John, Hand Surgeon - Janee Morn, ID Dr Orvan Falconer  Procedures  :     DVT Prophylaxis  : Heparin   Lab Results  Component Value Date   PLT 162 12/23/2015    Inpatient Medications  Scheduled Meds: . bisacodyl  10 mg Rectal Daily  . cefTRIAXone (ROCEPHIN)  IV  2 g Intravenous Q24H  . colchicine  0.6 mg Oral Daily  . dorzolamide-timolol  1 drop Both Eyes BID  . feeding supplement (ENSURE ENLIVE)  237 mL Oral BID BM  . gabapentin  300 mg Oral TID  . heparin  5,000 Units Subcutaneous Once  . latanoprost  1 drop Both Eyes QHS  . levothyroxine  125 mcg Oral QAC breakfast  . senna  2 tablet Oral BID  . vancomycin  500 mg Intravenous Q12H   Continuous Infusions: . lactated ringers 50 mL/hr at 12/24/15 0500   PRN Meds:.morphine injection, [DISCONTINUED] ondansetron **OR** ondansetron (ZOFRAN) IV, oxyCODONE-acetaminophen  Antibiotics  :   Anti-infectives    Start     Dose/Rate Route Frequency Ordered Stop   12/22/15 2200  vancomycin (VANCOCIN) 500 mg in sodium chloride 0.9 % 100 mL IVPB     500 mg 100 mL/hr over 60 Minutes Intravenous Every 12 hours 12/22/15 1018     12/22/15 2100  ceFAZolin (ANCEF) IVPB 2 g/50 mL premix     2 g 100 mL/hr over 30 Minutes Intravenous  Every 6 hours 12/22/15 1701 12/23/15 1024   12/22/15 0800  cefTRIAXone (ROCEPHIN) 2 g in dextrose 5 % 50 mL IVPB     2 g 100 mL/hr over 30 Minutes Intravenous Every 24 hours 12/22/15 0732     12/22/15 0745  vancomycin (VANCOCIN) IVPB 1000 mg/200 mL premix     1,000 mg 200 mL/hr over 60 Minutes Intravenous  Once 12/22/15 0744 12/22/15 1050   12/21/15 2200  doxycycline (VIBRA-TABS) tablet 100 mg  Status:  Discontinued     100 mg Oral 2 times daily 12/21/15 1247 12/22/15 0732        Objective:   Filed Vitals:   12/23/15  1259 12/23/15 2052 12/24/15 0521 12/24/15 1300  BP: 92/67 107/73 107/76 114/70  Pulse: 56 64 65 58  Temp: 98.6 F (37 C) 98.3 F (36.8 C) 98.3 F (36.8 C) 98.1 F (36.7 C)  TempSrc:  Oral    Resp: 18 18 18 16   Height:      Weight:      SpO2: 98% 97% 96% 100%    Wt Readings from Last 3 Encounters:  12/22/15 51.71 kg (114 lb)     Intake/Output Summary (Last 24 hours) at 12/24/15 1704 Last data filed at 12/24/15 1300  Gross per 24 hour  Intake   1440 ml  Output    450 ml  Net    990 ml     Physical Exam  Awake Alert, Oriented X 3, No new F.N deficits, Normal affect [place].AT,PERRAL Supple Neck,No JVD, No cervical lymphadenopathy appriciated.  Symmetrical Chest wall movement, Good air movement bilaterally, CTAB RRR,No Gallops,Rubs or new Murmurs, No Parasternal Heave +ve B.Sounds, Abd Soft but distended, No tenderness, No organomegaly appriciated, No rebound - guarding or rigidity. R Knee in immobilizers, L hand mildly swollen diffusely    Data Review:   Micro Results Recent Results (from the past 240 hour(s))  Culture, body fluid-bottle     Status: None   Collection Time: 12/17/15  2:54 PM  Result Value Ref Range Status   Specimen Description SYNOVIAL RIGHT KNEE  Final   Special Requests BOTTLES DRAWN AEROBIC AND ANAEROBIC 5CC  Final   Culture   Final    NO GROWTH 5 DAYS Performed at [facility]    Report Status 12/22/2015 FINAL  Final  Gram stain     Status: None   Collection Time: 12/17/15  2:54 PM  Result Value Ref Range Status   Specimen Description SYNOVIAL  Final   Special Requests NONE  Final   Gram Stain   Final    ABUNDANT WBC PRESENT, PREDOMINANTLY PMN NO ORGANISMS SEEN Performed at [facility]    Report Status 12/17/2015 FINAL  Final  Surgical pcr screen     Status: None   Collection Time: 12/22/15  2:25 AM  Result Value Ref Range Status   MRSA, PCR NEGATIVE NEGATIVE Final   Staphylococcus aureus NEGATIVE NEGATIVE Final     Comment:        The Xpert SA Assay (FDA approved for NASAL specimens in patients over 58 years of age), is one component of a comprehensive surveillance program.  Test performance has been validated by [facility] for patients greater than or equal to 557 year old. It is not intended to diagnose infection nor to guide or monitor treatment.   Culture, body fluid-bottle     Status: None (Preliminary result)   Collection Time: 12/22/15  3:35 PM  Result Value Ref Range  Status   Specimen Description SYNOVIAL RIGHT KNEE  Final   Special Requests BOTTLES DRAWN AEROBIC ONLY 5CC  Final   Culture NO GROWTH 2 DAYS  Final   Report Status PENDING  Incomplete  Gram stain     Status: None   Collection Time: 12/22/15  3:35 PM  Result Value Ref Range Status   Specimen Description SYNOVIAL RIGHT KNEE  Final   Special Requests NONE  Final   Gram Stain   Final    ABUNDANT WBC PRESENT,BOTH PMN AND MONONUCLEAR NO ORGANISMS SEEN    Report Status 12/22/2015 FINAL  Final    Radiology Reports Abd 1 View (kub)  12/21/2015  CLINICAL DATA:  Abdominal pain and constipation for 2 days. EXAM: ABDOMEN - 1 VIEW COMPARISON:  CT scan of August 03, 2015. FINDINGS: No small bowel dilatation is noted. Severe colonic distention is noted which was present on prior CT scan. This most likely represents adynamic ileus. IMPRESSION: Stable probable chronic colonic distention compared to prior exam. This may represent adynamic ileus. Electronically Signed   By: Lupita Raider, M.D.   On: 12/21/2015 15:23   Dg Knee Complete 4 Views Right  12/17/2015  CLINICAL DATA:  Bilateral leg pain, history of gout, pain and swelling right knee for 2 days EXAM: RIGHT KNEE - COMPLETE 4+ VIEW COMPARISON:  None. FINDINGS: Five views of the right knee submitted. No acute fracture or subluxation. Mild narrowing of medial joint compartment. There is old fracture deformity proximal shaft of right fibula. Moderate joint effusion. IMPRESSION: No  acute fracture or subluxation. Mild degenerative changes. Moderate joint effusion. Old fracture of proximal shaft of the right fibula. Electronically Signed   By: Natasha Mead M.D.   On: 12/17/2015 13:36   Dg Hand Complete Left  12/21/2015  CLINICAL DATA:  Left hand pain for 1 week. EXAM: LEFT HAND - COMPLETE 3+ VIEW COMPARISON:  None. FINDINGS: Mild degenerative changes are noted in the DIP joints The more prominent picture is an inflammatory arthritis. There are erosions along the proximal third and fourth metacarpals. Diffuse soft tissue swelling is present over the dorsum of the hand. There is asymmetric soft tissue swelling in the distal middle finger. The PIP joint of the ring finger is fused. IMPRESSION: 1. 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 2. 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 3. Mild degenerative changes in the DIP joints. Electronically Signed   By: Marin Roberts M.D.   On: 12/21/2015 15:29     CBC  Recent Labs Lab 12/21/15 1551 12/22/15 0540 12/23/15 0528  WBC 3.7* 4.0 6.2  HGB 10.9* 10.3* 11.5*  HCT 33.5* 32.8* 35.7*  PLT PLATELET CLUMPS NOTED ON SMEAR, COUNT APPEARS ADEQUATE 170 162  MCV 92.8 93.7 92.5  MCH 30.2 29.4 29.8  MCHC 32.5 31.4 32.2  RDW 14.9 15.2 14.9  LYMPHSABS 1.1  --   --   MONOABS 0.3  --   --   EOSABS 0.1  --   --   BASOSABS 0.0  --   --     Chemistries   Recent Labs Lab 12/21/15 1551 12/22/15 0540 12/22/15 0753 12/23/15 0528  NA 141 138  --  134*  K 5.1 4.5  --  4.7  CL 104 100*  --  98*  CO2 26 28  --  25  GLUCOSE 87 85  --  95  BUN 19 22*  --  14  CREATININE 1.12 1.38*  --  1.23  CALCIUM 9.7 9.3  --  8.9  MG 2.0  --  1.8  --   AST 48*  --   --   --   ALT 37  --   --   --   ALKPHOS 76  --   --   --   BILITOT 0.6  --   --   --     ------------------------------------------------------------------------------------------------------------------  Recent Labs  12/22/15 0540  CHOL 200  HDL 52  LDLCALC 117*  TRIG 153*  CHOLHDL 3.8    Lab Results  Component Value Date   HGBA1C 5.9* 12/21/2015   ------------------------------------------------------------------------------------------------------------------ No results for input(s): TSH, T4TOTAL, T3FREE, THYROIDAB in the last 72 hours.  Invalid input(s): FREET3 ------------------------------------------------------------------------------------------------------------------ No results for input(s): VITAMINB12, FOLATE, FERRITIN, TIBC, IRON, RETICCTPCT in the last 72 hours.  Coagulation profile  Recent Labs Lab 12/21/15 1551  INR 1.10    No results for input(s): DDIMER in the last 72 hours.  Cardiac Enzymes No results for input(s): CKMB, TROPONINI, MYOGLOBIN in the last 168 hours.  Invalid input(s): CK ------------------------------------------------------------------------------------------------------------------ No results found for: BNP  Time Spent in minutes  35  Pleas Koch, MD [facility] [phone]

## 2015-12-24 NOTE — Progress Notes (Addendum)
[facility]   Reason for visit: Follow up on septic arthritis  Interval History: his culture has remained negative, he remains afebrile.  He has been on vancomycin and ceftriaxone.  Not interested in rehab.  Was seen by the social worker and he expressed concern with his partner at home giving him tainted food and concern that she may inappropriately use any line he has.  He though is still not interested in placement. Discussed via sign interpreter.    Physical Exam: Constitutional:  Filed Vitals:   12/23/15 2052 12/24/15 0521  BP: 107/73 107/76  Pulse: 64 65  Temp: 98.3 F (36.8 C) 98.3 F (36.8 C)  Resp: 18 18   patient appears in NAD Eyes: anicteric HENT: no thrush Respiratory: Normal respiratory effort; CTA B Cardiovascular: RRR GI: soft, nt, nd  Review of Systems: Constitutional: negative for chills Gastrointestinal: negative for nausea and vomiting  Lab Results  Component Value Date   WBC 6.2 12/23/2015   HGB 11.5* 12/23/2015   HCT 35.7* 12/23/2015   MCV 92.5 12/23/2015   PLT 162 12/23/2015    Lab Results  Component Value Date   CREATININE 1.23 12/23/2015   BUN 14 12/23/2015   NA 134* 12/23/2015   K 4.7 12/23/2015   CL 98* 12/23/2015   CO2 25 12/23/2015    Lab Results  Component Value Date   ALT 37 12/21/2015   AST 48* 12/21/2015   ALKPHOS 76 12/21/2015     Microbiology: Recent Results (from the past 240 hour(s))  Culture, body fluid-bottle     Status: None   Collection Time: 12/17/15  2:54 PM  Result Value Ref Range Status   Specimen Description SYNOVIAL RIGHT KNEE  Final   Special Requests BOTTLES DRAWN AEROBIC AND ANAEROBIC 5CC  Final   Culture   Final    NO GROWTH 5 DAYS Performed at [facility]    Report Status 12/22/2015 FINAL  Final  Gram stain     Status: None   Collection Time: 12/17/15  2:54 PM  Result Value Ref Range Status   Specimen Description SYNOVIAL  Final   Special Requests NONE  Final     Gram Stain   Final    ABUNDANT WBC PRESENT, PREDOMINANTLY PMN NO ORGANISMS SEEN Performed at [facility]    Report Status 12/17/2015 FINAL  Final  Surgical pcr screen     Status: None   Collection Time: 12/22/15  2:25 AM  Result Value Ref Range Status   MRSA, PCR NEGATIVE NEGATIVE Final   Staphylococcus aureus NEGATIVE NEGATIVE Final    Comment:        The Xpert SA Assay (FDA approved for NASAL specimens in patients over 65 years of age), is one component of a comprehensive surveillance program.  Test performance has been validated by [facility] for patients greater than or equal to 37 year old. It is not intended to diagnose infection nor to guide or monitor treatment.   Culture, body fluid-bottle     Status: None (Preliminary result)   Collection Time: 12/22/15  3:35 PM  Result Value Ref Range Status   Specimen Description SYNOVIAL RIGHT KNEE  Final   Special Requests BOTTLES DRAWN AEROBIC ONLY 5CC  Final   Culture NO GROWTH < 24 HOURS  Final   Report Status PENDING  Incomplete  Gram stain     Status: None   Collection Time: 12/22/15  3:35 PM  Result Value Ref Range  Status   Specimen Description SYNOVIAL RIGHT KNEE  Final   Special Requests NONE  Final   Gram Stain   Final    ABUNDANT WBC PRESENT,BOTH PMN AND MONONUCLEAR NO ORGANISMS SEEN    Report Status 12/22/2015 FINAL  Final    Impression/Plan:  1. Septic arthritis, culture negative - crystals were negative, 61,000 WBCs, purulence in OR concerning for infection.   With concerns of home IV therapy with his wife administering it, some noted congnitive issues with her and him being blind and deaf and her deaf, I do not feel home IV therapy is possible and more possible harm than benefit from it.  He though refuses to consider a rehab facility for IV administration and therefore I feel oral antibiotics are his best option.  He was told that oral therapy is suboptimal compared to IV therapy and relapse is  more possible and he understands the risk.    Therefore, I recommend levaquin 750 mg daily + doxycycline 100 mg twice a day, both for 4 weeks at discharge.  Continue IV therapy while here.    He should have follow up with us and/or Dr. Eulah PontMurphy in 1-2 weeks.  We will arrange follow up with us.    2. Will do routine screening for HIV, hepatitis C.   3. transaminitis - mild, would recheck as outpatient.  May be medication.    4.  Safety at home - Social Worker to call appropriate services.

## 2015-12-24 NOTE — Care Management Important Message (Signed)
Important Message  Patient Details  Name: Martin MollLarry Rood MRN: [id number] Date of Birth: [birth year]   Medicare Important Message Given:  Yes    Lamya Lausch P Detravion Tester 12/24/2015, 3:20 PM
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
## 2015-12-25 DIAGNOSIS — E038 Other specified hypothyroidism: Secondary | ICD-10-CM

## 2015-12-25 LAB — HIV ANTIBODY (ROUTINE TESTING W REFLEX): HIV SCREEN 4TH GENERATION: NONREACTIVE

## 2015-12-25 MED ORDER — DOXYCYCLINE HYCLATE 100 MG PO CAPS: 100.0000 mg | ORAL_CAPSULE | Freq: Two times a day (BID) | ORAL | Status: DC

## 2015-12-25 MED ORDER — COLCHICINE 0.6 MG PO TABS: 0.6000 mg | ORAL_TABLET | Freq: Every day | ORAL | Status: DC

## 2015-12-25 MED ORDER — LEVOFLOXACIN 750 MG PO TABS
750.0000 mg | ORAL_TABLET | Freq: Every day | ORAL | Status: DC
Start: 2015-12-25 — End: 2017-03-24

## 2015-12-25 MED ORDER — OXYCODONE-ACETAMINOPHEN 5-325 MG PO TABS: 1.0000 | ORAL_TABLET | Freq: Four times a day (QID) | ORAL | Status: DC | PRN

## 2015-12-25 NOTE — Care Management Note (Signed)
Case Management Note  Patient Details  Name: Darletta MollLarry Jeanpaul MRN: [id number] Date of Birth: [birth year]  Subjective/Objective:     60 yr old male s/p right knee washou and arthroscopy.               Action/Plan:  Home health has been arranged for patient and DME ordered. All arrangements have been provided to patient through interpreter because patient is legally blind and deaf. Appointments have been arranged for followup.  Expected Discharge Date:    12/25/15              Expected Discharge Plan:   home with home health  In-House Referral:     Discharge planning Services  CM Consult  Post Acute Care Choice:  Home Health Choice offered to:     DME Arranged:  3-N-1, Walker rolling DME Agency:  [facility]  HH Arranged:  RN, PT, Nurse's Aide, Social Work [employer] Agency:  [facility]  Status of Service:  Completed, signed off  Medicare Important Message Given:  Yes Date Medicare IM Given:    Medicare IM give by:    Date Additional Medicare IM Given:    Additional Medicare Important Message give by:     If discussed at Long Length of Stay Meetings, dates discussed:    Additional Comments:  Durenda GuthrieBrady, Sirenity Shew Naomi, RN 12/25/2015, 11:18 AM

## 2015-12-25 NOTE — Clinical Social Work Note (Addendum)
CSW spoke to patient to confirm patient's address for discharge today.  Address confirmed.  Interpreter services at bedside to assist.  CSW spoke to community ASL Aileen PilotKelle Owens [phone] in regards to follow-up appointments and transportation arrangements.  Laurin CoderKelle will assist patient with transportation to and from appointments.  Laurin CoderKelle will also assist patient with hardscripts- [facility] [address]  CSW completed SCAT application and faxed in for review.  Face-to-face interview needed prior to being active.  Patient and interpreter services made aware.  Medicaid transportation will assist until the application is reviewed/approved.  CSW arranged taxi transportation for patient's wife who will be taking patient's DME (EMS cannot take). Taxi will call the unit and CSW has arranged with Diplomatic Services operational officersecretary for wife to be escorted down by staff.  Vickii PennaGina Brentlee Sciara, LCSW [phone]  5N1-9; 2S 15-16 and [facility] Licensed Clinical Social Worker

## 2015-12-25 NOTE — Progress Notes (Signed)
Pt picked up by [facility] to be transported off to disposition. Pt transported off unit via stretcher with spouse and belongings to the side. Pt friend called and notified off pt been on his way home and she said she will meet them home. Martin BucyP. Amo Clemente Dewey RN
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
## 2015-12-25 NOTE — Progress Notes (Signed)
Pt discharge education and instructions complete with pt and spouse using the interpreter at bedside. All voices understanding and denied any questions. Pt dsg changed as requested by MD. Pt handed his prescriptions for colchicine, doxycycline, levaquin and percocet. Pt IV removed; and pt sitting up in bed waiting for [facility] to come transport him to disposition. Will continue to monitor till pt picked up. P. Seleta RhymesAmo Gracyn Santillanes RN

## 2015-12-26 DIAGNOSIS — H913 Deaf nonspeaking, not elsewhere classified: Secondary | ICD-10-CM | POA: Diagnosis not present

## 2015-12-26 DIAGNOSIS — E43 Unspecified severe protein-calorie malnutrition: Secondary | ICD-10-CM | POA: Diagnosis not present

## 2015-12-26 DIAGNOSIS — M150 Primary generalized (osteo)arthritis: Secondary | ICD-10-CM | POA: Diagnosis not present

## 2015-12-26 DIAGNOSIS — E039 Hypothyroidism, unspecified: Secondary | ICD-10-CM | POA: Diagnosis not present

## 2015-12-26 DIAGNOSIS — H548 Legal blindness, as defined in USA: Secondary | ICD-10-CM | POA: Diagnosis not present

## 2015-12-26 DIAGNOSIS — G894 Chronic pain syndrome: Secondary | ICD-10-CM | POA: Diagnosis not present

## 2015-12-26 DIAGNOSIS — M009 Pyogenic arthritis, unspecified: Secondary | ICD-10-CM | POA: Diagnosis not present

## 2015-12-26 LAB — HEPATITIS C ANTIBODY (REFLEX): HCV AB: 0.2 {s_co_ratio} (ref 0.0–0.9)

## 2015-12-26 LAB — HCV COMMENT:

## 2015-12-27 LAB — CULTURE, BODY FLUID-BOTTLE

## 2015-12-27 LAB — CULTURE, BODY FLUID W GRAM STAIN -BOTTLE: Culture: NO GROWTH

## 2015-12-30 DIAGNOSIS — E43 Unspecified severe protein-calorie malnutrition: Secondary | ICD-10-CM | POA: Diagnosis not present

## 2015-12-30 DIAGNOSIS — S83241D Other tear of medial meniscus, current injury, right knee, subsequent encounter: Secondary | ICD-10-CM | POA: Diagnosis not present

## 2015-12-30 DIAGNOSIS — M009 Pyogenic arthritis, unspecified: Secondary | ICD-10-CM | POA: Diagnosis not present

## 2015-12-30 DIAGNOSIS — H913 Deaf nonspeaking, not elsewhere classified: Secondary | ICD-10-CM | POA: Diagnosis not present

## 2015-12-30 DIAGNOSIS — S83281D Other tear of lateral meniscus, current injury, right knee, subsequent encounter: Secondary | ICD-10-CM | POA: Diagnosis not present

## 2015-12-30 DIAGNOSIS — H548 Legal blindness, as defined in USA: Secondary | ICD-10-CM | POA: Diagnosis not present

## 2015-12-30 DIAGNOSIS — G894 Chronic pain syndrome: Secondary | ICD-10-CM | POA: Diagnosis not present

## 2015-12-30 DIAGNOSIS — M150 Primary generalized (osteo)arthritis: Secondary | ICD-10-CM | POA: Diagnosis not present

## 2016-01-01 DIAGNOSIS — G894 Chronic pain syndrome: Secondary | ICD-10-CM | POA: Diagnosis not present

## 2016-01-01 DIAGNOSIS — H913 Deaf nonspeaking, not elsewhere classified: Secondary | ICD-10-CM | POA: Diagnosis not present

## 2016-01-01 DIAGNOSIS — M009 Pyogenic arthritis, unspecified: Secondary | ICD-10-CM | POA: Diagnosis not present

## 2016-01-01 DIAGNOSIS — H548 Legal blindness, as defined in USA: Secondary | ICD-10-CM | POA: Diagnosis not present

## 2016-01-01 DIAGNOSIS — M150 Primary generalized (osteo)arthritis: Secondary | ICD-10-CM | POA: Diagnosis not present

## 2016-01-01 DIAGNOSIS — E43 Unspecified severe protein-calorie malnutrition: Secondary | ICD-10-CM | POA: Diagnosis not present

## 2016-01-05 DIAGNOSIS — H913 Deaf nonspeaking, not elsewhere classified: Secondary | ICD-10-CM | POA: Diagnosis not present

## 2016-01-05 DIAGNOSIS — M150 Primary generalized (osteo)arthritis: Secondary | ICD-10-CM | POA: Diagnosis not present

## 2016-01-05 DIAGNOSIS — M009 Pyogenic arthritis, unspecified: Secondary | ICD-10-CM | POA: Diagnosis not present

## 2016-01-05 DIAGNOSIS — G894 Chronic pain syndrome: Secondary | ICD-10-CM | POA: Diagnosis not present

## 2016-01-05 DIAGNOSIS — H548 Legal blindness, as defined in USA: Secondary | ICD-10-CM | POA: Diagnosis not present

## 2016-01-05 DIAGNOSIS — E43 Unspecified severe protein-calorie malnutrition: Secondary | ICD-10-CM | POA: Diagnosis not present

## 2016-01-06 ENCOUNTER — Inpatient Hospital Stay: Payer: Medicare Other | Admitting: Internal Medicine

## 2016-01-08 ENCOUNTER — Inpatient Hospital Stay: Payer: Medicare Other

## 2016-01-12 ENCOUNTER — Ambulatory Visit: Payer: Medicare Other | Admitting: Internal Medicine

## 2016-01-12 DIAGNOSIS — H548 Legal blindness, as defined in USA: Secondary | ICD-10-CM | POA: Diagnosis not present

## 2016-01-12 DIAGNOSIS — H913 Deaf nonspeaking, not elsewhere classified: Secondary | ICD-10-CM | POA: Diagnosis not present

## 2016-01-12 DIAGNOSIS — M009 Pyogenic arthritis, unspecified: Secondary | ICD-10-CM | POA: Diagnosis not present

## 2016-01-12 DIAGNOSIS — M150 Primary generalized (osteo)arthritis: Secondary | ICD-10-CM | POA: Diagnosis not present

## 2016-01-12 DIAGNOSIS — E43 Unspecified severe protein-calorie malnutrition: Secondary | ICD-10-CM | POA: Diagnosis not present

## 2016-01-12 DIAGNOSIS — G894 Chronic pain syndrome: Secondary | ICD-10-CM | POA: Diagnosis not present

## 2016-01-13 DIAGNOSIS — G894 Chronic pain syndrome: Secondary | ICD-10-CM | POA: Diagnosis not present

## 2016-01-13 DIAGNOSIS — H913 Deaf nonspeaking, not elsewhere classified: Secondary | ICD-10-CM | POA: Diagnosis not present

## 2016-01-13 DIAGNOSIS — E43 Unspecified severe protein-calorie malnutrition: Secondary | ICD-10-CM | POA: Diagnosis not present

## 2016-01-13 DIAGNOSIS — M150 Primary generalized (osteo)arthritis: Secondary | ICD-10-CM | POA: Diagnosis not present

## 2016-01-13 DIAGNOSIS — M009 Pyogenic arthritis, unspecified: Secondary | ICD-10-CM | POA: Diagnosis not present

## 2016-01-13 DIAGNOSIS — H548 Legal blindness, as defined in USA: Secondary | ICD-10-CM | POA: Diagnosis not present

## 2016-01-18 ENCOUNTER — Ambulatory Visit: Payer: Medicare Other | Attending: Internal Medicine | Admitting: Internal Medicine

## 2016-01-18 ENCOUNTER — Encounter: Payer: Self-pay | Admitting: Internal Medicine

## 2016-01-18 VITALS — BP 102/76 | HR 65 | Temp 97.5°F | Resp 18 | Ht 72.0 in | Wt 115.4 lb

## 2016-01-18 DIAGNOSIS — E039 Hypothyroidism, unspecified: Secondary | ICD-10-CM | POA: Diagnosis not present

## 2016-01-18 DIAGNOSIS — Z79899 Other long term (current) drug therapy: Secondary | ICD-10-CM | POA: Diagnosis not present

## 2016-01-18 DIAGNOSIS — G894 Chronic pain syndrome: Secondary | ICD-10-CM | POA: Diagnosis not present

## 2016-01-18 DIAGNOSIS — E46 Unspecified protein-calorie malnutrition: Secondary | ICD-10-CM | POA: Insufficient documentation

## 2016-01-18 DIAGNOSIS — M109 Gout, unspecified: Secondary | ICD-10-CM | POA: Insufficient documentation

## 2016-01-18 DIAGNOSIS — Z0001 Encounter for general adult medical examination with abnormal findings: Secondary | ICD-10-CM | POA: Insufficient documentation

## 2016-01-18 DIAGNOSIS — K5981 Ogilvie syndrome: Secondary | ICD-10-CM

## 2016-01-18 DIAGNOSIS — K5909 Other constipation: Secondary | ICD-10-CM | POA: Diagnosis not present

## 2016-01-18 DIAGNOSIS — M009 Pyogenic arthritis, unspecified: Secondary | ICD-10-CM | POA: Insufficient documentation

## 2016-01-18 DIAGNOSIS — H548 Legal blindness, as defined in USA: Secondary | ICD-10-CM | POA: Diagnosis not present

## 2016-01-18 DIAGNOSIS — H9190 Unspecified hearing loss, unspecified ear: Secondary | ICD-10-CM | POA: Diagnosis not present

## 2016-01-18 DIAGNOSIS — K598 Other specified functional intestinal disorders: Secondary | ICD-10-CM

## 2016-01-18 LAB — BASIC METABOLIC PANEL
BUN: 23 mg/dL (ref 7–25)
CHLORIDE: 101 mmol/L (ref 98–110)
CO2: 30 mmol/L (ref 20–31)
CREATININE: 1.2 mg/dL (ref 0.70–1.33)
Calcium: 9.7 mg/dL (ref 8.6–10.3)
Glucose, Bld: 98 mg/dL (ref 65–99)
POTASSIUM: 4.3 mmol/L (ref 3.5–5.3)
SODIUM: 139 mmol/L (ref 135–146)

## 2016-01-18 LAB — CBC WITH DIFFERENTIAL/PLATELET
BASOS ABS: 37 {cells}/uL (ref 0–200)
BASOS PCT: 1 %
EOS ABS: 74 {cells}/uL (ref 15–500)
Eosinophils Relative: 2 %
HEMATOCRIT: 34.4 % — AB (ref 38.5–50.0)
HEMOGLOBIN: 11.3 g/dL — AB (ref 13.2–17.1)
LYMPHS ABS: 1591 {cells}/uL (ref 850–3900)
Lymphocytes Relative: 43 %
MCH: 30.1 pg (ref 27.0–33.0)
MCHC: 32.8 g/dL (ref 32.0–36.0)
MCV: 91.5 fL (ref 80.0–100.0)
MONO ABS: 222 {cells}/uL (ref 200–950)
MPV: 10.1 fL (ref 7.5–12.5)
Monocytes Relative: 6 %
NEUTROS ABS: 1776 {cells}/uL (ref 1500–7800)
Neutrophils Relative %: 48 %
Platelets: 145 10*3/uL (ref 140–400)
RBC: 3.76 MIL/uL — ABNORMAL LOW (ref 4.20–5.80)
RDW: 16.6 % — ABNORMAL HIGH (ref 11.0–15.0)
WBC: 3.7 10*3/uL — ABNORMAL LOW (ref 3.8–10.8)

## 2016-01-18 LAB — T4, FREE: Free T4: 0.5 ng/dL — ABNORMAL LOW (ref 0.8–1.8)

## 2016-01-18 LAB — TSH: TSH: 72.54 mIU/L — ABNORMAL HIGH (ref 0.40–4.50)

## 2016-01-18 MED ORDER — GABAPENTIN 300 MG PO CAPS: 300.0000 mg | ORAL_CAPSULE | Freq: Three times a day (TID) | ORAL | Status: DC

## 2016-01-18 MED ORDER — SENNA 8.6 MG PO TABS: 1.0000 | ORAL_TABLET | Freq: Two times a day (BID) | ORAL | Status: DC | PRN

## 2016-01-18 MED ORDER — LEVOTHYROXINE SODIUM 125 MCG PO TABS: 125.0000 ug | ORAL_TABLET | Freq: Every day | ORAL | Status: DC

## 2016-01-18 MED ORDER — ONDANSETRON HCL 4 MG PO TABS: 4.0000 mg | ORAL_TABLET | Freq: Three times a day (TID) | ORAL | Status: DC | PRN

## 2016-01-18 NOTE — Progress Notes (Signed)
Martin [name], is a 60 y.o. male  ZOX:[id number]  [id number]:[id number]  DOB - [birth year]  CC:  Chief Complaint  Patient presents with  . Hospitalization Follow-up    Knee       HPI: Martin [name] is a 60 y.o. 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    Pt was recently hospitalized 3/13-3/17 for right septic arthritis, sp orthoscopic eval 12/22/15.  Pt initially started on iv abx, was recommended to continue IV abx at SNF, but declined, so discharged home w/ po abx, Levofloxacin and doxycyline x 1 month.  He states he is currently still on them.  He says his pain is much improved, and denies any acute problems.   He currenlty does not have a pcp.  He c/o of constant bloating, says his bms are improving, sometimes daily, sometimes intermittant.  Denies hard stool, soft/mod consistency, denies diarrhea, denies brbpr/hematochezia/melena.  Patient has No headache, No chest pain, No abdominal pain - No Nausea, No new weakness tingling or numbness, No Cough - SOB.   Pt states SW has been to house, and wife dispensing medications appropriately now.  Pt is here w/ his sign-language interpreter.  No Known Allergies Past Medical History  Diagnosis Date  . Deaf   . Legally blind   . Gout   . Hypothyroidism   . Chronic constipation    Current Outpatient Prescriptions on File Prior to Visit  Medication Sig Dispense Refill  . bimatoprost (LUMIGAN) 0.01 % SOLN Place 1 drop into both eyes at bedtime.    . colchicine 0.6 MG tablet Take 1 tablet (0.6 mg total) by mouth daily. 60 tablet 0  . DORZOLAMIDE HCL-TIMOLOL MAL OP Apply 1 drop to eye 2 (two) times daily.    Marland Kitchen doxycycline (VIBRAMYCIN) 100 MG capsule Take 1 capsule (100 mg total) by mouth 2 (two) times daily. 60 capsule 0  . HYDROcodone-acetaminophen (NORCO/VICODIN) 5-325 MG tablet Take 1 tablet by mouth every 6 (six) hours as needed for moderate pain. 15 tablet 0  . levofloxacin (LEVAQUIN) 750  MG tablet Take 1 tablet (750 mg total) by mouth daily. 30 tablet 0  . Multiple Vitamin (MULTIVITAMIN WITH MINERALS) TABS tablet Take 1 tablet by mouth daily.    Marland Kitchen oxyCODONE-acetaminophen (PERCOCET/ROXICET) 5-325 MG tablet Take 1 tablet by mouth every 6 (six) hours as needed for severe pain. 30 tablet 0   No current facility-administered medications on file prior to visit.   Family History  Problem Relation Age of Onset  . Family history unknown: Yes   Social History   Social History  . Marital Status: Married    Spouse Name: N/A  . Number of Children: N/A  . Years of Education: N/A   Occupational History  . Not on file.   Social History Main Topics  . Smoking status: Never Smoker   . Smokeless tobacco: Never Used  . Alcohol Use: No  . Drug Use: No  . Sexual Activity: Yes   Other Topics Concern  . Not on file   Social History Narrative    Review of Systems: Constitutional: Negative for fever, chills, diaphoresis, activity change, appetite change and fatigue. HENT: Negative for ear pain, nosebleeds, congestion, facial swelling, rhinorrhea, neck pain, neck stiffness and ear discharge.  Eyes: Negative for pain, discharge, redness, itching and visual disturbance. Respiratory: Negative for cough, choking, chest tightness, shortness of breath, wheezing and stridor.  Cardiovascular: Negative for chest pain, palpitations and  leg swelling. Gastrointestinal: Negative for abdominal distention. Genitourinary: Negative for dysuria, urgency, frequency, hematuria, flank pain, decreased urine volume, difficulty urinating and dyspareunia.  Musculoskeletal: Negative for back pain, joint swelling, arthralgia and gait problem. Neurological: Negative for dizziness, tremors, seizures, syncope, facial asymmetry, speech difficulty, weakness, light-headedness, numbness and headaches.  Hematological: Negative for adenopathy. Does not bruise/bleed easily. Psychiatric/Behavioral: Negative for  hallucinations, behavioral problems, confusion, dysphoric mood, decreased concentration and agitation.    Objective:   Filed Vitals:   01/18/16 0915  BP: 102/76  Pulse: 65  Temp: 97.5 F (36.4 C)  Resp: 18    Physical Exam: Constitutional: Patient appears well-developed and well-nourished. No distress. AAOx3.  Thin appearing man, here w/ interpreter.   Ambulated w/ bilateral crutches w/o difficulty. HENT: Normocephalic, atraumatic, External right and left ear normal. Oropharynx is clear and moist.  Eyes: Conjunctivae and EOM are normal. PERRL, no scleral icterus. Neck: Normal ROM. Neck supple. No JVD. No tracheal deviation. No thyromegaly. CVS: RRR, S1/S2 +, no murmurs, no gallops, no carotid bruit.  Pulmonary: Effort and breath sounds normal, no stridor, rhonchi, wheezes, rales.  Abdominal: Soft. Distended,  BS +, no tenderness, rebound or guarding.  Musculoskeletal: Normal range of motion. No edema and no tenderness.  Lymphadenopathy: No lymphadenopathy noted, cervical LE: right and left knee good ROM, mild ttp right knee, but no obvious effusions. Neuro: Alert. Normal reflexes, muscle tone coordination. No cranial nerve deficit grossly. Skin: Skin is warm and dry. No rash noted. Not diaphoretic. No erythema. No pallor. Psychiatric: Normal mood and affect. Behavior, judgment, thought content normal.  Lab Results  Component Value Date   WBC 6.2 12/23/2015   HGB 11.5* 12/23/2015   HCT 35.7* 12/23/2015   MCV 92.5 12/23/2015   PLT 162 12/23/2015   Lab Results  Component Value Date   CREATININE 1.23 12/23/2015   BUN 14 12/23/2015   NA 134* 12/23/2015   K 4.7 12/23/2015   CL 98* 12/23/2015   CO2 25 12/23/2015    Lab Results  Component Value Date   HGBA1C 5.9* 12/21/2015   Lipid Panel     Component Value Date/Time   CHOL 200 12/22/2015 0540   TRIG 153* 12/22/2015 0540   HDL 52 12/22/2015 0540   CHOLHDL 3.8 12/22/2015 0540   VLDL 31 12/22/2015 0540   LDLCALC 117*  12/22/2015 0540      Right knee fluid cxs.  ntd x 2 Assessment and plan:   1. Pyogenic arthritis of right knee joint, due to unspecified organism [facility]), right knee- - done w/ abx on 4/13, reminded to f/u w/ his ortho Dr Eulah Pont (4/12)and ID Dr Orvan Falconer (appt 4/18), appears resolving, cultures remain ntd  - Basic Metabolic Panel - CBC with Differential  2. Hypothyroidism, unspecified hypothyroidism type Renewed synthroid, will rechk levels. chk/ - TSH - T4, Free   3. Chronic pain syndrome - renewed neurontin  4. ?olgivie's syndrome - renewed senna, recd high fiber diet - gi referral made  5.  Mod protein calorie malnutrition - encouraged to have boost rx filled for protein supplementation, gave him coupons for ensure as well.  6. Legally blind, Deaf, unspecified laterality  - very near sighted, here w/ sign-language intepreter.  Return in about 3 months (around 04/18/2016).  The patient was given clear instructions to go to ER or return to medical center if symptoms don't improve, worsen or new problems develop. The patient verbalized understanding. The patient was told to call to get lab results if they haven't  heard anything in the next week.      Pete Glatterawn T Salil Raineri, MD, MBA/MHA [facility] [place], [place] [phone]   01/18/2016, 9:36 AM

## 2016-01-18 NOTE — Patient Instructions (Signed)
It was a pleasure meeting you.  Septic Arthritis Septic arthritis is inflammation of a joint that results from an infection. The infection is caused by germs (bacteria) that enter the joint. Septic arthritis often starts with a painful joint that suddenly gets hot and red. The knee and hip joints are most often affected, but other joints may become infected. Usually, just one joint is infected. CAUSES  Often, septic arthritis is caused by Staphylococcus bacteria. Other bacteria that may lead to the condition include those that cause:   Fungal infections.  Sexually transmitted infections (STIs).  Tuberculosis. Infection can spread to your joint directly if you:   Have an infection somewhere else in your body that is carried to the joint by your blood. This is the most common cause of septic arthritis.  Have an open wound near a joint.  Have a needle put into your joint.  Have joint surgery. RISK FACTORS You may have a higher risk for septic arthritis if you:   Have an artificial joint.  Have a blood infection.  Had a recent joint surgery or procedure.  Had a recent joint injury.  Have diabetes.  Have arthritis.  Have a condition that weakens your body's defense system (immune system).  Use intravenous (IV) drugs.  Have gonorrhea.  SIGNS AND SYMPTOMS Signs and symptoms of septic arthritis may include:   Swelling at the joint.  Severe pain in the joint.  Redness and warmth in the joint.  Being unable to move the joint.  Fever and chills.  DIAGNOSIS  Your health care provider may do a physical exam. You may also have tests to confirm the diagnosis. These may include:  Blood tests.  Fluid removal from your joint to look for signs of infection (synovial fluid analysis).  X-ray. TREATMENT  You may need to have fluid drained from the joint every day for several days to relieve pain. You may also start treatment with antibiotic medicine. The antibiotics may be  given by IV or mouth.  HOME CARE INSTRUCTIONS   If you were prescribed an antibiotic medicine, finish it all even if you start to feel better.  Take medicines only as directed by your health care provider.  Use a cool compress on your joint to relieve pain.  Rest your joint until the pain and swelling go away.  Elevate the affected joint when resting.  After the pain and swelling are gone, do light exercises as directed by your health care provider.  Keep all follow-up visits as directed by your health care provider. This is important. SEEK MEDICAL CARE IF:  You have pain that is not controlled with medicine.  You have a fever. SEEK IMMEDIATE MEDICAL CARE IF: You have signs of worsening infection in your joint. Watch for:  Very bad pain.  Redness.  Warmth.  Swelling. MAKE SURE YOU:  Understand these instructions.  Will watch your condition.  Will get help right away if you are not doing well or get worse.   This information is not intended to replace advice given to you by your health care provider. Make sure you discuss any questions you have with your health care provider.   Document Released: 12/17/2002 Document Revised: 10/17/2014 Document Reviewed: 11/29/2013 Elsevier Interactive Patient Education [employer]2016 Elsevier Inc.

## 2016-01-18 NOTE — Progress Notes (Signed)
Patient is here for HFU for Knee Pain  Patient complains of left knee pain, scaled currently at a 7.  Patient request refills on Gabapentin, Levothyroxine and Zofran.

## 2016-01-19 DIAGNOSIS — M009 Pyogenic arthritis, unspecified: Secondary | ICD-10-CM | POA: Diagnosis not present

## 2016-01-19 DIAGNOSIS — G894 Chronic pain syndrome: Secondary | ICD-10-CM | POA: Diagnosis not present

## 2016-01-19 DIAGNOSIS — M150 Primary generalized (osteo)arthritis: Secondary | ICD-10-CM | POA: Diagnosis not present

## 2016-01-19 DIAGNOSIS — E43 Unspecified severe protein-calorie malnutrition: Secondary | ICD-10-CM | POA: Diagnosis not present

## 2016-01-19 DIAGNOSIS — H548 Legal blindness, as defined in USA: Secondary | ICD-10-CM | POA: Diagnosis not present

## 2016-01-19 DIAGNOSIS — H913 Deaf nonspeaking, not elsewhere classified: Secondary | ICD-10-CM | POA: Diagnosis not present

## 2016-01-20 DIAGNOSIS — S83281D Other tear of lateral meniscus, current injury, right knee, subsequent encounter: Secondary | ICD-10-CM | POA: Diagnosis not present

## 2016-01-20 DIAGNOSIS — S83241D Other tear of medial meniscus, current injury, right knee, subsequent encounter: Secondary | ICD-10-CM | POA: Diagnosis not present

## 2016-01-21 ENCOUNTER — Telehealth: Payer: Self-pay | Admitting: *Deleted

## 2016-01-21 DIAGNOSIS — M009 Pyogenic arthritis, unspecified: Secondary | ICD-10-CM | POA: Diagnosis not present

## 2016-01-21 DIAGNOSIS — M150 Primary generalized (osteo)arthritis: Secondary | ICD-10-CM | POA: Diagnosis not present

## 2016-01-21 DIAGNOSIS — E43 Unspecified severe protein-calorie malnutrition: Secondary | ICD-10-CM | POA: Diagnosis not present

## 2016-01-21 DIAGNOSIS — H913 Deaf nonspeaking, not elsewhere classified: Secondary | ICD-10-CM | POA: Diagnosis not present

## 2016-01-21 DIAGNOSIS — G894 Chronic pain syndrome: Secondary | ICD-10-CM | POA: Diagnosis not present

## 2016-01-21 DIAGNOSIS — H548 Legal blindness, as defined in USA: Secondary | ICD-10-CM | POA: Diagnosis not present

## 2016-01-21 NOTE — Telephone Encounter (Signed)
-----   Message from Pete Glatterawn T Langeland, MD sent at 01/19/2016  8:43 AM EDT ----- Please call patient and tell him labs stable compared to 3 wks ago. He needs to take his meds including the synthroid, because he is extremely hypothyroid.  We will rechk these labs in abt 2 months. Thanks.

## 2016-01-21 NOTE — Telephone Encounter (Signed)
Patient verified DOB Patients emergency contact is aware of labs being stable compared to 3 weeks ago. Patient is advised to take medications including levothyroxine due to hypothyroidism. Patient is aware of having his levels rechecked in 2 months. No further question or concerns at this time.

## 2016-01-26 ENCOUNTER — Ambulatory Visit: Payer: Medicare Other | Admitting: Internal Medicine

## 2016-01-28 ENCOUNTER — Telehealth: Payer: Self-pay | Admitting: Internal Medicine

## 2016-01-28 DIAGNOSIS — G894 Chronic pain syndrome: Secondary | ICD-10-CM | POA: Diagnosis not present

## 2016-01-28 DIAGNOSIS — H548 Legal blindness, as defined in USA: Secondary | ICD-10-CM | POA: Diagnosis not present

## 2016-01-28 DIAGNOSIS — H913 Deaf nonspeaking, not elsewhere classified: Secondary | ICD-10-CM | POA: Diagnosis not present

## 2016-01-28 DIAGNOSIS — M009 Pyogenic arthritis, unspecified: Secondary | ICD-10-CM | POA: Diagnosis not present

## 2016-01-28 DIAGNOSIS — M150 Primary generalized (osteo)arthritis: Secondary | ICD-10-CM | POA: Diagnosis not present

## 2016-01-28 DIAGNOSIS — E43 Unspecified severe protein-calorie malnutrition: Secondary | ICD-10-CM | POA: Diagnosis not present

## 2016-01-28 NOTE — Telephone Encounter (Signed)
Please, sent all the medications  To [facility] [address]  Thank You .

## 2016-02-01 ENCOUNTER — Ambulatory Visit: Payer: Medicare Other | Admitting: Internal Medicine

## 2016-02-02 DIAGNOSIS — H401113 Primary open-angle glaucoma, right eye, severe stage: Secondary | ICD-10-CM | POA: Diagnosis not present

## 2016-02-02 DIAGNOSIS — H401123 Primary open-angle glaucoma, left eye, severe stage: Secondary | ICD-10-CM | POA: Diagnosis not present

## 2016-02-02 DIAGNOSIS — Z0100 Encounter for examination of eyes and vision without abnormal findings: Secondary | ICD-10-CM | POA: Diagnosis not present

## 2016-02-04 DIAGNOSIS — E43 Unspecified severe protein-calorie malnutrition: Secondary | ICD-10-CM | POA: Diagnosis not present

## 2016-02-04 DIAGNOSIS — H548 Legal blindness, as defined in USA: Secondary | ICD-10-CM | POA: Diagnosis not present

## 2016-02-04 DIAGNOSIS — G894 Chronic pain syndrome: Secondary | ICD-10-CM | POA: Diagnosis not present

## 2016-02-04 DIAGNOSIS — M009 Pyogenic arthritis, unspecified: Secondary | ICD-10-CM | POA: Diagnosis not present

## 2016-02-04 DIAGNOSIS — H913 Deaf nonspeaking, not elsewhere classified: Secondary | ICD-10-CM | POA: Diagnosis not present

## 2016-02-04 DIAGNOSIS — M150 Primary generalized (osteo)arthritis: Secondary | ICD-10-CM | POA: Diagnosis not present

## 2016-02-24 DIAGNOSIS — S83241D Other tear of medial meniscus, current injury, right knee, subsequent encounter: Secondary | ICD-10-CM | POA: Diagnosis not present

## 2016-02-24 DIAGNOSIS — S83281D Other tear of lateral meniscus, current injury, right knee, subsequent encounter: Secondary | ICD-10-CM | POA: Diagnosis not present

## 2016-02-24 DIAGNOSIS — M25562 Pain in left knee: Secondary | ICD-10-CM | POA: Diagnosis not present

## 2016-03-10 ENCOUNTER — Inpatient Hospital Stay: Payer: Medicare Other | Admitting: Internal Medicine

## 2016-03-15 ENCOUNTER — Ambulatory Visit: Payer: Medicare Other | Admitting: Internal Medicine

## 2016-03-15 DIAGNOSIS — Z0289 Encounter for other administrative examinations: Secondary | ICD-10-CM

## 2016-03-16 NOTE — Telephone Encounter (Signed)
Per pt request, [facility] [address]

## 2016-04-22 DIAGNOSIS — H401113 Primary open-angle glaucoma, right eye, severe stage: Secondary | ICD-10-CM | POA: Diagnosis not present

## 2016-05-02 DIAGNOSIS — H401133 Primary open-angle glaucoma, bilateral, severe stage: Secondary | ICD-10-CM | POA: Diagnosis not present

## 2016-05-05 ENCOUNTER — Inpatient Hospital Stay: Payer: Medicare Other | Admitting: Infectious Disease

## 2016-05-16 DIAGNOSIS — H401133 Primary open-angle glaucoma, bilateral, severe stage: Secondary | ICD-10-CM | POA: Diagnosis not present

## 2016-06-17 DIAGNOSIS — H401133 Primary open-angle glaucoma, bilateral, severe stage: Secondary | ICD-10-CM | POA: Diagnosis not present

## 2016-07-29 DIAGNOSIS — H401133 Primary open-angle glaucoma, bilateral, severe stage: Secondary | ICD-10-CM | POA: Diagnosis not present

## 2016-08-08 DIAGNOSIS — H2512 Age-related nuclear cataract, left eye: Secondary | ICD-10-CM | POA: Diagnosis not present

## 2016-08-08 DIAGNOSIS — H401113 Primary open-angle glaucoma, right eye, severe stage: Secondary | ICD-10-CM | POA: Diagnosis not present

## 2016-08-08 DIAGNOSIS — H401123 Primary open-angle glaucoma, left eye, severe stage: Secondary | ICD-10-CM | POA: Diagnosis not present

## 2016-08-08 DIAGNOSIS — H4423 Degenerative myopia, bilateral: Secondary | ICD-10-CM | POA: Diagnosis not present

## 2016-08-08 DIAGNOSIS — H2511 Age-related nuclear cataract, right eye: Secondary | ICD-10-CM | POA: Diagnosis not present

## 2017-01-03 ENCOUNTER — Emergency Department (HOSPITAL_COMMUNITY): Payer: Medicare Other

## 2017-01-03 ENCOUNTER — Emergency Department (HOSPITAL_COMMUNITY)
Admission: EM | Admit: 2017-01-03 | Discharge: 2017-01-04 | Disposition: A | Discharge status: Home / Self Care | Payer: Medicare Other | Attending: Emergency Medicine | Admitting: Emergency Medicine

## 2017-01-03 ENCOUNTER — Encounter (HOSPITAL_COMMUNITY): Payer: Self-pay

## 2017-01-03 DIAGNOSIS — R935 Abnormal findings on diagnostic imaging of other abdominal regions, including retroperitoneum: Secondary | ICD-10-CM | POA: Diagnosis not present

## 2017-01-03 DIAGNOSIS — K598 Other specified functional intestinal disorders: Secondary | ICD-10-CM

## 2017-01-03 DIAGNOSIS — K5900 Constipation, unspecified: Secondary | ICD-10-CM | POA: Diagnosis present

## 2017-01-03 DIAGNOSIS — E039 Hypothyroidism, unspecified: Secondary | ICD-10-CM | POA: Diagnosis not present

## 2017-01-03 DIAGNOSIS — R109 Unspecified abdominal pain: Secondary | ICD-10-CM | POA: Diagnosis not present

## 2017-01-03 DIAGNOSIS — K56699 Other intestinal obstruction unspecified as to partial versus complete obstruction: Secondary | ICD-10-CM | POA: Diagnosis not present

## 2017-01-03 DIAGNOSIS — K5981 Ogilvie syndrome: Secondary | ICD-10-CM

## 2017-01-03 MED ORDER — FLEET ENEMA 7-19 GM/118ML RE ENEM
1.0000 | ENEMA | Freq: Once | RECTAL | Status: AC
  Administered 2017-01-03: 1 via RECTAL
  Filled 2017-01-03: qty 1

## 2017-01-03 MED ORDER — MAGNESIUM CITRATE PO SOLN
1.0000 | Freq: Once | ORAL | Status: AC
  Administered 2017-01-03: 1 via ORAL
  Filled 2017-01-03: qty 296

## 2017-01-03 NOTE — ED Notes (Signed)
Delay in lab draw  Pt receiving enema

## 2017-01-03 NOTE — ED Notes (Signed)
Assisted provider in disimpaction.

## 2017-01-03 NOTE — ED Provider Notes (Signed)
[facility]EMER[facility]ENCY DEPT Provider Note   CSN: [id number] Arrival date & time: 01/03/17  1455     History   Chief Complaint Chief Complaint  Patient presents with  . Abdominal Pain    HPI Martin [name] is a 61 y.o. male.  HPI  61 year old male with history of chronic constipation, deafness, and legally blind, who presents for evaluation of constipation and accompanying abdominal pain. Patient reports that he has had to strain to have very small bowel movements over the last couple of weeks. Last bowel movement was this morning, was very small, and very hard. Denies blood in his stool. Denies nausea or vomiting. He is still tolerating oral intake. Endorses diffuse abdominal tenderness that he describes as a squeezing, cramping sensation. Denies dysuria, frequency, or difficulty urinating. Not on stool softeners. Denies fevers, chills, chest pain, shortness of breath, other infectious symptoms. Pt is passing flatus. Denies history of abdominal surgeries or bowel obstruction.   Past Medical History:  Diagnosis Date  . Chronic constipation   . Deaf   . Gout   . Hypothyroidism   . Legally blind   . Ogilvie's syndrome   . Pyogenic arthritis of right knee joint [facility])     Patient Active Problem List   Diagnosis Date Noted  . Protein-calorie malnutrition, severe 12/23/2015  . Septic joint (HCC) 12/21/2015  . Chronic pain syndrome 12/21/2015  . Legally blind 12/21/2015  . Deaf 12/21/2015  . Hypothyroidism 12/21/2015  . Swelling of left hand 12/21/2015  . Constipation     Past Surgical History:  Procedure Laterality Date  . EYE SURGERY Right    "relieved fluid pressure and cleaned it out"  . KNEE ARTHROSCOPY Right 12/22/2015   Procedure: ARTHROSCOPY WASHOUT OF SEPTIC RIGHT KNEE;  Surgeon: Sheral Apley, MD;  Location: [facility] OR;  Service: Orthopedics;  Laterality: Right;  . KNEE SURGERY Left 2015/2016       Home Medications    Prior to Admission medications   Medication Sig  Start Date End Date Taking? Authorizing Provider  bimatoprost (LUMIGAN) 0.01 % SOLN Place 1 drop into both eyes at bedtime.    Historical Provider, MD  colchicine 0.6 MG tablet Take 1 tablet (0.6 mg total) by mouth daily. 12/25/15   Rhetta Mura, MD  DORZOLAMIDE HCL-TIMOLOL MAL OP Apply 1 drop to eye 2 (two) times daily.    Historical Provider, MD  doxycycline (VIBRAMYCIN) 100 MG capsule Take 1 capsule (100 mg total) by mouth 2 (two) times daily. 12/25/15   Rhetta Mura, MD  gabapentin (NEURONTIN) 300 MG capsule Take 1 capsule (300 mg total) by mouth 3 (three) times daily. 01/18/16   Pete Glatter, MD  HYDROcodone-acetaminophen (NORCO/VICODIN) 5-325 MG tablet Take 1 tablet by mouth every 6 (six) hours as needed for moderate pain. 12/17/15   Charlestine Night, PA-C  levofloxacin (LEVAQUIN) 750 MG tablet Take 1 tablet (750 mg total) by mouth daily. 12/25/15   Rhetta Mura, MD  levothyroxine (SYNTHROID, LEVOTHROID) 125 MCG tablet Take 1 tablet (125 mcg total) by mouth daily before breakfast. 01/18/16   Pete Glatter, MD  Multiple Vitamin (MULTIVITAMIN WITH MINERALS) TABS tablet Take 1 tablet by mouth daily.    Historical Provider, MD  ondansetron (ZOFRAN) 4 MG tablet Take 1 tablet (4 mg total) by mouth every 8 (eight) hours as needed for nausea or vomiting. 01/18/16   Pete Glatter, MD  oxyCODONE-acetaminophen (PERCOCET/ROXICET) 5-325 MG tablet Take 1 tablet by mouth every 6 (six) hours as needed for severe  pain. 12/25/15   Rhetta MuraJai-Gurmukh Samtani, MD  senna (SENOKOT) 8.6 MG TABS tablet Take 1 tablet (8.6 mg total) by mouth 2 (two) times daily as needed for mild constipation. 01/18/16   Pete Glatterawn T Langeland, MD    Family History Family History  Problem Relation Age of Onset  . Family history unknown: Yes    Social History Social History  Substance Use Topics  . Smoking status: Never Smoker  . Smokeless tobacco: Never Used  . Alcohol use No     Allergies   Patient has no known  allergies.   Review of Systems Review of Systems  Constitutional: Negative for chills and fever.  HENT: Negative for congestion.   Eyes: Negative for visual disturbance.  Respiratory: Negative for cough and shortness of breath.   Cardiovascular: Negative for chest pain.  Gastrointestinal: Positive for abdominal distention, abdominal pain and constipation. Negative for blood in stool, diarrhea, nausea and vomiting.  Genitourinary: Negative for difficulty urinating, dysuria and frequency.  Musculoskeletal: Negative for arthralgias, back pain and myalgias.  Skin: Negative for color change and rash.  Neurological: Negative for dizziness, syncope, light-headedness and headaches.  Psychiatric/Behavioral: Negative for agitation, behavioral problems and confusion.     Physical Exam Updated Vital Signs BP 112/79 (BP Location: Right Arm)   Pulse 85   Temp 97.6 F (36.4 C) (Oral)   Resp 16   Ht 6' (1.829 m)   Wt 61.2 kg   SpO2 100%   BMI 18.31 kg/m   Physical Exam  Constitutional: He is oriented to person, place, and time. He appears well-developed and well-nourished.  HENT:  Head: Normocephalic and atraumatic.  Eyes: Conjunctivae are normal. Pupils are equal, round, and reactive to light.  Neck: Normal range of motion. Neck supple.  Cardiovascular: Normal rate, regular rhythm, normal heart sounds and intact distal pulses.   No murmur heard. Pulmonary/Chest: Effort normal and breath sounds normal. No respiratory distress.  Abdominal: Soft. Bowel sounds are normal. He exhibits distension (nontympanic). There is tenderness (generalized).  Genitourinary:  Genitourinary Comments: Rectum distended with copious hard stool, no blood or masses  Musculoskeletal: He exhibits no edema.  Neurological: He is alert and oriented to person, place, and time. He exhibits normal muscle tone.  Skin: Skin is warm and dry.  Psychiatric: He has a normal mood and affect.  Nursing note and vitals  reviewed.    ED Treatments / Results  Labs (all labs ordered are listed, but only abnormal results are displayed) Labs Reviewed  I-STAT CHEM 8, ED - Abnormal; Notable for the following:       Result Value   BUN 26 (*)    Creatinine, Ser 1.40 (*)    Glucose, Bld 175 (*)    Calcium, Ion 1.05 (*)    Hemoglobin 11.6 (*)    HCT 34.0 (*)    All other components within normal limits    EKG  EKG Interpretation None       Radiology Dg Abd Portable 1 View  Result Date: 01/04/2017 CLINICAL DATA:  Acute onset of generalized abdominal pain. Initial encounter. EXAM: PORTABLE ABDOMEN - 1 VIEW COMPARISON:  Abdominal radiograph performed 12/21/2015 FINDINGS: Marked distention of colonic loops is again noted. Evaluation for free intra-abdominal air is very limited given colonic distention. 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 No significant stool is seen on the current study. No acute osseous abnormalities are identified. IMPRESSION: Marked distention of colonic loops again noted, reflecting the patient's Ogilvie's syndrome. Evaluation for free intra-abdominal  air is very limited on this single image given colonic distention. No significant stool seen. Electronically Signed   By: Roanna Raider M.D.   On: 01/04/2017 00:52    Procedures Procedures (including critical care time)  Medications Ordered in ED Medications  magnesium citrate solution 1 Bottle (1 Bottle Oral Given 01/03/17 2256)  sodium phosphate (FLEET) 7-19 GM/118ML enema 1 enema (1 enema Rectal Given 01/03/17 2256)     Initial Impression / Assessment and Plan / ED Course  I have reviewed the triage vital signs and the nursing notes.  Pertinent labs & imaging results that were available during my care of the patient were reviewed by me and considered in my medical decision making (see chart for details).     Generally well-appearing. Afebrile and hemodynamically stable. Patient reports diffuse abdominal  pain, and has mild tenderness to palpation as well as distention to his abdomen. Abdominal exam is non-tympanic, and patient is passing a small amount flatus.   Disimpaction performed at bedside, with small amount of hard stool removed from the rectal vault. Patient given a Fleet enema as well as mag citrate with subsequent large bowel movement. Chem 8 obtained with normal potassium, Cr 1.4. On review of the medical record, the pt has a fluctuating baseline but this is within the realm of what his renal function has been in the past.   1 view XR of the abdomen with large dilated loops of bowel. CT abdomen pelvis without contrast ordered for further evaluation. If pt does not have signs of obstruction, he can be discharged with prescriptions for stool softeners.   Care of pt transferred to Dr. Manus Gunning at 0100.   Care of pt overseen by my attending, Dr. Fayrene Fearing.   Final Clinical Impressions(s) / ED Diagnoses   Final diagnoses:  Constipation, unspecified constipation type    New Prescriptions New Prescriptions   No medications on file     Charlie Pitter, MD 01/04/17 1610    Rolland Porter, MD 01/04/17 0100

## 2017-01-03 NOTE — ED Notes (Signed)
Pt is blind and deaf.  RN requested interpretor be called.  Video interpretor set up however difficuilt to use b/c machine needed to be close to face however then video could not see his hands.  Physical interpertor arrived.

## 2017-01-03 NOTE — ED Triage Notes (Addendum)
Pt endorses constipation and abdominal pain since "last week" and has chronic hx of the same. Pt endorses 2 episodes of greenish vomiting today. pt states that he had small BM earlier today. Pt is deaf and legally blind. All information gathered with interpreter ipad. VSS. Pt denies taking any narcotic pain medications recently.

## 2017-01-03 NOTE — ED Notes (Signed)
Delay in lab draw pt using bedside commode 

## 2017-01-04 ENCOUNTER — Emergency Department (HOSPITAL_COMMUNITY): Payer: Medicare Other

## 2017-01-04 DIAGNOSIS — R109 Unspecified abdominal pain: Secondary | ICD-10-CM | POA: Diagnosis not present

## 2017-01-04 DIAGNOSIS — R935 Abnormal findings on diagnostic imaging of other abdominal regions, including retroperitoneum: Secondary | ICD-10-CM | POA: Diagnosis not present

## 2017-01-04 LAB — I-STAT CHEM 8, ED
BUN: 26 mg/dL — AB (ref 6–20)
CALCIUM ION: 1.05 mmol/L — AB (ref 1.15–1.40)
CHLORIDE: 105 mmol/L (ref 101–111)
CREATININE: 1.4 mg/dL — AB (ref 0.61–1.24)
Glucose, Bld: 175 mg/dL — ABNORMAL HIGH (ref 65–99)
HCT: 34 % — ABNORMAL LOW (ref 39.0–52.0)
Hemoglobin: 11.6 g/dL — ABNORMAL LOW (ref 13.0–17.0)
POTASSIUM: 3.7 mmol/L (ref 3.5–5.1)
Sodium: 141 mmol/L (ref 135–145)
TCO2: 23 mmol/L (ref 0–100)

## 2017-01-04 MED ORDER — POLYETHYLENE GLYCOL 3350 17 G PO PACK: 17.0000 g | PACK | Freq: Every day | ORAL | 0 refills | Status: DC

## 2017-01-04 NOTE — Discharge Instructions (Signed)
Take the medication for constipation as prescribed. Follow up in the GI office. Return to the ED if you develop new or worsening symptoms.

## 2017-01-04 NOTE — ED Notes (Signed)
Pt ambulated to the bathroom w/ the assistance of the crutches

## 2017-01-04 NOTE — ED Notes (Signed)
Patient transported to CT 

## 2017-01-04 NOTE — ED Notes (Signed)
Explained discharge instructions to pt w/ the assistance of sign Language interpretor, Karle PlumberBethany Jones of [employer], who assisted interpreting for the whole visit since 9:17pm.    Taxi voucher given due to disability and current medical condition.

## 2017-01-04 NOTE — ED Provider Notes (Signed)
Assumed care in signout from Dr. Fayrene FearingJames. Patient with history of Ogilvie syndrome presenting with abdominal pain and constipation. Had bowel movement after given magnesium citrate. Dilated colon on x-ray. CT scan pending.  CT scan shows chronically dilated colon without obstruction point.  D/w Dr. Evette CristalGanem of GI. He agrees that these findings appear to be chronic. As long as patient is feeling better he can be discharged with a bowel regimen. Follow up in the gastrointestinal office. Return precautions discussed.   Martin OctaveStephen Jandi Swiger, MD 01/04/17 [phone]

## 2017-01-04 NOTE — ED Notes (Signed)
Pt had bowel movement, Enema was successful.

## 2017-03-08 ENCOUNTER — Emergency Department (HOSPITAL_COMMUNITY): Payer: Medicare Other

## 2017-03-08 ENCOUNTER — Emergency Department (HOSPITAL_COMMUNITY)
Admission: EM | Admit: 2017-03-08 | Discharge: 2017-03-08 | Disposition: A | Discharge status: Home / Self Care | Payer: Medicare Other | Attending: Emergency Medicine | Admitting: Emergency Medicine

## 2017-03-08 ENCOUNTER — Encounter (HOSPITAL_COMMUNITY): Payer: Self-pay | Admitting: Emergency Medicine

## 2017-03-08 DIAGNOSIS — M25512 Pain in left shoulder: Secondary | ICD-10-CM | POA: Diagnosis not present

## 2017-03-08 DIAGNOSIS — E039 Hypothyroidism, unspecified: Secondary | ICD-10-CM | POA: Diagnosis not present

## 2017-03-08 DIAGNOSIS — Z79899 Other long term (current) drug therapy: Secondary | ICD-10-CM | POA: Diagnosis not present

## 2017-03-08 DIAGNOSIS — M109 Gout, unspecified: Secondary | ICD-10-CM | POA: Diagnosis not present

## 2017-03-08 DIAGNOSIS — M19012 Primary osteoarthritis, left shoulder: Secondary | ICD-10-CM | POA: Diagnosis not present

## 2017-03-08 LAB — COMPREHENSIVE METABOLIC PANEL
ALK PHOS: 74 U/L (ref 38–126)
ALT: 54 U/L (ref 17–63)
AST: 80 U/L — AB (ref 15–41)
Albumin: 4.1 g/dL (ref 3.5–5.0)
Anion gap: 8 (ref 5–15)
BILIRUBIN TOTAL: 0.6 mg/dL (ref 0.3–1.2)
BUN: 15 mg/dL (ref 6–20)
CALCIUM: 9.5 mg/dL (ref 8.9–10.3)
CO2: 26 mmol/L (ref 22–32)
CREATININE: 1.39 mg/dL — AB (ref 0.61–1.24)
Chloride: 102 mmol/L (ref 101–111)
GFR calc Af Amer: >60 mL/min (ref >60)
GFR, EST NON AFRICAN AMERICAN: 54 mL/min — AB (ref >60)
Glucose, Bld: 111 mg/dL — ABNORMAL HIGH (ref 65–99)
POTASSIUM: 3.6 mmol/L (ref 3.5–5.1)
Sodium: 136 mmol/L (ref 135–145)
TOTAL PROTEIN: 8.3 g/dL — AB (ref 6.5–8.1)

## 2017-03-08 LAB — CBC WITH DIFFERENTIAL/PLATELET
BASOS ABS: 0 10*3/uL (ref 0.0–0.1)
BASOS PCT: 1 %
EOS ABS: 0.2 10*3/uL (ref 0.0–0.7)
EOS PCT: 5 %
HCT: 32.6 % — ABNORMAL LOW (ref 39.0–52.0)
Hemoglobin: 10.7 g/dL — ABNORMAL LOW (ref 13.0–17.0)
LYMPHS PCT: 38 %
Lymphs Abs: 1.7 10*3/uL (ref 0.7–4.0)
MCH: 30.4 pg (ref 26.0–34.0)
MCHC: 32.8 g/dL (ref 30.0–36.0)
MCV: 92.6 fL (ref 78.0–100.0)
MONO ABS: 0.2 10*3/uL (ref 0.1–1.0)
Monocytes Relative: 5 %
Neutro Abs: 2.4 10*3/uL (ref 1.7–7.7)
Neutrophils Relative %: 53 %
PLATELETS: 150 10*3/uL (ref 150–400)
RBC: 3.52 MIL/uL — AB (ref 4.22–5.81)
RDW: 15.4 % (ref 11.5–15.5)
WBC: 4.5 10*3/uL (ref 4.0–10.5)

## 2017-03-08 MED ORDER — HYDROCODONE-ACETAMINOPHEN 5-325 MG PO TABS
1.0000 | ORAL_TABLET | Freq: Four times a day (QID) | ORAL | 0 refills | Status: DC | PRN
Start: 2017-03-08 — End: 2017-03-24

## 2017-03-08 MED ORDER — HYDROCODONE-ACETAMINOPHEN 5-325 MG PO TABS
1.0000 | ORAL_TABLET | Freq: Once | ORAL | Status: AC
  Administered 2017-03-08: 1 via ORAL
  Filled 2017-03-08: qty 1

## 2017-03-08 NOTE — ED Notes (Signed)
Interpreter stated, the live interpreter present has to leave at 610 and will need another live interpreter. Called [phone] and left a message for them to call us back for an interpreter.

## 2017-03-08 NOTE — ED Notes (Signed)
[employer] called for pt ride home. Pt waiting in lobby for taxi. Interpreter sitting with pt until taxi arrives.

## 2017-03-08 NOTE — ED Notes (Signed)
Interpretor Onalee HuaDavid will be here by 14:15

## 2017-03-08 NOTE — Discharge Instructions (Signed)
X-ray showed evidence of some arthritis in the left shoulder. Take the pain medicine as directed. Follow-up with orthopedics.

## 2017-03-08 NOTE — ED Notes (Signed)
Assessment done using a sign interpreter, [facility] Anabel BeneKelly Marshall

## 2017-03-08 NOTE — ED Triage Notes (Signed)
Pt to ER for left shoulder pain onset one month ago, pain with movement. Reports pain is progressively getting worse and he could not tolerate it any longer. Reports also generalized weakness and "not feeling well." pt is in NAD. A/o, patient is deaf, sign language interpreter present.

## 2017-03-08 NOTE — ED Notes (Signed)
Pt. Requested some food please by his interpreter . Given happy meal and a ginger ale. [facility]. Shoulder pain for one month.

## 2017-03-08 NOTE — ED Notes (Signed)
INTERPRETOR here as requested

## 2017-03-08 NOTE — ED Notes (Signed)
Discharge instructions reviewed with interpreter at bedside.

## 2017-03-08 NOTE — ED Provider Notes (Addendum)
[facility]-EMERGENCY DEPT Provider Note   CSN: [id number] Arrival date & time: 03/08/17  1336  By signing my name below, I, Phillips Climes, attest that this documentation has been prepared under the direction and in the presence of Vanetta Mulders, MD . Electronically Signed: Phillips Climes, Scribe. 03/08/2017. 7:39 PM.  History   Chief Complaint Chief Complaint  Patient presents with  . Shoulder Pain   HPI Comments Martin [name] is a 61 y.o. deaf male with a PMHx significant for gout, Ogilvie's syndrome and chronic constipation, who presents to the Emergency Department with complaints of a worsening, aching left shoulder pain x1 month with associated hand pain.  Sx worsened by movement and have been constant since onset, currently rated a 8/10 in severity.  Pt has not seen a MD for the symptoms prior to today.  No fall or recent injury.  Pt uses crutches to ambulate at baseline 2/2 prior knee surgery.  Pt with chronic constipation and abdominal distention, for which he plans on making an outpatient f/u appointment for.  He denies experiencing any other acute sx and has not attempted any OTC symptomatic management before presenting today for evaluation.  The history is provided by the patient and medical records. No language interpreter was used.    Past Medical History:  Diagnosis Date  . Chronic constipation   . Deaf   . Gout   . Hypothyroidism   . Legally blind   . Ogilvie's syndrome   . Pyogenic arthritis of right knee joint [facility])     Patient Active Problem List   Diagnosis Date Noted  . Protein-calorie malnutrition, severe 12/23/2015  . Septic joint (HCC) 12/21/2015  . Chronic pain syndrome 12/21/2015  . Legally blind 12/21/2015  . Deaf 12/21/2015  . Hypothyroidism 12/21/2015  . Swelling of left hand 12/21/2015  . Constipation     Past Surgical History:  Procedure Laterality Date  . EYE SURGERY Right    "relieved fluid pressure and cleaned it out"  . KNEE ARTHROSCOPY  Right 12/22/2015   Procedure: ARTHROSCOPY WASHOUT OF SEPTIC RIGHT KNEE;  Surgeon: Sheral Apley, MD;  Location: [facility] OR;  Service: Orthopedics;  Laterality: Right;  . KNEE SURGERY Left 2015/2016       Home Medications    Prior to Admission medications   Medication Sig Start Date End Date Taking? Authorizing Provider  bimatoprost (LUMIGAN) 0.01 % SOLN Place 1 drop into both eyes at bedtime.    [provider]  colchicine 0.6 MG tablet Take 1 tablet (0.6 mg total) by mouth daily. 12/25/15   Rhetta Mura, MD  DORZOLAMIDE HCL-TIMOLOL MAL OP Apply 1 drop to eye 2 (two) times daily.    [provider]  doxycycline (VIBRAMYCIN) 100 MG capsule Take 1 capsule (100 mg total) by mouth 2 (two) times daily. 12/25/15   Rhetta Mura, MD  gabapentin (NEURONTIN) 300 MG capsule Take 1 capsule (300 mg total) by mouth 3 (three) times daily. 01/18/16   Pete Glatter, MD  HYDROcodone-acetaminophen (NORCO/VICODIN) 5-325 MG tablet Take 1 tablet by mouth every 6 (six) hours as needed for moderate pain. 12/17/15   Lawyer, Cristal Deer, PA-C  HYDROcodone-acetaminophen (NORCO/VICODIN) 5-325 MG tablet Take 1-2 tablets by mouth every 6 (six) hours as needed for moderate pain. 03/08/17   Vanetta Mulders, MD  levofloxacin (LEVAQUIN) 750 MG tablet Take 1 tablet (750 mg total) by mouth daily. 12/25/15   Rhetta Mura, MD  levothyroxine (SYNTHROID, LEVOTHROID) 125 MCG tablet Take 1 tablet (125 mcg  total) by mouth daily before breakfast. 01/18/16   Pete GlatterLangeland, Dawn T, MD  Multiple Vitamin (MULTIVITAMIN WITH MINERALS) TABS tablet Take 1 tablet by mouth daily.    [provider]  ondansetron (ZOFRAN) 4 MG tablet Take 1 tablet (4 mg total) by mouth every 8 (eight) hours as needed for nausea or vomiting. 01/18/16   Pete GlatterLangeland, Dawn T, MD  oxyCODONE-acetaminophen (PERCOCET/ROXICET) 5-325 MG tablet Take 1 tablet by mouth every 6 (six) hours as needed for severe pain. 12/25/15   Rhetta MuraSamtani,  Jai-Gurmukh, MD  polyethylene glycol (MIRALAX) packet Take 17 g by mouth daily. 01/04/17   Rancour, Jeannett SeniorStephen, MD  senna (SENOKOT) 8.6 MG TABS tablet Take 1 tablet (8.6 mg total) by mouth 2 (two) times daily as needed for mild constipation. 01/18/16   Pete GlatterLangeland, Dawn T, MD    Family History Family History  Problem Relation Age of Onset  . Family history unknown: Yes    Social History Social History  Substance Use Topics  . Smoking status: Never Smoker  . Smokeless tobacco: Never Used  . Alcohol use No     Allergies   Patient has no known allergies.   Review of Systems Review of Systems   Physical Exam Updated Vital Signs BP 110/89   Pulse 60   Temp 97.5 F (36.4 C) (Oral)   Resp 18   SpO2 99%   Physical Exam  Constitutional: No distress.  HENT:  Head: Normocephalic and atraumatic.  Eyes: Conjunctivae and EOM are normal. Pupils are equal, round, and reactive to light. No scleral icterus.  Neck: Normal range of motion.  Cardiovascular: Normal rate, regular rhythm, normal heart sounds and intact distal pulses.  Exam reveals no gallop and no friction rub.   No murmur heard. Pulmonary/Chest: Effort normal and breath sounds normal. No respiratory distress. He has no wheezes. He has no rales.  Left arm radial pulse 2+  Abdominal: Bowel sounds are normal. He exhibits distension.  Musculoskeletal: He exhibits no edema or tenderness.  Left arm radial pulse 2+.  No obvious deformity. No obvious deformity. Not significantly warm to touch. Pt can shrug shoulder.  Able to bend arm at the elbow.  Neurological: He is alert.  Psychiatric: He has a normal mood and affect.   ED Treatments / Results  DIAGNOSTIC STUDIES: Oxygen Saturation is 99% on room air, normal by my interpretation.    COORDINATION OF CARE: 6:38 PM Discussed treatment plan with pt, wife and ASL interpretor at bedside. They agreed to plan.  Labs (all labs ordered are listed, but only abnormal results are  displayed) Labs Reviewed  COMPREHENSIVE METABOLIC PANEL - Abnormal; Notable for the following:       Result Value   Glucose, Bld 111 (*)    Creatinine, Ser 1.39 (*)    Total Protein 8.3 (*)    AST 80 (*)    GFR calc non Af Amer 54 (*)    All other components within normal limits  CBC WITH DIFFERENTIAL/PLATELET - Abnormal; Notable for the following:    RBC 3.52 (*)    Hemoglobin 10.7 (*)    HCT 32.6 (*)    All other components within normal limits   EKG  EKG Interpretation None      Radiology Dg Shoulder Left  Result Date: 03/08/2017 CLINICAL DATA:  Left shoulder pain for 1 month. EXAM: LEFT SHOULDER - 2+ VIEW COMPARISON:  None FINDINGS: There is no evidence of fracture or dislocation. Mild glenohumeral joint osteoarthritis. Soft tissues are unremarkable.  IMPRESSION: 1. No acute findings. 2. Mild glenohumeral joint osteoarthritis. . Electronically Signed   By: Signa Kell M.D.   On: 03/08/2017 15:19   Procedures Procedures (including critical care time)  Medications Ordered in ED Medications  HYDROcodone-acetaminophen (NORCO/VICODIN) 5-325 MG per tablet 1 tablet (1 tablet Oral Given 03/08/17 1933)    Initial Impression / Assessment and Plan / ED Course  I have reviewed the triage vital signs and the nursing notes.  Pertinent labs & imaging results that were available during my care of the patient were reviewed by me and considered in my medical decision making (see chart for details).    Patient with complaint of left shoulder pain. X-rays do show some evidence of osteoarthritis in the shoulder. Patient is required to use crutches for walking. This perhaps could exacerbate the shoulder pain. Will treat with pain medicine and follow-up with orthopedics.    I personally performed the services described in this documentation, which was scribed in my presence. The recorded information has been reviewed and is accurate.    Final Clinical Impressions(s) / ED Diagnoses    Final diagnoses:  Acute pain of left shoulder    New Prescriptions New Prescriptions   HYDROCODONE-ACETAMINOPHEN (NORCO/VICODIN) 5-325 MG TABLET    Take 1-2 tablets by mouth every 6 (six) hours as needed for moderate pain.     Vanetta Mulders, MD 03/08/17 Wynetta Emery    Vanetta Mulders, MD 03/08/17 2005

## 2017-03-21 ENCOUNTER — Emergency Department (HOSPITAL_COMMUNITY): Payer: Medicare Other

## 2017-03-21 ENCOUNTER — Encounter (HOSPITAL_COMMUNITY): Payer: Self-pay | Admitting: Emergency Medicine

## 2017-03-21 ENCOUNTER — Inpatient Hospital Stay (HOSPITAL_COMMUNITY)
Admission: EM | Admit: 2017-03-21 | Discharge: 2017-03-24 | DRG: 070 | Disposition: A | Discharge status: Home / Self Care | Payer: Medicare Other | Attending: Internal Medicine | Admitting: Internal Medicine

## 2017-03-21 DIAGNOSIS — H548 Legal blindness, as defined in USA: Secondary | ICD-10-CM | POA: Diagnosis present

## 2017-03-21 DIAGNOSIS — E43 Unspecified severe protein-calorie malnutrition: Secondary | ICD-10-CM | POA: Diagnosis present

## 2017-03-21 DIAGNOSIS — D61818 Other pancytopenia: Secondary | ICD-10-CM | POA: Diagnosis not present

## 2017-03-21 DIAGNOSIS — R4182 Altered mental status, unspecified: Secondary | ICD-10-CM | POA: Diagnosis not present

## 2017-03-21 DIAGNOSIS — Z681 Body mass index (BMI) 19 or less, adult: Secondary | ICD-10-CM

## 2017-03-21 DIAGNOSIS — M7989 Other specified soft tissue disorders: Secondary | ICD-10-CM | POA: Diagnosis present

## 2017-03-21 DIAGNOSIS — R627 Adult failure to thrive: Secondary | ICD-10-CM | POA: Diagnosis not present

## 2017-03-21 DIAGNOSIS — F4323 Adjustment disorder with mixed anxiety and depressed mood: Secondary | ICD-10-CM | POA: Diagnosis not present

## 2017-03-21 DIAGNOSIS — H9190 Unspecified hearing loss, unspecified ear: Secondary | ICD-10-CM

## 2017-03-21 DIAGNOSIS — R5383 Other fatigue: Secondary | ICD-10-CM | POA: Diagnosis present

## 2017-03-21 DIAGNOSIS — G9340 Encephalopathy, unspecified: Principal | ICD-10-CM

## 2017-03-21 DIAGNOSIS — M25569 Pain in unspecified knee: Secondary | ICD-10-CM | POA: Diagnosis not present

## 2017-03-21 DIAGNOSIS — E039 Hypothyroidism, unspecified: Secondary | ICD-10-CM | POA: Diagnosis present

## 2017-03-21 DIAGNOSIS — G894 Chronic pain syndrome: Secondary | ICD-10-CM | POA: Diagnosis present

## 2017-03-21 DIAGNOSIS — Z79899 Other long term (current) drug therapy: Secondary | ICD-10-CM

## 2017-03-21 DIAGNOSIS — R404 Transient alteration of awareness: Secondary | ICD-10-CM | POA: Diagnosis not present

## 2017-03-21 DIAGNOSIS — K5900 Constipation, unspecified: Secondary | ICD-10-CM | POA: Diagnosis not present

## 2017-03-21 DIAGNOSIS — K5909 Other constipation: Secondary | ICD-10-CM | POA: Diagnosis present

## 2017-03-21 DIAGNOSIS — N182 Chronic kidney disease, stage 2 (mild): Secondary | ICD-10-CM | POA: Diagnosis present

## 2017-03-21 DIAGNOSIS — H9193 Unspecified hearing loss, bilateral: Secondary | ICD-10-CM | POA: Diagnosis not present

## 2017-03-21 DIAGNOSIS — Z9119 Patient's noncompliance with other medical treatment and regimen: Secondary | ICD-10-CM

## 2017-03-21 DIAGNOSIS — M6281 Muscle weakness (generalized): Secondary | ICD-10-CM

## 2017-03-21 DIAGNOSIS — M25512 Pain in left shoulder: Secondary | ICD-10-CM

## 2017-03-21 DIAGNOSIS — R531 Weakness: Secondary | ICD-10-CM | POA: Diagnosis not present

## 2017-03-21 LAB — IRON AND TIBC
Iron: 41 ug/dL — ABNORMAL LOW (ref 45–182)
SATURATION RATIOS: 20 % (ref 17.9–39.5)
TIBC: 206 ug/dL — ABNORMAL LOW (ref 250–450)
UIBC: 165 ug/dL

## 2017-03-21 LAB — RAPID URINE DRUG SCREEN, HOSP PERFORMED
AMPHETAMINES: NOT DETECTED
Barbiturates: NOT DETECTED
Benzodiazepines: NOT DETECTED
Cocaine: NOT DETECTED
OPIATES: NOT DETECTED
Tetrahydrocannabinol: NOT DETECTED

## 2017-03-21 LAB — COMPREHENSIVE METABOLIC PANEL
ALBUMIN: 3.8 g/dL (ref 3.5–5.0)
ALK PHOS: 65 U/L (ref 38–126)
ALT: 57 U/L (ref 17–63)
AST: 94 U/L — AB (ref 15–41)
Anion gap: 7 (ref 5–15)
BUN: 15 mg/dL (ref 6–20)
CALCIUM: 9.3 mg/dL (ref 8.9–10.3)
CO2: 27 mmol/L (ref 22–32)
Chloride: 99 mmol/L — ABNORMAL LOW (ref 101–111)
Creatinine, Ser: 1.44 mg/dL — ABNORMAL HIGH (ref 0.61–1.24)
GFR calc Af Amer: >60 mL/min (ref >60)
GFR calc non Af Amer: 52 mL/min — ABNORMAL LOW (ref >60)
GLUCOSE: 93 mg/dL (ref 65–99)
Potassium: 3.9 mmol/L (ref 3.5–5.1)
Sodium: 133 mmol/L — ABNORMAL LOW (ref 135–145)
TOTAL PROTEIN: 7.7 g/dL (ref 6.5–8.1)
Total Bilirubin: 0.7 mg/dL (ref 0.3–1.2)

## 2017-03-21 LAB — URINALYSIS, ROUTINE W REFLEX MICROSCOPIC
Bilirubin Urine: NEGATIVE
Glucose, UA: NEGATIVE mg/dL
HGB URINE DIPSTICK: NEGATIVE
Ketones, ur: NEGATIVE mg/dL
Leukocytes, UA: NEGATIVE
Nitrite: NEGATIVE
PROTEIN: NEGATIVE mg/dL
Specific Gravity, Urine: 1.009 (ref 1.005–1.030)
pH: 7 (ref 5.0–8.0)

## 2017-03-21 LAB — CBC WITH DIFFERENTIAL/PLATELET
BASOS ABS: 0 10*3/uL (ref 0.0–0.1)
BASOS PCT: 1 %
EOS ABS: 0.2 10*3/uL (ref 0.0–0.7)
Eosinophils Relative: 6 %
HCT: 33.5 % — ABNORMAL LOW (ref 39.0–52.0)
HEMOGLOBIN: 10.8 g/dL — AB (ref 13.0–17.0)
Lymphocytes Relative: 37 %
Lymphs Abs: 1.1 10*3/uL (ref 0.7–4.0)
MCH: 30 pg (ref 26.0–34.0)
MCHC: 32.2 g/dL (ref 30.0–36.0)
MCV: 93.1 fL (ref 78.0–100.0)
Monocytes Absolute: 0.2 10*3/uL (ref 0.1–1.0)
Monocytes Relative: 5 %
NEUTROS PCT: 51 %
Neutro Abs: 1.5 10*3/uL — ABNORMAL LOW (ref 1.7–7.7)
Platelets: 121 10*3/uL — ABNORMAL LOW (ref 150–400)
RBC: 3.6 MIL/uL — AB (ref 4.22–5.81)
RDW: 15.2 % (ref 11.5–15.5)
WBC: 3 10*3/uL — AB (ref 4.0–10.5)

## 2017-03-21 LAB — FERRITIN: FERRITIN: 136 ng/mL (ref 24–336)

## 2017-03-21 LAB — RETICULOCYTES
RBC.: 3.84 MIL/uL — AB (ref 4.22–5.81)
Retic Count, Absolute: 26.9 10*3/uL (ref 19.0–186.0)
Retic Ct Pct: 0.7 % (ref 0.4–3.1)

## 2017-03-21 LAB — TSH: TSH: 42.491 u[IU]/mL — ABNORMAL HIGH (ref 0.350–4.500)

## 2017-03-21 LAB — PREALBUMIN: PREALBUMIN: 8.7 mg/dL — AB (ref 18–38)

## 2017-03-21 LAB — FOLATE: FOLATE: 17.7 ng/mL (ref >5.9)

## 2017-03-21 LAB — AMMONIA: AMMONIA: 12 umol/L (ref 9–35)

## 2017-03-21 LAB — VITAMIN B12: Vitamin B-12: 599 pg/mL (ref 180–914)

## 2017-03-21 MED ORDER — ONDANSETRON HCL 4 MG/2ML IJ SOLN
4.0000 mg | Freq: Four times a day (QID) | INTRAMUSCULAR | Status: DC | PRN
  Administered 2017-03-23: 4 mg via INTRAVENOUS
  Filled 2017-03-21 (×2): qty 2

## 2017-03-21 MED ORDER — ONDANSETRON HCL 4 MG PO TABS: 4.0000 mg | ORAL_TABLET | Freq: Four times a day (QID) | ORAL | Status: DC | PRN

## 2017-03-21 MED ORDER — COLCHICINE 0.6 MG PO TABS
0.6000 mg | ORAL_TABLET | Freq: Every day | ORAL | Status: DC
  Administered 2017-03-22 – 2017-03-24 (×3): 0.6 mg via ORAL
  Filled 2017-03-21 (×3): qty 1

## 2017-03-21 MED ORDER — ACETAMINOPHEN 650 MG RE SUPP: 650.0000 mg | Freq: Four times a day (QID) | RECTAL | Status: DC | PRN

## 2017-03-21 MED ORDER — SODIUM CHLORIDE 0.9 % IV SOLN
INTRAVENOUS | Status: DC
  Administered 2017-03-21 – 2017-03-23 (×3): via INTRAVENOUS

## 2017-03-21 MED ORDER — FENTANYL CITRATE (PF) 100 MCG/2ML IJ SOLN
25.0000 ug | Freq: Once | INTRAMUSCULAR | Status: DC
  Filled 2017-03-21: qty 2

## 2017-03-21 MED ORDER — LATANOPROST 0.005 % OP SOLN
1.0000 [drp] | Freq: Every day | OPHTHALMIC | Status: DC
  Administered 2017-03-21 – 2017-03-23 (×3): 1 [drp] via OPHTHALMIC
  Filled 2017-03-21: qty 2.5

## 2017-03-21 MED ORDER — HEPARIN SODIUM (PORCINE) 5000 UNIT/ML IJ SOLN
5000.0000 [IU] | Freq: Three times a day (TID) | INTRAMUSCULAR | Status: DC
Start: 2017-03-21 — End: 2017-03-22

## 2017-03-21 MED ORDER — DORZOLAMIDE HCL-TIMOLOL MAL 2-0.5 % OP SOLN
1.0000 [drp] | Freq: Two times a day (BID) | OPHTHALMIC | Status: DC
  Administered 2017-03-21 – 2017-03-24 (×6): 1 [drp] via OPHTHALMIC
  Filled 2017-03-21: qty 10

## 2017-03-21 MED ORDER — ACETAMINOPHEN 325 MG PO TABS
650.0000 mg | ORAL_TABLET | Freq: Four times a day (QID) | ORAL | Status: DC | PRN
  Filled 2017-03-21 (×2): qty 2

## 2017-03-21 MED ORDER — BISACODYL 10 MG RE SUPP: 10.0000 mg | Freq: Every day | RECTAL | Status: DC | PRN

## 2017-03-21 MED ORDER — SODIUM CHLORIDE 0.9 % IV BOLUS (SEPSIS)
1000.0000 mL | Freq: Once | INTRAVENOUS | Status: AC
  Administered 2017-03-21: 1000 mL via INTRAVENOUS

## 2017-03-21 MED ORDER — SENNOSIDES-DOCUSATE SODIUM 8.6-50 MG PO TABS: 1.0000 | ORAL_TABLET | Freq: Every evening | ORAL | Status: DC | PRN

## 2017-03-21 NOTE — Care Management (Signed)
ED CM spoke with Martin [name] on [place] regarding patient needing Martin interpreting [name]. [facility] with decrease level of responsiveness. Patient was brought to the ED by his Martin Service Advocate Specialist Martin [name] [phone] or Martin [name] [phone]. Patient is visually impaired and hearing impaired as well. Patient is being brought into the hospital for observation, patient will need assistance while inpatient  communication [name]. Interpreting [name] has been contacted and will have a Martin service interpreter with patient tonight and starting again at 7am. CM updated Biochemist, clinicalBrooke [name]. Patient will be on 5W Nursing Unit, SW Consult has been placed. Patient and wife has been evicted from their apartment, and been working with Martin [name].

## 2017-03-21 NOTE — Clinical Social Work Note (Signed)
Clinical Social Work Assessment  Patient Details  Name: Martin [name] MRN: [id number] Date of Birth: 01-27-1956  Date of referral:  03/21/17               Reason for consult:  Family Concerns, Facility Placement, [employer], Discharge Planning, Housing Concerns/Homelessness, Capacity                Permission sought to share information with:  [employer], Other (Martin [name] [phone] / Martin [name] [phone] - Division of Services for the Deaf and Hard of Hearing) Permission granted to share information::  Yes, Verbal Permission Granted  Name::     Martin [name]  Agency::     Relationship::  Spouse  Contact Information:  Deaf - communicate with facility [name] to address patient concerns with patient wife  Housing/Transportation Living arrangements for the past 2 months:  Apartment Source of Information:  Spouse, Other (Comment Required) (Deaf Services Specialist - Martin [name]) Patient Interpreter Needed:  Sign Language Criminal Activity/Legal Involvement Pertinent to Current Situation/Hospitalization:  No - Comment as needed Significant Relationships:  Spouse, Community Support Lives with:  Spouse Do you feel safe going back to the place where you live?  No (Patient and spouse evicted and have 30 days from today to move) Need for family participation in patient care:  Yes (Comment)  Care giving concerns:  Patient spouse is providing personal care services to patient at home.  Patient and spouse have been evicted as of today and will need to move in 30 days.  Patient spouse with possible developmental delays, however is able to sign consents and is agreeable with CSW involvement.   Social Worker assessment / plan:  Martin [name] met with patient and patient spouse at bedside to offer support and discuss patient needs at discharge.  Patient is legally deaf and blind and patient spouse is deaf.  Patient and spouse with three interpreters present from  the Division of Services for the Deaf and Hard of Hearing to assist with communication and discharge planning.  Patient is currently not responding or communicating any of his needs.  Per Martin [name], Deaf Services Specialist, patient is usually very interactive and willing to communicate.  Patient and spouse received [employer] housing from the agency, however have violated the apartment rules during times of inspection and have received eviction notice for 30 days from today.    Patient spouse is agreeable to assistance and is willing to have a referral for Assisted Living placement be initiated.  Patient is now being admitted to the floor for further work up and therapies will likely evaluate to determine most appropriate level of care at discharge.  Deaf Services Specialist, Martin [name] is committed to working with patient and spouse for follow up needs and continued support for daily communication.  CSW will update unit CSW on patient needs and possible plan of care moving forward.  Employment status:  Disabled (Comment on whether or not currently receiving Disability) Insurance information:  Medicare, Medicaid [place] PT Recommendations:  Not assessed at this time Information / Referral to community resources:  APS (Comment Required: Martin [name], Name & Number of worker spoken with), Other (Comment Required) (Possible need for APS referral pending disposition plan / Martin [name])  Patient/Family's Response to care:  Patient is currently not responding, unsure if by choice or due to medical reasoning.  Patient spouse is agreeable with disposition plans including placement if needed for both patient spouse.  Patient spouse does not seem  to grasp the understanding of CSW role, however with support of Deaf Services Specialist, patient spouse is able to communicate needs.  Patient/Family's Understanding of and Emotional Response to Diagnosis, Current Treatment, and Prognosis:  Patient spouse  and Deaf Services Specialist, Martin [name] are understanding of patient medical decline within the last several months, but do not have reasoning.  Patient spouse with limitations, as she is also deaf but communicates through interpreter.  Emotional Assessment Appearance:  Appears older than stated age, Disheveled Attitude/Demeanor/Rapport:  Unable to Assess (Patient non responsive with interpreter at this time) Affect (typically observed):  Unable to Assess Orientation:  Oriented to Self Alcohol / Substance use:  Not Applicable Psych involvement (Current and /or in the community):  No (Comment)  Discharge Needs  Concerns to be addressed:  Coping/Stress Concerns, Discharge Planning Concerns, Lack of Support, Home Safety Concerns, Basic Needs, Care Coordination, Homelessness, Financial / Insurance Concerns Readmission within the last 30 days:  No Current discharge risk:  Physical Impairment, Lack of support system, Homeless, Other (ability to communicate with interpreter services present) Barriers to Discharge:  Continued Medical Work up  [employer], [place]

## 2017-03-21 NOTE — ED Triage Notes (Signed)
Per EMS: pt from home c/o left shoulder pain and weakness that is not new; pt in non ambulatory and blind and deaf; unsure of how pt normally communicates

## 2017-03-21 NOTE — H&P (Signed)
History and Physical    Martin [name] OHY:[id number] DOB: 05-Jan-1956 DOA: 03/21/2017   PCP: [name]   Patient coming from:  Home    Chief Complaint: Decreased level of responsiveness  HPI: history is obtained by interpreter, and wife, as well as social work interpreter Martin [name] is a 61 y.o. male with medical history significant for Ogilvie's syndrome, Gout, History of septic arthritis of the right knee, legally biind, deaf, who has not been seen by a PCP over the last year, has not been taking regularly his medications since, who presented to the ED due to gradual decline of his overall health status, with decreased level of responsiveness. He had been seen at the ED with L shoulder pain, hand pain and right knee pain with a negative workup discharged on the medications, which he did not use. Over the last few days, the patient has in weaker, not interactive, and not following, and. No confusion is reported per interpreter. Denies fevers, chills, night sweats, respiratory complaints. Denies any chest pain or palpitations. Denies lower extremity swelling. Denies nausea, heartburn or change in bowel habits. He has some abdominal pain and distention, but this is chronic  Denies any dysuria, although he has decreased urine output due to poor oral intake . Denies abnormal skin rashes, or neuropathy. Denies any bleeding issues such as epistaxis, hematemesis, hematuria or hematochezia. He is not ambulating , essentially bedbound.  Of note, patient and wife have been evicted today from their apartment and this is being ,anaged by social work, but is adding stress to the patient   ED Course:  BP 122/75   Pulse (!) 47   Temp 97.6 F (36.4 C) (Axillary)   Resp 11   SpO2 100%    sodium 133 potassium 3.9 bicarb 2731.44 bilirubin 0.7 white count 3 hemoglobin 10.8 platelets 121 urine is negative UDS negative CT of the head without acute findings chest x-ray is negative for acute  findings abdominal x-ray negative for small bowel obstruction, chronic elevation of the left hemidiaphragm  Review of Systems:  As per HPI otherwise all other systems reviewed and are negative  Past Medical History:  Diagnosis Date  . Chronic constipation   . Deaf   . Gout   . Hypothyroidism   . Legally blind   . Ogilvie's syndrome   . Pyogenic arthritis of right knee joint [facility])     Past Surgical History:  Procedure Laterality Date  . EYE SURGERY Right    "relieved fluid pressure and cleaned it out"  . KNEE ARTHROSCOPY Right 12/22/2015   Procedure: ARTHROSCOPY WASHOUT OF SEPTIC RIGHT KNEE;  Surgeon: Sheral Apley, MD;  Location[facility] OR;  Service: Orthopedics;  Laterality: Right;  . KNEE SURGERY Left 2015/2016    Social History Social History   Social History  . Marital status: Married    Spouse name: N/A  . Number of children: N/A  . Years of education: N/A   Occupational History  . Not on file.   Social History Main Topics  . Smoking status: Never Smoker  . Smokeless tobacco: Never Used  . Alcohol use No  . Drug use: No  . Sexual activity: Yes   Other Topics Concern  . Not on file   Social History Narrative  . No narrative on file     No Known Allergies  Family History  Problem Relation Age of Onset  . Family history unknown: Yes      Prior to Admission medications  Medication Sig Start Date End Date Taking? Authorizing Provider  bimatoprost (LUMIGAN) 0.01 % SOLN Place 1 drop into both eyes at bedtime.    [provider]  colchicine 0.6 MG tablet Take 1 tablet (0.6 mg total) by mouth daily. 12/25/15   Rhetta MuraSamtani, Jai-Gurmukh, MD  DORZOLAMIDE HCL-TIMOLOL MAL OP Apply 1 drop to eye 2 (two) times daily.    [provider]  doxycycline (VIBRAMYCIN) 100 MG capsule Take 1 capsule (100 mg total) by mouth 2 (two) times daily. 12/25/15   Rhetta MuraSamtani, Jai-Gurmukh, MD  gabapentin (NEURONTIN) 300 MG capsule Take 1 capsule (300 mg total) by mouth 3  (three) times daily. 01/18/16   Pete GlatterLangeland, Dawn T, MD  HYDROcodone-acetaminophen (NORCO/VICODIN) 5-325 MG tablet Take 1 tablet by mouth every 6 (six) hours as needed for moderate pain. 12/17/15   Lawyer, Cristal Deerhristopher, PA-C  HYDROcodone-acetaminophen (NORCO/VICODIN) 5-325 MG tablet Take 1-2 tablets by mouth every 6 (six) hours as needed for moderate pain. 03/08/17   Vanetta MuldersZackowski, Scott, MD  levofloxacin (LEVAQUIN) 750 MG tablet Take 1 tablet (750 mg total) by mouth daily. 12/25/15   Rhetta MuraSamtani, Jai-Gurmukh, MD  levothyroxine (SYNTHROID, LEVOTHROID) 125 MCG tablet Take 1 tablet (125 mcg total) by mouth daily before breakfast. 01/18/16   Pete GlatterLangeland, Dawn T, MD  Multiple Vitamin (MULTIVITAMIN WITH MINERALS) TABS tablet Take 1 tablet by mouth daily.    [provider]  ondansetron (ZOFRAN) 4 MG tablet Take 1 tablet (4 mg total) by mouth every 8 (eight) hours as needed for nausea or vomiting. 01/18/16   Pete GlatterLangeland, Dawn T, MD  oxyCODONE-acetaminophen (PERCOCET/ROXICET) 5-325 MG tablet Take 1 tablet by mouth every 6 (six) hours as needed for severe pain. 12/25/15   Rhetta MuraSamtani, Jai-Gurmukh, MD  polyethylene glycol (MIRALAX) packet Take 17 g by mouth daily. 01/04/17   Rancour, Jeannett SeniorStephen, MD  senna (SENOKOT) 8.6 MG TABS tablet Take 1 tablet (8.6 mg total) by mouth 2 (two) times daily as needed for mild constipation. 01/18/16   Pete GlatterLangeland, Dawn T, MD    Physical Exam:  Vitals:   03/21/17 1530 03/21/17 1545 03/21/17 1615 03/21/17 1630  BP: 121/74  113/75 122/75  Pulse: (!) 48 (!) 56 (!) 47 (!) 47  Resp:  15 12 11   Temp:      TempSrc:      SpO2: 100% 100% 100% 100%   Constitutional: NAD,patient understands questions by interpreter, but not answering them, or following commands  Eyes: Legally blind. Wears glasses  ENMT: Mucous membranes are moist, without exudate or lesions  Neck: normal, supple, no masses, no thyromegaly Respiratory: clear to auscultation bilaterally, no wheezing, no crackles. Normal respiratory  effort  Cardiovascular: Regular rate and rhythm, no murmurs, rubs or gallops. No extremity edema. 2+ pedal pulses. No carotid bruits.  Abdomen: Chronically distended and tender,  Bowel sounds positive.  Musculoskeletal: no clubbing / cyanosis. Moves all extremities. Left hand with chronic swelling  Skin: no jaundice, No lesions.  Neurologic: Sensation intact  Strength equal in all extremities. Legally blind and deaf  Psychiatric:   Alert and oriented x 3. Depressed mood, flat affect      Labs on Admission: I have personally reviewed following labs and imaging studies  CBC:  Recent Labs Lab 03/21/17 1144  WBC 3.0*  NEUTROABS 1.5*  HGB 10.8*  HCT 33.5*  MCV 93.1  PLT 121*    Basic Metabolic Panel:  Recent Labs Lab 03/21/17 1144  NA 133*  K 3.9  CL 99*  CO2 27  GLUCOSE 93  BUN  15  CREATININE 1.44*  CALCIUM 9.3    GFR: CrCl cannot be calculated (Unknown ideal weight.).  Liver Function Tests:  Recent Labs Lab 03/21/17 1144  AST 94*  ALT 57  ALKPHOS 65  BILITOT 0.7  PROT 7.7  ALBUMIN 3.8   No results for input(s): LIPASE, AMYLASE in the last 168 hours.  Recent Labs Lab 03/21/17 1144  AMMONIA 12    Coagulation Profile: No results for input(s): INR, PROTIME in the last 168 hours.  Cardiac Enzymes: No results for input(s): CKTOTAL, CKMB, CKMBINDEX, TROPONINI in the last 168 hours.  BNP (last 3 results) No results for input(s): PROBNP in the last 8760 hours.  HbA1C: No results for input(s): HGBA1C in the last 72 hours.  CBG: No results for input(s): GLUCAP in the last 168 hours.  Lipid Profile: No results for input(s): CHOL, HDL, LDLCALC, TRIG, CHOLHDL, LDLDIRECT in the last 72 hours.  Thyroid Function Tests: No results for input(s): TSH, T4TOTAL, FREET4, T3FREE, THYROIDAB in the last 72 hours.  Anemia Panel: No results for input(s): VITAMINB12, FOLATE, FERRITIN, TIBC, IRON, RETICCTPCT in the last 72 hours.  Urine analysis:    Component  Value Date/Time   COLORURINE YELLOW 03/21/2017 1422   APPEARANCEUR CLEAR 03/21/2017 1422   LABSPEC 1.009 03/21/2017 1422   PHURINE 7.0 03/21/2017 1422   GLUCOSEU NEGATIVE 03/21/2017 1422   HGBUR NEGATIVE 03/21/2017 1422   BILIRUBINUR NEGATIVE 03/21/2017 1422   KETONESUR NEGATIVE 03/21/2017 1422   PROTEINUR NEGATIVE 03/21/2017 1422   NITRITE NEGATIVE 03/21/2017 1422   LEUKOCYTESUR NEGATIVE 03/21/2017 1422    Sepsis Labs: @LABRCNTIP (procalcitonin:4,lacticidven:4) )No results found for this or any previous visit (from the past 240 hour(s)).   Radiological Exams on Admission: Ct Head Wo Contrast  Result Date: 03/21/2017 CLINICAL DATA:  Increasing lethargy and decreased level of consciousness over the past few days. EXAM: CT HEAD WITHOUT CONTRAST TECHNIQUE: Contiguous axial images were obtained from the base of the skull through the vertex without intravenous contrast. COMPARISON:  None. FINDINGS: Brain: Appears normal without hemorrhage, infarct, mass lesion, mass effect, midline shift or abnormal extra-axial fluid collection. No hydrocephalus or pneumocephalus. Vascular: Mild atherosclerosis noted. Skull: Negative. Sinuses/Orbits: Negative. Other: Degenerative disease about the TMJs is seen bilaterally. IMPRESSION: No acute intracranial abnormality. Degenerative disease about the TMJs bilaterally. Electronically Signed   By: Drusilla Kanner M.D.   On: 03/21/2017 12:52   Dg Abd Acute W/chest  Result Date: 03/21/2017 CLINICAL DATA:  Ogilvie's syndrome.  Altered mental status. EXAM: DG ABDOMEN ACUTE W/ 1V CHEST COMPARISON:  01/04/2017 CT abdomen/ pelvis and abdominal radiograph. FINDINGS: Low lung volumes. Top-normal heart size. Otherwise normal mediastinal contour. No pneumothorax. No pleural effusion. Stable chronic elevation of the left hemidiaphragm. No overt pulmonary edema. No acute consolidative airspace disease. Marked diffuse gaseous distention of the large bowel with moderate colonic  stool volume, a chronic finding, not appreciably changed since 01/04/2017. No disproportionately dilated small bowel loops or air-fluid levels. No evidence of pneumatosis or pneumoperitoneum. Mild thoracolumbar spondylosis. IMPRESSION: 1. No evidence of small-bowel obstruction . 2. Chronic marked diffuse gaseous distention of the large bowel, compatible with Ogilvie's Syndrome . 3. Associated chronic elevation of left hemidiaphragm. Low lung volumes. No acute cardiopulmonary disease. Electronically Signed   By: Delbert Phenix M.D.   On: 03/21/2017 12:42    EKG: Independently reviewed.  Assessment/Plan Active Problems:   Chronic pain syndrome   Legally blind   Deaf   Hypothyroidism   Swelling of left hand   Constipation  Protein-calorie malnutrition, severe   Lethargy    Generalized weakness with decreased level of responsiveness, with negative workup, r/o other organic causes versus dehydration from poor oral intake, versus depression . No signs of infection . No confusion is noted. Afebrile WBC 3.3   Has not been taking his meds over the last 2 months . Received 1 L IVF at the ED Check Orthostatics IVF  CMET in am  PT/OT  NPO until able to tolerate po, will need dietitian consult due to obvious malnourishment and FTT Consider antidepressant  Patient will need Psych evaluation in am   Anemia Hemoglobin on admission 10.o, no bleeding issues, likely due to poor nutritional status  Repeat CBC in am  CHeck anemia panel  No transfusion is indicated at this time  Gout Patient has not been taking his meds Check uric acid, will consider restarting med while in here in view of chronic L hand swelling   Hypothyroidism: Was not on  Synthroid for the last few months  Check TSH  Severe protein calorie malnutrition  Nutrition consult Check PAB   Ogilvie's syndrome,chronic distention and constipation. Abd XR neg for acute findings  Supportive laxatives    DVT prophylaxis:  Heparin    Code Status:   Full      Family Communication:  Discussed with patient Disposition Plan: Expect patient to be discharged to home after condition improves. Needs to ree stablish PCP as OP, Care manager is involved  Consults called:    None  Admission status:Tele  Obs    Kyeshia Zinn E, PA-C [facility]   03/21/2017, 5:00 PM

## 2017-03-21 NOTE — ED Notes (Signed)
Patient transported to X-ray 

## 2017-03-21 NOTE — Progress Notes (Signed)
Darletta MollLarry [name] is a 61 y.o. male patient admitted from ED awake, alert - oriented  X 4 - no acute distress noted.  VSS - Blood pressure 125/63, pulse (!) 52, temperature 97.6 F (36.4 C), temperature source Axillary, resp. rate 15, weight 50.1 kg (110 lb 6.4 oz), SpO2 98 %.    IV in place, occlusive dsg intact without redness.  Orientation to room, and floor completed with information packet given to patient/family.  Patient declined safety video at this time.  Admission INP armband ID verified with patient/family, and in place.   SR up x 2, fall assessment complete, with family able to verbalize understanding of risk associated with falls, and understanding to call nsg before up out of bed.  Call light within reach.   Patient is legally deaf and blind. Interpreter used sign language to communicate with patient and his wife. Often the patient would not respond. Patient having severe pain to touch on left side extremities provider notified and gave orders.     Will cont to eval and treat per MD orders.  Casper HarrisonSamantha K Izola Teague, RN 03/21/2017 6:14 PM

## 2017-03-21 NOTE — ED Notes (Signed)
Patient appears to be more alert and interactive, signing back and forth to his wife at the bedside. Patient responding via ASL with interpreter and wife.

## 2017-03-21 NOTE — ED Notes (Signed)
[facility] paged to request interpreter for patient after 4:30pm. Awaiting call back.

## 2017-03-21 NOTE — Progress Notes (Signed)
Shift assessment completed, sign language interpretor used due to pt being deaf and blind. No c/o pain, but grimacing with movement of left leg and arm. Pt wife at the bedside. Pt refused heparin scheduled for 2200, stated he has been previously told that he should never get heparin shot. RN will inform day shift, so issue can be discussed with provider. Pt and wife verbalized understanding of calling for assistance and using call bell. Will continue to monitor and treat pt per MD orders.

## 2017-03-21 NOTE — Progress Notes (Signed)
Patient will not anwer if he wants the pain medication or nausea medication and wife will not give permission to give

## 2017-03-21 NOTE — ED Notes (Signed)
Called in by family pt shaking head back and forth; pt responding to painful stimuli per norm for this visit upon arrival and VS WNL

## 2017-03-21 NOTE — ED Provider Notes (Signed)
[facility]-EMERGENCY DEPT Provider Note   CSN: [id number] Arrival date & time: 03/21/17  1029     History   Chief Complaint Chief Complaint  Patient presents with  . Shoulder Pain    HPI Martin [name] is a 61 y.o. male who presents with AMS. PMH significant for gout, Ogilvie's syndrome, chronic constipation, chronic pain. Pt is legally blind and deaf. He can speak sign language. History is obtained through a Deaf Services Specialist Trula Ore(Christina) who speaks sign language and has been working with the patient and patient's wife since February. She states that since she has been working with him, he has had a gradual decline in health. He does not get out of bed much due to pain and has had decreased appetite. He is primarily cared for by his wife who helps him with ADLs but she is also deaf and has a poor understanding of his medical problems. She states that she last visited on Thursday and at that time he was alert and talkative. When she asks his wife when he became less responsive she is unsure but thinks it may have been over the weekend. This morning when she visited, the patient was not alert but responsive to pain in the L shoulder and L knee. Of note, he was seen in the ED several weeks ago for L shoulder pain. Xray was remarkable for arthritis and he was discharged with pain medicine.   LEVEL 5 CAVEAT due to patient's AMS and being non-verbal  HPI  Past Medical History:  Diagnosis Date  . Chronic constipation   . Deaf   . Gout   . Hypothyroidism   . Legally blind   . Ogilvie's syndrome   . Pyogenic arthritis of right knee joint [facility](HCC)     Patient Active Problem List   Diagnosis Date Noted  . Protein-calorie malnutrition, severe 12/23/2015  . Septic joint (HCC) 12/21/2015  . Chronic pain syndrome 12/21/2015  . Legally blind 12/21/2015  . Deaf 12/21/2015  . Hypothyroidism 12/21/2015  . Swelling of left hand 12/21/2015  . Constipation     Past Surgical History:  Procedure  Laterality Date  . EYE SURGERY Right    "relieved fluid pressure and cleaned it out"  . KNEE ARTHROSCOPY Right 12/22/2015   Procedure: ARTHROSCOPY WASHOUT OF SEPTIC RIGHT KNEE;  Surgeon: Sheral Apleyimothy D Murphy, MD;  [facility];  Service: Orthopedics;  Laterality: Right;  . KNEE SURGERY Left 2015/2016       Home Medications    Prior to Admission medications   Medication Sig Start Date End Date Taking? Authorizing Provider  bimatoprost (LUMIGAN) 0.01 % SOLN Place 1 drop into both eyes at bedtime.    [provider]  colchicine 0.6 MG tablet Take 1 tablet (0.6 mg total) by mouth daily. 12/25/15   Rhetta MuraSamtani, Jai-Gurmukh, MD  DORZOLAMIDE HCL-TIMOLOL MAL OP Apply 1 drop to eye 2 (two) times daily.    [provider]  doxycycline (VIBRAMYCIN) 100 MG capsule Take 1 capsule (100 mg total) by mouth 2 (two) times daily. 12/25/15   Rhetta MuraSamtani, Jai-Gurmukh, MD  gabapentin (NEURONTIN) 300 MG capsule Take 1 capsule (300 mg total) by mouth 3 (three) times daily. 01/18/16   Pete GlatterLangeland, Dawn T, MD  HYDROcodone-acetaminophen (NORCO/VICODIN) 5-325 MG tablet Take 1 tablet by mouth every 6 (six) hours as needed for moderate pain. 12/17/15   Lawyer, Cristal Deerhristopher, PA-C  HYDROcodone-acetaminophen (NORCO/VICODIN) 5-325 MG tablet Take 1-2 tablets by mouth every 6 (six) hours as needed for moderate pain. 03/08/17  Vanetta Mulders, MD  levofloxacin (LEVAQUIN) 750 MG tablet Take 1 tablet (750 mg total) by mouth daily. 12/25/15   Rhetta Mura, MD  levothyroxine (SYNTHROID, LEVOTHROID) 125 MCG tablet Take 1 tablet (125 mcg total) by mouth daily before breakfast. 01/18/16   Pete Glatter, MD  Multiple Vitamin (MULTIVITAMIN WITH MINERALS) TABS tablet Take 1 tablet by mouth daily.    [provider]  ondansetron (ZOFRAN) 4 MG tablet Take 1 tablet (4 mg total) by mouth every 8 (eight) hours as needed for nausea or vomiting. 01/18/16   Pete Glatter, MD  oxyCODONE-acetaminophen (PERCOCET/ROXICET)  5-325 MG tablet Take 1 tablet by mouth every 6 (six) hours as needed for severe pain. 12/25/15   Rhetta Mura, MD  polyethylene glycol (MIRALAX) packet Take 17 g by mouth daily. 01/04/17   Rancour, Jeannett Senior, MD  senna (SENOKOT) 8.6 MG TABS tablet Take 1 tablet (8.6 mg total) by mouth 2 (two) times daily as needed for mild constipation. 01/18/16   Pete Glatter, MD    Family History Family History  Problem Relation Age of Onset  . Family history unknown: Yes    Social History Social History  Substance Use Topics  . Smoking status: Never Smoker  . Smokeless tobacco: Never Used  . Alcohol use No     Allergies   Patient has no known allergies.   Review of Systems Review of Systems  Unable to perform ROS: Mental status change     Physical Exam Updated Vital Signs BP 113/82 (BP Location: Right Arm)   Pulse (!) 52   Temp 97.6 F (36.4 C) (Axillary)   Resp 16   SpO2 98%   Physical Exam  Constitutional: Vital signs are normal. He appears listless. He appears cachectic. No distress.  HENT:  Head: Normocephalic and atraumatic.  Eyes: Conjunctivae are normal. Pupils are equal, round, and reactive to light. Right eye exhibits no discharge. Left eye exhibits no discharge. No scleral icterus.  Mild periorbital swelling of L upper eyelid  Neck: Normal range of motion.  Cardiovascular: Regular rhythm.  Bradycardia present.  Exam reveals no gallop and no friction rub.   No murmur heard. Pulmonary/Chest: Effort normal and breath sounds normal. No respiratory distress. He has no wheezes. He has no rales. He exhibits no tenderness.  Abdominal: Soft. Bowel sounds are normal. He exhibits distension (mild). He exhibits no mass. There is no tenderness. There is no rebound and no guarding. No hernia.  Previous surgical scar noted  Musculoskeletal:  Moans in response to palpation of L shoulder and knee  Neurological: He appears listless. GCS eye subscore is 4. GCS verbal subscore is  1. GCS motor subscore is 5.  Skin: Skin is warm and dry.  Psychiatric: He is noncommunicative.  Nursing note and vitals reviewed.    ED Treatments / Results  Labs (all labs ordered are listed, but only abnormal results are displayed) Labs Reviewed  CBC WITH DIFFERENTIAL/PLATELET - Abnormal; Notable for the following:       Result Value   WBC 3.0 (*)    RBC 3.60 (*)    Hemoglobin 10.8 (*)    HCT 33.5 (*)    Platelets 121 (*)    Neutro Abs 1.5 (*)    All other components within normal limits  COMPREHENSIVE METABOLIC PANEL - Abnormal; Notable for the following:    Sodium 133 (*)    Chloride 99 (*)    Creatinine, Ser 1.44 (*)    AST 94 (*)  GFR calc non Af Amer 52 (*)    All other components within normal limits  AMMONIA  URINALYSIS, ROUTINE W REFLEX MICROSCOPIC  RAPID URINE DRUG SCREEN, HOSP PERFORMED    EKG  EKG Interpretation None       Radiology Ct Head Wo Contrast  Result Date: 03/21/2017 CLINICAL DATA:  Increasing lethargy and decreased level of consciousness over the past few days. EXAM: CT HEAD WITHOUT CONTRAST TECHNIQUE: Contiguous axial images were obtained from the base of the skull through the vertex without intravenous contrast. COMPARISON:  None. FINDINGS: Brain: Appears normal without hemorrhage, infarct, mass lesion, mass effect, midline shift or abnormal extra-axial fluid collection. No hydrocephalus or pneumocephalus. Vascular: Mild atherosclerosis noted. Skull: Negative. Sinuses/Orbits: Negative. Other: Degenerative disease about the TMJs is seen bilaterally. IMPRESSION: No acute intracranial abnormality. Degenerative disease about the TMJs bilaterally. Electronically Signed   By: Drusilla Kanner M.D.   On: 03/21/2017 12:52   Dg Abd Acute W/chest  Result Date: 03/21/2017 CLINICAL DATA:  Ogilvie's syndrome.  Altered mental status. EXAM: DG ABDOMEN ACUTE W/ 1V CHEST COMPARISON:  01/04/2017 CT abdomen/ pelvis and abdominal radiograph. FINDINGS: Low lung  volumes. Top-normal heart size. Otherwise normal mediastinal contour. No pneumothorax. No pleural effusion. Stable chronic elevation of the left hemidiaphragm. No overt pulmonary edema. No acute consolidative airspace disease. Marked diffuse gaseous distention of the large bowel with moderate colonic stool volume, a chronic finding, not appreciably changed since 01/04/2017. No disproportionately dilated small bowel loops or air-fluid levels. No evidence of pneumatosis or pneumoperitoneum. Mild thoracolumbar spondylosis. IMPRESSION: 1. No evidence of small-bowel obstruction . 2. Chronic marked diffuse gaseous distention of the large bowel, compatible with Ogilvie's Syndrome . 3. Associated chronic elevation of left hemidiaphragm. Low lung volumes. No acute cardiopulmonary disease. Electronically Signed   By: Delbert Phenix M.D.   On: 03/21/2017 12:42    Procedures Procedures (including critical care time)  Medications Ordered in ED Medications  sodium chloride 0.9 % bolus 1,000 mL (not administered)     Initial Impression / Assessment and Plan / ED Course  I have reviewed the triage vital signs and the nursing notes.  Pertinent labs & imaging results that were available during my care of the patient were reviewed by me and considered in my medical decision making (see chart for details).  61 year old male with acute AMS. Unclear etiology at this time. History is very difficult to obtain. Vitals remarkable for bradycardia. CBC remarkable for mild leukopenia and anemia. CMP remarkable for mild hyponatremia and hypochloremia but is otherwise at baseline. UA clean. UDS negative. Acute chest and abdominal xray is negative. CT head negative. Pt will require ongoing work up and will benefit from a social work consult as well. Spoke with Camila Li PA-C who will admit.  Final Clinical Impressions(s) / ED Diagnoses   Final diagnoses:  Acute encephalopathy    New Prescriptions New Prescriptions   No  medications on file     Bethel Born, PA-C 03/21/17 1511    Bethel Born, PA-C 03/21/17 1611    Nira Conn, MD 03/21/17 1706

## 2017-03-21 NOTE — Progress Notes (Signed)
Patient refuses orthostatic vitals. 

## 2017-03-21 NOTE — ED Notes (Signed)
Pt sign language interpretor here to help with communication for pt; pt here for gradual decline in health and inability for spouse to care for increasing needs; pt more lethargic today per interpretor

## 2017-03-22 DIAGNOSIS — Z79899 Other long term (current) drug therapy: Secondary | ICD-10-CM | POA: Diagnosis not present

## 2017-03-22 DIAGNOSIS — K5909 Other constipation: Secondary | ICD-10-CM | POA: Diagnosis present

## 2017-03-22 DIAGNOSIS — E039 Hypothyroidism, unspecified: Secondary | ICD-10-CM

## 2017-03-22 DIAGNOSIS — M6281 Muscle weakness (generalized): Secondary | ICD-10-CM | POA: Diagnosis not present

## 2017-03-22 DIAGNOSIS — D61818 Other pancytopenia: Secondary | ICD-10-CM | POA: Diagnosis present

## 2017-03-22 DIAGNOSIS — R29898 Other symptoms and signs involving the musculoskeletal system: Secondary | ICD-10-CM

## 2017-03-22 DIAGNOSIS — E43 Unspecified severe protein-calorie malnutrition: Secondary | ICD-10-CM

## 2017-03-22 DIAGNOSIS — N182 Chronic kidney disease, stage 2 (mild): Secondary | ICD-10-CM | POA: Diagnosis present

## 2017-03-22 DIAGNOSIS — R404 Transient alteration of awareness: Secondary | ICD-10-CM

## 2017-03-22 DIAGNOSIS — G894 Chronic pain syndrome: Secondary | ICD-10-CM | POA: Diagnosis not present

## 2017-03-22 DIAGNOSIS — M25512 Pain in left shoulder: Secondary | ICD-10-CM | POA: Diagnosis not present

## 2017-03-22 DIAGNOSIS — H9193 Unspecified hearing loss, bilateral: Secondary | ICD-10-CM

## 2017-03-22 DIAGNOSIS — K5900 Constipation, unspecified: Secondary | ICD-10-CM | POA: Diagnosis present

## 2017-03-22 DIAGNOSIS — Z681 Body mass index (BMI) 19 or less, adult: Secondary | ICD-10-CM | POA: Diagnosis not present

## 2017-03-22 DIAGNOSIS — R627 Adult failure to thrive: Secondary | ICD-10-CM

## 2017-03-22 DIAGNOSIS — F4323 Adjustment disorder with mixed anxiety and depressed mood: Secondary | ICD-10-CM | POA: Diagnosis present

## 2017-03-22 DIAGNOSIS — G8929 Other chronic pain: Secondary | ICD-10-CM | POA: Diagnosis not present

## 2017-03-22 DIAGNOSIS — H548 Legal blindness, as defined in USA: Secondary | ICD-10-CM

## 2017-03-22 DIAGNOSIS — G9340 Encephalopathy, unspecified: Principal | ICD-10-CM

## 2017-03-22 DIAGNOSIS — Z9119 Patient's noncompliance with other medical treatment and regimen: Secondary | ICD-10-CM | POA: Diagnosis not present

## 2017-03-22 DIAGNOSIS — H9190 Unspecified hearing loss, unspecified ear: Secondary | ICD-10-CM | POA: Diagnosis present

## 2017-03-22 LAB — HIV ANTIBODY (ROUTINE TESTING W REFLEX): HIV Screen 4th Generation wRfx: NONREACTIVE

## 2017-03-22 MED ORDER — BOOST / RESOURCE BREEZE PO LIQD
1.0000 | Freq: Three times a day (TID) | ORAL | Status: DC
  Administered 2017-03-22: 1 via ORAL

## 2017-03-22 MED ORDER — ENSURE ENLIVE PO LIQD
237.0000 mL | Freq: Three times a day (TID) | ORAL | Status: DC
  Administered 2017-03-22 – 2017-03-24 (×5): 237 mL via ORAL

## 2017-03-22 MED ORDER — LEVOTHYROXINE SODIUM 25 MCG PO TABS
125.0000 ug | ORAL_TABLET | Freq: Every day | ORAL | Status: DC
  Administered 2017-03-22 – 2017-03-24 (×3): 125 ug via ORAL
  Filled 2017-03-22 (×3): qty 1

## 2017-03-22 MED ORDER — ACETAMINOPHEN 500 MG PO TABS
500.0000 mg | ORAL_TABLET | Freq: Three times a day (TID) | ORAL | Status: DC
  Administered 2017-03-22 – 2017-03-24 (×5): 500 mg via ORAL
  Filled 2017-03-22 (×6): qty 1

## 2017-03-22 NOTE — Progress Notes (Signed)
Patient sleeping went down to 38 heart rate. Asymptomatic dr. Waymon AmatoHongalgi aware will continue to monitor. Been sustaining in 40's to 50's.

## 2017-03-22 NOTE — Evaluation (Signed)
Physical Therapy Evaluation Patient Details Name: Martin [name] MRN: [id number] DOB: 1956/10/04 Today's Date: 03/22/2017   History of Present Illness  Pt is a 61 y/o male admitted from home secondary to decreased responsiveness. CT scan was negative. PMH including but not limited to legally blind and hearing impaired.  Clinical Impression  Pt presented supine in bed with HOB elevated, awake and willing to participate in therapy session. Sign language interpreter was present throughout evaluation as well as pt's wife. Prior to admission, pt reported that he ambulated with use of bilateral axillary crutches or RW. Pt also stated that he occasionally requires assistance from his wife for ADLs. Pt lives with his wife in a ground level apartment with his wife who is with him 24/7. Pt ambulated within his room with use of crutches and min guard for safety. Pt stated that he is limited to household level ambulation secondary to pain and endurance limitations. PT recommending pt d/c home with w/c for community mobility. No further acute PT needs identified at this time. PT signing off.    Follow Up Recommendations Supervision/Assistance - 24 hour    Equipment Recommendations  Wheelchair (measurements PT);Wheelchair cushion (measurements PT)    Recommendations for Other Services       Precautions / Restrictions Precautions Precaution Comments: legally blind, hearing impaired Restrictions Weight Bearing Restrictions: No      Mobility  Bed Mobility Overal bed mobility: Needs Assistance Bed Mobility: Supine to Sit;Sit to Supine     Supine to sit: Min guard Sit to supine: Min guard   General bed mobility comments: increased time and effort, min guard for safety, use of bed rails  Transfers Overall transfer level: Needs assistance Equipment used: Crutches Transfers: Sit to/from Stand Sit to Stand: Min guard         General transfer comment: increased time, good technique with crutches,  min guard for safety  Ambulation/Gait Ambulation/Gait assistance: Min guard Ambulation Distance (Feet): 20 Feet Assistive device: Crutches Gait Pattern/deviations: Decreased step length - right;Decreased stance time - left;Decreased weight shift to left;Decreased dorsiflexion - left;Step-through pattern Gait velocity: decreased Gait velocity interpretation: Below normal speed for age/gender General Gait Details: pt with mild instability with ambulation using bilateral axillary crutches. pt with minimal weight bearing through L LE. Pt maintained L LE in ankle PF, knee flexion and hip ER.  Stairs            Wheelchair Mobility    Modified Rankin (Stroke Patients Only)       Balance Overall balance assessment: Needs assistance Sitting-balance support: Feet supported Sitting balance-Leahy Scale: Good     Standing balance support: During functional activity;Single extremity supported Standing balance-Leahy Scale: Poor Standing balance comment: pt reliant on at least single UE support                             Pertinent Vitals/Pain Pain Assessment: Faces Faces Pain Scale: Hurts a little bit Pain Location: L shoulder Pain Descriptors / Indicators: Sore Pain Intervention(s): Monitored during session;Repositioned    Home Living Family/patient expects to be discharged to:: Private residence Living Arrangements: Spouse/significant other Available Help at Discharge: Family;Available 24 hours/day Type of Home: Apartment Home Access: Level entry     Home Layout: One level Home Equipment: Walker - 2 wheels;Crutches;Shower seat      Prior Function Level of Independence: Needs assistance   Gait / Transfers Assistance Needed: pt ambulates with either RW or bilateral axillary  crutches. Pt stated that since his knee sx 4-5 years ago, he has been dependent on either crutches or a RW to ambulate  ADL's / Homemaking Assistance Needed: pt reported that he can mostly  dress and bathe himself but wife watches him for safety and occasionally assists        Hand Dominance   Dominant Hand: Left    Extremity/Trunk Assessment   Upper Extremity Assessment Upper Extremity Assessment: Generalized weakness    Lower Extremity Assessment Lower Extremity Assessment: Generalized weakness       Communication   Communication: Interpreter utilized;Deaf  Cognition Arousal/Alertness: Awake/alert Behavior During Therapy: WFL for tasks assessed/performed Overall Cognitive Status: Difficult to assess                                 General Comments: pt alert and following commands appropriately throughout evaluation with use of interpreter      General Comments      Exercises     Assessment/Plan    PT Assessment Patent does not need any further PT services  PT Problem List         PT Treatment Interventions      PT Goals (Current goals can be found in the Care Plan section)  Acute Rehab PT Goals Patient Stated Goal: decrease pain    Frequency     Barriers to discharge        Co-evaluation               AM-PAC PT "6 Clicks" Daily Activity  Outcome Measure Difficulty turning over in bed (including adjusting bedclothes, sheets and blankets)?: A Little Difficulty moving from lying on back to sitting on the side of the bed? : A Little Difficulty sitting down on and standing up from a chair with arms (e.g., wheelchair, bedside commode, etc,.)?: Total Help needed moving to and from a bed to chair (including a wheelchair)?: A Little Help needed walking in hospital room?: A Little Help needed climbing 3-5 steps with a railing? : A Little 6 Click Score: 16    End of Session Equipment Utilized During Treatment: Gait belt Activity Tolerance: Patient tolerated treatment well Patient left: in bed;with call bell/phone within reach;with family/visitor present Nurse Communication: Mobility status PT Visit Diagnosis: Other  abnormalities of gait and mobility (R26.89)    Time: 4098-1191 PT Time Calculation (min) (ACUTE ONLY): 25 min   Charges:   PT Evaluation $PT Eval Moderate Complexity: 1 Procedure PT Treatments $Therapeutic Activity: 8-22 mins   PT G Codes:   PT G-Codes **NOT FOR INPATIENT CLASS** Functional Assessment Tool Used: AM-PAC 6 Clicks Basic Mobility;Clinical judgement Functional Limitation: Mobility: Walking and moving around Mobility: Walking and Moving Around Current Status (Y7829): At least 40 percent but less than 60 percent impaired, limited or restricted Mobility: Walking and Moving Around Goal Status [phone]): At least 1 percent but less than 20 percent impaired, limited or restricted Mobility: Walking and Moving Around Discharge Status [phone]): At least 40 percent but less than 60 percent impaired, limited or restricted    [facility], [place], DPT [phone]   Alessandra Bevels Lamyiah Crawshaw 03/22/2017, 10:19 AM

## 2017-03-22 NOTE — Progress Notes (Signed)
Pt alerted, requesting to eat and drink. Frozen diner and drink delivered to pt, after set up, pt able to feed himself. Observed while eating, tolerated well, no coughing and signs of difficulty swallowing. Pt progressed to regular diet as ordered previously. Communicated with patient via writing on white board.
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
## 2017-03-22 NOTE — Consult Note (Signed)
[facility] Psychiatry Consult   Reason for Consult:  Psychosis and decreased level of consciousness Referring Physician:  Dr. Algis Liming Patient Identification: Martin [name] MRN:  [id number] Principal Diagnosis: Adjustment disorder with mixed anxiety and depressed mood Diagnosis:   Patient Active Problem List   Diagnosis Date Noted  . Lethargy [R53.83] 03/21/2017  . Acute encephalopathy [G93.40]   . FTT (failure to thrive) in adult [R62.7]   . Protein-calorie malnutrition, severe [E43] 12/23/2015  . Septic joint ([place]) [M00.9] 12/21/2015  . Chronic pain syndrome [G89.4] 12/21/2015  . Legally blind [H54.8] 12/21/2015  . Deaf [H91.90] 12/21/2015  . Hypothyroidism [E03.9] 12/21/2015  . Swelling of left hand [M79.89] 12/21/2015  . Constipation [K59.00]     Total Time spent with patient: 1 hour  Subjective:   Martin [name] is a 61 y.o. male patient admitted with Drowsiness and altered mental status.  HPI:  History is obtained by interpreter, Martin [name], and wife, as well as social work and case management from communication services  Martin [name] is a 61 y.o. male with medical history significant for Ogilvie's syndrome, Gout, History of septic arthritis of the right knee, legally blind, deaf, admitted to the hospital with the chief complaints of feeling drowsiness and altered mental status. Patient is somewhat vague, reluctant and guarded during this evaluation. He also reported he was given medication which is making him not to focus well on the questions. Reportedly patient has been noncompliant with his levothyroxine for hypothyroidism. Patient continued to report stomach pain, left shoulder pain and multiple body pains. Patient denied symptoms of depression, anxiety, auditory/visual hallucinations, delusions and paranoia. Patient does worried about his wife has been going to the bathroom more frequently and touching the his objects which he does not like. Patient believes his wife needed  counseling but he denies need of counseling or medication treatment for depression, anxiety and psychosis. Reportedly patient feels somebody is watching but does not elaborate. Patient case management from communication services feels that patient need mental health treatment because he has been paranoid, delusional and feels somebody will be watching them and trying to hurt them.   Past Psychiatric History: Patient was previously received medication treatment more than 2 years ago while living in [place] at [facility]. We do not have any records available from the [facility] during this evaluation.  Risk to Self: Is patient at risk for suicide?: No Risk to Others:   Prior Inpatient Therapy:   Prior Outpatient Therapy:    Past Medical History:  Past Medical History:  Diagnosis Date  . Chronic constipation   . Deaf   . Gout   . Hypothyroidism   . Legally blind   . Ogilvie's syndrome   . Pyogenic arthritis of right knee joint [facility])     Past Surgical History:  Procedure Laterality Date  . EYE SURGERY Right    "relieved fluid pressure and cleaned it out"  . KNEE ARTHROSCOPY Right 12/22/2015   Procedure: ARTHROSCOPY WASHOUT OF SEPTIC RIGHT KNEE;  Surgeon: Renette Butters, MD;  Location: [facility];  Service: Orthopedics;  Laterality: Right;  . KNEE SURGERY Left 2015/2016   Family History:  Family History  Problem Relation Age of Onset  . Family history unknown: Yes   Family Psychiatric  History: Unknown Social History:  History  Alcohol Use No     History  Drug Use No    Social History   Social History  . Marital status: Married    Spouse name: N/A  . Number of  children: N/A  . Years of education: N/A   Social History Main Topics  . Smoking status: Never Smoker  . Smokeless tobacco: Never Used  . Alcohol use No  . Drug use: No  . Sexual activity: Yes   Other Topics Concern  . None   Social History Narrative  . None   Additional Social History:    Allergies:  No Known  Allergies  Labs:  Results for orders placed or performed during the hospital encounter of 03/21/17 (from the past 48 hour(s))  CBC WITH DIFFERENTIAL     Status: Abnormal   Collection Time: 03/21/17 11:44 AM  Result Value Ref Range   WBC 3.0 (L) 4.0 - 10.5 K/uL   RBC 3.60 (L) 4.22 - 5.81 MIL/uL   Hemoglobin 10.8 (L) 13.0 - 17.0 g/dL   HCT 33.5 (L) 39.0 - 52.0 %   MCV 93.1 78.0 - 100.0 fL   MCH 30.0 26.0 - 34.0 pg   MCHC 32.2 30.0 - 36.0 g/dL   RDW 15.2 11.5 - 15.5 %   Platelets 121 (L) 150 - 400 K/uL   Neutrophils Relative % 51 %   Neutro Abs 1.5 (L) 1.7 - 7.7 K/uL   Lymphocytes Relative 37 %   Lymphs Abs 1.1 0.7 - 4.0 K/uL   Monocytes Relative 5 %   Monocytes Absolute 0.2 0.1 - 1.0 K/uL   Eosinophils Relative 6 %   Eosinophils Absolute 0.2 0.0 - 0.7 K/uL   Basophils Relative 1 %   Basophils Absolute 0.0 0.0 - 0.1 K/uL  Ammonia     Status: None   Collection Time: 03/21/17 11:44 AM  Result Value Ref Range   Ammonia 12 9 - 35 umol/L  Comprehensive metabolic panel     Status: Abnormal   Collection Time: 03/21/17 11:44 AM  Result Value Ref Range   Sodium 133 (L) 135 - 145 mmol/L   Potassium 3.9 3.5 - 5.1 mmol/L   Chloride 99 (L) 101 - 111 mmol/L   CO2 27 22 - 32 mmol/L   Glucose, Bld 93 65 - 99 mg/dL   BUN 15 6 - 20 mg/dL   Creatinine, Ser 1.44 (H) 0.61 - 1.24 mg/dL   Calcium 9.3 8.9 - 10.3 mg/dL   Total Protein 7.7 6.5 - 8.1 g/dL   Albumin 3.8 3.5 - 5.0 g/dL   AST 94 (H) 15 - 41 U/L   ALT 57 17 - 63 U/L   Alkaline Phosphatase 65 38 - 126 U/L   Total Bilirubin 0.7 0.3 - 1.2 mg/dL   GFR calc non Af Amer 52 (L) >60 mL/min   GFR calc Af Amer >60 >60 mL/min    Comment: (NOTE) The eGFR has been calculated using the CKD EPI equation. This calculation has not been validated in all clinical situations. eGFR's persistently <60 mL/min signify possible Chronic Kidney Disease.    Anion gap 7 5 - 15  Urinalysis, Routine w reflex microscopic     Status: None   Collection Time:  03/21/17  2:22 PM  Result Value Ref Range   Color, Urine YELLOW YELLOW   APPearance CLEAR CLEAR   Specific Gravity, Urine 1.009 1.005 - 1.030   pH 7.0 5.0 - 8.0   Glucose, UA NEGATIVE NEGATIVE mg/dL   Hgb urine dipstick NEGATIVE NEGATIVE   Bilirubin Urine NEGATIVE NEGATIVE   Ketones, ur NEGATIVE NEGATIVE mg/dL   Protein, ur NEGATIVE NEGATIVE mg/dL   Nitrite NEGATIVE NEGATIVE   Leukocytes, UA NEGATIVE NEGATIVE  Urine rapid drug screen (hosp performed)not at [facility]     Status: None   Collection Time: 03/21/17  2:22 PM  Result Value Ref Range   Opiates NONE DETECTED NONE DETECTED   Cocaine NONE DETECTED NONE DETECTED   Benzodiazepines NONE DETECTED NONE DETECTED   Amphetamines NONE DETECTED NONE DETECTED   Tetrahydrocannabinol NONE DETECTED NONE DETECTED   Barbiturates NONE DETECTED NONE DETECTED    Comment:        DRUG SCREEN FOR MEDICAL PURPOSES ONLY.  IF CONFIRMATION IS NEEDED FOR ANY PURPOSE, NOTIFY LAB WITHIN 5 DAYS.        LOWEST DETECTABLE LIMITS FOR URINE DRUG SCREEN Drug Class       Cutoff (ng/mL) Amphetamine      1000 Barbiturate      200 Benzodiazepine   267 Tricyclics       124 Opiates          300 Cocaine          300 THC              50   HIV antibody (Routine Testing)     Status: None   Collection Time: 03/21/17  6:03 PM  Result Value Ref Range   HIV Screen 4th Generation wRfx Non Reactive Non Reactive    Comment: (NOTE) Performed At: [facility] [place], [place] [id number] Lindon Romp MD SN:[id number]   Vitamin B12     Status: None   Collection Time: 03/21/17  6:03 PM  Result Value Ref Range   Vitamin B-12 599 180 - 914 pg/mL    Comment: (NOTE) This assay is not validated for testing neonatal or myeloproliferative syndrome specimens for Vitamin B12 levels.   Iron and TIBC     Status: Abnormal   Collection Time: 03/21/17  6:03 PM  Result Value Ref Range   Iron 41 (L) 45 - 182 ug/dL   TIBC 206 (L) 250 - 450 ug/dL    Saturation Ratios 20 17.9 - 39.5 %   UIBC 165 ug/dL  Ferritin     Status: None   Collection Time: 03/21/17  6:03 PM  Result Value Ref Range   Ferritin 136 24 - 336 ng/mL  Reticulocytes     Status: Abnormal   Collection Time: 03/21/17  6:03 PM  Result Value Ref Range   Retic Ct Pct 0.7 0.4 - 3.1 %   RBC. 3.84 (L) 4.22 - 5.81 MIL/uL   Retic Count, Manual 26.9 19.0 - 186.0 K/uL  TSH     Status: Abnormal   Collection Time: 03/21/17  6:06 PM  Result Value Ref Range   TSH 42.491 (H) 0.350 - 4.500 uIU/mL    Comment: Performed by a 3rd Generation assay with a functional sensitivity of <=0.01 uIU/mL.  Folate     Status: None   Collection Time: 03/21/17  6:06 PM  Result Value Ref Range   Folate 17.7 >5.9 ng/mL  Prealbumin     Status: Abnormal   Collection Time: 03/21/17  6:06 PM  Result Value Ref Range   Prealbumin 8.7 (L) 18 - 38 mg/dL    Current Facility-Administered Medications  Medication Dose Route Frequency Provider Last Rate Last Dose  . 0.9 %  sodium chloride infusion   Intravenous Continuous Rondel Jumbo, PA-C 100 mL/hr at 03/22/17 0315    . acetaminophen (TYLENOL) tablet 650 mg  650 mg Oral Q6H PRN Rondel Jumbo, PA-C       Or  .  acetaminophen (TYLENOL) suppository 650 mg  650 mg Rectal Q6H PRN Rondel Jumbo, PA-C      . bisacodyl (DULCOLAX) suppository 10 mg  10 mg Rectal Daily PRN Rondel Jumbo, PA-C      . colchicine tablet 0.6 mg  0.6 mg Oral Daily Rondel Jumbo, PA-C   0.6 mg at 03/22/17 0820  . dorzolamide-timolol (COSOPT) 22.3-6.8 MG/ML ophthalmic solution 1 drop  1 drop Both Eyes BID Rondel Jumbo, PA-C   1 drop at 03/22/17 0820  . fentaNYL (SUBLIMAZE) injection 25 mcg  25 mcg Intravenous Once Waldemar Dickens, MD      . latanoprost (XALATAN) 0.005 % ophthalmic solution 1 drop  1 drop Both Eyes QHS Rondel Jumbo, PA-C   1 drop at 03/21/17 2108  . levothyroxine (SYNTHROID, LEVOTHROID) tablet 125 mcg  125 mcg Oral QAC breakfast Modena Jansky, MD   125  mcg at 03/22/17 1030  . ondansetron (ZOFRAN) tablet 4 mg  4 mg Oral Q6H PRN Rondel Jumbo, PA-C       Or  . ondansetron (ZOFRAN) injection 4 mg  4 mg Intravenous Q6H PRN Wertman, Sara E, PA-C      . senna-docusate (Senokot-S) tablet 1 tablet  1 tablet Oral QHS PRN Rondel Jumbo, PA-C        Musculoskeletal: Strength & Muscle Tone: decreased Gait & Station: unable to stand Patient leans: N/A  Psychiatric Specialty Exam: Physical Exam as per history and physical   ROS presented with lethargy which is improved since admitted to the hospital and start of levo-thyroxin.  Patient is legally blind and deaf and dumb. Patient is wearing very thick eyeglasses and using cotton earplugs. No Fever-chills, No Headache, No changes with Vision or hearing, reports vertigo No problems swallowing food or Liquids, No Chest pain, Cough or Shortness of Breath, No Abdominal pain, No Nausea or Vommitting, Bowel movements are regular, No Blood in stool or Urine, No dysuria, No new skin rashes or bruises, No new joints pains-aches,  No new weakness, tingling, numbness in any extremity, No recent weight gain or loss, No polyuria, polydypsia or polyphagia,  A full 10 point Review of Systems was done, except as stated above, all other Review of Systems were negative.  Blood pressure 109/67, pulse (!) 59, temperature 98.3 F (36.8 C), resp. rate 18, height '5\' 6"'$  (1.676 m), weight 50.4 kg (111 lb 1.6 oz), SpO2 100 %.Body mass index is 17.93 kg/m.  General Appearance: Guarded  Eye Contact:  Fair  Speech:  used sign language through interpreter.  Volume:  NA  Mood:  Anxious  Affect:  Appropriate and Congruent  Thought Process:  Coherent and Goal Directed  Orientation:  Full (Time, Place, and Person)  Thought Content:  WDL  Suicidal Thoughts:  No  Homicidal Thoughts:  No  Memory:  Immediate;   Fair Recent;   Fair Remote;   Fair  Judgement:  Fair  Insight:  Fair  Psychomotor Activity:  Decreased   Concentration:  Concentration: Fair and Attention Span: Fair  Recall:  Good  Fund of Knowledge:  Fair  Language:  uses sign language appropriate  Akathisia:  Negative  Handed:  Right  AIMS (if indicated):     Assets:  Communication Skills Desire for Improvement Housing Leisure Time Resilience Social Support  ADL's:  Impaired  Cognition:  WNL  Sleep:        Treatment Plan Summary: 61 years old male who is legally blind and  deaf and dumb interviewed through the interpreter and case managed for this evaluation. Patient presented with altered mental status and dizziness. Reportedly patient is noncompliant with medication. Patient also suffering with multiple psychosocial stresses and worried about losing his place and not having utilities.  Patient denied suicidal/homicidal ideation, intention or plans.  Patient does not appear to be responding to internal stimuli.  Patient dismisses current acute mental illness  Recommended no psychotropic medications at this visit   Appreciate psychiatric consultation and we sign off as of today Please contact [phone] or [phone] if needs further assistance   Disposition: Patient will be referred to the outpatient medication management No evidence of imminent risk to self or others at present.   Patient does not meet criteria for psychiatric inpatient admission. Supportive therapy provided about ongoing stressors.  Ambrose Finland, MD 03/22/2017 12:12 PM

## 2017-03-22 NOTE — Progress Notes (Signed)
Initial Nutrition Assessment  DOCUMENTATION CODES:   Underweight  INTERVENTION:   -D/c Boost Breeze po TID, each supplement provides 250 kcal and 9 grams of protein -Ensure Enlive po TID, each supplement provides 350 kcal and 20 grams of protein  NUTRITION DIAGNOSIS:   Predicted suboptimal nutrient intake related to lethargy/confusion as evidenced by meal completion < 25%.  GOAL:   Patient will meet greater than or equal to 90% of their needs  MONITOR:   PO intake, Supplement acceptance, Diet advancement, Weight trends, Labs, Skin, I & O's  REASON FOR ASSESSMENT:   Consult Assessment of nutrition requirement/status  ASSESSMENT:   Martin [name] is a 61 y.o. male with medical history significant for Ogilvie's syndrome, Gout, History of septic arthritis of the right knee, legally biind, deaf, who has not been seen by a PCP over the last year, has not been taking regularly his medications since, who presented to the ED due to gradual decline of his overall health status, with decreased level of responsiveness.   Pt admitted with generalized weakness.   Attempted to see pt x 3, however, pt in with multiple staff members at time of visits (MD, RNCM, and CSW). Unable to obtain further nutrition history and perform nutrition-focused physical exam at this time. Exam also deferred due to pt complaints of pain, per neurology note.   Observed pt consuming lunch; pt consumed less than 25% of lunch tray. Noted unopened Boost Breeze supplement at bedside.   Reviewed wt hx. Noted wt stability over the past > 1 year. Suspect wt from 01/03/17 is an outlier.   Pt with multiple complicated social issues, including eviction and potential psych hx. Given underweight status and poor oral intake, suspect pt may have malnutrition, however, unable to confirm at this time.   Labs reviewed.   Diet Order:  Diet regular Room service appropriate? Yes; Fluid consistency: Thin  Skin:  Reviewed, no  issues  Last BM:  PTA  Height:   Ht Readings from Last 1 Encounters:  03/21/17 5\' 6"  (1.676 m)    Weight:   Wt Readings from Last 1 Encounters:  03/22/17 111 lb 1.6 oz (50.4 kg)    Ideal Body Weight:  64.5 kg  BMI:  Body mass index is 17.93 kg/m.  Estimated Nutritional Needs:   Kcal:  1550-1750  Protein:  75-90 grams  Fluid:  > 1.5 L  EDUCATION NEEDS:   Education needs no appropriate at this time  Kwabena Strutz A. Mayford KnifeWilliams, RD, LDN, CDE Pager: [phone] After hours Pager: [phone]

## 2017-03-22 NOTE — Progress Notes (Addendum)
PROGRESS NOTE   Darletta MollLarry Olver  ZOX:[id number]    DOB: 1956/03/24    DOA: 03/21/2017  PCP: Alain MarionPa, [facility]   I have briefly reviewed patients [facility].  Brief Narrative:  61 year old married male, PMH of deafness, legally blind, ongoing significant social issues (has been given 30 day notice for eviction from current home), hypothyroid, Ogilvie's syndrome, gout, septic arthritis of right knee, chronic pain, ongoing failure to thrive, suspected medical noncompliance, has not followed with PCP for some time, chronically ambulates with the help of crutches, during a routine visit by member of a nonprofit organization on day of admission noted that he was significantly altered compared to 2 weeks prior (not speaking or interacting, ambulating with help of his crutches but mostly looking at floor) and left upper extremity weakness following which EMS was called and patient was brought to ED for further evaluation. Seen in the ED on 03/08/17 for left shoulder pain, x-rays had shown evidence of OA and was discharged on pain medications.   Assessment & Plan:   Active Problems:   Chronic pain syndrome   Legally blind   Deaf   Hypothyroidism   Swelling of left hand   Constipation   Protein-calorie malnutrition, severe   Lethargy   Acute encephalopathy   FTT (failure to thrive) in adult   1. Altered mental status/less responsive: Triggered admission to hospital. Unclear etiology. Seems to have resolved and mental status back to baseline as per discussion with members of nonprofit organization who know him for a couple of years. CT head without acute findings. No obvious infectious etiology identified (chest x-ray without acute findings and urine microscopy negative). Blood work significant for TSH 42.49 (was 72.5 in April 2017) which could contribute. Also seems to have underlying psychiatric issues, see below. Treat underlying etiology and monitor closely. UDS  negative. 2. Hypothyroid: TSH 42.49. Suspect noncompliance. Resume prior home dose of Synthroid. Will need repeat TSH in 4-6 weeks. Outpatient compliance needs to be ensured and may be difficult given social issues. 3. Psychiatric/paranoia: Psychiatric consulted for evaluation on 6/13. 4. Left upper extremity weakness/shoulder pain: Reported left upper extremity weakness noted by member of nonprofit organization on 6/12. Patient does report pain in the left shoulder. He is left-handed. He chronically uses crutches to ambulate. Not sure if left upper extremity weakness was related to pain or has neurological deficit-despite being left-handed, left upper extremity muscle wasting and grade 4 x 5 power.? Compressive neuropathy or other etiology. Consult neurology for opinion. 5. Left shoulder pain: X-ray 03/08/17 showed no acute findings but mild glenohumeral joint osteoarthritis. No acute findings on exam except painful and limited range of movements. Supportive treatment. Outpatient follow-up with orthopedics. 6. Left knee pain: No acute findings on local exam except painful range of movements and mild tenderness. Low index of suspicion for septic arthritis in the absence of fevers or other local findings. ? OA 7. Stage II chronic kidney disease: Creatinine appears to be at baseline. Follow BMP periodically. 8. Pancytopenia: Unclear etiology.? Iron deficiency. B12 and folate okay. Iron supplements. Consider outpatient hematology consultation and workup. HIV antibody nonreactive. 9. Ogilvie's: Chronic abdominal distention without acute illness. 10. Severe protein calorie malnutrition: Dietitian consultation. 11. Social issues: Recently received eviction notice. Clinical social worker follow-up. 12. Adult failure to thrive: Multifactorial due to social issues and multiple medical/psychiatric illness. 13. History of gout: No acute flare noted.   DVT prophylaxis: SCDs. Patient refused subcutaneous  heparin. Code Status: Full  Family Communication: Discussed with spouse at bedside Disposition: To be determined   Consultants:  Psychiatry Neurology   Procedures:  None  Antimicrobials:  None    Subjective: Seen this morning. Interviewed and examined using a sign interpreter. Patient's RN, CCM, CSW, [employer] present. As per spouse and community members, mental status back to baseline. They did report that patient has paranoia (suspects that his wife is going to poison him, asks apartment people to double lock his door so that someone will not come and molest his wife, asks his wife to wrap wet cloth on smoke detector so cameras will not see him). Patient reports left shoulder pain, ongoing for at least 2 weeks if not longer. Worse with movement. Also reports left knee pain, chronic. Denies abdominal pain. Having BMs. No nausea or vomiting.   ROS: No chest pain or dyspnea reported. Claims compliance with medications but suspect otherwise.  Objective:  Vitals:   03/21/17 1726 03/21/17 2100 03/22/17 0500 03/22/17 0636  BP:  111/76  109/67  Pulse:  (!) 50  (!) 59  Resp:  17  18  Temp:  98.4 F (36.9 C)  98.3 F (36.8 C)  TempSrc:  Oral    SpO2:  100%  100%  Weight: 50.1 kg (110 lb 6.4 oz)  50.4 kg (111 lb 1.6 oz)   Height: 5\' 6"  (1.676 m)       Examination:  General exam: Middle-aged male, small built, cachectic, extremely frail and chronically ill-looking lying comfortably propped up in bed. Respiratory system: Clear to auscultation. Respiratory effort normal. Cardiovascular system: S1 & S2 heard, RRR. No JVD, murmurs, rubs, gallops or clicks. No pedal edema. Telemetry: Mostly sinus bradycardia in the 50s but at times dips into the 40s and high 30s but asymptomatic. No sinus pauses. Gastrointestinal system: Abdomen is distended, tympanitic, soft and nontender. No organomegaly or masses felt. Diminished bowel sounds heard. Central nervous  system: Alert and oriented 2. No cranial nerve deficits except known legal blindness. Extremities: Grade 5 x 5 power in right upper extremity, grade 4 x 5 power in right lower extremity. At least grade 4 x 5 power in left lower extremity-limited by left knee pain. Left upper extremity wasting and multiple muscle compartments especially in the hands and forearm. At least grade 4 x 5 power but limited assessment due to left shoulder pain. No acute findings on left shoulder and left knee exam. Skin: No rashes, lesions or ulcers Psychiatry: Judgement and insight impaired. Mood & affect appropriate.     Data Reviewed: I have personally reviewed following labs and imaging studies  CBC:  Recent Labs Lab 03/21/17 1144  WBC 3.0*  NEUTROABS 1.5*  HGB 10.8*  HCT 33.5*  MCV 93.1  PLT 121*   Basic Metabolic Panel:  Recent Labs Lab 03/21/17 1144  NA 133*  K 3.9  CL 99*  CO2 27  GLUCOSE 93  BUN 15  CREATININE 1.44*  CALCIUM 9.3   Liver Function Tests:  Recent Labs Lab 03/21/17 1144  AST 94*  ALT 57  ALKPHOS 65  BILITOT 0.7  PROT 7.7  ALBUMIN 3.8       Radiology Studies: Ct Head Wo Contrast  Result Date: 03/21/2017 CLINICAL DATA:  Increasing lethargy and decreased level of consciousness over the past few days. EXAM: CT HEAD WITHOUT CONTRAST TECHNIQUE: Contiguous axial images were obtained from the base of the skull through the vertex without intravenous contrast. COMPARISON:  None. FINDINGS: Brain: Appears normal  without hemorrhage, infarct, mass lesion, mass effect, midline shift or abnormal extra-axial fluid collection. No hydrocephalus or pneumocephalus. Vascular: Mild atherosclerosis noted. Skull: Negative. Sinuses/Orbits: Negative. Other: Degenerative disease about the TMJs is seen bilaterally. IMPRESSION: No acute intracranial abnormality. Degenerative disease about the TMJs bilaterally. Electronically Signed   By: Drusilla Kanner M.D.   On: 03/21/2017 12:52   Dg Abd  Acute W/chest  Result Date: 03/21/2017 CLINICAL DATA:  Ogilvie's syndrome.  Altered mental status. EXAM: DG ABDOMEN ACUTE W/ 1V CHEST COMPARISON:  01/04/2017 CT abdomen/ pelvis and abdominal radiograph. FINDINGS: Low lung volumes. Top-normal heart size. Otherwise normal mediastinal contour. No pneumothorax. No pleural effusion. Stable chronic elevation of the left hemidiaphragm. No overt pulmonary edema. No acute consolidative airspace disease. Marked diffuse gaseous distention of the large bowel with moderate colonic stool volume, a chronic finding, not appreciably changed since 01/04/2017. No disproportionately dilated small bowel loops or air-fluid levels. No evidence of pneumatosis or pneumoperitoneum. Mild thoracolumbar spondylosis. IMPRESSION: 1. No evidence of small-bowel obstruction . 2. Chronic marked diffuse gaseous distention of the large bowel, compatible with Ogilvie's Syndrome . 3. Associated chronic elevation of left hemidiaphragm. Low lung volumes. No acute cardiopulmonary disease. Electronically Signed   By: Delbert Phenix M.D.   On: 03/21/2017 12:42        Scheduled Meds: . colchicine  0.6 mg Oral Daily  . dorzolamide-timolol  1 drop Both Eyes BID  . feeding supplement  1 Container Oral TID BM  . fentaNYL (SUBLIMAZE) injection  25 mcg Intravenous Once  . latanoprost  1 drop Both Eyes QHS  . levothyroxine  125 mcg Oral QAC breakfast   Continuous Infusions: . sodium chloride 100 mL/hr at 03/22/17 0315     LOS: 0 days     Nzinga Ferran, MD, FACP, FHM. [facility] Pager [phone]  If 7PM-7AM, please contact night-coverage [email or web address] Password [email or web address] 03/22/2017, 1:25 PM
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
## 2017-03-23 ENCOUNTER — Inpatient Hospital Stay (HOSPITAL_COMMUNITY): Payer: Medicare Other

## 2017-03-23 DIAGNOSIS — M25512 Pain in left shoulder: Secondary | ICD-10-CM

## 2017-03-23 DIAGNOSIS — M6281 Muscle weakness (generalized): Secondary | ICD-10-CM

## 2017-03-23 DIAGNOSIS — G8929 Other chronic pain: Secondary | ICD-10-CM

## 2017-03-23 LAB — CBC WITH DIFFERENTIAL/PLATELET
BASOS PCT: 1 %
Basophils Absolute: 0 10*3/uL (ref 0.0–0.1)
EOS ABS: 0.1 10*3/uL (ref 0.0–0.7)
EOS PCT: 4 %
HCT: 30.4 % — ABNORMAL LOW (ref 39.0–52.0)
Hemoglobin: 10 g/dL — ABNORMAL LOW (ref 13.0–17.0)
Lymphocytes Relative: 40 %
Lymphs Abs: 1.3 10*3/uL (ref 0.7–4.0)
MCH: 30.6 pg (ref 26.0–34.0)
MCHC: 32.9 g/dL (ref 30.0–36.0)
MCV: 93 fL (ref 78.0–100.0)
MONO ABS: 0.2 10*3/uL (ref 0.1–1.0)
Monocytes Relative: 7 %
NEUTROS ABS: 1.7 10*3/uL (ref 1.7–7.7)
Neutrophils Relative %: 48 %
PLATELETS: DECREASED 10*3/uL (ref 150–400)
RBC: 3.27 MIL/uL — ABNORMAL LOW (ref 4.22–5.81)
RDW: 15.3 % (ref 11.5–15.5)
WBC: 3.3 10*3/uL — ABNORMAL LOW (ref 4.0–10.5)

## 2017-03-23 LAB — BASIC METABOLIC PANEL
Anion gap: 6 (ref 5–15)
BUN: 13 mg/dL (ref 6–20)
CO2: 19 mmol/L — ABNORMAL LOW (ref 22–32)
CREATININE: 1.13 mg/dL (ref 0.61–1.24)
Calcium: 8.1 mg/dL — ABNORMAL LOW (ref 8.9–10.3)
Chloride: 109 mmol/L (ref 101–111)
GFR calc Af Amer: >60 mL/min (ref >60)
GLUCOSE: 101 mg/dL — AB (ref 65–99)
Potassium: 3.9 mmol/L (ref 3.5–5.1)
Sodium: 134 mmol/L — ABNORMAL LOW (ref 135–145)

## 2017-03-23 NOTE — Evaluation (Signed)
Occupational Therapy Evaluation Patient Details Name: Martin [name] MRN: [id number] DOB: 1956/09/20 Today's Date: 03/23/2017    History of Present Illness Pt is a 61 y/o male admitted from home secondary to decreased responsiveness. CT scan was negative. PMH including but not limited to legally blind and hearing impaired.   Clinical Impression   Pt is performing mobility and ADL at a supervision level. Presents with L and hand shoulder pain, wife reporting x 1 year. Xray significant for arthritis, MRI pending. Pt is a long term user of crutches or a walker, likely contributing. Educated in gentle AAROM in supine within pt's tolerance with pt and wife demonstrating understanding. No further OT needs.   Follow Up Recommendations  No OT follow up    Equipment Recommendations  None recommended by OT    Recommendations for Other Services       Precautions / Restrictions Precautions Precaution Comments: legally blind, hearing impaired Restrictions Weight Bearing Restrictions: No      Mobility Bed Mobility   Bed Mobility: Supine to Sit;Sit to Supine     Supine to sit: Supervision Sit to supine: Supervision   General bed mobility comments: increased time and effort, supervision for safety, use of bed rails  Transfers Overall transfer level: Needs assistance Equipment used: Crutches Transfers: Sit to/from Stand Sit to Stand: Supervision         General transfer comment: increased time, good technique with crutches, min guard for safety    Balance Overall balance assessment: Needs assistance   Sitting balance-Leahy Scale: Good       Standing balance-Leahy Scale: Poor Standing balance comment: pt reliant on at least single UE support                           ADL either performed or assessed with clinical judgement   ADL Overall ADL's : At baseline                                             Vision Baseline Vision/History: Legally  blind Patient Visual Report: No change from baseline       Perception     Praxis      Pertinent Vitals/Pain Pain Assessment: Faces Faces Pain Scale: Hurts little more Pain Location: L shoulder Pain Descriptors / Indicators: Sore;Grimacing;Guarding Pain Intervention(s): Monitored during session     Hand Dominance Left   Extremity/Trunk Assessment Upper Extremity Assessment Upper Extremity Assessment: LUE deficits/detail LUE Deficits / Details: painful L shoulder 45 degrees FF, full AROM elbow to hand, per chart, neuro has evaluated, wife reports pain x 1 year, L hand pain with mild edema LUE: Unable to fully assess due to pain LUE Coordination: decreased gross motor   Lower Extremity Assessment Lower Extremity Assessment: Defer to PT evaluation       Communication Communication Communication: Deaf (wife interpreted, used written notes )   Cognition Arousal/Alertness: Awake/alert Behavior During Therapy: Flat affect Overall Cognitive Status: Difficult to assess                                 General Comments: pt alert and following commands and responding to questions appropriately   General Comments       Exercises     Shoulder Instructions  Home Living Family/patient expects to be discharged to:: Private residence Living Arrangements: Spouse/significant other Available Help at Discharge: Family;Available 24 hours/day Type of Home: Apartment Home Access: Level entry     Home Layout: One level     Bathroom Shower/Tub: Chief Strategy OfficerTub/shower unit   Bathroom Toilet: Standard     Home Equipment: Environmental consultantWalker - 2 wheels;Crutches;Shower seat          Prior Functioning/Environment Level of Independence: Independent with assistive device(s)  Gait / Transfers Assistance Needed: pt ambulates with either RW or bilateral axillary crutches. Pt stated that since his knee sx 4-5 years ago, he has been dependent on either crutches or a RW to ambulate ADL's /  Homemaking Assistance Needed: wife reports no difficulty with ADL, wife completes meals and housekeeping            OT Problem List: Decreased strength;Decreased range of motion;Impaired balance (sitting and/or standing);Impaired vision/perception;Pain;Impaired UE functional use      OT Treatment/Interventions:      OT Goals(Current goals can be found in the care plan section) Acute Rehab OT Goals Patient Stated Goal: to go home  OT Frequency:     Barriers to D/C:            Co-evaluation              AM-PAC PT "6 Clicks" Daily Activity     Outcome Measure Help from another person eating meals?: None Help from another person taking care of personal grooming?: A Little Help from another person toileting, which includes using toliet, bedpan, or urinal?: A Little Help from another person bathing (including washing, rinsing, drying)?: A Little Help from another person to put on and taking off regular upper body clothing?: None Help from another person to put on and taking off regular lower body clothing?: A Little 6 Click Score: 20   End of Session Equipment Utilized During Treatment: Gait belt (crutches)  Activity Tolerance: Patient tolerated treatment well Patient left: in bed;with call bell/phone within reach;with family/visitor present  OT Visit Diagnosis: Unsteadiness on feet (R26.81);Pain Pain - Right/Left: Left Pain - part of body: Shoulder                Time: [id number] OT Time Calculation (min): 18 min Charges:  OT General Charges $OT Visit: 1 Procedure OT Evaluation $OT Eval Moderate Complexity: 1 Procedure G-Codes:      Evern BioMayberry, Win Guajardo Lynn 03/23/2017, 2:48 PM  [phone]

## 2017-03-23 NOTE — Progress Notes (Signed)
PROGRESS NOTE   Martin [name]  ZOX:[id number]    DOB: 09/28/1956    DOA: 03/21/2017  [name]   I have briefly reviewed patients previous medical records in [facility].  Brief Narrative:  61 year old married male, PMH of deafness, legally blind, ongoing significant social issues (has been given 30 day notice for eviction from current home), hypothyroid, Ogilvie's syndrome, gout, septic arthritis of right knee, chronic pain, ongoing failure to thrive, suspected medical noncompliance, has not followed with PCP for some time, chronically ambulates with the help of crutches, during a routine visit by member of a nonprofit organization on day of admission noted that he was significantly altered compared to 2 weeks prior (not speaking or interacting, ambulating with help of his crutches but mostly looking at floor) and left upper extremity weakness following which EMS was called and patient was brought to ED for further evaluation. Seen in the ED on 03/08/17 for left shoulder pain, x-rays had shown evidence of OA and was discharged on pain medications.Neurology input appreciated: Getting MRI of C-spine and left shoulder.   Assessment & Plan:   Principal Problem:   Adjustment disorder with mixed anxiety and depressed mood Active Problems:   Chronic pain syndrome   Legally blind   Deaf   Hypothyroidism   Swelling of left hand   Constipation   Protein-calorie malnutrition, severe   Lethargy   Acute encephalopathy   FTT (failure to thrive) in adult   1. Altered mental status/less responsive: Triggered admission to hospital. Unclear etiology. Resolved and mental status back to baseline. CT head without acute findings. No obvious infectious etiology identified (chest x-ray without acute findings and urine microscopy negative). Blood work significant for TSH 42.49 (was 72.5 in April 2017) which could contribute. Also seems to have underlying psychiatric issues, see below. UDS  negative. Stable without change. 2. Hypothyroid: TSH 42.49. Suspect noncompliance. Resume prior home dose of Synthroid. Will need repeat TSH in 4-6 weeks. Outpatient compliance needs to be ensured and may be difficult given social issues. 3. Psychiatric/paranoia: Psychiatry consultation from 6/13 appreciated and no significant recommendations made. 4. Left upper extremity weakness/shoulder pain: Reported left upper extremity weakness noted by member of nonprofit organization on 6/12. Patient does report pain in the left shoulder. He is left-handed. He chronically uses crutches to ambulate. Neurology consultation 6/13 appreciated and feels that his LUE movement restrictions are mostly related to left shoulder pain but cannot definitely rule out neurological causes and recommend MRI of C-spine and shoulder to evaluate-patient agreeable, ordered. 5. Left shoulder pain: X-ray 03/08/17 showed no acute findings but mild glenohumeral joint osteoarthritis. No acute findings on exam except painful and limited range of movements. Supportive treatment. Outpatient follow-up with orthopedics. Follow-up MRI left shoulder. 6. Left knee pain: No acute findings on local exam except painful range of movements and mild tenderness. Low index of suspicion for septic arthritis in the absence of fevers or other local findings. ? OA. Outpatient follow-up with orthopedics. 7. Stage II chronic kidney disease: Creatinine appears to be at baseline. Creatinine has normalized. 8. Pancytopenia: Unclear etiology.? Iron deficiency. B12 and folate okay. Iron supplements. Consider outpatient hematology consultation and workup. HIV antibody nonreactive. Stable. 9. Ogilvie's: Chronic abdominal distention without acute illness. Having BMs. 10. Severe protein calorie malnutrition: Dietitian consultation. 11. Social issues: Recently received eviction notice. Clinical social worker follow-up. As per report, patient declines SNF and will be  discharged home when ready. 12. Adult failure to thrive: Multifactorial due to  social issues and multiple medical/psychiatric illness. 13. History of gout: No acute flare noted. 14. Asymptomatic sinus bradycardia: Possibly due to untreated hypothyroid. Continue Synthroid.   DVT prophylaxis: SCDs. Patient refused subcutaneous heparin. Code Status: Full Family Communication: Discussed with spouse at bedside Disposition: DC home, possibly 6/15.   Consultants:  Psychiatry Neurology   Procedures:  None  Antimicrobials:  None    Subjective: Patient interviewed and examined with interpreter at bedside. Ongoing issues with pain in left shoulder. Denies any other complaints. Had BM yesterday. Asking when he can go home. Denies any implants or metal objects in his body after repeated questioning.  ROS: No chest pain or dyspnea reported. Claims compliance with medications but suspect otherwise.  Objective:  Vitals:   03/22/17 0636 03/22/17 1448 03/22/17 2126 03/23/17 0549  BP: 109/67 (!) 130/106 105/60 98/69  Pulse: (!) 59 (!) 51 60 (!) 50  Resp: 18 19 20 14   Temp: 98.3 F (36.8 C)  98.6 F (37 C) 98.4 F (36.9 C)  TempSrc:   Oral   SpO2: 100% 92% 100% 98%  Weight:      Height:        Examination:  General exam: Middle-aged male, small built, cachectic, extremely frail and chronically ill-looking Sitting up comfortably in bed getting ready to eat breakfast this morning. Respiratory system: Clear to auscultation. Respiratory effort normal. Stable. Cardiovascular system: S1 & S2 heard, RRR. No JVD, murmurs, rubs, gallops or clicks. No pedal edema. Telemetry: Sinus bradycardia in the 40s/50s without pauses. Gastrointestinal system: Abdomen is distended, tympanitic, soft and nontender. No organomegaly or masses felt. Diminished bowel sounds heard. Stable. Central nervous system: Alert and oriented 2. No cranial nerve deficits except known legal blindness. Stable. Extremities:  Grade 5 x 5 power in right upper extremity, grade 4 x 5 power in right lower extremity. At least grade 4 x 5 power in left lower extremity-limited by left knee pain. Left upper extremity wasting and multiple muscle compartments especially in the hands and forearm. At least grade 4 x 5 power but limited assessment due to left shoulder pain. No acute findings on left shoulder and left knee exam. No change in exam. Skin: No rashes, lesions or ulcers Psychiatry: Judgement and insight impaired. Mood & affect appropriate.     Data Reviewed: I have personally reviewed following labs and imaging studies  CBC:  Recent Labs Lab 03/21/17 1144 03/23/17 0603  WBC 3.0* 3.3*  NEUTROABS 1.5* 1.7  HGB 10.8* 10.0*  HCT 33.5* 30.4*  MCV 93.1 93.0  PLT 121* PLATELET CLUMPS NOTED ON SMEAR, COUNT APPEARS DECREASED   Basic Metabolic Panel:  Recent Labs Lab 03/21/17 1144 03/23/17 0603  NA 133* 134*  K 3.9 3.9  CL 99* 109  CO2 27 19*  GLUCOSE 93 101*  BUN 15 13  CREATININE 1.44* 1.13  CALCIUM 9.3 8.1*   Liver Function Tests:  Recent Labs Lab 03/21/17 1144  AST 94*  ALT 57  ALKPHOS 65  BILITOT 0.7  PROT 7.7  ALBUMIN 3.8       Radiology Studies: No results found.      Scheduled Meds: . acetaminophen  500 mg Oral TID  . colchicine  0.6 mg Oral Daily  . dorzolamide-timolol  1 drop Both Eyes BID  . feeding supplement (ENSURE ENLIVE)  237 mL Oral TID BM  . fentaNYL (SUBLIMAZE) injection  25 mcg Intravenous Once  . latanoprost  1 drop Both Eyes QHS  . levothyroxine  125 mcg Oral QAC  breakfast   Continuous Infusions: . sodium chloride 50 mL/hr at 03/23/17 0556     LOS: 1 day     Hani Campusano, MD, FACP, FHM. [facility] Pager [phone]  If 7PM-7AM, please contact night-coverage [email or web address] Password [facility] 03/23/2017, 12:45 PM

## 2017-03-24 ENCOUNTER — Telehealth: Payer: Self-pay

## 2017-03-24 LAB — CBC
HCT: 32.3 % — ABNORMAL LOW (ref 39.0–52.0)
HEMOGLOBIN: 10.6 g/dL — AB (ref 13.0–17.0)
MCH: 29.9 pg (ref 26.0–34.0)
MCHC: 32.8 g/dL (ref 30.0–36.0)
MCV: 91 fL (ref 78.0–100.0)
PLATELETS: 135 10*3/uL — AB (ref 150–400)
RBC: 3.55 MIL/uL — AB (ref 4.22–5.81)
RDW: 15.1 % (ref 11.5–15.5)
WBC: 3.5 10*3/uL — ABNORMAL LOW (ref 4.0–10.5)

## 2017-03-24 MED ORDER — POLYETHYLENE GLYCOL 3350 17 G PO PACK: 17.0000 g | PACK | Freq: Every day | ORAL | 0 refills | Status: DC

## 2017-03-24 MED ORDER — SENNA 8.6 MG PO TABS: 1.0000 | ORAL_TABLET | Freq: Two times a day (BID) | ORAL | 0 refills | Status: DC | PRN

## 2017-03-24 MED ORDER — LEVOTHYROXINE SODIUM 125 MCG PO TABS: 125.0000 ug | ORAL_TABLET | Freq: Every day | ORAL | 0 refills | Status: DC

## 2017-03-24 MED ORDER — COLCHICINE 0.6 MG PO TABS: 0.6000 mg | ORAL_TABLET | Freq: Every day | ORAL | 0 refills | Status: DC

## 2017-03-24 MED ORDER — NAPROXEN 500 MG PO TABS: 500.0000 mg | ORAL_TABLET | Freq: Two times a day (BID) | ORAL | 0 refills | Status: AC

## 2017-03-24 MED ORDER — BIMATOPROST 0.01 % OP SOLN: 1.0000 [drp] | Freq: Every day | OPHTHALMIC | 0 refills | Status: DC

## 2017-03-24 MED ORDER — DORZOLAMIDE HCL-TIMOLOL MAL 2-0.5 % OP SOLN: 1.0000 [drp] | Freq: Two times a day (BID) | OPHTHALMIC | 0 refills | Status: DC

## 2017-03-24 NOTE — Discharge Summary (Signed)
Physician Discharge Summary  Martin [name] ZOX:[id number] DOB: 1956/10/08  PCP: None  Admit date: 03/21/2017 Discharge date: 03/24/2017  Recommendations for Outpatient Follow-up:  1. [facility]/PC[facility] on 03/29/17 at 1:30 PM. To be seen with repeat labs (CBC & BMP). Recommend repeating TSH in 4 weeks. 2. Dr. Jones Broom, Orthopedics, as needed if left shoulder does not improve after conservative treatment.  Home Health: PT and social work Equipment/Devices: None    Discharge Condition: Improved and stable  CODE STATUS: Full  Diet recommendation: Regular diet  Discharge Diagnoses:  Principal Problem:   Adjustment disorder with mixed anxiety and depressed mood Active Problems:   Chronic pain syndrome   Legally blind   Deaf   Hypothyroidism   Swelling of left hand   Constipation   Protein-calorie malnutrition, severe   Lethargy   Acute encephalopathy   FTT (failure to thrive) in adult   Brief Summary: 61 year old married male, PMH of deafness, legally blind, ongoing significant social issues (has been given 30 day notice for eviction from current home), hypothyroid, Ogilvie's syndrome, gout, septic arthritis of right knee, chronic pain, ongoing failure to thrive, suspected medical noncompliance, has not followed with PCP for some time, chronically ambulates with the help of crutches, during a routine visit by member of a nonprofit organization on day of admission noted that he was significantly altered compared to 2 weeks prior (not speaking or interacting, ambulating with help of his crutches but mostly looking at floor) and left upper extremity weakness following which EMS was called and patient was brought to ED for further evaluation. Seen in the ED on 03/08/17 for left shoulder pain, x-rays had shown evidence of OA and was discharged on pain medications.   Assessment & Plan:   1. Altered mental status/less responsive: Triggered admission to  hospital. Unclear etiology. Resolved and mental status back to baseline. CT head without acute findings. No obvious infectious etiology identified (chest x-ray without acute findings and urine microscopy negative). Blood work significant for TSH 42.49 (was 72.5 in April 2017) which could contribute. Also seems to have underlying psychiatric issues, see below. UDS negative. No recurrence noted. 2. Hypothyroid: TSH 42.49. Suspect noncompliance. Resumed prior home dose of Synthroid. Will need repeat TSH in 4 weeks. Outpatient compliance needs to be ensured and may be difficult given social issues. Patient and spouse were counseled extensively via interpreter regarding compliance with all aspects of medical care and they verbalized understanding. 3. Psychiatric/paranoia: Psychiatry consultation from 6/13 appreciated and no significant recommendations made. 4. Left upper extremity weakness/shoulder pain: Reported left upper extremity weakness noted by member of nonprofit organization on 6/12. Patient does report chronic pain in the left shoulder. He is left-handed. He chronically uses crutches to ambulate. Neurology consultation 6/13 appreciated and feels that his LUE movement restrictions are mostly related to left shoulder pain but cannot definitely rule out neurological causes and recommended MRI of C-spine and shoulder to evaluate. MRI C-spine results were discussed with Neurosurgeon on call who advised that patient has cervical osteoarthritis with possible radiculopathy but no indications for acute surgical intervention and may consider outpatient EMG/NCS to further evaluate. 5. Left shoulder pain: X-ray 03/08/17 showed no acute findings but mild glenohumeral joint osteoarthritis. Patient underwent MRI of left shoulder. Orthopedics consultation appreciated > AC joint arthritis by exam and MRI. Also concern about adhesive capsulitis given his reported disuse but he has good range of motion. Recommended scheduled  NSAIDs/Naproxen 500 MG bid for 4-6 weeks, PT and  if this doesn't result in significant improvement then steroid joint injection followed by surgical intervention. Outpatient follow-up with orthopedics as needed. 6. Left knee pain: No acute findings on local exam except painful range of movements and mild tenderness. Low index of suspicion for septic arthritis in the absence of fevers or other local findings. ? OA. Outpatient follow-up with orthopedics. 7. Stage II chronic kidney disease: Creatinine appears to be at baseline. Creatinine has normalized. 8. Pancytopenia: Unclear etiology.? Iron deficiency. B12 and folate okay. Consider iron supplements as outpatient, may make GI issue/Ogilvie's worse. Consider outpatient hematology consultation and workup. HIV antibody nonreactive. Stable. 9. Ogilvie's: Chronic abdominal distention without acute illness. Having BMs. Bowel regimen as outpatient. 10. Severe protein calorie malnutrition: Dietitian consulted. 11. Social issues: Recently received eviction notice. Clinical social worker followed-up. As per discussion today, patient first wanted to visit ALF before making decision and can follow-up with outpatient social work. Taxi vouchers were given to patient/family. 12. Adult failure to thrive: Multifactorial due to social issues and multiple medical/psychiatric illness. 13. History of gout: No acute flare noted. 14. Asymptomatic sinus bradycardia: Possibly due to untreated hypothyroid. Continue Synthroid.    Consultants:  Psychiatry Neurology  Orthopedics  Procedures:  None   Discharge Instructions  Discharge Instructions    Call MD for:    Complete by:  As directed    Recurrent confusion.   Call MD for:  extreme fatigue    Complete by:  As directed    Call MD for:  persistant dizziness or light-headedness    Complete by:  As directed    Call MD for:  severe uncontrolled pain    Complete by:  As directed    Diet general    Complete  by:  As directed    Increase activity slowly    Complete by:  As directed        Medication List    STOP taking these medications   doxycycline 100 MG capsule Commonly known as:  VIBRAMYCIN   gabapentin 300 MG capsule Commonly known as:  NEURONTIN   HYDROcodone-acetaminophen 5-325 MG tablet Commonly known as:  NORCO/VICODIN   levofloxacin 750 MG tablet Commonly known as:  LEVAQUIN   ondansetron 4 MG tablet Commonly known as:  ZOFRAN   oxyCODONE-acetaminophen 5-325 MG tablet Commonly known as:  PERCOCET/ROXICET     TAKE these medications   bimatoprost 0.01 % Soln Commonly known as:  LUMIGAN Place 1 drop into both eyes at bedtime.   colchicine 0.6 MG tablet Take 1 tablet (0.6 mg total) by mouth daily.   dorzolamide-timolol 22.3-6.8 MG/ML ophthalmic solution Commonly known as:  COSOPT Place 1 drop into both eyes 2 (two) times daily. What changed:  medication strength  how to take this   levothyroxine 125 MCG tablet Commonly known as:  SYNTHROID, LEVOTHROID Take 1 tablet (125 mcg total) by mouth daily before breakfast.   multivitamin with minerals Tabs tablet Take 1 tablet by mouth daily.   naproxen 500 MG tablet Commonly known as:  NAPROSYN Take 1 tablet (500 mg total) by mouth 2 (two) times daily with a meal.   polyethylene glycol packet Commonly known as:  MIRALAX Take 17 g by mouth daily.   senna 8.6 MG Tabs tablet Commonly known as:  SENOKOT Take 1 tablet (8.6 mg total) by mouth 2 (two) times daily as needed for mild constipation.      Follow-up Information    [facility]-Home Follow up.   Why:  A  representative from [facility] will contact you to arrange start date and time for your therapy. Contact information: [address] [place] [phone]        [facility]. Go on 03/29/2017.   Why:  at 1:30pm for a hospital follow up appointment.  To be seen with repeat  labs (CBC & BMP). Recommend repeating TSH in 4 weeks. Contact information: [address] [place] [phone]       Jones Broomhandler, Justin, MD Follow up.   Specialty:  Orthopedic Surgery Why:  As needed if pain doesn't improve after physical therapy. Contact information: [address] [place] [address] [phone]          No Known Allergies    Procedures/Studies: Ct Head Wo Contrast  Result Date: 03/21/2017 CLINICAL DATA:  Increasing lethargy and decreased level of consciousness over the past few days. EXAM: CT HEAD WITHOUT CONTRAST TECHNIQUE: Contiguous axial images were obtained from the base of the skull through the vertex without intravenous contrast. COMPARISON:  None. FINDINGS: Brain: Appears normal without hemorrhage, infarct, mass lesion, mass effect, midline shift or abnormal extra-axial fluid collection. No hydrocephalus or pneumocephalus. Vascular: Mild atherosclerosis noted. Skull: Negative. Sinuses/Orbits: Negative. Other: Degenerative disease about the TMJs is seen bilaterally. IMPRESSION: No acute intracranial abnormality. Degenerative disease about the TMJs bilaterally. Electronically Signed   By: Drusilla Kannerhomas  Dalessio M.D.   On: 03/21/2017 12:52   Mr Cervical Spine Wo Contrast  Result Date: 03/23/2017 CLINICAL DATA:  Initial evaluation for left shoulder pain. EXAM: MRI CERVICAL SPINE WITHOUT CONTRAST TECHNIQUE: Multiplanar, multisequence MR imaging of the cervical spine was performed. No intravenous contrast was administered. COMPARISON:  None available. FINDINGS: Alignment: Reversal of the normal cervical lordosis with apex at C4-5. No listhesis. Vertebrae: Vertebral body heights are maintained. No evidence for acute or chronic fracture. Signal intensity within the vertebral body bone marrow is normal. No discrete or worrisome osseous lesions. No abnormal marrow edema. Cord: Signal intensity within the cervical spinal cord  is normal. Posterior Fossa, vertebral arteries, paraspinal tissues: Visualized brain and posterior fossa are normal. Craniocervical junction normal. Paraspinous and prevertebral soft tissues are normal. Normal intravascular flow voids present within the vertebral arteries bilaterally. 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 No disc bulge. Mild right-sided uncovertebral hypertrophy. No significant canal or foraminal stenosis. C3-C4: Mild degenerative disc bulge. Right worse than left uncovertebral hypertrophy. Bulging disc flattens the ventral thecal sac with resultant mild canal stenosis. No significant cord flattening or cord signal changes. Superimposed bilateral facet degeneration. Resultant severe right with moderate left C4 foraminal stenosis. C4-C5: Diffuse circumferential degenerative disc osteophyte with intervertebral disc space narrowing. Broad posterior component flattens and partially faces the ventral thecal sac. Mild canal stenosis. Mild to moderate bilateral C5 foraminal stenosis, slightly worse on the left. C5-C6: Chronic diffuse degenerative disc osteophyte with intervertebral disc space narrowing. Broad posterior component flattens and partially faces the ventral thecal sac. Mild canal stenosis. Mild to moderate bilateral C6 foraminal stenosis. C6-C7: Chronic diffuse degenerative disc osteophyte with intervertebral disc space narrowing. Broad posterior component flattens and partially faces the ventral thecal sac. Mild canal stenosis. Severe left with moderate right C7 foraminal stenosis. C7-T1: Minimal disc bulge with right worse than left uncovertebral hypertrophy. No significant canal stenosis. Mild bilateral foraminal narrowing, greater on the right. Visualized upper thoracic spine within normal limits. IMPRESSION: 1. 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 2. Multifactorial degenerative changes with resultant multilevel foraminal narrowing as above. Most notable findings include severe right with moderate left C4 foraminal stenosis, mild-to-moderate bilateral C5 and C6 foraminal narrowing, with severe left and moderate right C7 foraminal stenosis. Electronically Signed   By: Rise Mu M.D.   On: 03/23/2017 22:28   Mr Shoulder Left Wo Contrast  Result Date: 03/24/2017 CLINICAL DATA:  Chronic left upper extremity pain and weakness. No known injury. EXAM: MRI OF THE LEFT SHOULDER WITHOUT CONTRAST TECHNIQUE: Multiplanar, multisequence MR imaging of the shoulder was performed. No intravenous contrast was administered. COMPARISON:  Plain films left shoulder 03/08/2017. FINDINGS: Rotator cuff:  Mild rotator cuff tendinopathy without tear is seen. Muscles:  No atrophy or focal lesion. Biceps long head:  Intact. Acromioclavicular Joint: Moderately severe degenerative change is present with intense marrow edema about the joint. Type 1 acromion. No subacromial/subdeltoid bursal fluid. Glenohumeral Joint: Negative. Labrum:  The superior labrum is degenerated.  No focal tear. Bones:  No fracture or worrisome lesion. Other: There is edema in intermuscular fatty tissues throughout. No focal fluid collection. IMPRESSION: Dominant finding is moderately severe acromioclavicular osteoarthritis with marked marrow edema about the joint. Mild rotator cuff tendinopathy without tear. Edema in deep fatty tissues between muscles is nonspecific but may be due to volume overload. No focal fluid collection. Electronically Signed   By: Drusilla Kanner M.D.   On: 03/24/2017 09:55   Dg Abd Acute W/chest  Result Date: 03/21/2017 CLINICAL DATA:  Ogilvie's syndrome.  Altered mental status. EXAM: DG ABDOMEN ACUTE W/ 1V CHEST COMPARISON:  01/04/2017 CT abdomen/ pelvis and abdominal radiograph. FINDINGS: Low lung volumes. Top-normal heart size. Otherwise  normal mediastinal contour. No pneumothorax. No pleural effusion. Stable chronic elevation of the left hemidiaphragm. No overt pulmonary edema. No acute consolidative airspace disease. Marked diffuse gaseous distention of the large bowel with moderate colonic stool volume, a chronic finding, not appreciably changed since 01/04/2017. No disproportionately dilated small bowel loops or air-fluid levels. No evidence of pneumatosis or pneumoperitoneum. Mild thoracolumbar spondylosis. IMPRESSION: 1. No evidence of small-bowel obstruction . 2. Chronic marked diffuse gaseous distention of the large bowel, compatible with Ogilvie's Syndrome . 3. Associated chronic elevation of left hemidiaphragm. Low lung volumes. No acute cardiopulmonary disease. Electronically Signed   By: Delbert Phenix M.D.   On: 03/21/2017 12:42      Subjective: Ongoing chronic left shoulder pain-no worse. Otherwise stated that he was "okay". Patient interviewed using sign language interpreter at bedside. No other complaints reported.  Discharge Exam:  Vitals:   03/23/17 2227 03/24/17 0525 03/24/17 0832 03/24/17 1439  BP: 125/74 105/68 107/69 99/67  Pulse: (!) 51 (!) 56 (!) 59 (!) 57  Resp: 18 18  16   Temp: 98.7 F (37.1 C) 98 F (36.7 C)  97.9 F (36.6 C)  TempSrc: Oral Oral  Oral  SpO2: 97% 100%  99%  Weight:      Height:        General exam: Middle-aged male, small built, cachectic, extremely frail and chronically ill-looking lying comfortably propped up in bed. Respiratory system: Clear to auscultation. Respiratory effort normal.  Cardiovascular system: S1 & S2 heard, RRR. No JVD, murmurs, rubs, gallops or clicks. No pedal edema. Telemetry: Sinus bradycardia in the 40s/50s without pauses. Gastrointestinal system: Abdomen is distended, tympanitic, soft and nontender. No organomegaly or masses felt. Diminished bowel sounds heard.  Central nervous system: Alert and  oriented 2. No cranial nerve deficits except known legal  blindness.  Extremities: Grade 5 x 5 power in right upper extremity, grade 4 x 5 power in right lower extremity. At least grade 4 x 5 power in left lower extremity-limited by left knee pain. Left upper extremity wasting and multiple muscle compartments especially in the hands and forearm. At least grade 4 x 5 power but limited assessment due to left shoulder pain. No acute findings on left shoulder and left knee exam.  Skin: No rashes, lesions or ulcers Psychiatry: Judgement and insight impaired. Mood & affect appropriate.    The results of significant diagnostics from this hospitalization (including imaging, microbiology, ancillary and laboratory) are listed below for reference.       Labs: CBC:  Recent Labs Lab 03/21/17 1144 03/23/17 0603 03/24/17 0623  WBC 3.0* 3.3* 3.5*  NEUTROABS 1.5* 1.7  --   HGB 10.8* 10.0* 10.6*  HCT 33.5* 30.4* 32.3*  MCV 93.1 93.0 91.0  PLT 121* PLATELET CLUMPS NOTED ON SMEAR, COUNT APPEARS DECREASED 135*   Basic Metabolic Panel:  Recent Labs Lab 03/21/17 1144 03/23/17 0603  NA 133* 134*  K 3.9 3.9  CL 99* 109  CO2 27 19*  GLUCOSE 93 101*  BUN 15 13  CREATININE 1.44* 1.13  CALCIUM 9.3 8.1*   Liver Function Tests:  Recent Labs Lab 03/21/17 1144  AST 94*  ALT 57  ALKPHOS 65  BILITOT 0.7  PROT 7.7  ALBUMIN 3.8    Thyroid function studies  Recent Labs  03/21/17 1806  TSH 42.491*   Anemia work up  Recent Labs  03/21/17 1803 03/21/17 1806  VITAMINB12 599  --   FOLATE  --  17.7  FERRITIN 136  --   TIBC 206*  --   IRON 41*  --   RETICCTPCT 0.7  --    Urinalysis    Component Value Date/Time   COLORURINE YELLOW 03/21/2017 1422   APPEARANCEUR CLEAR 03/21/2017 1422   LABSPEC 1.009 03/21/2017 1422   PHURINE 7.0 03/21/2017 1422   GLUCOSEU NEGATIVE 03/21/2017 1422   HGBUR NEGATIVE 03/21/2017 1422   BILIRUBINUR NEGATIVE 03/21/2017 1422   KETONESUR NEGATIVE 03/21/2017 1422   PROTEINUR NEGATIVE 03/21/2017 1422   NITRITE  NEGATIVE 03/21/2017 1422   LEUKOCYTESUR NEGATIVE 03/21/2017 1422    Discussed with patient's spouse at bedside. Updated care and answered questions.  Time coordinating discharge: Over 30 minutes  SIGNED:  Marcellus Scott, MD, FACP, FHM. [facility] Pager [phone]  If 7PM-7AM, please contact night-coverage [email or web address] Password [email or web address] 03/24/2017, 2:54 PM

## 2017-03-24 NOTE — Telephone Encounter (Signed)
Call received from Vance PeperSusan Brady, RN CM requesting a [facility],  An appointment was scheduled for 03/29/17 @ 1330 and the information was placed on the AVS.

## 2017-03-24 NOTE — Telephone Encounter (Signed)
Call received from Wyatt HasteLorraine Ajel, RN/THN noting that a referral can be made to [facility] for [facility] Management if it is deemed appropriate for chronic health management after the patient is seen at his appointment on 03/29/17.

## 2017-03-24 NOTE — Consult Note (Signed)
           [facility] CM Primary Care Navigator  03/24/2017  Darletta MollLarry [name] [birth year] [id number]   Went to see patient at the bedside to identify possible discharge needs. Patient was admitted due to altered mental status. Patient is deaf and almost blind. His companion (also deaf) was trying to assist in conveying information to him. A physician was in and attempted to use video aid- teleinterpreter but patient said he could not see the screen well enough.  Was unable to properly assess discharge needs of patient, however, Inpatient CM note states that patient was agreeable to go home with home health services and will work with a Child psychotherapistsocial worker on getting into an assisted living facility.  Inpatient CM has placed a call to Laural BenesJane B. with [facility] to get assistance in having patient established with a primary care provider since he has not been seen by a PCP in over the last year. An appointment was scheduled for 03/29/17 at 1330 for a post hospital follow-up.  Call was placed to Laural BenesJane B. [facility]) and made her aware to refer [facility] to [facility] care management for any health management needs, issues and concerns that patient can be deemed appropriate for during his follow-up visit. Erskine SquibbJane was agreeable and will inform provider as stated.    For questions, please contact:  Wyatt HasteLorraine Bhargav Barbaro, BSN, RN- [facility] Primary Care Navigator  Telephone: [phone] [facility]

## 2017-03-24 NOTE — Consult Note (Signed)
Reason for Consult:Left shoulder pain Referring Physician: A Hongalgi  Martin [name] is an 61 y.o. male.  HPI: Martin [name] is a deaf and mostly blind pt with multiple medical problems. He was admitted for other reasons but while here complained of left shoulder pain of a long duration. He has had some x-rays in the past but has a history of non-compliance so I don't think he followed up with a provider or with recommendations. Primary service here obtained MR of c-spine and shoulder which showed some degenerative changes in neck as well as significant AC joint arthritis. Orthopedic surgery was asked to see. I attempted to conduct visit with aid of ASL teleinterpreter but pt said he couldn't see screen well enough. He was able to mimic movements and allowed me to conduct exam. I wrote findings and plan and his companion (also deaf) was able to convey that to him.  Past Medical History:  Diagnosis Date  . Chronic constipation   . Deaf   . Gout   . Hypothyroidism   . Legally blind   . Ogilvie's syndrome   . Pyogenic arthritis of right knee joint [facility])     Past Surgical History:  Procedure Laterality Date  . EYE SURGERY Right    "relieved fluid pressure and cleaned it out"  . KNEE ARTHROSCOPY Right 12/22/2015   Procedure: ARTHROSCOPY WASHOUT OF SEPTIC RIGHT KNEE;  Surgeon: Renette Butters, MD;  Location: [place];  Service: Orthopedics;  Laterality: Right;  . KNEE SURGERY Left 2015/2016    Family History  Problem Relation Age of Onset  . Family history unknown: Yes    Social History:  reports that he has never smoked. He has never used smokeless tobacco. He reports that he does not drink alcohol or use drugs.  Allergies: No Known Allergies  Medications: I have reviewed the patient's current medications.  Results for orders placed or performed during the hospital encounter of 03/21/17 (from the past 48 hour(s))  CBC with Differential/Platelet     Status: Abnormal   Collection Time: 03/23/17   6:03 AM  Result Value Ref Range   WBC 3.3 (L) 4.0 - 10.5 K/uL   RBC 3.27 (L) 4.22 - 5.81 MIL/uL   Hemoglobin 10.0 (L) 13.0 - 17.0 g/dL   HCT 30.4 (L) 39.0 - 52.0 %   MCV 93.0 78.0 - 100.0 fL   MCH 30.6 26.0 - 34.0 pg   MCHC 32.9 30.0 - 36.0 g/dL   RDW 15.3 11.5 - 15.5 %   Platelets  150 - 400 K/uL    PLATELET CLUMPS NOTED ON SMEAR, COUNT APPEARS DECREASED   Neutrophils Relative % 48 %   Lymphocytes Relative 40 %   Monocytes Relative 7 %   Eosinophils Relative 4 %   Basophils Relative 1 %   Neutro Abs 1.7 1.7 - 7.7 K/uL   Lymphs Abs 1.3 0.7 - 4.0 K/uL   Monocytes Absolute 0.2 0.1 - 1.0 K/uL   Eosinophils Absolute 0.1 0.0 - 0.7 K/uL   Basophils Absolute 0.0 0.0 - 0.1 K/uL   RBC Morphology BURR CELLS   Basic metabolic panel     Status: Abnormal   Collection Time: 03/23/17  6:03 AM  Result Value Ref Range   Sodium 134 (L) 135 - 145 mmol/L   Potassium 3.9 3.5 - 5.1 mmol/L   Chloride 109 101 - 111 mmol/L   CO2 19 (L) 22 - 32 mmol/L   Glucose, Bld 101 (H) 65 - 99 mg/dL  BUN 13 6 - 20 mg/dL   Creatinine, Ser 3.61 0.61 - 1.24 mg/dL   Calcium 8.1 (L) 8.9 - 10.3 mg/dL   GFR calc non Af Amer >60 >60 mL/min   GFR calc Af Amer >60 >60 mL/min    Comment: (NOTE) The eGFR has been calculated using the CKD EPI equation. This calculation has not been validated in all clinical situations. eGFR's persistently <60 mL/min signify possible Chronic Kidney Disease.    Anion gap 6 5 - 15  CBC     Status: Abnormal   Collection Time: 03/24/17  6:23 AM  Result Value Ref Range   WBC 3.5 (L) 4.0 - 10.5 K/uL   RBC 3.55 (L) 4.22 - 5.81 MIL/uL   Hemoglobin 10.6 (L) 13.0 - 17.0 g/dL   HCT 25.5 (L) 57.8 - 30.5 %   MCV 91.0 78.0 - 100.0 fL   MCH 29.9 26.0 - 34.0 pg   MCHC 32.8 30.0 - 36.0 g/dL   RDW 28.1 88.5 - 77.1 %   Platelets 135 (L) 150 - 400 K/uL    Mr Cervical Spine Wo Contrast  Result Date: 03/23/2017 CLINICAL DATA:  Initial evaluation for left shoulder pain. EXAM: MRI CERVICAL SPINE  WITHOUT CONTRAST TECHNIQUE: Multiplanar, multisequence MR imaging of the cervical spine was performed. No intravenous contrast was administered. COMPARISON:  None available. FINDINGS: Alignment: Reversal of the normal cervical lordosis with apex at C4-5. No listhesis. Vertebrae: Vertebral body heights are maintained. No evidence for acute or chronic fracture. Signal intensity within the vertebral body bone marrow is normal. No discrete or worrisome osseous lesions. No abnormal marrow edema. Cord: Signal intensity within the cervical spinal cord is normal. Posterior Fossa, vertebral arteries, paraspinal tissues: Visualized brain and posterior fossa are normal. Craniocervical junction normal. Paraspinous and prevertebral soft tissues are normal. Normal intravascular flow voids present within the vertebral arteries bilaterally. 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 No disc bulge. Mild right-sided uncovertebral hypertrophy. No significant canal or foraminal stenosis. C3-C4: Mild degenerative disc bulge. Right worse than left uncovertebral hypertrophy. Bulging disc flattens the ventral thecal sac with resultant mild canal stenosis. No significant cord flattening or cord signal changes. Superimposed bilateral facet degeneration. Resultant severe right with moderate left C4 foraminal stenosis. C4-C5: Diffuse circumferential degenerative disc osteophyte with intervertebral disc space narrowing. Broad posterior component flattens and partially faces the ventral thecal sac. Mild canal stenosis. Mild to moderate bilateral C5 foraminal stenosis, slightly worse on the left. C5-C6: Chronic diffuse degenerative disc osteophyte with intervertebral disc space narrowing. Broad posterior component flattens and partially faces the ventral thecal sac. Mild canal stenosis. Mild to moderate bilateral C6 foraminal stenosis. C6-C7: Chronic diffuse degenerative disc osteophyte with intervertebral disc  space narrowing. Broad posterior component flattens and partially faces the ventral thecal sac. Mild canal stenosis. Severe left with moderate right C7 foraminal stenosis. C7-T1: Minimal disc bulge with right worse than left uncovertebral hypertrophy. No significant canal stenosis. Mild bilateral foraminal narrowing, greater on the right. Visualized upper thoracic spine within normal limits. IMPRESSION: 1. 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 2. Multifactorial degenerative changes with resultant multilevel foraminal narrowing as above. Most notable findings include severe right with moderate left C4 foraminal stenosis, mild-to-moderate bilateral C5 and C6 foraminal narrowing, with severe left and moderate right C7 foraminal stenosis. Electronically Signed   By: Rise Mu M.D.   On:  03/23/2017 22:28   Mr Shoulder Left Wo Contrast  Result Date: 03/24/2017 CLINICAL DATA:  Chronic left upper extremity pain and weakness. No known injury. EXAM: MRI OF THE LEFT SHOULDER WITHOUT CONTRAST TECHNIQUE: Multiplanar, multisequence MR imaging of the shoulder was performed. No intravenous contrast was administered. COMPARISON:  Plain films left shoulder 03/08/2017. FINDINGS: Rotator cuff:  Mild rotator cuff tendinopathy without tear is seen. Muscles:  No atrophy or focal lesion. Biceps long head:  Intact. Acromioclavicular Joint: Moderately severe degenerative change is present with intense marrow edema about the joint. Type 1 acromion. No subacromial/subdeltoid bursal fluid. Glenohumeral Joint: Negative. Labrum:  The superior labrum is degenerated.  No focal tear. Bones:  No fracture or worrisome lesion. Other: There is edema in intermuscular fatty tissues throughout. No focal fluid collection. IMPRESSION: Dominant finding is moderately severe acromioclavicular osteoarthritis with  marked marrow edema about the joint. Mild rotator cuff tendinopathy without tear. Edema in deep fatty tissues between muscles is nonspecific but may be due to volume overload. No focal fluid collection. Electronically Signed   By: Inge Rise M.D.   On: 03/24/2017 09:55    Review of Systems  Unable to perform ROS: Patient nonverbal  Musculoskeletal: Positive for joint pain (Shoulder pain).   Blood pressure 107/69, pulse (!) 59, temperature 98 F (36.7 C), temperature source Oral, resp. rate 18, height '5\' 6"'$  (1.676 m), weight 50.4 kg (111 lb 1.6 oz), SpO2 100 %. Physical Exam  Constitutional: He appears well-developed and well-nourished.  HENT:  Head: Normocephalic.  Eyes: Conjunctivae are normal. Right eye exhibits no discharge. Left eye exhibits no discharge. No scleral icterus.  Cardiovascular: Normal rate and regular rhythm.   Respiratory: Effort normal.  Musculoskeletal:  Right shoulder, elbow, wrist, digits- no skin wounds, nontender, no instability, no blocks to motion  Sens  Ax/R/M/U intact  Mot   Ax/ R/ PIN/ M/ AIN/ U intact  Rad 2+  Left shoulder, elbow, wrist, digits- no skin wounds, shoulder severe TTP, no instability, no blocks to motion, very good AROM. Cross-body adduction test positive.  Sens  Ax/R/M/U could not assess  Rad 2+   Neurological: He is alert.  Skin: Skin is warm and dry.  Psychiatric: He has a normal mood and affect. His behavior is normal.    Assessment/Plan: Left shoulder pain -- AC joint arthritis by exam and MRI. I'm not sure this explains his more general shoulder pain but is definitely a component. I was worried about adhesive capsulitis given his reported disuse but he has good range of motion. I can't rule out a neuropathic component stemming from his c-spine degeneration but neurology seems convinced this is not the cause. Treatment should begin with a trial of NSAID's (Naproxen '500mg'$  bid) and physical therapy. If this doesn't result in  significant improvement then joint injection would be indicated. If he gets to this point I would have him follow up with Dr. Tamera Punt for evaluation.    Lisette Abu, PA-C Orthopedic Surgery [phone] 03/24/2017, 2:07 PM

## 2017-03-24 NOTE — Progress Notes (Signed)
CSW provided ALF list to patient for them to visit and decide on future placement. CSW will provide taxi voucher for patient's wife if she is unable to get home.   CSW signing off.  Osborne Cascoadia Bellah Alia LCSWA [phone]

## 2017-03-24 NOTE — Progress Notes (Signed)
Darletta MollLarry Rago to be D/C'd to home with home health PT per MD order. Nurse checked with CM and SW to determine patient's readiness for discharge.  Discussed with the patient and all questions fully answered with ASL interpreter present at bedside.  VSS, Skin clean, dry and intact without evidence of skin break down, no evidence of skin tears noted. IV catheter discontinued intact. Site without signs and symptoms of complications. Dressing and pressure applied.  An After Visit Summary was printed and given to the patient. Prescriptions sent to patient pharmacy.  D/c education completed with patient/family including follow up instructions, medication list, d/c activities limitations if indicated, with other d/c instructions as indicated by MD - patient able to verbalize understanding, all questions fully answered.   Patient instructed to return to ED, call 911, or call MD for any changes in condition.   Patient escorted via WC, and D/C home via taxi cab.  Trayden Brandy L Price 03/24/2017 4:20 PM

## 2017-03-24 NOTE — Care Management Note (Addendum)
Case Management Note  Patient Details  Name: Martin [name] MRN: [id number] Date of Birth: July 03, 1956  Subjective/Objective:  61 yr old gentleman admitted with decreased level of consciousness.                       Action/Plan: Case manager was requested to see patient for assistance with medication and getting setup with PCP. Patient has medicare and medicaid so he will not receive MATCH, his medications are reasonable. CM and Social Worker Osborne Cascoadia spoke with patient via interpreter concerning discharge options. Patient agreeable to go home with Home Health and will work with a Child psychotherapistsocial worker on getting into an assisted living facility. Patient wants to visit some facilities first. CM called referral for HH to Janeice RobinsonKaren Nusbaumm, [facility]. Patient has no DME needs. CM has placed call to Erskine SquibbJane with [facility] to get assistance with getting patient established .  Expected Discharge Date:    03/24/17              Expected Discharge Plan:  Home w Home Health Services  In-House Referral:  Clinical Social Work  Discharge planning Services  CM Consult, Follow-up appt scheduled, [facility]  Post Acute Care Choice:  Home Health Choice offered to:  Patient (choice offered through interpreter)  DME Arranged:  N/A DME Agency:  NA  HH Arranged:  PT, Social Work [facility]  [facility]  Status of Service:  In process, will continue to follow  If discussed at Long Length of Stay Meetings, dates discussed:    Additional Comments:Jane Brazeau, CM with [facility] has secured appt for patient on Wednesday, March 29, 2017 at 1:30pm. CM has informed nurse, appt is on AVS.    Durenda GuthrieBrady, Deshannon Seide Naomi, RN 03/24/2017, 12:27 PM

## 2017-03-29 ENCOUNTER — Inpatient Hospital Stay: Payer: Self-pay

## 2017-04-03 ENCOUNTER — Telehealth: Payer: Self-pay | Admitting: Internal Medicine

## 2017-04-03 NOTE — Telephone Encounter (Signed)
Contacted peggy and lm regarding [employer] information Pt pickup time is between 1031-1101 and pt return is [phone]

## 2017-04-03 NOTE — Telephone Encounter (Signed)
Good Morning   Patient has a HFU on Wednesday 04-05-17 @ 11:30AM and Medicaid transportation require 72 hours in advance and Mrs Page is asking if the clinic can do  A letter saying that the patient need to come to the appt  and sent it to [employer] transportation  Fax# [phone] after that call Peggy   [phone]. Thank You

## 2017-04-05 ENCOUNTER — Ambulatory Visit: Payer: Medicare Other | Attending: Internal Medicine | Admitting: Physician Assistant

## 2017-04-05 VITALS — BP 104/68 | HR 66 | Temp 97.6°F | Resp 16 | Wt 109.0 lb

## 2017-04-05 DIAGNOSIS — R4182 Altered mental status, unspecified: Secondary | ICD-10-CM | POA: Diagnosis not present

## 2017-04-05 DIAGNOSIS — N183 Chronic kidney disease, stage 3 unspecified: Secondary | ICD-10-CM

## 2017-04-05 DIAGNOSIS — R627 Adult failure to thrive: Secondary | ICD-10-CM | POA: Insufficient documentation

## 2017-04-05 DIAGNOSIS — D649 Anemia, unspecified: Secondary | ICD-10-CM | POA: Diagnosis not present

## 2017-04-05 DIAGNOSIS — E669 Obesity, unspecified: Secondary | ICD-10-CM | POA: Insufficient documentation

## 2017-04-05 DIAGNOSIS — G8929 Other chronic pain: Secondary | ICD-10-CM

## 2017-04-05 DIAGNOSIS — H9190 Unspecified hearing loss, unspecified ear: Secondary | ICD-10-CM | POA: Insufficient documentation

## 2017-04-05 DIAGNOSIS — M109 Gout, unspecified: Secondary | ICD-10-CM | POA: Insufficient documentation

## 2017-04-05 DIAGNOSIS — Z79899 Other long term (current) drug therapy: Secondary | ICD-10-CM | POA: Diagnosis not present

## 2017-04-05 DIAGNOSIS — F4320 Adjustment disorder, unspecified: Secondary | ICD-10-CM | POA: Diagnosis not present

## 2017-04-05 DIAGNOSIS — K5909 Other constipation: Secondary | ICD-10-CM | POA: Diagnosis not present

## 2017-04-05 DIAGNOSIS — F329 Major depressive disorder, single episode, unspecified: Secondary | ICD-10-CM | POA: Insufficient documentation

## 2017-04-05 DIAGNOSIS — Z9889 Other specified postprocedural states: Secondary | ICD-10-CM | POA: Insufficient documentation

## 2017-04-05 DIAGNOSIS — E039 Hypothyroidism, unspecified: Secondary | ICD-10-CM | POA: Diagnosis not present

## 2017-04-05 DIAGNOSIS — M009 Pyogenic arthritis, unspecified: Secondary | ICD-10-CM | POA: Insufficient documentation

## 2017-04-05 DIAGNOSIS — M25512 Pain in left shoulder: Secondary | ICD-10-CM | POA: Insufficient documentation

## 2017-04-05 DIAGNOSIS — G9340 Encephalopathy, unspecified: Secondary | ICD-10-CM | POA: Diagnosis not present

## 2017-04-05 MED ORDER — BIMATOPROST 0.01 % OP SOLN: 1.0000 [drp] | Freq: Every day | OPHTHALMIC | 2 refills | Status: DC

## 2017-04-05 MED ORDER — POLYETHYLENE GLYCOL 3350 17 G PO PACK: 17.0000 g | PACK | Freq: Every day | ORAL | 2 refills | Status: DC

## 2017-04-05 MED ORDER — LEVOTHYROXINE SODIUM 125 MCG PO TABS: 125.0000 ug | ORAL_TABLET | Freq: Every day | ORAL | 2 refills | Status: DC

## 2017-04-05 MED ORDER — SENNA 8.6 MG PO TABS: 1.0000 | ORAL_TABLET | Freq: Two times a day (BID) | ORAL | 2 refills | Status: DC | PRN

## 2017-04-05 MED ORDER — DORZOLAMIDE HCL-TIMOLOL MAL 2-0.5 % OP SOLN: 1.0000 [drp] | Freq: Two times a day (BID) | OPHTHALMIC | 2 refills | Status: AC

## 2017-04-05 NOTE — Progress Notes (Signed)
Martin [name]  [id number]  [id number]  DOB - 12-13-1955  Chief Complaint  Patient presents with  . Hospitalization Follow-up       Subjective:   Martin [name] is a 61 y.o. male here today for establishment of care. He has a history of being legally blind, deaf, obese syndrome, gout and hypothyroidism. He also has chronic kidney disease. He was last seen here by Dr. Julien NordmannLangeland April 2017 after a hospitalization for septic arthritis.  He presented to the emergency department on 03/21/2017 with altered mental status and failure to thrive. He admits to being off his medications for 2 months prior. He was weak. His labs were significant for anemia with a hemoglobin of 10, creatinine of 1.4, and low platelets. Imaging studies were unrevealing. His TSH was 42. His heart rate was in the 40s. His systolic blood pressure was okay. He was seen by psychiatry and diagnosed with an adjustment disorder with a depressed mood. He also was seen by neuro who recommended orthopedic follow-up for his left shoulder pain. The orthopedic staff put him on anti-inflammatories and recommended home health physical therapy.  He still complains of the left shoulder pain. His mood is much improved. He is compliant with his medications. He has a sign language interpreter with him today. He is trying to eat a little more but his financial resources won't allow him to do but so much. No chest pain. No shortness of breath. Mentating well. Wife is not present today.  ROS: GEN: denies fever or chills, denies change in weight Skin: denies lesions or rashes HEENT: denies headache, earache, epistaxis, sore throat, or neck pain LUNGS: denies SHOB, dyspnea, PND, orthopnea CV: denies CP or palpitations ABD: denies abd pain, N or V EXT: denies muscle spasms or swelling; + pain in left shoulder ext, no weakness NEURO: denies numbness or tingling, denies sz, stroke or TIA  ALLERGIES: No Known Allergies  PAST MEDICAL  HISTORY: Past Medical History:  Diagnosis Date  . Chronic constipation   . Deaf   . Gout   . Hypothyroidism   . Legally blind   . Ogilvie's syndrome   . Pyogenic arthritis of right knee joint (HCC)     PAST SURGICAL HISTORY: Past Surgical History:  Procedure Laterality Date  . EYE SURGERY Right    "relieved fluid pressure and cleaned it out"  . KNEE ARTHROSCOPY Right 12/22/2015   Procedure: ARTHROSCOPY WASHOUT OF SEPTIC RIGHT KNEE;  Surgeon: Sheral Apleyimothy D Murphy, MD;  [facility];  Service: Orthopedics;  Laterality: Right;  . KNEE SURGERY Left 2015/2016    MEDICATIONS AT HOME: Prior to Admission medications   Medication Sig Start Date End Date Taking? Authorizing Provider  bimatoprost (LUMIGAN) 0.01 % SOLN Place 1 drop into both eyes at bedtime. 04/05/17  Yes Danelle EarthlyNoel, Ronith Berti S, PA-C  colchicine 0.6 MG tablet Take 1 tablet (0.6 mg total) by mouth daily. 03/24/17  Yes Hongalgi, Maximino GreenlandAnand D, MD  dorzolamide-timolol (COSOPT) 22.3-6.8 MG/ML ophthalmic solution Place 1 drop into both eyes 2 (two) times daily. 04/05/17  Yes Danelle EarthlyNoel, Manual Navarra S, PA-C  levothyroxine (SYNTHROID, LEVOTHROID) 125 MCG tablet Take 1 tablet (125 mcg total) by mouth daily before breakfast. 04/05/17  Yes Danelle EarthlyNoel, Daekwon Beswick S, PA-C  Multiple Vitamin (MULTIVITAMIN WITH MINERALS) TABS tablet Take 1 tablet by mouth daily.   Yes [provider]  naproxen (NAPROSYN) 500 MG tablet Take 1 tablet (500 mg total) by mouth 2 (two) times daily with a meal. 03/24/17 03/24/18 Yes Hongalgi, Maximino GreenlandAnand D,  MD  polyethylene glycol (MIRALAX) packet Take 17 g by mouth daily. 04/05/17  Yes Danelle Earthly, Maaz Spiering S, PA-C  senna (SENOKOT) 8.6 MG TABS tablet Take 1 tablet (8.6 mg total) by mouth 2 (two) times daily as needed for mild constipation. 04/05/17  Yes Vivianne Master, PA-C    Family History  Problem Relation Age of Onset  . Family history unknown: Yes   Social-married, no children, cannot work, no smoking  Objective:   Vitals:   04/05/17 1210  BP:  104/68  Pulse: 66  Resp: 16  Temp: 97.6 F (36.4 C)  TempSrc: Oral  SpO2: 97%  Weight: 109 lb (49.4 kg)    Exam-benign General appearance : Awake, alert, not in any distress. Speech Clear. Not toxic looking HEENT: Atraumatic and Normocephalic, pupils equally reactive to light and accomodation Neck: supple, no JVD. No cervical lymphadenopathy.  Chest:Good air entry bilaterally, no added sounds  CVS: S1 S2 regular, no murmurs.  Abdomen: Bowel sounds present, Non tender and not distended with no guarding, rigidity or rebound. Extremities: B/L Lower Ext shows no edema, both legs are warm to touch Neurology: Awake alert, and oriented X 3, CN II-XII intact, Non focal Skin:No Rash Wounds:N/A  Data Review Lab Results  Component Value Date   HGBA1C 5.9 (H) 12/21/2015     Assessment & Plan  1. Encephalopathy-resolved  2. Hypothyroidism, not euthyroid  -Repeat TSH in about 4 weeks  -Synthroid 125 mcg daily, RF available 3. FTT  -given food pantry items/Boost 4. Left shoulder pain  -NSAIDS per Ortho  -will check BMP, if CR up then change NSAID to Tylenol  -make appt with ortho  -f/u HHPT 5. CKD stage 3  -recheck CR 6. Deaf/Legally Blind 7. Anemia/thrombocytopenia  -CBC  F/u 2 weeks with PCP  The patient was given clear instructions to go to ER or return to medical center if symptoms don't improve, worsen or new problems develop. The patient verbalized understanding. The patient was told to call to get lab results if they haven't heard anything in the next week.   Total time spent with patient was 18 min. Greater than 50 % of this visit was spent face to face counseling and coordinating care regarding risk factor modification, compliance importance and encouragement, education related to medication compliance.  This note has been created with Education officer, environmental. Any transcriptional errors are unintentional.    Scot Jun,  PA-C [facility] and [facility] [place], [place] [phone]   04/05/2017, 12:18 PM

## 2017-04-06 ENCOUNTER — Telehealth: Payer: Self-pay | Admitting: Licensed Clinical Social Worker

## 2017-04-06 LAB — CBC WITH DIFFERENTIAL/PLATELET
BASOS: 1 %
Basophils Absolute: 0 10*3/uL (ref 0.0–0.2)
EOS (ABSOLUTE): 0.2 10*3/uL (ref 0.0–0.4)
EOS: 4 %
HEMATOCRIT: 34.3 % — AB (ref 37.5–51.0)
HEMOGLOBIN: 11.2 g/dL — AB (ref 13.0–17.7)
Immature Grans (Abs): 0 10*3/uL (ref 0.0–0.1)
Immature Granulocytes: 0 %
LYMPHS ABS: 1.1 10*3/uL (ref 0.7–3.1)
Lymphs: 22 %
MCH: 30.4 pg (ref 26.6–33.0)
MCHC: 32.7 g/dL (ref 31.5–35.7)
MCV: 93 fL (ref 79–97)
MONOS ABS: 0.2 10*3/uL (ref 0.1–0.9)
Monocytes: 4 %
NEUTROS ABS: 3.4 10*3/uL (ref 1.4–7.0)
Neutrophils: 69 %
Platelets: 162 10*3/uL (ref 150–379)
RBC: 3.68 x10E6/uL — AB (ref 4.14–5.80)
RDW: 16.4 % — AB (ref 12.3–15.4)
WBC: 4.9 10*3/uL (ref 3.4–10.8)

## 2017-04-06 LAB — BASIC METABOLIC PANEL
BUN/Creatinine Ratio: 20 (ref 9–20)
BUN: 25 mg/dL — ABNORMAL HIGH (ref 6–24)
CO2: 23 mmol/L (ref 20–29)
Calcium: 9.8 mg/dL (ref 8.7–10.2)
Chloride: 101 mmol/L (ref 96–106)
Creatinine, Ser: 1.26 mg/dL (ref 0.76–1.27)
GFR calc Af Amer: 72 mL/min/{1.73_m2} (ref >59)
GFR, EST NON AFRICAN AMERICAN: 62 mL/min/{1.73_m2} (ref >59)
Glucose: 99 mg/dL (ref 65–99)
POTASSIUM: 3.9 mmol/L (ref 3.5–5.2)
SODIUM: 141 mmol/L (ref 134–144)

## 2017-04-06 NOTE — Telephone Encounter (Signed)
LCSWA attempted to contact Aileen PilotKelle Owens, Interpretor for [employer], to inform pt of scheduled appointment with PCP. Message left for return call.

## 2017-04-10 ENCOUNTER — Telehealth: Payer: Self-pay | Admitting: Licensed Clinical Social Worker

## 2017-04-10 NOTE — Telephone Encounter (Signed)
LCSWA contacted [employer] and informed staff of upcoming appointment with PCP. Appointment date was placed on calendar.

## 2017-04-24 ENCOUNTER — Ambulatory Visit: Payer: Self-pay | Admitting: Internal Medicine

## 2017-11-20 DIAGNOSIS — H401133 Primary open-angle glaucoma, bilateral, severe stage: Secondary | ICD-10-CM | POA: Diagnosis not present

## 2018-03-06 DIAGNOSIS — H401133 Primary open-angle glaucoma, bilateral, severe stage: Secondary | ICD-10-CM | POA: Diagnosis not present

## 2018-08-03 DIAGNOSIS — H401133 Primary open-angle glaucoma, bilateral, severe stage: Secondary | ICD-10-CM | POA: Diagnosis not present

## 2018-08-15 ENCOUNTER — Other Ambulatory Visit: Payer: Self-pay

## 2018-08-15 ENCOUNTER — Emergency Department (HOSPITAL_COMMUNITY)
Admission: EM | Admit: 2018-08-15 | Discharge: 2018-08-15 | Disposition: A | Discharge status: Home / Self Care | Payer: Medicare Other | Attending: Emergency Medicine | Admitting: Emergency Medicine

## 2018-08-15 ENCOUNTER — Encounter (HOSPITAL_COMMUNITY): Payer: Self-pay

## 2018-08-15 DIAGNOSIS — Z79899 Other long term (current) drug therapy: Secondary | ICD-10-CM | POA: Diagnosis not present

## 2018-08-15 DIAGNOSIS — M79671 Pain in right foot: Secondary | ICD-10-CM

## 2018-08-15 DIAGNOSIS — K5900 Constipation, unspecified: Secondary | ICD-10-CM | POA: Diagnosis not present

## 2018-08-15 DIAGNOSIS — M79672 Pain in left foot: Secondary | ICD-10-CM

## 2018-08-15 DIAGNOSIS — R531 Weakness: Secondary | ICD-10-CM | POA: Diagnosis not present

## 2018-08-15 DIAGNOSIS — M542 Cervicalgia: Secondary | ICD-10-CM | POA: Diagnosis present

## 2018-08-15 DIAGNOSIS — E039 Hypothyroidism, unspecified: Secondary | ICD-10-CM | POA: Diagnosis not present

## 2018-08-15 DIAGNOSIS — R52 Pain, unspecified: Secondary | ICD-10-CM | POA: Diagnosis not present

## 2018-08-15 LAB — COMPREHENSIVE METABOLIC PANEL
ALT: 39 U/L (ref 0–44)
AST: 46 U/L — ABNORMAL HIGH (ref 15–41)
Albumin: 4.6 g/dL (ref 3.5–5.0)
Alkaline Phosphatase: 63 U/L (ref 38–126)
Anion gap: 9 (ref 5–15)
BUN: 19 mg/dL (ref 6–20)
CO2: 29 mmol/L (ref 22–32)
Calcium: 9.8 mg/dL (ref 8.9–10.3)
Chloride: 100 mmol/L (ref 98–111)
Creatinine, Ser: 1.24 mg/dL (ref 0.61–1.24)
GFR calc Af Amer: >60 mL/min (ref >60)
GFR calc non Af Amer: >60 mL/min (ref >60)
Glucose, Bld: 91 mg/dL (ref 70–99)
Potassium: 3.9 mmol/L (ref 3.5–5.1)
Sodium: 138 mmol/L (ref 135–145)
Total Bilirubin: 0.9 mg/dL (ref 0.3–1.2)
Total Protein: 8.8 g/dL — ABNORMAL HIGH (ref 6.5–8.1)

## 2018-08-15 LAB — CBC WITH DIFFERENTIAL/PLATELET
Abs Immature Granulocytes: 0.01 10*3/uL (ref 0.00–0.07)
Basophils Absolute: 0 10*3/uL (ref 0.0–0.1)
Basophils Relative: 1 %
Eosinophils Absolute: 0.1 10*3/uL (ref 0.0–0.5)
Eosinophils Relative: 2 %
HCT: 37.4 % — ABNORMAL LOW (ref 39.0–52.0)
Hemoglobin: 11.4 g/dL — ABNORMAL LOW (ref 13.0–17.0)
Immature Granulocytes: 0 %
Lymphocytes Relative: 40 %
Lymphs Abs: 1.3 10*3/uL (ref 0.7–4.0)
MCH: 29.1 pg (ref 26.0–34.0)
MCHC: 30.5 g/dL (ref 30.0–36.0)
MCV: 95.4 fL (ref 80.0–100.0)
Monocytes Absolute: 0.2 10*3/uL (ref 0.1–1.0)
Monocytes Relative: 6 %
Neutro Abs: 1.6 10*3/uL — ABNORMAL LOW (ref 1.7–7.7)
Neutrophils Relative %: 51 %
Platelets: 129 10*3/uL — ABNORMAL LOW (ref 150–400)
RBC: 3.92 MIL/uL — ABNORMAL LOW (ref 4.22–5.81)
RDW: 15.8 % — ABNORMAL HIGH (ref 11.5–15.5)
WBC: 3.2 10*3/uL — ABNORMAL LOW (ref 4.0–10.5)
nRBC: 0 % (ref 0.0–0.2)

## 2018-08-15 LAB — URINALYSIS, ROUTINE W REFLEX MICROSCOPIC
Bilirubin Urine: NEGATIVE
Glucose, UA: NEGATIVE mg/dL
Hgb urine dipstick: NEGATIVE
Ketones, ur: NEGATIVE mg/dL
Leukocytes, UA: NEGATIVE
Nitrite: NEGATIVE
Protein, ur: NEGATIVE mg/dL
Specific Gravity, Urine: 1.021 (ref 1.005–1.030)
pH: 5 (ref 5.0–8.0)

## 2018-08-15 LAB — LIPASE, BLOOD: Lipase: 50 U/L (ref 11–51)

## 2018-08-15 MED ORDER — POLYETHYLENE GLYCOL 3350 17 G PO PACK: 17.0000 g | PACK | Freq: Every day | ORAL | 0 refills | Status: DC

## 2018-08-15 NOTE — Discharge Instructions (Addendum)
Please read attached information. If you experience any new or worsening signs or symptoms please return to the emergency room for evaluation. Please follow-up with your primary care provider or specialist as discussed. Please use medication prescribed only as directed and discontinue taking if you have any concerning signs or symptoms.   °

## 2018-08-15 NOTE — ED Provider Notes (Signed)
[facility] EMERGENCY DEPARTMENT Provider Note   CSN: [id number] Arrival date & time: 08/15/18  1514     History   Chief Complaint Chief Complaint  Patient presents with  . Leg Pain    HPI Martin [name] is a 62 y.o. male.  HPI   62 year old male presents today for his complaints.  Patient notes he has had pain in his neck, feet, and abdomen chronically.  Patient notes the pain in his neck has been there for 3 weeks and is worse with movement.  He notes pain to the bilateral feet worse on the plantar aspects, this has also been going on for approximately 3 weeks.  He notes chronic left knee pain after a surgery in [place], he is currently being followed by Dr. Deno Etienne for this, no significant changes.  No redness warmth or swelling.  Patient also notes on and off abdominal pain over the last year with intermittent bouts of constipation.  Patient notes he feels slightly constipated presently but notes having a bowel movement this morning.  He denies any fever notes some nausea no vomiting.  Denies any urinary complaints, his wife notes that his urine has been more odorous recently.    Past Medical History:  Diagnosis Date  . Chronic constipation   . Deaf   . Gout   . Hypothyroidism   . Legally blind   . Ogilvie's syndrome   . Pyogenic arthritis of right knee joint [facility])     Patient Active Problem List   Diagnosis Date Noted  . Adjustment disorder with mixed anxiety and depressed mood 03/22/2017  . Lethargy 03/21/2017  . Acute encephalopathy   . FTT (failure to thrive) in adult   . Protein-calorie malnutrition, severe 12/23/2015  . Septic joint (HCC) 12/21/2015  . Chronic pain syndrome 12/21/2015  . Legally blind 12/21/2015  . Deaf 12/21/2015  . Hypothyroidism 12/21/2015  . Swelling of left hand 12/21/2015  . Constipation     Past Surgical History:  Procedure Laterality Date  . EYE SURGERY Right    "relieved fluid pressure and cleaned it out"  . KNEE  ARTHROSCOPY Right 12/22/2015   Procedure: ARTHROSCOPY WASHOUT OF SEPTIC RIGHT KNEE;  Surgeon: Sheral Apley, MD;  Location: [facility] OR;  Service: Orthopedics;  Laterality: Right;  . KNEE SURGERY Left 2015/2016        Home Medications    Prior to Admission medications   Medication Sig Start Date End Date Taking? Authorizing Provider  bimatoprost (LUMIGAN) 0.01 % SOLN Place 1 drop into both eyes at bedtime. 04/05/17   Vivianne Master, PA-C  colchicine 0.6 MG tablet Take 1 tablet (0.6 mg total) by mouth daily. 03/24/17   Hongalgi, Maximino Greenland, MD  dorzolamide-timolol (COSOPT) 22.3-6.8 MG/ML ophthalmic solution Place 1 drop into both eyes 2 (two) times daily. 04/05/17   Vivianne Master, PA-C  levothyroxine (SYNTHROID, LEVOTHROID) 125 MCG tablet Take 1 tablet (125 mcg total) by mouth daily before breakfast. 04/05/17   Vivianne Master, PA-C  Multiple Vitamin (MULTIVITAMIN WITH MINERALS) TABS tablet Take 1 tablet by mouth daily.    [provider]  polyethylene glycol (MIRALAX) packet Take 17 g by mouth daily. 08/15/18   Ronnette Rump, Tinnie Gens, PA-C  senna (SENOKOT) 8.6 MG TABS tablet Take 1 tablet (8.6 mg total) by mouth 2 (two) times daily as needed for mild constipation. 04/05/17   Vivianne Master, PA-C    Family History Family History  Family history unknown: Yes  Social History Social History   Tobacco Use  . Smoking status: Never Smoker  . Smokeless tobacco: Never Used  Substance Use Topics  . Alcohol use: No  . Drug use: No     Allergies   Patient has no known allergies.   Review of Systems Review of Systems  All other systems reviewed and are negative.  Physical Exam Updated Vital Signs BP 115/67 (BP Location: Right Arm)   Pulse 65   Temp 98.4 F (36.9 C) (Oral)   Resp 16   Ht 6' (1.829 m)   Wt 49 kg   SpO2 99%   BMI 14.65 kg/m   Physical Exam  Constitutional: He is oriented to person, place, and time. He appears well-developed and well-nourished.  HENT:  Head:  Normocephalic and atraumatic.  Eyes: Pupils are equal, round, and reactive to light. Conjunctivae are normal. Right eye exhibits no discharge. Left eye exhibits no discharge. No scleral icterus.  Neck: Normal range of motion. No JVD present. No tracheal deviation present.  Pulmonary/Chest: Effort normal. No stridor.  Abdominal:  Abdomen soft nontender to palpation  Musculoskeletal:  Bilateral feet symmetrical with dry skin noted, no swelling or edema, tenderness to the plantar aspect of bilateral feet-left knee atraumatic no swelling or edema, decreased range of motion midline surgical scar without surrounding erythema  Neurological: He is alert and oriented to person, place, and time. Coordination normal.  Psychiatric: He has a normal mood and affect. His behavior is normal. Judgment and thought content normal.  Nursing note and vitals reviewed.   ED Treatments / Results  Labs (all labs ordered are listed, but only abnormal results are displayed) Labs Reviewed  CBC WITH DIFFERENTIAL/PLATELET - Abnormal; Notable for the following components:      Result Value   WBC 3.2 (*)    RBC 3.92 (*)    Hemoglobin 11.4 (*)    HCT 37.4 (*)    RDW 15.8 (*)    Platelets 129 (*)    Neutro Abs 1.6 (*)    All other components within normal limits  COMPREHENSIVE METABOLIC PANEL - Abnormal; Notable for the following components:   Total Protein 8.8 (*)    AST 46 (*)    All other components within normal limits  LIPASE, BLOOD  URINALYSIS, ROUTINE W REFLEX MICROSCOPIC    EKG None  Radiology No results found.  Procedures Procedures (including critical care time)  Medications Ordered in ED Medications - No data to display   Initial Impression / Assessment and Plan / ED Course  I have reviewed the triage vital signs and the nursing notes.  Pertinent labs & imaging results that were available during my care of the patient were reviewed by me and considered in my medical decision making (see  chart for details).     Labs: UA, CBC, CMP, lipase  Imaging:  Consults:  Therapeutics:  Discharge Meds: MiraLAX  Assessment/Plan: 62 year old male presents today with several complaints.  Patient having chronic musculoskeletal pain with no signs of infection or trauma, he will be referred to orthopedics for ongoing evaluation management.  Patient also having reported constipation although he did have a bowel movement this morning.  His labs are reassuring no indication for further imaging or evaluation given no signs of infection or obstruction.  Patient discharged home with MiraLAX.  Translator was used throughout the entire process as patient is deaf and blind.  Patient will follow-up with primary care, return precautions given.   Final Clinical Impressions(s) /  ED Diagnoses   Final diagnoses:  Pain in both feet  Constipation, unspecified constipation type    ED Discharge Orders         Ordered    polyethylene glycol (MIRALAX) packet  Daily,   Status:  Discontinued     08/15/18 2034    polyethylene glycol (MIRALAX) packet  Daily     08/15/18 2036           Eyvonne Mechanic, PA-C 08/16/18 1326    Alvira Monday, MD 08/17/18 1255

## 2018-08-15 NOTE — Progress Notes (Signed)
Pt is to be transported by [facility]. CSW provided pt's spouse with taxi voucher to return to where they are staying.   Montine Circle, [facility]  [phone]

## 2018-08-15 NOTE — ED Triage Notes (Signed)
Per ems pt has generalized pain but most pain is to right bottom of foot. Pain 10/10. abd distended. lbm this morning. Pt is blind and deaf

## 2018-08-15 NOTE — ED Notes (Signed)
[facility] called  

## 2018-08-30 DIAGNOSIS — H401133 Primary open-angle glaucoma, bilateral, severe stage: Secondary | ICD-10-CM | POA: Diagnosis not present

## 2018-09-17 DIAGNOSIS — H401133 Primary open-angle glaucoma, bilateral, severe stage: Secondary | ICD-10-CM | POA: Diagnosis not present

## 2018-09-17 DIAGNOSIS — H2513 Age-related nuclear cataract, bilateral: Secondary | ICD-10-CM | POA: Diagnosis not present

## 2018-09-22 ENCOUNTER — Emergency Department (HOSPITAL_COMMUNITY): Payer: Medicare Other

## 2018-09-22 ENCOUNTER — Emergency Department (HOSPITAL_COMMUNITY)
Admission: EM | Admit: 2018-09-22 | Discharge: 2018-09-22 | Disposition: A | Discharge status: Home / Self Care | Payer: Medicare Other | Attending: Emergency Medicine | Admitting: Emergency Medicine

## 2018-09-22 ENCOUNTER — Encounter (HOSPITAL_COMMUNITY): Payer: Self-pay | Admitting: *Deleted

## 2018-09-22 DIAGNOSIS — Z79899 Other long term (current) drug therapy: Secondary | ICD-10-CM | POA: Diagnosis not present

## 2018-09-22 DIAGNOSIS — K56609 Unspecified intestinal obstruction, unspecified as to partial versus complete obstruction: Secondary | ICD-10-CM | POA: Diagnosis not present

## 2018-09-22 DIAGNOSIS — E039 Hypothyroidism, unspecified: Secondary | ICD-10-CM | POA: Insufficient documentation

## 2018-09-22 DIAGNOSIS — K598 Other specified functional intestinal disorders: Secondary | ICD-10-CM

## 2018-09-22 DIAGNOSIS — R262 Difficulty in walking, not elsewhere classified: Secondary | ICD-10-CM | POA: Diagnosis not present

## 2018-09-22 DIAGNOSIS — K5981 Ogilvie syndrome: Secondary | ICD-10-CM

## 2018-09-22 DIAGNOSIS — R1084 Generalized abdominal pain: Secondary | ICD-10-CM | POA: Diagnosis not present

## 2018-09-22 DIAGNOSIS — K5909 Other constipation: Secondary | ICD-10-CM | POA: Diagnosis not present

## 2018-09-22 DIAGNOSIS — K56699 Other intestinal obstruction unspecified as to partial versus complete obstruction: Secondary | ICD-10-CM | POA: Diagnosis not present

## 2018-09-22 DIAGNOSIS — M2550 Pain in unspecified joint: Secondary | ICD-10-CM | POA: Diagnosis not present

## 2018-09-22 DIAGNOSIS — R109 Unspecified abdominal pain: Secondary | ICD-10-CM | POA: Diagnosis not present

## 2018-09-22 DIAGNOSIS — Z7401 Bed confinement status: Secondary | ICD-10-CM | POA: Diagnosis not present

## 2018-09-22 DIAGNOSIS — R52 Pain, unspecified: Secondary | ICD-10-CM | POA: Diagnosis not present

## 2018-09-22 DIAGNOSIS — R140 Abdominal distension (gaseous): Secondary | ICD-10-CM | POA: Diagnosis not present

## 2018-09-22 LAB — URINALYSIS, ROUTINE W REFLEX MICROSCOPIC
Bilirubin Urine: NEGATIVE
GLUCOSE, UA: NEGATIVE mg/dL
Hgb urine dipstick: NEGATIVE
KETONES UR: NEGATIVE mg/dL
LEUKOCYTES UA: NEGATIVE
NITRITE: NEGATIVE
PROTEIN: NEGATIVE mg/dL
Specific Gravity, Urine: 1.018 (ref 1.005–1.030)
pH: 6 (ref 5.0–8.0)

## 2018-09-22 LAB — COMPREHENSIVE METABOLIC PANEL
ALT: 52 U/L — ABNORMAL HIGH (ref 0–44)
ANION GAP: 11 (ref 5–15)
AST: 56 U/L — ABNORMAL HIGH (ref 15–41)
Albumin: 4.2 g/dL (ref 3.5–5.0)
Alkaline Phosphatase: 56 U/L (ref 38–126)
BUN: 15 mg/dL (ref 6–20)
CALCIUM: 9.3 mg/dL (ref 8.9–10.3)
CHLORIDE: 98 mmol/L (ref 98–111)
CO2: 28 mmol/L (ref 22–32)
Creatinine, Ser: 1.34 mg/dL — ABNORMAL HIGH (ref 0.61–1.24)
GFR, EST NON AFRICAN AMERICAN: 57 mL/min — AB (ref >60)
Glucose, Bld: 107 mg/dL — ABNORMAL HIGH (ref 70–99)
Potassium: 3.9 mmol/L (ref 3.5–5.1)
SODIUM: 137 mmol/L (ref 135–145)
Total Bilirubin: 0.9 mg/dL (ref 0.3–1.2)
Total Protein: 7.8 g/dL (ref 6.5–8.1)

## 2018-09-22 LAB — LIPASE, BLOOD: LIPASE: 45 U/L (ref 11–51)

## 2018-09-22 LAB — CBC
HCT: 34.4 % — ABNORMAL LOW (ref 39.0–52.0)
Hemoglobin: 10.7 g/dL — ABNORMAL LOW (ref 13.0–17.0)
MCH: 30.1 pg (ref 26.0–34.0)
MCHC: 31.1 g/dL (ref 30.0–36.0)
MCV: 96.6 fL (ref 80.0–100.0)
NRBC: 0 % (ref 0.0–0.2)
PLATELETS: 131 10*3/uL — AB (ref 150–400)
RBC: 3.56 MIL/uL — AB (ref 4.22–5.81)
RDW: 14.9 % (ref 11.5–15.5)
WBC: 3.2 10*3/uL — AB (ref 4.0–10.5)

## 2018-09-22 MED ORDER — ONDANSETRON 4 MG PO TBDP
4.0000 mg | ORAL_TABLET | Freq: Once | ORAL | Status: AC
  Administered 2018-09-22: 4 mg via ORAL
  Filled 2018-09-22: qty 1

## 2018-09-22 MED ORDER — BISACODYL 10 MG RE SUPP
10.0000 mg | Freq: Once | RECTAL | Status: AC
  Administered 2018-09-22: 10 mg via RECTAL
  Filled 2018-09-22: qty 1

## 2018-09-22 MED ORDER — MAGNESIUM CITRATE PO SOLN
1.0000 | Freq: Once | ORAL | Status: AC
  Administered 2018-09-22: 1 via ORAL
  Filled 2018-09-22: qty 296

## 2018-09-22 NOTE — ED Notes (Signed)
Updated pt and family that we are still waiting on the interpreter to arrive

## 2018-09-22 NOTE — ED Notes (Signed)
Interpreter is here.  Notified EDP.

## 2018-09-22 NOTE — ED Provider Notes (Signed)
[facility] EMERGENCY DEPARTMENT Provider Note   CSN: [id number] Arrival date & time: 09/22/18  1459     History   Chief Complaint Chief Complaint  Patient presents with  . Abdominal Pain    HPI Martin [name] is a 62 y.o. male.  Patient with hx Ogilvies syndrome, presents c/o abd pain earlier, and feeling as if had to have BM. Symptoms gradual onset, moderate, persistent. Pt took Mg citrate earlier today, and then did have result with that, but feels has to have additional bm, and is out of MG citrate. Pt is asking for a new bottle be provided to him. States bms have been regular recently. Denies vomiting. No fevers. Is currently not having any abd pain or nv.   The history is provided by the patient and a relative. A language interpreter was used (special sign interpreter used).  Abdominal Pain   Pertinent negatives include fever, vomiting and headaches.    Past Medical History:  Diagnosis Date  . Chronic constipation   . Deaf   . Gout   . Hypothyroidism   . Legally blind   . Ogilvie's syndrome   . Pyogenic arthritis of right knee joint [facility])     Patient Active Problem List   Diagnosis Date Noted  . Adjustment disorder with mixed anxiety and depressed mood 03/22/2017  . Lethargy 03/21/2017  . Acute encephalopathy   . FTT (failure to thrive) in adult   . Protein-calorie malnutrition, severe 12/23/2015  . Septic joint (HCC) 12/21/2015  . Chronic pain syndrome 12/21/2015  . Legally blind 12/21/2015  . Deaf 12/21/2015  . Hypothyroidism 12/21/2015  . Swelling of left hand 12/21/2015  . Constipation     Past Surgical History:  Procedure Laterality Date  . EYE SURGERY Right    "relieved fluid pressure and cleaned it out"  . KNEE ARTHROSCOPY Right 12/22/2015   Procedure: ARTHROSCOPY WASHOUT OF SEPTIC RIGHT KNEE;  Surgeon: Sheral Apley, MD;  Location: [facility] OR;  Service: Orthopedics;  Laterality: Right;  . KNEE SURGERY Left 2015/2016         Home Medications    Prior to Admission medications   Medication Sig Start Date End Date Taking? Authorizing Provider  bimatoprost (LUMIGAN) 0.01 % SOLN Place 1 drop into both eyes at bedtime. 04/05/17   Vivianne Master, PA-C  colchicine 0.6 MG tablet Take 1 tablet (0.6 mg total) by mouth daily. 03/24/17   Hongalgi, Maximino Greenland, MD  dorzolamide-timolol (COSOPT) 22.3-6.8 MG/ML ophthalmic solution Place 1 drop into both eyes 2 (two) times daily. 04/05/17   Vivianne Master, PA-C  levothyroxine (SYNTHROID, LEVOTHROID) 125 MCG tablet Take 1 tablet (125 mcg total) by mouth daily before breakfast. 04/05/17   Vivianne Master, PA-C  Multiple Vitamin (MULTIVITAMIN WITH MINERALS) TABS tablet Take 1 tablet by mouth daily.    [provider]  polyethylene glycol (MIRALAX) packet Take 17 g by mouth daily. 08/15/18   Hedges, Tinnie Gens, PA-C  senna (SENOKOT) 8.6 MG TABS tablet Take 1 tablet (8.6 mg total) by mouth 2 (two) times daily as needed for mild constipation. 04/05/17   Vivianne Master, PA-C    Family History Family History  Family history unknown: Yes    Social History Social History   Tobacco Use  . Smoking status: Never Smoker  . Smokeless tobacco: Never Used  Substance Use Topics  . Alcohol use: No  . Drug use: No     Allergies   Patient has no known  allergies.   Review of Systems Review of Systems  Constitutional: Negative for fever.  HENT: Negative for sore throat.   Eyes: Negative for redness.  Respiratory: Negative for cough.   Cardiovascular: Negative for chest pain.  Gastrointestinal: Positive for abdominal pain. Negative for vomiting.  Genitourinary: Negative for flank pain.  Musculoskeletal: Negative for back pain.  Skin: Negative for rash.  Neurological: Negative for headaches.  Hematological: Does not bruise/bleed easily.  Psychiatric/Behavioral: Negative for confusion.     Physical Exam Updated Vital Signs BP 114/82   Pulse 60   Temp 98.2 F (36.8  C) (Oral)   Resp 20   Ht 1.676 m (5\' 6" )   Wt 49 kg   SpO2 98%   BMI 17.43 kg/m   Physical Exam Vitals signs and nursing note reviewed.  Constitutional:      Appearance: He is well-developed.  HENT:     Head: Atraumatic.  Eyes:     General: No scleral icterus.    Conjunctiva/sclera: Conjunctivae normal.  Neck:     Musculoskeletal: Neck supple.     Trachea: No tracheal deviation.  Cardiovascular:     Rate and Rhythm: Normal rate and regular rhythm.     Pulses: Normal pulses.     Heart sounds: Normal heart sounds. No murmur. No friction rub. No gallop.   Pulmonary:     Effort: Pulmonary effort is normal. No accessory muscle usage or respiratory distress.     Breath sounds: Normal breath sounds.  Abdominal:     General: Bowel sounds are normal.     Palpations: Abdomen is soft.     Tenderness: There is no abdominal tenderness.     Comments: Mild distension. Soft and non tender. No incarc hernia.   Genitourinary:    Comments: No cva tenderness. Rectal w moderate amount soft stool, light brown. No mass felt.  Musculoskeletal:        General: No swelling.  Skin:    General: Skin is warm and dry.     Capillary Refill: Capillary refill takes less than 2 seconds.     Findings: No rash.  Neurological:     Mental Status: He is alert.     Comments: Awake and alert. Communicates via special sign/tactile interpreter.   Psychiatric:        Mood and Affect: Mood normal.      ED Treatments / Results  Labs (all labs ordered are listed, but only abnormal results are displayed) Labs Reviewed  COMPREHENSIVE METABOLIC PANEL - Abnormal; Notable for the following components:      Result Value   Glucose, Bld 107 (*)    Creatinine, Ser 1.34 (*)    AST 56 (*)    ALT 52 (*)    GFR calc non Af Amer 57 (*)    All other components within normal limits  CBC - Abnormal; Notable for the following components:   WBC 3.2 (*)    RBC 3.56 (*)    Hemoglobin 10.7 (*)    HCT 34.4 (*)     Platelets 131 (*)    All other components within normal limits  LIPASE, BLOOD  URINALYSIS, ROUTINE W REFLEX MICROSCOPIC    EKG None  Radiology Dg Abd Acute W/chest  Result Date: 09/22/2018 CLINICAL DATA:  Abdominal pain and distention for 1 week. EXAM: DG ABDOMEN ACUTE W/ 1V CHEST COMPARISON:  December 21, 2015, January 04, 2017, and March 21, 2017. FINDINGS: Severe dilatation of the colon diffusely is essentially stable since  June of 2018 consistent with the patient's history of Ogilvie syndrome. No small bowel dilatation noted. No free air. Elevation of the diaphragms with low lung volumes. The chest is stable. No other acute abnormalities. IMPRESSION: Diffuse marked distension of the colon diffusely consistent with the patient's known Ogilvie's Syndrome. There is resulting elevation of the hemidiaphragms and low lung volumes. The study is markedly similar compared to March 21, 2017. Electronically Signed   By: Gerome Sam III M.D   On: 09/22/2018 17:21    Procedures Procedures (including critical care time)  Medications Ordered in ED Medications - No data to display   Initial Impression / Assessment and Plan / ED Course  I have reviewed the triage vital signs and the nursing notes.  Pertinent labs & imaging results that were available during my care of the patient were reviewed by me and considered in my medical decision making (see chart for details).  Labs and imaging.   Reviewed nursing notes and prior charts for additional history.   Additional hx via tactile sign interpreter.   Pt is requesting to be given Mg citrate - states works well for him in past.   Mg citrate po. Po fluids. No vomiting noted. Afebrile. abd soft nt.   Labs reviewed - c/w baseline.  Imaging reviewed - dilated colon loops, c/w prior imaging studies.   Pt currently appears stable for d/c.     Final Clinical Impressions(s) / ED Diagnoses   Final diagnoses:  None    ED Discharge Orders     None       Cathren Laine, MD 09/22/18 1752

## 2018-09-22 NOTE — ED Notes (Signed)
Pt very upset about mag citrate, states that this is not right and he needs the kind he took before.  Pt shows me a bottle of mag citrate from another manufacturer but the same volume and same amount of active ingredient.  I verified this for pt and explained this to him with use of an interpreter.  Pt became upset.  Will check back with pt.  BSC next to bed after suppository was given.

## 2018-09-22 NOTE — ED Notes (Signed)
Discharge instructions reviewed with patient by using interpreter. All questions answered. Pt transported home by [facility] with belongings

## 2018-09-22 NOTE — Discharge Instructions (Signed)
It was our pleasure to provide your ER care today - we hope that you feel better.  Drink plenty of fluids. Get adequate fiber in diet.   You may try colace (stool softener), and your Mg citrate or miralax as need - these medications are available over the counter.   Follow up with primary care doctor this Monday or Tuesday for recheck.  Return to ER if worse, new symptoms, fevers, new or severe abdominal pain, persistent vomiting, other concern.

## 2018-09-22 NOTE — ED Triage Notes (Signed)
Pt has been having abdominal pain.  He is deaf and blind.  Will call for interpreter.

## 2018-09-22 NOTE — ED Notes (Signed)
Deaf blind interpreter will be here in an hour.  Wall-E at the bedside at this time.

## 2018-09-22 NOTE — ED Notes (Signed)
Pt had results from BM, wiped pt but pt felt continued need to have additional BM.  [facility]

## 2018-09-22 NOTE — ED Notes (Signed)
Followed up with secretary about interpreter.  They are calling second agency as fist did not have anyone for him

## 2018-09-26 DIAGNOSIS — M25562 Pain in left knee: Secondary | ICD-10-CM | POA: Diagnosis not present

## 2018-09-26 DIAGNOSIS — M25561 Pain in right knee: Secondary | ICD-10-CM | POA: Diagnosis not present

## 2018-10-08 DIAGNOSIS — H401133 Primary open-angle glaucoma, bilateral, severe stage: Secondary | ICD-10-CM | POA: Diagnosis not present

## 2018-10-15 DIAGNOSIS — H401133 Primary open-angle glaucoma, bilateral, severe stage: Secondary | ICD-10-CM | POA: Diagnosis not present

## 2018-11-21 ENCOUNTER — Emergency Department (HOSPITAL_COMMUNITY)
Admission: EM | Admit: 2018-11-21 | Discharge: 2018-11-21 | Disposition: A | Discharge status: Home / Self Care | Payer: Medicare Other | Attending: Emergency Medicine | Admitting: Emergency Medicine

## 2018-11-21 ENCOUNTER — Encounter (HOSPITAL_COMMUNITY): Payer: Self-pay | Admitting: Emergency Medicine

## 2018-11-21 DIAGNOSIS — K56699 Other intestinal obstruction unspecified as to partial versus complete obstruction: Secondary | ICD-10-CM | POA: Insufficient documentation

## 2018-11-21 DIAGNOSIS — R1084 Generalized abdominal pain: Secondary | ICD-10-CM | POA: Diagnosis not present

## 2018-11-21 DIAGNOSIS — Z79899 Other long term (current) drug therapy: Secondary | ICD-10-CM | POA: Diagnosis not present

## 2018-11-21 DIAGNOSIS — K5981 Ogilvie syndrome: Secondary | ICD-10-CM

## 2018-11-21 DIAGNOSIS — Z743 Need for continuous supervision: Secondary | ICD-10-CM | POA: Diagnosis not present

## 2018-11-21 DIAGNOSIS — E039 Hypothyroidism, unspecified: Secondary | ICD-10-CM | POA: Insufficient documentation

## 2018-11-21 DIAGNOSIS — I959 Hypotension, unspecified: Secondary | ICD-10-CM | POA: Diagnosis not present

## 2018-11-21 DIAGNOSIS — K5641 Fecal impaction: Secondary | ICD-10-CM | POA: Diagnosis not present

## 2018-11-21 DIAGNOSIS — K598 Other specified functional intestinal disorders: Secondary | ICD-10-CM | POA: Diagnosis not present

## 2018-11-21 DIAGNOSIS — R52 Pain, unspecified: Secondary | ICD-10-CM | POA: Diagnosis not present

## 2018-11-21 DIAGNOSIS — R279 Unspecified lack of coordination: Secondary | ICD-10-CM | POA: Diagnosis not present

## 2018-11-21 MED ORDER — MAGNESIUM CITRATE PO SOLN
1.0000 | Freq: Once | ORAL | Status: AC
  Administered 2018-11-21: 1 via ORAL
  Filled 2018-11-21: qty 296

## 2018-11-21 MED ORDER — BISACODYL 10 MG RE SUPP
10.0000 mg | Freq: Once | RECTAL | Status: AC
  Administered 2018-11-21: 10 mg via RECTAL
  Filled 2018-11-21: qty 1

## 2018-11-21 NOTE — ED Notes (Signed)
Pt helped to [facility] with stand-by assist.

## 2018-11-21 NOTE — ED Provider Notes (Signed)
[name] EMERGENCY DEPARTMENT Provider Note   CSN: [id number] Arrival date & time: 11/21/18  1733     History   Chief Complaint Chief Complaint  Patient presents with  . Abdominal Pain    HPI [name] is a 63 y.o. male.  63 yo M with limited history initially, patient is blind and deaf and there is no immediate sign language interpretation available.  Interpreter arrived and was able to provide further history.  The patient has been increasingly bloated over the past 2 or 3 days.  Having decreased stool output.  Has tried MiraLAX without improvement.  He thinks that magnesium citrate usually works well for him but he had run out of his magnesium citrate and try to find it at the pharmacy and could not find the exact 1 and thinks that the other version that he took did not help him.  He denies fever or vomiting.  Feels just like his prior constipation.  The history is provided by the patient and the spouse.  Abdominal Pain  Pain location:  Generalized Pain quality: bloating   Pain radiates to:  Does not radiate Pain severity:  Severe Onset quality:  Sudden Duration:  2 days Timing:  Constant Progression:  Worsening Relieved by:  Nothing Worsened by:  Nothing Ineffective treatments:  None tried Associated symptoms: no chest pain, no chills, no diarrhea, no fever, no shortness of breath and no vomiting     Past Medical History:  Diagnosis Date  . Chronic constipation   . Deaf   . Gout   . Hypothyroidism   . Legally blind   . Ogilvie's syndrome   . Pyogenic arthritis of right knee joint [facility]     Patient Active Problem List   Diagnosis Date Noted  . Adjustment disorder with mixed anxiety and depressed mood 03/22/2017  . Lethargy 03/21/2017  . Acute encephalopathy   . FTT (failure to thrive) in adult   . Protein-calorie malnutrition, severe 12/23/2015  . Septic joint (HCC) 12/21/2015  . Chronic pain syndrome 12/21/2015  . Legally blind  12/21/2015  . Deaf 12/21/2015  . Hypothyroidism 12/21/2015  . Swelling of left hand 12/21/2015  . Constipation     Past Surgical History:  Procedure Laterality Date  . EYE SURGERY Right    "relieved fluid pressure and cleaned it out"  . KNEE ARTHROSCOPY Right 12/22/2015   Procedure: ARTHROSCOPY WASHOUT OF SEPTIC RIGHT KNEE;  Surgeon: Sheral Apley, MD;  Location: [facility] OR;  Service: Orthopedics;  Laterality: Right;  . KNEE SURGERY Left 2015/2016        Home Medications    Prior to Admission medications   Medication Sig Start Date End Date Taking? Authorizing Provider  bimatoprost (LUMIGAN) 0.01 % SOLN Place 1 drop into both eyes at bedtime. 04/05/17   Vivianne Master, PA-C  colchicine 0.6 MG tablet Take 1 tablet (0.6 mg total) by mouth daily. 03/24/17   Hongalgi, Maximino Greenland, MD  dorzolamide-timolol (COSOPT) 22.3-6.8 MG/ML ophthalmic solution Place 1 drop into both eyes 2 (two) times daily. 04/05/17   Vivianne Master, PA-C  levothyroxine (SYNTHROID, LEVOTHROID) 125 MCG tablet Take 1 tablet (125 mcg total) by mouth daily before breakfast. 04/05/17   Vivianne Master, PA-C  Multiple Vitamin (MULTIVITAMIN WITH MINERALS) TABS tablet Take 1 tablet by mouth daily.    [provider]  polyethylene glycol (MIRALAX) packet Take 17 g by mouth daily. 08/15/18   Hedges, Tinnie Gens, PA-C  senna (SENOKOT) 8.6 MG  TABS tablet Take 1 tablet (8.6 mg total) by mouth 2 (two) times daily as needed for mild constipation. 04/05/17   Vivianne Master, PA-C    Family History Family History  Family history unknown: Yes    Social History Social History   Tobacco Use  . Smoking status: Never Smoker  . Smokeless tobacco: Never Used  Substance Use Topics  . Alcohol use: No  . Drug use: No     Allergies   Patient has no known allergies.   Review of Systems Review of Systems  Constitutional: Negative for chills and fever.  HENT: Negative for congestion and facial swelling.   Eyes: Negative for  discharge and visual disturbance.  Respiratory: Negative for shortness of breath.   Cardiovascular: Negative for chest pain and palpitations.  Gastrointestinal: Positive for abdominal distention and abdominal pain. Negative for diarrhea and vomiting.  Musculoskeletal: Negative for arthralgias and myalgias.  Skin: Negative for color change and rash.  Neurological: Negative for tremors, syncope and headaches.  Psychiatric/Behavioral: Negative for confusion and dysphoric mood.     Physical Exam Updated Vital Signs BP (!) 139/99   Pulse 97   Temp 98.6 F (37 C) (Oral)   Resp 18   SpO2 98%   Physical Exam Vitals signs and nursing note reviewed.  Constitutional:      Appearance: He is well-developed.  HENT:     Head: Normocephalic and atraumatic.  Eyes:     Pupils: Pupils are equal, round, and reactive to light.  Neck:     Musculoskeletal: Normal range of motion and neck supple.     Vascular: No JVD.  Cardiovascular:     Rate and Rhythm: Normal rate and regular rhythm.     Heart sounds: No murmur. No friction rub. No gallop.   Pulmonary:     Effort: No respiratory distress.     Breath sounds: No wheezing.  Abdominal:     General: Bowel sounds are decreased. There is distension (tympanitic to percussion).     Tenderness: There is no guarding or rebound.  Musculoskeletal: Normal range of motion.  Skin:    Coloration: Skin is not pale.     Findings: No rash.  Neurological:     Mental Status: He is alert and oriented to person, place, and time.  Psychiatric:        Behavior: Behavior normal.      ED Treatments / Results  Labs (all labs ordered are listed, but only abnormal results are displayed) Labs Reviewed - No data to display  EKG None  Radiology No results found.  Procedures Fecal disimpaction Date/Time: 11/21/2018 9:47 PM Performed by: Melene Plan, DO Authorized by: Melene Plan, DO  Consent: Verbal consent obtained. Risks and benefits: risks, benefits and  alternatives were discussed Consent given by: patient Patient understanding: patient states understanding of the procedure being performed Patient identity confirmed: verbally with patient Time out: Immediately prior to procedure a "time out" was called to verify the correct patient, procedure, equipment, support staff and site/side marked as required. Local anesthesia used: no  Anesthesia: Local anesthesia used: no  Sedation: Patient sedated: no  Patient tolerance: Patient tolerated the procedure well with no immediate complications Comments: Clearly stool in the vault, stopped due to patient discomfort    (including critical care time)  Medications Ordered in ED Medications  magnesium citrate solution 1 Bottle (1 Bottle Oral Given 11/21/18 1908)  bisacodyl (DULCOLAX) suppository 10 mg (10 mg Rectal Given 11/21/18 1908)  Initial Impression / Assessment and Plan / ED Course  I have reviewed the triage vital signs and the nursing notes.  Pertinent labs & imaging results that were available during my care of the patient were reviewed by me and considered in my medical decision making (see chart for details).     63 yo M with a chief complaint of diffuse abdominal pain.  Has a history of Ogilvie syndrome.  Abdomen is distended with tympany.  Last time he was here and improved with magnesium citrate, and Dulcolax suppository.  Will trial that again here.  Patient with large bowel movement after some observation here in the emergency department.  Feels much better and requesting discharge home.  PCP follow-up.  9:48 PM:  I have discussed the diagnosis/risks/treatment options with the patient and family and believe the pt to be eligible for discharge home to follow-up with PCP. We also discussed returning to the ED immediately if new or worsening sx occur. We discussed the sx which are most concerning (e.g., sudden worsening pain, fever, inability to tolerate by mouth) that  necessitate immediate return. Medications administered to the patient during their visit and any new prescriptions provided to the patient are listed below.  Medications given during this visit Medications  magnesium citrate solution 1 Bottle (1 Bottle Oral Given 11/21/18 1908)  bisacodyl (DULCOLAX) suppository 10 mg (10 mg Rectal Given 11/21/18 1908)     The patient appears reasonably screen and/or stabilized for discharge and I doubt any other medical condition or other [facility] requiring further screening, evaluation, or treatment in the ED at this time prior to discharge.      Final Clinical Impressions(s) / ED Diagnoses   Final diagnoses:  Ogilvie's syndrome    ED Discharge Orders    None       Melene PlanFloyd, [place], DO 11/21/18 2148

## 2018-11-21 NOTE — ED Triage Notes (Signed)
Pt  Here from home with hotel with c/po abd pain and distention , pt is deaf and blind , pt has history of same

## 2018-11-21 NOTE — ED Notes (Signed)
Discharge discussed with pt via interpreter. No further questions at this time.

## 2018-11-21 NOTE — ED Notes (Signed)
Pt finished using bed-side commode. Using interpreter, Pt states he feels better.

## 2018-11-21 NOTE — Discharge Instructions (Signed)
Follow up with your PCP. Return for worsening abdominal pain, vomiting

## 2018-11-21 NOTE — ED Notes (Signed)
Ptar called for pt 

## 2019-01-01 ENCOUNTER — Encounter (HOSPITAL_COMMUNITY): Payer: Self-pay

## 2019-01-01 ENCOUNTER — Emergency Department (HOSPITAL_COMMUNITY): Payer: Medicare Other

## 2019-01-01 ENCOUNTER — Emergency Department (HOSPITAL_COMMUNITY)
Admission: EM | Admit: 2019-01-01 | Discharge: 2019-01-02 | Disposition: A | Discharge status: Home / Self Care | Payer: Medicare Other | Attending: Emergency Medicine | Admitting: Emergency Medicine

## 2019-01-01 DIAGNOSIS — Z79899 Other long term (current) drug therapy: Secondary | ICD-10-CM | POA: Insufficient documentation

## 2019-01-01 DIAGNOSIS — E039 Hypothyroidism, unspecified: Secondary | ICD-10-CM | POA: Insufficient documentation

## 2019-01-01 DIAGNOSIS — R140 Abdominal distension (gaseous): Secondary | ICD-10-CM | POA: Diagnosis not present

## 2019-01-01 DIAGNOSIS — K5641 Fecal impaction: Secondary | ICD-10-CM | POA: Insufficient documentation

## 2019-01-01 DIAGNOSIS — R1084 Generalized abdominal pain: Secondary | ICD-10-CM | POA: Diagnosis not present

## 2019-01-01 DIAGNOSIS — R109 Unspecified abdominal pain: Secondary | ICD-10-CM | POA: Diagnosis not present

## 2019-01-01 DIAGNOSIS — R52 Pain, unspecified: Secondary | ICD-10-CM | POA: Diagnosis not present

## 2019-01-01 DIAGNOSIS — K5900 Constipation, unspecified: Secondary | ICD-10-CM | POA: Diagnosis present

## 2019-01-01 LAB — COMPREHENSIVE METABOLIC PANEL
ALT: 30 U/L (ref 0–44)
AST: 35 U/L (ref 15–41)
Albumin: 4.4 g/dL (ref 3.5–5.0)
Alkaline Phosphatase: 58 U/L (ref 38–126)
Anion gap: 12 (ref 5–15)
BUN: 19 mg/dL (ref 6–20)
CO2: 18 mmol/L — ABNORMAL LOW (ref 22–32)
Calcium: 9.5 mg/dL (ref 8.9–10.3)
Chloride: 107 mmol/L (ref 98–111)
Creatinine, Ser: 1.36 mg/dL — ABNORMAL HIGH (ref 0.61–1.24)
GFR calc Af Amer: >60 mL/min (ref >60)
GFR calc non Af Amer: 56 mL/min — ABNORMAL LOW (ref >60)
Glucose, Bld: 105 mg/dL — ABNORMAL HIGH (ref 70–99)
Potassium: 4.2 mmol/L (ref 3.5–5.1)
Sodium: 137 mmol/L (ref 135–145)
Total Bilirubin: 0.8 mg/dL (ref 0.3–1.2)
Total Protein: 8.6 g/dL — ABNORMAL HIGH (ref 6.5–8.1)

## 2019-01-01 LAB — CBC WITH DIFFERENTIAL/PLATELET
Abs Immature Granulocytes: 0.01 10*3/uL (ref 0.00–0.07)
Basophils Absolute: 0 10*3/uL (ref 0.0–0.1)
Basophils Relative: 1 %
Eosinophils Absolute: 0 10*3/uL (ref 0.0–0.5)
Eosinophils Relative: 1 %
HCT: 38.7 % — ABNORMAL LOW (ref 39.0–52.0)
Hemoglobin: 11.8 g/dL — ABNORMAL LOW (ref 13.0–17.0)
Immature Granulocytes: 0 %
Lymphocytes Relative: 24 %
Lymphs Abs: 0.8 10*3/uL (ref 0.7–4.0)
MCH: 30.3 pg (ref 26.0–34.0)
MCHC: 30.5 g/dL (ref 30.0–36.0)
MCV: 99.2 fL (ref 80.0–100.0)
Monocytes Absolute: 0.2 10*3/uL (ref 0.1–1.0)
Monocytes Relative: 5 %
Neutro Abs: 2.3 10*3/uL (ref 1.7–7.7)
Neutrophils Relative %: 69 %
Platelets: UNDETERMINED 10*3/uL (ref 150–400)
RBC: 3.9 MIL/uL — ABNORMAL LOW (ref 4.22–5.81)
RDW: 14.9 % (ref 11.5–15.5)
WBC: 3.3 10*3/uL — ABNORMAL LOW (ref 4.0–10.5)
nRBC: 0 % (ref 0.0–0.2)

## 2019-01-01 LAB — LIPASE, BLOOD: Lipase: 42 U/L (ref 11–51)

## 2019-01-01 MED ORDER — MILK AND MOLASSES ENEMA
1.0000 | Freq: Once | RECTAL | Status: AC
  Administered 2019-01-02: 240 mL via RECTAL

## 2019-01-01 MED ORDER — BISACODYL 10 MG RE SUPP
10.0000 mg | Freq: Once | RECTAL | Status: AC
  Administered 2019-01-01: 10 mg via RECTAL
  Filled 2019-01-01: qty 1

## 2019-01-01 MED ORDER — SODIUM CHLORIDE 0.9 % IV BOLUS
1000.0000 mL | Freq: Once | INTRAVENOUS | Status: AC
  Administered 2019-01-01: 1000 mL via INTRAVENOUS

## 2019-01-01 NOTE — ED Provider Notes (Signed)
[facility] EMERGENCY DEPARTMENT Provider Note   CSN: [id number] Arrival date & time: 01/01/19  1740    History   Chief Complaint Chief Complaint  Patient presents with  . Abdominal Pain    HPI [name] is a 63 y.o. male.     HPI Patient presents to the emergency department with constipation and abdominal distention over the last 2 days.  Patient states that he has not been able to eat due to the pain and swelling in his abdomen.  The patient history is obtained through a sign language interpreter.  Patient states that he has had this happen in the past but cannot give me any details.  The patient denies chest pain, shortness of breath, headache,blurred vision, neck pain, fever, cough, weakness, numbness, dizziness, anorexia, edema,  nausea, vomiting, diarrhea, rash, back pain, dysuria, hematemesis, bloody stool, near syncope, or syncope.  She did not try any treatments prior to arrival. Past Medical History:  Diagnosis Date  . Chronic constipation   . Deaf   . Gout   . Hypothyroidism   . Legally blind   . Ogilvie's syndrome   . Pyogenic arthritis of right knee joint [facility])     Patient Active Problem List   Diagnosis Date Noted  . Adjustment disorder with mixed anxiety and depressed mood 03/22/2017  . Lethargy 03/21/2017  . Acute encephalopathy   . FTT (failure to thrive) in adult   . Protein-calorie malnutrition, severe 12/23/2015  . Septic joint (HCC) 12/21/2015  . Chronic pain syndrome 12/21/2015  . Legally blind 12/21/2015  . Deaf 12/21/2015  . Hypothyroidism 12/21/2015  . Swelling of left hand 12/21/2015  . Constipation     Past Surgical History:  Procedure Laterality Date  . EYE SURGERY Right    "relieved fluid pressure and cleaned it out"  . KNEE ARTHROSCOPY Right 12/22/2015   Procedure: ARTHROSCOPY WASHOUT OF SEPTIC RIGHT KNEE;  Surgeon: Sheral Apley, MD;  Location: [facility] OR;  Service: Orthopedics;  Laterality: Right;  . KNEE SURGERY  Left 2015/2016        Home Medications    Prior to Admission medications   Medication Sig Start Date End Date Taking? Authorizing Provider  bimatoprost (LUMIGAN) 0.01 % SOLN Place 1 drop into both eyes at bedtime. 04/05/17   Vivianne Master, PA-C  colchicine 0.6 MG tablet Take 1 tablet (0.6 mg total) by mouth daily. 03/24/17   Hongalgi, Maximino Greenland, MD  dorzolamide-timolol (COSOPT) 22.3-6.8 MG/ML ophthalmic solution Place 1 drop into both eyes 2 (two) times daily. 04/05/17   Vivianne Master, PA-C  levothyroxine (SYNTHROID, LEVOTHROID) 125 MCG tablet Take 1 tablet (125 mcg total) by mouth daily before breakfast. 04/05/17   Vivianne Master, PA-C  Multiple Vitamin (MULTIVITAMIN WITH MINERALS) TABS tablet Take 1 tablet by mouth daily.    [provider]  polyethylene glycol (MIRALAX) packet Take 17 g by mouth daily. 08/15/18   Hedges, Tinnie Gens, PA-C  senna (SENOKOT) 8.6 MG TABS tablet Take 1 tablet (8.6 mg total) by mouth 2 (two) times daily as needed for mild constipation. 04/05/17   Vivianne Master, PA-C    Family History Family History  Family history unknown: Yes    Social History Social History   Tobacco Use  . Smoking status: Never Smoker  . Smokeless tobacco: Never Used  Substance Use Topics  . Alcohol use: No  . Drug use: No     Allergies   Ivp dye [iodinated diagnostic agents]  Review of Systems Review of Systems All other systems negative except as documented in the HPI. All pertinent positives and negatives as reviewed in the HPI.  Physical Exam Updated Vital Signs BP 116/87 (BP Location: Right Arm)   Pulse 66   Temp 98.3 F (36.8 C) (Oral)   Resp 18   Ht 6' (1.829 m)   SpO2 98%   BMI 14.65 kg/m   Physical Exam Vitals signs and nursing note reviewed.  Constitutional:      General: He is not in acute distress.    Appearance: He is well-developed.  HENT:     Head: Normocephalic and atraumatic.  Eyes:     Pupils: Pupils are equal, round, and reactive  to light.  Neck:     Musculoskeletal: Normal range of motion and neck supple.  Cardiovascular:     Rate and Rhythm: Normal rate and regular rhythm.     Heart sounds: Normal heart sounds. No murmur. No friction rub. No gallop.   Pulmonary:     Effort: Pulmonary effort is normal. No respiratory distress.     Breath sounds: Normal breath sounds. No wheezing.  Abdominal:     General: Bowel sounds are normal. There is distension.     Tenderness: There is generalized abdominal tenderness.  Skin:    General: Skin is warm and dry.     Capillary Refill: Capillary refill takes less than 2 seconds.     Findings: No erythema or rash.  Neurological:     Mental Status: He is alert and oriented to person, place, and time.     Motor: No abnormal muscle tone.     Coordination: Coordination normal.  Psychiatric:        Behavior: Behavior normal.      ED Treatments / Results  Labs (all labs ordered are listed, but only abnormal results are displayed) Labs Reviewed  COMPREHENSIVE METABOLIC PANEL - Abnormal; Notable for the following components:      Result Value   CO2 18 (*)    Glucose, Bld 105 (*)    Creatinine, Ser 1.36 (*)    Total Protein 8.6 (*)    GFR calc non Af Amer 56 (*)    All other components within normal limits  CBC WITH DIFFERENTIAL/PLATELET - Abnormal; Notable for the following components:   WBC 3.3 (*)    RBC 3.90 (*)    Hemoglobin 11.8 (*)    HCT 38.7 (*)    All other components within normal limits  LIPASE, BLOOD  URINALYSIS, ROUTINE W REFLEX MICROSCOPIC    EKG None  Radiology Ct Abdomen Pelvis Wo Contrast  Result Date: 01/01/2019 CLINICAL DATA:  63 year old male with abdominal pain, fullness, not eating. Evidence of Ogilvie's Syndrome on 2018 abdominal imaging. EXAM: CT ABDOMEN AND PELVIS WITHOUT CONTRAST TECHNIQUE: Multidetector CT imaging of the abdomen and pelvis was performed following the standard protocol without IV contrast. COMPARISON:  CT Abdomen and  Pelvis 12/27/2016. FINDINGS: Lower chest: Elevated hemidiaphragms with small bilateral layering pleural effusions. No lung base consolidation. Partially visible chronic cardiomegaly. No pericardial effusion is evident. Hepatobiliary: Negative noncontrast liver and gallbladder aside from mass effect from the dilated large bowel. Pancreas: Negative. Spleen: Negative aside from mass effect and small volume adjacent free fluid. Adrenals/Urinary Tract: Negative adrenal glands. Negative noncontrast kidneys and proximal ureters. Unremarkable urinary bladder. Stomach/Bowel: Stool ball in the distal sigmoid colon and rectum (sagittal image 67) with superimposed severe gas dilated upstream large bowel. Redundant sigmoid and transverse colon including  both flexures are dilated to 9-10 centimeters diameter. Relatively normal appearing gas and stool in the right colon. Nondilated distal small bowel. Oral contrast administered. The stomach is mildly compressed. Duodenum is decompressed. Several loops of jejunum in the left abdomen are at the upper limits of normal to mildly dilated, 34 millimeters diameter. And there is a transition point as these loops cross the midline subjacent to the markedly dilated large bowel. No pneumoperitoneum identified. There is a small volume of free fluid in the left upper quadrant subjacent to the diaphragm. Vascular/Lymphatic: Mild Calcified aortic atherosclerosis. Vascular patency is not evaluated in the absence of IV contrast. No lymphadenopathy. Reproductive: Suggestion of bilateral small hydroceles. Other: No pelvic free fluid. Musculoskeletal: Lower lumbar facet arthropathy. No acute osseous abnormality identified. IMPRESSION: 1. Rectosigmoid fecal impaction with markedly gas dilated and redundant upstream large bowel (10 cm diameter loops). Negative for large bowel volvulus. 2. Mild mechanical obstruction of the jejunum in the left abdomen appears secondary to regional mass effect from the  dilated large bowel in #1. 3. No pneumoperitoneum. Small volume of free fluid in the left upper quadrant. 4. Small bilateral layering pleural effusions. Electronically Signed   By: Odessa Fleming M.D.   On: 01/01/2019 22:32    Procedures Procedures (including critical care time)  Medications Ordered in ED Medications  milk and molasses enema (has no administration in time range)  sodium chloride 0.9 % bolus 1,000 mL (1,000 mLs Intravenous New Bag/Given 01/01/19 2123)  bisacodyl (DULCOLAX) suppository 10 mg (10 mg Rectal Given 01/01/19 2323)     Initial Impression / Assessment and Plan / ED Course  I have reviewed the triage vital signs and the nursing notes.  Pertinent labs & imaging results that were available during my care of the patient were reviewed by me and considered in my medical decision making (see chart for details).  Clinical Course as of Dec 31 2348  Tue Jan 01, 2019  [id number] 63 year old male difficult to communicate with secondary to deafness and blindness.  He has a history of chronic constipation is here with increased abdominal distention and abdominal pain.  He is diffusely tender on exam.  He is getting some basic lab work and a CT abdomen and pelvis.  Disposition per results of testing.   [MB]    Clinical Course User Index [MB] Terrilee Files, MD       Attempted to manually disimpact the patient and was unable able to get significant stool to come out due to the patient intolerant of the procedure.  There is a lot of high riding stool it felt like I was able to get that the very end of the stool with my finger over the patient again was unable to tolerate any of the procedure for the most part.  The patient has a large stool burden noted on CT scan along with significant distention of the large intestine.  Patient is adamant that he does not want to be admitted so were going to attempt an enema and suppository.  Final Clinical Impressions(s) / ED Diagnoses   Final  diagnoses:  None    ED Discharge Orders    None       Kyra Manges 01/01/19 2353    Terrilee Files, MD 01/02/19 1243

## 2019-01-01 NOTE — ED Triage Notes (Signed)
Pt presents with 5 day h/o constipation, 2 day h/o not eating and abdominal pain/fullness.

## 2019-01-01 NOTE — ED Notes (Signed)
Pt transported to and from CT via stretcher, tolerated well.

## 2019-01-02 DIAGNOSIS — K5641 Fecal impaction: Secondary | ICD-10-CM | POA: Diagnosis not present

## 2019-01-02 NOTE — Discharge Instructions (Signed)
Please follow up with your doctor.

## 2019-01-02 NOTE — ED Provider Notes (Signed)
Patient signed out to me at shift change.  Patient with fecal impaction and severe abdominal distention with markedly dilated gas-filled bowel loops.  After enema, patient evacuated a very very large amount of stool, his abdomen is now back to normal size and soft and nontender.  He feels well and wishes to be discharged.   Roxy Horseman, PA-C 01/02/19 0142    Terrilee Files, MD 01/02/19 1247

## 2019-05-16 DIAGNOSIS — M24662 Ankylosis, left knee: Secondary | ICD-10-CM | POA: Diagnosis not present

## 2019-05-16 DIAGNOSIS — M25562 Pain in left knee: Secondary | ICD-10-CM | POA: Diagnosis not present

## 2019-05-16 DIAGNOSIS — Z789 Other specified health status: Secondary | ICD-10-CM | POA: Diagnosis not present

## 2019-05-16 DIAGNOSIS — M1712 Unilateral primary osteoarthritis, left knee: Secondary | ICD-10-CM | POA: Diagnosis not present

## 2019-12-29 ENCOUNTER — Other Ambulatory Visit: Payer: Self-pay

## 2019-12-29 ENCOUNTER — Emergency Department (HOSPITAL_COMMUNITY)
Admission: EM | Admit: 2019-12-29 | Discharge: 2019-12-30 | Disposition: A | Discharge status: Home / Self Care | Payer: Medicare Other | Attending: Emergency Medicine | Admitting: Emergency Medicine

## 2019-12-29 DIAGNOSIS — R1907 Generalized intra-abdominal and pelvic swelling, mass and lump: Secondary | ICD-10-CM | POA: Diagnosis not present

## 2019-12-29 DIAGNOSIS — R630 Anorexia: Secondary | ICD-10-CM | POA: Insufficient documentation

## 2019-12-29 DIAGNOSIS — K5900 Constipation, unspecified: Secondary | ICD-10-CM | POA: Diagnosis not present

## 2019-12-29 DIAGNOSIS — Z79899 Other long term (current) drug therapy: Secondary | ICD-10-CM | POA: Diagnosis not present

## 2019-12-29 DIAGNOSIS — E039 Hypothyroidism, unspecified: Secondary | ICD-10-CM | POA: Diagnosis not present

## 2019-12-29 MED ORDER — SORBITOL 70 % SOLN
960.0000 mL | TOPICAL_OIL | Freq: Once | ORAL | Status: AC
  Administered 2019-12-29: 960 mL via RECTAL
  Filled 2019-12-29: qty 473

## 2019-12-29 NOTE — ED Notes (Signed)
Patient had one large bowel movement prior to enema administration. Enema administered. Patient lying on left side. Will continue to monitor.

## 2019-12-29 NOTE — ED Notes (Addendum)
Pharmacy contacted for enema. Enema will be administered when received.

## 2019-12-29 NOTE — ED Provider Notes (Signed)
[facility]-EMERGENCY DEPT Provider Note   CSN: [id number] Arrival date & time: 12/29/19  2103     History No chief complaint on file.   Martin [name] is a 64 y.o. male.  HPI Patient presents with a companion who helps to communicate with him.  Nursing talked to the patient, using a sign language interpreter, and his companion to sign to the patient.  Patient is deaf and has difficulty seeing, but is able to see signs, by his companion when she gets close to him.  By report, the patient has decreased stooling, with hard stools, and decreased appetite for 4 days.  Also, his abdomen has been swelling for this period of time.  There are no other reported problems, at this time.    Past Medical History:  Diagnosis Date  . Chronic constipation   . Deaf   . Gout   . Hypothyroidism   . Legally blind   . Ogilvie's syndrome   . Pyogenic arthritis of right knee joint [place])     Patient Active Problem List   Diagnosis Date Noted  . Adjustment disorder with mixed anxiety and depressed mood 03/22/2017  . Lethargy 03/21/2017  . Acute encephalopathy   . FTT (failure to thrive) in adult   . Protein-calorie malnutrition, severe 12/23/2015  . Septic joint (HCC) 12/21/2015  . Chronic pain syndrome 12/21/2015  . Legally blind 12/21/2015  . Deaf 12/21/2015  . Hypothyroidism 12/21/2015  . Swelling of left hand 12/21/2015  . Constipation     Past Surgical History:  Procedure Laterality Date  . EYE SURGERY Right    "relieved fluid pressure and cleaned it out"  . KNEE ARTHROSCOPY Right 12/22/2015   Procedure: ARTHROSCOPY WASHOUT OF SEPTIC RIGHT KNEE;  Surgeon: Sheral Apley, MD;  Location: [facility] OR;  Service: Orthopedics;  Laterality: Right;  . KNEE SURGERY Left 2015/2016       Family History  Family history unknown: Yes    Social History   Tobacco Use  . Smoking status: Never Smoker  . Smokeless tobacco: Never Used  Substance Use Topics  . Alcohol use: No  .  Drug use: No    Home Medications Prior to Admission medications   Medication Sig Start Date End Date Taking? Authorizing Provider  bimatoprost (LUMIGAN) 0.01 % SOLN Place 1 drop into both eyes at bedtime. 04/05/17   Vivianne Master, PA-C  colchicine 0.6 MG tablet Take 1 tablet (0.6 mg total) by mouth daily. 03/24/17   Hongalgi, Maximino Greenland, MD  dorzolamide-timolol (COSOPT) 22.3-6.8 MG/ML ophthalmic solution Place 1 drop into both eyes 2 (two) times daily. 04/05/17   Vivianne Master, PA-C  levothyroxine (SYNTHROID, LEVOTHROID) 125 MCG tablet Take 1 tablet (125 mcg total) by mouth daily before breakfast. 04/05/17   Vivianne Master, PA-C  Multiple Vitamin (MULTIVITAMIN WITH MINERALS) TABS tablet Take 1 tablet by mouth daily.    [provider]  polyethylene glycol (MIRALAX) packet Take 17 g by mouth daily. 08/15/18   Hedges, Tinnie Gens, PA-C  senna (SENOKOT) 8.6 MG TABS tablet Take 1 tablet (8.6 mg total) by mouth 2 (two) times daily as needed for mild constipation. 04/05/17   Vivianne Master, PA-C    Allergies    Ivp dye [iodinated diagnostic agents]  Review of Systems   Review of Systems  All other systems reviewed and are negative.   Physical Exam Updated Vital Signs BP (!) 144/82 (BP Location: Right Arm)   Pulse 68   Temp  98.3 F (36.8 C) (Oral)   Resp 14   Ht 6\' 1"  (1.854 m)   Wt 49.9 kg   SpO2 92%   BMI 14.51 kg/m   Physical Exam Vitals and nursing note reviewed.  Constitutional:      General: He is not in acute distress.    Appearance: He is well-developed. He is not ill-appearing, toxic-appearing or diaphoretic.  HENT:     Head: Normocephalic and atraumatic.     Right Ear: External ear normal.     Left Ear: External ear normal.  Eyes:     Conjunctiva/sclera: Conjunctivae normal.     Pupils: Pupils are equal, round, and reactive to light.  Neck:     Trachea: Phonation normal.  Cardiovascular:     Rate and Rhythm: Normal rate.  Pulmonary:     Effort: Pulmonary  effort is normal.  Abdominal:     General: There is distension.     Palpations: Abdomen is soft.     Tenderness: There is no abdominal tenderness.  Genitourinary:    Comments: Normal anus.  Moderate amount of hard firm stool deep in the rectal vault without fecal impaction.  Stool is brown in color without evidence for bleeding, hemorrhoids or fissure. Musculoskeletal:        General: Normal range of motion.     Cervical back: Normal range of motion and neck supple.  Skin:    General: Skin is warm and dry.  Neurological:     Mental Status: He is alert.     Cranial Nerves: No cranial nerve deficit.     Motor: No abnormal muscle tone.     Coordination: Coordination normal.  Psychiatric:        Mood and Affect: Mood normal.        Behavior: Behavior normal.     ED Results / Procedures / Treatments   Labs (all labs ordered are listed, but only abnormal results are displayed) Labs Reviewed - No data to display  EKG None  Radiology No results found.  Procedures Procedures (including critical care time)  Medications Ordered in ED Medications  sorbitol, milk of mag, mineral oil, glycerin (SMOG) enema (has no administration in time range)    ED Course  I have reviewed the triage vital signs and the nursing notes.  Pertinent labs & imaging results that were available during my care of the patient were reviewed by me and considered in my medical decision making (see chart for details).    MDM Rules/Calculators/A&P                       Patient Vitals for the past 24 hrs:  BP Temp Temp src Pulse Resp SpO2 Height Weight  12/29/19 2116 (!) 144/82 98.3 F (36.8 C) Oral 68 14 92 % 6\' 1"  (1.854 m) 49.9 kg     Medical Decision Making: Patient presenting with signs and symptoms of recurrent constipation.  Seen about a year ago in the emergency department with same.  ED treatment with enema.  CRITICAL CARE-no Performed by: Daleen Bo   Nursing Notes Reviewed/ Care  Coordinated Applicable Imaging Reviewed Interpretation of Laboratory Data incorporated into ED treatment  Disposition-per oncoming team following treatment with enema.   Final Clinical Impression(s) / ED Diagnoses Final diagnoses:  Constipation, unspecified constipation type    Rx / DC Orders ED Discharge Orders    None       Daleen Bo, MD 12/29/19 2138

## 2019-12-29 NOTE — ED Notes (Signed)
2nd vender looking for onsite interpreter

## 2019-12-29 NOTE — ED Notes (Signed)
No on site interpreter. Will call a second vendor.

## 2019-12-29 NOTE — ED Notes (Signed)
Sign language interpreter called waiting on call back

## 2019-12-29 NOTE — ED Triage Notes (Signed)
PER EMS: Pt is coming from home with c/o abdominal pain. PT states pain has been going on for the past 4 days. States he does have nausea and vomiting. Need interpreter for sign language.   EMS VITALS: BP 118/88 HR 76 RR 18 SPO2 96% RA

## 2019-12-29 NOTE — ED Notes (Signed)
No sign language interpreter available at this time

## 2019-12-30 MED ORDER — POLYETHYLENE GLYCOL 3350 17 GM/SCOOP PO POWD: 17.0000 g | Freq: Two times a day (BID) | ORAL | 0 refills | Status: DC

## 2019-12-30 MED ORDER — DOCUSATE SODIUM 100 MG PO CAPS: ORAL_CAPSULE | ORAL | 0 refills | Status: DC

## 2019-12-30 NOTE — ED Provider Notes (Signed)
Reassessed.  Had large BM.  Feeling better.  Ready to go home.  Will give miralax and colace.  Handout given with return precautions.   Roxy Horseman, PA-C 12/30/19 Marcie Bal    Mancel Bale, MD 12/30/19 [phone]

## 2019-12-30 NOTE — ED Notes (Signed)
Patient assisted to bedside commode. Call bell and tissue in reach.

## 2019-12-30 NOTE — ED Notes (Signed)
[facility] contacted for transportation. Paperwork at nurses station. 

## 2019-12-30 NOTE — ED Notes (Signed)
Patient was given saltine crackers, graham crackers, chicken noodle soup, orange juice and water.

## 2020-05-29 ENCOUNTER — Encounter (HOSPITAL_COMMUNITY): Payer: Self-pay

## 2020-05-29 ENCOUNTER — Emergency Department (HOSPITAL_COMMUNITY)
Admission: EM | Admit: 2020-05-29 | Discharge: 2020-05-30 | Disposition: A | Discharge status: Home / Self Care | Payer: Medicare Other | Attending: Emergency Medicine | Admitting: Emergency Medicine

## 2020-05-29 DIAGNOSIS — R197 Diarrhea, unspecified: Secondary | ICD-10-CM | POA: Insufficient documentation

## 2020-05-29 DIAGNOSIS — K5900 Constipation, unspecified: Secondary | ICD-10-CM | POA: Diagnosis not present

## 2020-05-29 DIAGNOSIS — E039 Hypothyroidism, unspecified: Secondary | ICD-10-CM | POA: Insufficient documentation

## 2020-05-29 DIAGNOSIS — Z79890 Hormone replacement therapy (postmenopausal): Secondary | ICD-10-CM | POA: Diagnosis not present

## 2020-05-29 DIAGNOSIS — R1084 Generalized abdominal pain: Secondary | ICD-10-CM | POA: Diagnosis present

## 2020-05-29 LAB — COMPREHENSIVE METABOLIC PANEL
ALT: 71 U/L — ABNORMAL HIGH (ref 0–44)
AST: 85 U/L — ABNORMAL HIGH (ref 15–41)
Albumin: 4 g/dL (ref 3.5–5.0)
Alkaline Phosphatase: 54 U/L (ref 38–126)
Anion gap: 9 (ref 5–15)
BUN: 16 mg/dL (ref 8–23)
CO2: 26 mmol/L (ref 22–32)
Calcium: 9.5 mg/dL (ref 8.9–10.3)
Chloride: 104 mmol/L (ref 98–111)
Creatinine, Ser: 1.39 mg/dL — ABNORMAL HIGH (ref 0.61–1.24)
GFR calc Af Amer: >60 mL/min (ref >60)
GFR calc non Af Amer: 54 mL/min — ABNORMAL LOW (ref >60)
Glucose, Bld: 100 mg/dL — ABNORMAL HIGH (ref 70–99)
Potassium: 3.7 mmol/L (ref 3.5–5.1)
Sodium: 139 mmol/L (ref 135–145)
Total Bilirubin: 0.5 mg/dL (ref 0.3–1.2)
Total Protein: 7.5 g/dL (ref 6.5–8.1)

## 2020-05-29 LAB — CBC
HCT: 31.9 % — ABNORMAL LOW (ref 39.0–52.0)
Hemoglobin: 10 g/dL — ABNORMAL LOW (ref 13.0–17.0)
MCH: 29.8 pg (ref 26.0–34.0)
MCHC: 31.3 g/dL (ref 30.0–36.0)
MCV: 94.9 fL (ref 80.0–100.0)
Platelets: 141 10*3/uL — ABNORMAL LOW (ref 150–400)
RBC: 3.36 MIL/uL — ABNORMAL LOW (ref 4.22–5.81)
RDW: 15 % (ref 11.5–15.5)
WBC: 3.3 10*3/uL — ABNORMAL LOW (ref 4.0–10.5)
nRBC: 0 % (ref 0.0–0.2)

## 2020-05-29 LAB — LIPASE, BLOOD: Lipase: 45 U/L (ref 11–51)

## 2020-05-29 NOTE — ED Triage Notes (Addendum)
Pt BIB [facility] for eval of LLQ abd pain and diarrhea since Wednesday. Pt is deaf, and visually impaired. Denies vomiting.

## 2020-05-30 ENCOUNTER — Emergency Department (HOSPITAL_COMMUNITY): Payer: Medicare Other

## 2020-05-30 ENCOUNTER — Other Ambulatory Visit: Payer: Self-pay

## 2020-05-30 DIAGNOSIS — K5900 Constipation, unspecified: Secondary | ICD-10-CM | POA: Diagnosis not present

## 2020-05-30 MED ORDER — BISACODYL 10 MG RE SUPP
10.0000 mg | Freq: Once | RECTAL | Status: AC
  Administered 2020-05-30: 10 mg via RECTAL
  Filled 2020-05-30: qty 1

## 2020-05-30 MED ORDER — MAGNESIUM CITRATE PO SOLN
1.0000 | Freq: Once | ORAL | Status: AC
  Administered 2020-05-30: 1 via ORAL
  Filled 2020-05-30: qty 296

## 2020-05-30 NOTE — ED Provider Notes (Signed)
[facility] EMERGENCY DEPARTMENT Provider Note   CSN: [id number] Arrival date & time: 05/29/20  2104     History Chief Complaint  Patient presents with  . Abdominal Pain    Martin [name] is a 64 y.o. male.  Patient is a 64 year old male with a history of Ogilvie syndrome, hypothyroidism and chronic constipation.  He also is deaf and legally blind.  He is accompanied by his wife who is at bedside.  History is difficult to obtain given his communication limitations but is obtained through his wife who is able to communicate via sign language with the patient and the sign language interpreter on the video language line.  She reports that the patient has had a month history of intermittent abdominal discomfort.  He alternates between some loose stools and constipation.  His abdomen has been getting increasingly bloated.  He has had no fevers.  No nausea or vomiting.  No difficulty with urination.  His symptoms seem similar to prior times when he has had constipation although he is also had some loose stools.  He has not take anything at home for the symptoms.        Past Medical History:  Diagnosis Date  . Chronic constipation   . Deaf   . Gout   . Hypothyroidism   . Legally blind   . Ogilvie's syndrome   . Pyogenic arthritis of right knee joint [facility])     Patient Active Problem List   Diagnosis Date Noted  . Adjustment disorder with mixed anxiety and depressed mood 03/22/2017  . Lethargy 03/21/2017  . Acute encephalopathy   . FTT (failure to thrive) in adult   . Protein-calorie malnutrition, severe 12/23/2015  . Septic joint (HCC) 12/21/2015  . Chronic pain syndrome 12/21/2015  . Legally blind 12/21/2015  . Deaf 12/21/2015  . Hypothyroidism 12/21/2015  . Swelling of left hand 12/21/2015  . Constipation     Past Surgical History:  Procedure Laterality Date  . EYE SURGERY Right    "relieved fluid pressure and cleaned it out"  . KNEE ARTHROSCOPY Right  12/22/2015   Procedure: ARTHROSCOPY WASHOUT OF SEPTIC RIGHT KNEE;  Surgeon: Sheral Apley, MD;  Location: [facility] OR;  Service: Orthopedics;  Laterality: Right;  . KNEE SURGERY Left 2015/2016       Family History  Family history unknown: Yes    Social History   Tobacco Use  . Smoking status: Never Smoker  . Smokeless tobacco: Never Used  Substance Use Topics  . Alcohol use: No  . Drug use: No    Home Medications Prior to Admission medications   Medication Sig Start Date End Date Taking? Authorizing Provider  bimatoprost (LUMIGAN) 0.01 % SOLN Place 1 drop into both eyes at bedtime. 04/05/17   Vivianne Master, PA-C  colchicine 0.6 MG tablet Take 1 tablet (0.6 mg total) by mouth daily. 03/24/17   Hongalgi, Maximino Greenland, MD  docusate sodium (COLACE) 100 MG capsule Take 2 tablets daily until stools are soft, then 1 tablet daily until regular 12/30/19   Roxy Horseman, PA-C  dorzolamide-timolol (COSOPT) 22.3-6.8 MG/ML ophthalmic solution Place 1 drop into both eyes 2 (two) times daily. 04/05/17   Vivianne Master, PA-C  levothyroxine (SYNTHROID, LEVOTHROID) 125 MCG tablet Take 1 tablet (125 mcg total) by mouth daily before breakfast. 04/05/17   Vivianne Master, PA-C  Multiple Vitamin (MULTIVITAMIN WITH MINERALS) TABS tablet Take 1 tablet by mouth daily.    [provider]  polyethylene  glycol powder (GLYCOLAX/MIRALAX) 17 GM/SCOOP powder Take 17 g by mouth 2 (two) times daily. 12/30/19   Roxy Horseman, PA-C  senna (SENOKOT) 8.6 MG TABS tablet Take 1 tablet (8.6 mg total) by mouth 2 (two) times daily as needed for mild constipation. 04/05/17   Vivianne Master, PA-C    Allergies    Ivp dye [iodinated diagnostic agents]  Review of Systems   Review of Systems  Constitutional: Negative for chills, diaphoresis, fatigue and fever.  HENT: Negative for congestion, rhinorrhea and sneezing.   Eyes: Negative.   Respiratory: Negative for cough, chest tightness and shortness of breath.     Cardiovascular: Negative for chest pain and leg swelling.  Gastrointestinal: Positive for abdominal distention, abdominal pain, constipation and diarrhea. Negative for blood in stool, nausea and vomiting.  Genitourinary: Negative for difficulty urinating, flank pain, frequency and hematuria.  Musculoskeletal: Negative for arthralgias and back pain.  Skin: Negative for rash.  Neurological: Negative for dizziness, speech difficulty, weakness, numbness and headaches.    Physical Exam Updated Vital Signs BP (!) 127/91 (BP Location: Right Arm)   Pulse 62   Temp 97.7 F (36.5 C) (Oral)   Resp 12   Ht 6\' 1"  (1.854 m)   Wt 50 kg   SpO2 96%   BMI 14.54 kg/m   Physical Exam Constitutional:      Appearance: He is well-developed.  HENT:     Head: Normocephalic and atraumatic.  Eyes:     Pupils: Pupils are equal, round, and reactive to light.  Cardiovascular:     Rate and Rhythm: Normal rate and regular rhythm.     Heart sounds: Normal heart sounds.  Pulmonary:     Effort: Pulmonary effort is normal. No respiratory distress.     Breath sounds: Normal breath sounds. No wheezing or rales.  Chest:     Chest wall: No tenderness.  Abdominal:     General: Bowel sounds are normal.     Palpations: Abdomen is soft.     Tenderness: There is generalized abdominal tenderness (Mild generalized discomfort, his abdomen is distended and tympanic to percussion, rectal exam reveals no impaction). There is no guarding or rebound.  Musculoskeletal:        General: Normal range of motion.     Cervical back: Normal range of motion and neck supple.  Lymphadenopathy:     Cervical: No cervical adenopathy.  Skin:    General: Skin is warm and dry.     Findings: No rash.  Neurological:     Mental Status: He is alert and oriented to person, place, and time.     ED Results / Procedures / Treatments   Labs (all labs ordered are listed, but only abnormal results are displayed) Labs Reviewed   COMPREHENSIVE METABOLIC PANEL - Abnormal; Notable for the following components:      Result Value   Glucose, Bld 100 (*)    Creatinine, Ser 1.39 (*)    AST 85 (*)    ALT 71 (*)    GFR calc non Af Amer 54 (*)    All other components within normal limits  CBC - Abnormal; Notable for the following components:   WBC 3.3 (*)    RBC 3.36 (*)    Hemoglobin 10.0 (*)    HCT 31.9 (*)    Platelets 141 (*)    All other components within normal limits  LIPASE, BLOOD  URINALYSIS, ROUTINE W REFLEX MICROSCOPIC    EKG None  Radiology CT  Abdomen Pelvis Wo Contrast  Result Date: 05/30/2020 CLINICAL DATA:  Left lower quadrant pain and diarrhea for 4 days. History of Ogilvie's syndrome. EXAM: CT ABDOMEN AND PELVIS WITHOUT CONTRAST TECHNIQUE: Multidetector CT imaging of the abdomen and pelvis was performed following the standard protocol without IV contrast. COMPARISON:  CT abdomen and pelvis 01/01/2019. FINDINGS: Lower chest: There are very small bilateral pleural effusions. No pericardial effusion. Mild bibasilar atelectasis noted. Hepatobiliary: No focal liver abnormality is seen. No gallstones, gallbladder wall thickening, or biliary dilatation. Mass effect on the liver due to markedly dilated colon is unchanged. Pancreas: Unremarkable. No pancreatic ductal dilatation or surrounding inflammatory changes. Spleen: Normal in size without focal abnormality. Adrenals/Urinary Tract: Adrenal glands are unremarkable. Kidneys are normal, without renal calculi, focal lesion, or hydronephrosis. Bladder is unremarkable. Stomach/Bowel: Massive gaseous distension of the colon throughout is again seen. There is a large stool ball in the rectum. Visualization is limited due to distention of the colon but no evidence of small-bowel obstruction is identified. There is mass effect on the stomach by large bowel. The stomach is otherwise unremarkable. Structure likely representing the appendix is unremarkable.  Vascular/Lymphatic: Aortic atherosclerosis. No enlarged abdominal or pelvic lymph nodes. Reproductive: Prostate is unremarkable. Other: None. Musculoskeletal: No acute or focal bony abnormality. IMPRESSION: No change in massive distention of the colon most consistent with the patient's history of Ogilvie's syndrome. Very small bilateral pleural effusions are unchanged since the prior CT. Large volume of stool in the rectosigmoid colon. Electronically Signed   By: Drusilla Kanner M.D.   On: 05/30/2020 14:01    Procedures Procedures (including critical care time)  Medications Ordered in ED Medications  bisacodyl (DULCOLAX) suppository 10 mg (10 mg Rectal Given 05/30/20 1707)  magnesium citrate solution 1 Bottle (1 Bottle Oral Given 05/30/20 1708)    ED Course  I have reviewed the triage vital signs and the nursing notes.  Pertinent labs & imaging results that were available during my care of the patient were reviewed by me and considered in my medical decision making (see chart for details).    MDM Rules/Calculators/A&P                          Patient is a 64 year old male with a history of Ogilvie syndrome who presents with abdominal distention and discomfort.  It was difficult to ascertain whether this was consistent with his prior episodes of constipation so a CT scan was performed which showed no change from his prior scans.  He had typical distention related to his Ogilvie's.  He typically gets better with a bottle of mag citrate and Dulcolax suppository.  These were given to the patient and he was able to have a bowel movement.  His wife indicates that he is feeling much better and back to baseline.  He has had no vomiting.  There is no signs of obstruction.  His labs are nonconcerning.  His creatinine is similar to his prior value.  He has some chronic mild neutropenia.  His hemoglobin is just slightly lower  than his prior value.  He was discharged home in good condition.  He was  encouraged to follow-up with his PCP.  Return precautions were given.  His wife requested a replacement knee sleeve.  He uses a knee sleeve for support of his right knee.  He has not had any new injuries or complaints.  He was given a knee sleeve prior to discharge. Final Clinical Impression(s) /  ED Diagnoses Final diagnoses:  Constipation, unspecified constipation type    Rx / DC Orders ED Discharge Orders    None       Rolan BuccoBelfi, [place], MD 05/30/20 2004

## 2020-05-30 NOTE — ED Notes (Signed)
Patient is resting comfortably. 

## 2020-05-30 NOTE — ED Notes (Signed)
Bedside toilet provided for pt

## 2020-05-30 NOTE — ED Notes (Signed)
[employer] interpreter for Martin [name] #[id number] used to interpret for wife of pt, who interpreted for pt, who is blind, administration of medication

## 2020-05-30 NOTE — Progress Notes (Signed)
This author attempted to start an IV. Once the skin was pierced, the patient started to pull away and reached over toward author and with a sweeping motion, indicated to stop. No further attempts made.

## 2020-05-30 NOTE — ED Notes (Signed)
IV team unable to get an IV on patient. Pt refused.

## 2020-06-03 ENCOUNTER — Inpatient Hospital Stay (HOSPITAL_COMMUNITY)
Admission: EM | Admit: 2020-06-03 | Discharge: 2020-06-06 | DRG: 388 | Disposition: A | Discharge status: Home / Self Care | Payer: Medicare Other | Attending: Internal Medicine | Admitting: Internal Medicine

## 2020-06-03 ENCOUNTER — Encounter (HOSPITAL_COMMUNITY): Payer: Self-pay

## 2020-06-03 ENCOUNTER — Emergency Department (HOSPITAL_COMMUNITY): Payer: Medicare Other

## 2020-06-03 DIAGNOSIS — K5641 Fecal impaction: Secondary | ICD-10-CM | POA: Diagnosis not present

## 2020-06-03 DIAGNOSIS — R140 Abdominal distension (gaseous): Secondary | ICD-10-CM | POA: Diagnosis not present

## 2020-06-03 DIAGNOSIS — Z91041 Radiographic dye allergy status: Secondary | ICD-10-CM

## 2020-06-03 DIAGNOSIS — D72819 Decreased white blood cell count, unspecified: Secondary | ICD-10-CM | POA: Diagnosis present

## 2020-06-03 DIAGNOSIS — M109 Gout, unspecified: Secondary | ICD-10-CM | POA: Diagnosis present

## 2020-06-03 DIAGNOSIS — K5909 Other constipation: Secondary | ICD-10-CM | POA: Diagnosis present

## 2020-06-03 DIAGNOSIS — H548 Legal blindness, as defined in USA: Secondary | ICD-10-CM | POA: Diagnosis present

## 2020-06-03 DIAGNOSIS — Z79899 Other long term (current) drug therapy: Secondary | ICD-10-CM

## 2020-06-03 DIAGNOSIS — K5981 Ogilvie syndrome: Secondary | ICD-10-CM | POA: Diagnosis present

## 2020-06-03 DIAGNOSIS — I129 Hypertensive chronic kidney disease with stage 1 through stage 4 chronic kidney disease, or unspecified chronic kidney disease: Secondary | ICD-10-CM | POA: Diagnosis present

## 2020-06-03 DIAGNOSIS — Z681 Body mass index (BMI) 19 or less, adult: Secondary | ICD-10-CM

## 2020-06-03 DIAGNOSIS — D696 Thrombocytopenia, unspecified: Secondary | ICD-10-CM | POA: Diagnosis present

## 2020-06-03 DIAGNOSIS — H9190 Unspecified hearing loss, unspecified ear: Secondary | ICD-10-CM

## 2020-06-03 DIAGNOSIS — D61818 Other pancytopenia: Secondary | ICD-10-CM | POA: Diagnosis present

## 2020-06-03 DIAGNOSIS — Z79890 Hormone replacement therapy (postmenopausal): Secondary | ICD-10-CM

## 2020-06-03 DIAGNOSIS — E43 Unspecified severe protein-calorie malnutrition: Secondary | ICD-10-CM | POA: Diagnosis present

## 2020-06-03 DIAGNOSIS — N189 Chronic kidney disease, unspecified: Secondary | ICD-10-CM | POA: Diagnosis present

## 2020-06-03 DIAGNOSIS — E039 Hypothyroidism, unspecified: Secondary | ICD-10-CM | POA: Diagnosis present

## 2020-06-03 DIAGNOSIS — Z20822 Contact with and (suspected) exposure to covid-19: Secondary | ICD-10-CM | POA: Diagnosis present

## 2020-06-03 DIAGNOSIS — N182 Chronic kidney disease, stage 2 (mild): Secondary | ICD-10-CM | POA: Diagnosis present

## 2020-06-03 LAB — CBC
HCT: 31.9 % — ABNORMAL LOW (ref 39.0–52.0)
Hemoglobin: 10.1 g/dL — ABNORMAL LOW (ref 13.0–17.0)
MCH: 30.5 pg (ref 26.0–34.0)
MCHC: 31.7 g/dL (ref 30.0–36.0)
MCV: 96.4 fL (ref 80.0–100.0)
Platelets: 149 10*3/uL — ABNORMAL LOW (ref 150–400)
RBC: 3.31 MIL/uL — ABNORMAL LOW (ref 4.22–5.81)
RDW: 15.2 % (ref 11.5–15.5)
WBC: 3.2 10*3/uL — ABNORMAL LOW (ref 4.0–10.5)
nRBC: 0 % (ref 0.0–0.2)

## 2020-06-03 LAB — COMPREHENSIVE METABOLIC PANEL
ALT: 42 U/L (ref 0–44)
AST: 47 U/L — ABNORMAL HIGH (ref 15–41)
Albumin: 4.2 g/dL (ref 3.5–5.0)
Alkaline Phosphatase: 48 U/L (ref 38–126)
Anion gap: 8 (ref 5–15)
BUN: 22 mg/dL (ref 8–23)
CO2: 26 mmol/L (ref 22–32)
Calcium: 9.4 mg/dL (ref 8.9–10.3)
Chloride: 104 mmol/L (ref 98–111)
Creatinine, Ser: 1.39 mg/dL — ABNORMAL HIGH (ref 0.61–1.24)
GFR calc Af Amer: >60 mL/min (ref >60)
GFR calc non Af Amer: 54 mL/min — ABNORMAL LOW (ref >60)
Glucose, Bld: 114 mg/dL — ABNORMAL HIGH (ref 70–99)
Potassium: 3.9 mmol/L (ref 3.5–5.1)
Sodium: 138 mmol/L (ref 135–145)
Total Bilirubin: 0.9 mg/dL (ref 0.3–1.2)
Total Protein: 7.7 g/dL (ref 6.5–8.1)

## 2020-06-03 LAB — LIPASE, BLOOD: Lipase: 42 U/L (ref 11–51)

## 2020-06-03 NOTE — ED Triage Notes (Addendum)
Pt BIB for eval of ongoing abd pain and abd distention since 8/20. Seen here at that time for same. Pt is deaf, blind. Cannot use ipad interpreter. Pt w/ hx of Ogilvie syndrome, chronic constipation and distention given mag citrate w/ relief.

## 2020-06-04 DIAGNOSIS — K5981 Ogilvie syndrome: Secondary | ICD-10-CM | POA: Diagnosis present

## 2020-06-04 DIAGNOSIS — E43 Unspecified severe protein-calorie malnutrition: Secondary | ICD-10-CM | POA: Diagnosis present

## 2020-06-04 DIAGNOSIS — Z79890 Hormone replacement therapy (postmenopausal): Secondary | ICD-10-CM | POA: Diagnosis not present

## 2020-06-04 DIAGNOSIS — N182 Chronic kidney disease, stage 2 (mild): Secondary | ICD-10-CM | POA: Diagnosis present

## 2020-06-04 DIAGNOSIS — D61818 Other pancytopenia: Secondary | ICD-10-CM | POA: Diagnosis present

## 2020-06-04 DIAGNOSIS — D696 Thrombocytopenia, unspecified: Secondary | ICD-10-CM | POA: Diagnosis present

## 2020-06-04 DIAGNOSIS — Z681 Body mass index (BMI) 19 or less, adult: Secondary | ICD-10-CM | POA: Diagnosis not present

## 2020-06-04 DIAGNOSIS — N189 Chronic kidney disease, unspecified: Secondary | ICD-10-CM | POA: Diagnosis present

## 2020-06-04 DIAGNOSIS — I129 Hypertensive chronic kidney disease with stage 1 through stage 4 chronic kidney disease, or unspecified chronic kidney disease: Secondary | ICD-10-CM | POA: Diagnosis present

## 2020-06-04 DIAGNOSIS — H548 Legal blindness, as defined in USA: Secondary | ICD-10-CM | POA: Diagnosis present

## 2020-06-04 DIAGNOSIS — E039 Hypothyroidism, unspecified: Secondary | ICD-10-CM | POA: Diagnosis present

## 2020-06-04 DIAGNOSIS — R140 Abdominal distension (gaseous): Secondary | ICD-10-CM | POA: Diagnosis present

## 2020-06-04 DIAGNOSIS — K5641 Fecal impaction: Secondary | ICD-10-CM | POA: Diagnosis present

## 2020-06-04 DIAGNOSIS — Z20822 Contact with and (suspected) exposure to covid-19: Secondary | ICD-10-CM | POA: Diagnosis present

## 2020-06-04 DIAGNOSIS — H9190 Unspecified hearing loss, unspecified ear: Secondary | ICD-10-CM | POA: Diagnosis present

## 2020-06-04 DIAGNOSIS — Z91041 Radiographic dye allergy status: Secondary | ICD-10-CM | POA: Diagnosis not present

## 2020-06-04 DIAGNOSIS — D72819 Decreased white blood cell count, unspecified: Secondary | ICD-10-CM | POA: Diagnosis present

## 2020-06-04 DIAGNOSIS — M109 Gout, unspecified: Secondary | ICD-10-CM | POA: Diagnosis present

## 2020-06-04 DIAGNOSIS — Z79899 Other long term (current) drug therapy: Secondary | ICD-10-CM | POA: Diagnosis not present

## 2020-06-04 LAB — SARS CORONAVIRUS 2 BY RT PCR (HOSPITAL ORDER, PERFORMED IN ~~LOC~~ HOSPITAL LAB): SARS Coronavirus 2: NEGATIVE

## 2020-06-04 MED ORDER — ONDANSETRON HCL 4 MG/2ML IJ SOLN: 4.0000 mg | Freq: Four times a day (QID) | INTRAMUSCULAR | Status: DC | PRN

## 2020-06-04 MED ORDER — ONDANSETRON HCL 4 MG PO TABS: 4.0000 mg | ORAL_TABLET | Freq: Four times a day (QID) | ORAL | Status: DC | PRN

## 2020-06-04 MED ORDER — BISACODYL 10 MG RE SUPP
10.0000 mg | Freq: Once | RECTAL | Status: AC
  Administered 2020-06-04: 10 mg via RECTAL
  Filled 2020-06-04: qty 1

## 2020-06-04 MED ORDER — ACETAMINOPHEN 325 MG PO TABS: 650.0000 mg | ORAL_TABLET | Freq: Four times a day (QID) | ORAL | Status: DC | PRN

## 2020-06-04 MED ORDER — ENOXAPARIN SODIUM 40 MG/0.4ML ~~LOC~~ SOLN: 40.0000 mg | SUBCUTANEOUS | Status: DC

## 2020-06-04 MED ORDER — ACETAMINOPHEN 650 MG RE SUPP: 650.0000 mg | Freq: Four times a day (QID) | RECTAL | Status: DC | PRN

## 2020-06-04 NOTE — ED Notes (Signed)
No ASL in person interpreters available at this time per NS

## 2020-06-04 NOTE — ED Provider Notes (Signed)
[facility] Provider Note   CSN: [id number] Arrival date & time: 06/03/20  2001     History Chief Complaint  Patient presents with  . Abdominal Pain    Martin MollLarry [name] is a 64 y.o. male.  HPI  LEVEL 5 CAVEAT 2/2 to deafness/blindness 64 year old male with a history of legal blindness, deafness, Ogilvie syndrome, gout presents to the ER with complaints of persistent abdominal distention and constipation.  History and communication with the patient is severely limited given his deafness and blindness.  Wife at bedside is also deaf, communicated with sign language translator with her at bedside.  Per the wife, his symptoms have not improved, however remain unchanged.  He is having painful bowel movements in the morning and difficulty passing stool.  He is having some abdominal distention.  Denies any fevers or chills.  No nausea or vomiting.  He states he has been drinking mostly liquids.  They have not followed up with GI.  He is not taking anything for his symptoms.  Per triage notes, patient was given some magnesium citrate and felt better.  Per chart review, patient was seen here on 8/20 for similar symptoms.  CT of the abdomen showed unchanged Ogilvie syndrome.  He had received magnesium citrate and Dulcolax suppository.  He was discharged in good condition, encouraged follow-up with PCP.      Past Medical History:  Diagnosis Date  . Chronic constipation   . Deaf   . Gout   . Hypothyroidism   . Legally blind   . Ogilvie's syndrome   . Pyogenic arthritis of right knee joint [facility](HCC)     Patient Active Problem List   Diagnosis Date Noted  . Abdominal distension 06/04/2020  . Ogilvie's syndrome   . Leukopenia   . Thrombocytopenia (HCC)   . CKD (chronic kidney disease)   . Distended abdomen   . Gout   . Adjustment disorder with mixed anxiety and depressed mood 03/22/2017  . Lethargy 03/21/2017  . Acute encephalopathy   . FTT (failure to  thrive) in adult   . Protein-calorie malnutrition, severe 12/23/2015  . Septic joint (HCC) 12/21/2015  . Chronic pain syndrome 12/21/2015  . Legally blind 12/21/2015  . Deaf 12/21/2015  . Hypothyroidism 12/21/2015  . Swelling of left hand 12/21/2015  . Chronic constipation     Past Surgical History:  Procedure Laterality Date  . EYE SURGERY Right    "relieved fluid pressure and cleaned it out"  . KNEE ARTHROSCOPY Right 12/22/2015   Procedure: ARTHROSCOPY WASHOUT OF SEPTIC RIGHT KNEE;  Surgeon: Sheral Apleyimothy D Murphy, MD;  Location: [facility] OR;  Service: Orthopedics;  Laterality: Right;  . KNEE SURGERY Left 2015/2016       Family History  Family history unknown: Yes    Social History   Tobacco Use  . Smoking status: Never Smoker  . Smokeless tobacco: Never Used  Substance Use Topics  . Alcohol use: No  . Drug use: No    Home Medications Prior to Admission medications   Medication Sig Start Date End Date Taking? Authorizing Provider  bimatoprost (LUMIGAN) 0.01 % SOLN Place 1 drop into both eyes at bedtime. 04/05/17   Vivianne MasterNoel, Tiffany S, PA-C  colchicine 0.6 MG tablet Take 1 tablet (0.6 mg total) by mouth daily. 03/24/17   Hongalgi, Maximino GreenlandAnand D, MD  docusate sodium (COLACE) 100 MG capsule Take 2 tablets daily until stools are soft, then 1 tablet daily until regular 12/30/19   Roxy HorsemanBrowning, Robert, PA-C  dorzolamide-timolol (COSOPT) 22.3-6.8 MG/ML ophthalmic solution Place 1 drop into both eyes 2 (two) times daily. 04/05/17   Vivianne Master, PA-C  levothyroxine (SYNTHROID, LEVOTHROID) 125 MCG tablet Take 1 tablet (125 mcg total) by mouth daily before breakfast. 04/05/17   Vivianne Master, PA-C  Multiple Vitamin (MULTIVITAMIN WITH MINERALS) TABS tablet Take 1 tablet by mouth daily.    [provider]  polyethylene glycol powder (GLYCOLAX/MIRALAX) 17 GM/SCOOP powder Take 17 g by mouth 2 (two) times daily. 12/30/19   Roxy Horseman, PA-C  senna (SENOKOT) 8.6 MG TABS tablet Take 1 tablet (8.6 mg  total) by mouth 2 (two) times daily as needed for mild constipation. 04/05/17   Vivianne Master, PA-C    Allergies    Ivp dye [iodinated diagnostic agents]  Review of Systems   Review of Systems  Constitutional: Negative for chills and fever.  HENT: Negative for ear pain and sore throat.   Eyes: Negative for pain and visual disturbance.  Respiratory: Negative for cough and shortness of breath.   Cardiovascular: Negative for chest pain and palpitations.  Gastrointestinal: Positive for abdominal distention, constipation and nausea. Negative for abdominal pain and vomiting.  Genitourinary: Negative for dysuria and hematuria.  Musculoskeletal: Negative for arthralgias and back pain.  Skin: Negative for color change and rash.  Neurological: Negative for seizures and syncope.  All other systems reviewed and are negative.   Physical Exam Updated Vital Signs BP 127/90 (BP Location: Right Arm)   Pulse 68   Temp (!) 97.5 F (36.4 C) (Oral)   Resp 14   Ht 6\' 1"  (1.854 m)   Wt 50 kg   SpO2 97%   BMI 14.54 kg/m   Physical Exam Vitals reviewed.  Constitutional:      General: He is not in acute distress.    Appearance: Normal appearance. He is well-developed. He is not ill-appearing, toxic-appearing or diaphoretic.  HENT:     Head: Normocephalic and atraumatic.  Eyes:     General:        Right eye: No discharge.        Left eye: No discharge.     Extraocular Movements: Extraocular movements intact.     Conjunctiva/sclera: Conjunctivae normal.  Cardiovascular:     Rate and Rhythm: Normal rate and regular rhythm.     Heart sounds: Normal heart sounds.  Pulmonary:     Effort: Pulmonary effort is normal.  Abdominal:     General: Abdomen is protuberant. Bowel sounds are normal. There is distension. There are no signs of injury.     Palpations: Abdomen is soft.     Tenderness: There is abdominal tenderness. There is no guarding. Negative signs include Murphy's sign and McBurney's  sign.     Hernia: No hernia is present.     Comments: Significantly distended abdomen, diffusely tender.  Musculoskeletal:        General: No swelling. Normal range of motion.  Skin:    General: Skin is warm and dry.     Coloration: Skin is not pale.  Neurological:     General: No focal deficit present.     Mental Status: He is alert and oriented to person, place, and time.  Psychiatric:        Mood and Affect: Mood normal.        Behavior: Behavior normal.     ED Results / Procedures / Treatments   Labs (all labs ordered are listed, but only abnormal results are displayed) Labs  Reviewed  COMPREHENSIVE METABOLIC PANEL - Abnormal; Notable for the following components:      Result Value   Glucose, Bld 114 (*)    Creatinine, Ser 1.39 (*)    AST 47 (*)    GFR calc non Af Amer 54 (*)    All other components within normal limits  CBC - Abnormal; Notable for the following components:   WBC 3.2 (*)    RBC 3.31 (*)    Hemoglobin 10.1 (*)    HCT 31.9 (*)    Platelets 149 (*)    All other components within normal limits  SARS CORONAVIRUS 2 BY RT PCR (HOSPITAL ORDER, PERFORMED IN [facility])  LIPASE, BLOOD  URINALYSIS, ROUTINE W REFLEX MICROSCOPIC  HIV ANTIBODY (ROUTINE TESTING W REFLEX)  MAGNESIUM  PHOSPHORUS  TSH  CBC  COMPREHENSIVE METABOLIC PANEL    EKG None  Radiology DG Abdomen 1 View  Result Date: 06/03/2020 CLINICAL DATA:  Abdominal pain and distension. History of Ogilvie syndrome and colonic constipation. Rule out small bowel obstruction. EXAM: ABDOMEN - 1 VIEW COMPARISON:  Abdominal CT 05/30/2020 FINDINGS: Gaseous colonic distension persists but is slightly improved from 05/30/2020 CT. There is stool distending the rectosigmoid colon with rectal distention of 8 cm. Moderate stool in the right colon. Air within small bowel loops are nondistended. No evidence of free air. No abnormal soft tissue calcifications. IMPRESSION: Gaseous colonic distension  persists but is slightly improved from CT 4 days ago. Stool distending the rectosigmoid colon with rectal distention of 8 cm. Findings consistent with Ogilvie syndrome. There is no small bowel distension or evidence of small-bowel obstruction. Electronically Signed   By: Narda Rutherford M.D.   On: 06/03/2020 21:21    Procedures Procedures (including critical care time)  Medications Ordered in ED Medications  acetaminophen (TYLENOL) tablet 650 mg (650 mg Oral Refused 06/04/20 1735)    Or  acetaminophen (TYLENOL) suppository 650 mg ( Rectal See Alternative 06/04/20 1735)  ondansetron (ZOFRAN) tablet 4 mg (has no administration in time range)    Or  ondansetron (ZOFRAN) injection 4 mg (has no administration in time range)  bisacodyl (DULCOLAX) suppository 10 mg (10 mg Rectal Given 06/04/20 1830)    ED Course  I have reviewed the triage vital signs and the nursing notes.  Pertinent labs & imaging results that were available during my care of the patient were reviewed by me and considered in my medical decision making (see chart for details).    MDM Rules/Calculators/A&P                          CBC largely unchanged from values  days ago, leukopenia of 3.2 and hemoglobin of 10.1 which appear to be stable.  Platelets of 149 which also are stable.  CMP without any electrolyte abnormalities, creatinine of 1.39 which appears to be at baseline.  AST at 47, improved from 85 6 days ago.  Normal ALT and bilirubin.  Plain films of the abdomen consistent with slightly improved Ogilvie syndrome.  No evidence of obstruction.  Consulted GI, patient to receive Dulcolax suppository here.  Patient was evaluated by Joana Reamer, PA-C and Dr. Dulce Sellar.  Given concerning follow-up, they recommend admission for obstapation.  Patient hemodynamically stable here.  Spoke with Dr. Leonides Cave who will admit the patient for further management.    Final Clinical Impression(s) / ED Diagnoses Final diagnoses:  Ogilvie  syndrome    Rx / DC Orders ED  Discharge Orders    None       Leone Brand 06/04/20 1952    Terald Sleeper, MD 06/05/20 [phone]

## 2020-06-04 NOTE — ED Notes (Signed)
Call for pt, no answer 

## 2020-06-04 NOTE — ED Notes (Signed)
Phlebotomist notified of needed blood work. Unable to collect. Attempted 2x. Pt did not tolerate well.

## 2020-06-04 NOTE — Consult Note (Signed)
[facility] Consultation Note  Referring Provider: Trudee Grip, PA-C Primary Care Physician:  Pa, [facility] Primary Gastroenterologist:  Gentry Fitz  Reason for Consultation:  Obstipation  HPI: Martin [name] is a 64 y.o. male with history of Ogilvie's syndrome who is legally blind and deaf presenting with obstipation in the setting of Ogilvie's syndrome.    [facility] video interpretation servcies (ASL, Marcelle Smiling [phone]) and patient's wife provided translation.  Patient has not had a bowel movement for 2 days. He has been having worsening abdominal distention over several days, as well as left lower quadrant abdominal pain.  Denies any nausea or vomiting. Patient typically has a BM daily; sometimes firm, sometimes loose.  Patient was previously on MiraLAX daily but states he has not taken this for at least 1 month.  Has also taken Colace in the past.  Patient last seen in ED 05/30/20 and treated with mag citrate and dulcolax suppository.  Further denies dysphagia, GERD, melena, hematochezia, changes in appetite, unexplained weight loss.  Patient wife states patient had a colonoscopy a few months ago, though this is not on file, and it is unclear whether or not pt has truly had a colonoscopy in the past.  Past Medical History:  Diagnosis Date  . Chronic constipation   . Deaf   . Gout   . Hypothyroidism   . Legally blind   . Ogilvie's syndrome   . Pyogenic arthritis of right knee joint [facility])     Past Surgical History:  Procedure Laterality Date  . EYE SURGERY Right    "relieved fluid pressure and cleaned it out"  . KNEE ARTHROSCOPY Right 12/22/2015   Procedure: ARTHROSCOPY WASHOUT OF SEPTIC RIGHT KNEE;  Surgeon: Sheral Apley, MD;  Location: [facility] OR;  Service: Orthopedics;  Laterality: Right;  . KNEE SURGERY Left 2015/2016    Prior to Admission medications   Medication Sig Start Date End Date Taking? Authorizing Provider  bimatoprost (LUMIGAN) 0.01 % SOLN Place 1 drop  into both eyes at bedtime. 04/05/17   Vivianne Master, PA-C  colchicine 0.6 MG tablet Take 1 tablet (0.6 mg total) by mouth daily. 03/24/17   Hongalgi, Maximino Greenland, MD  docusate sodium (COLACE) 100 MG capsule Take 2 tablets daily until stools are soft, then 1 tablet daily until regular 12/30/19   Roxy Horseman, PA-C  dorzolamide-timolol (COSOPT) 22.3-6.8 MG/ML ophthalmic solution Place 1 drop into both eyes 2 (two) times daily. 04/05/17   Vivianne Master, PA-C  levothyroxine (SYNTHROID, LEVOTHROID) 125 MCG tablet Take 1 tablet (125 mcg total) by mouth daily before breakfast. 04/05/17   Vivianne Master, PA-C  Multiple Vitamin (MULTIVITAMIN WITH MINERALS) TABS tablet Take 1 tablet by mouth daily.    [provider]  polyethylene glycol powder (GLYCOLAX/MIRALAX) 17 GM/SCOOP powder Take 17 g by mouth 2 (two) times daily. 12/30/19   Roxy Horseman, PA-C  senna (SENOKOT) 8.6 MG TABS tablet Take 1 tablet (8.6 mg total) by mouth 2 (two) times daily as needed for mild constipation. 04/05/17   Vivianne Master, PA-C    Current Facility-Administered Medications  Medication Dose Route Frequency Provider Last Rate Last Admin  . bisacodyl (DULCOLAX) suppository 10 mg  10 mg Rectal Once Mare Ferrari, PA-C       Current Outpatient Medications  Medication Sig Dispense Refill  . bimatoprost (LUMIGAN) 0.01 % SOLN Place 1 drop into both eyes at bedtime. 7.5 mL 2  . colchicine 0.6 MG tablet Take 1 tablet (0.6 mg total) by mouth  daily. 30 tablet 0  . docusate sodium (COLACE) 100 MG capsule Take 2 tablets daily until stools are soft, then 1 tablet daily until regular 30 capsule 0  . dorzolamide-timolol (COSOPT) 22.3-6.8 MG/ML ophthalmic solution Place 1 drop into both eyes 2 (two) times daily. 10 mL 2  . levothyroxine (SYNTHROID, LEVOTHROID) 125 MCG tablet Take 1 tablet (125 mcg total) by mouth daily before breakfast. 30 tablet 2  . Multiple Vitamin (MULTIVITAMIN WITH MINERALS) TABS tablet Take 1 tablet by mouth  daily.    . polyethylene glycol powder (GLYCOLAX/MIRALAX) 17 GM/SCOOP powder Take 17 g by mouth 2 (two) times daily. 255 g 0  . senna (SENOKOT) 8.6 MG TABS tablet Take 1 tablet (8.6 mg total) by mouth 2 (two) times daily as needed for mild constipation. 60 each 2    Allergies as of 06/03/2020 - Review Complete 06/03/2020  Allergen Reaction Noted  . Ivp dye [iodinated diagnostic agents] Other (See Comments) 01/01/2019    Family History  Family history unknown: Yes    Social History   Socioeconomic History  . Marital status: Married    Spouse name: Not on file  . Number of children: Not on file  . Years of education: Not on file  . Highest education level: Not on file  Occupational History  . Not on file  Tobacco Use  . Smoking status: Never Smoker  . Smokeless tobacco: Never Used  Substance and Sexual Activity  . Alcohol use: No  . Drug use: No  . Sexual activity: Yes  Other Topics Concern  . Not on file  Social History Narrative  . Not on file   Social Determinants of Health   Financial Resource Strain:   . Difficulty of Paying Living Expenses: Not on file  Food Insecurity:   . Worried About Programme researcher, broadcasting/film/videounning Out of Food in the Last Year: Not on file  . Ran Out of Food in the Last Year: Not on file  Transportation Needs:   . Lack of Transportation (Medical): Not on file  . Lack of Transportation (Non-Medical): Not on file  Physical Activity:   . Days of Exercise per Week: Not on file  . Minutes of Exercise per Session: Not on file  Stress:   . Feeling of Stress : Not on file  Social Connections:   . Frequency of Communication with Friends and Family: Not on file  . Frequency of Social Gatherings with Friends and Family: Not on file  . Attends Religious Services: Not on file  . Active Member of Clubs or Organizations: Not on file  . Attends BankerClub or Organization Meetings: Not on file  . Marital Status: Not on file  Intimate Partner Violence:   . Fear of Current or  Ex-Partner: Not on file  . Emotionally Abused: Not on file  . Physically Abused: Not on file  . Sexually Abused: Not on file    Review of Systems: Positive for: constipation, LLQ abdominal pain, abdominal distention Gen: Denies any fever, chills, rigors, night sweats, anorexia, fatigue, weakness, malaise, involuntary weight loss, and sleep disorder; +legally blind, deaf CV: Denies chest pain, angina, palpitations, syncope, orthopnea, PND, peripheral edema, and claudication. Resp: Denies dyspnea, cough, sputum, wheezing, coughing up blood. GI: Described in detail in HPI.   + constipation, LLQ abdominal pain, abdominal distention GU : Denies urinary burning, blood in urine, urinary frequency, urinary hesitancy, nocturnal urination, and urinary incontinence. MS: Denies joint pain or swelling.  Denies muscle weakness, cramps, atrophy.  Derm: Denies  rash, itching, oral ulcerations, hives, unhealing ulcers.  Psych: Denies depression, anxiety, memory loss, suicidal ideation, hallucinations,  and confusion. Heme: Denies bruising, bleeding, and enlarged lymph nodes. Neuro:  Denies any headaches, dizziness, paresthesias. Endo:  Denies any problems with DM, thyroid, adrenal function.  Physical Exam: Vital signs in last 24 hours: Temp:  [97.5 F (36.4 C)-98.8 F (37.1 C)] 97.5 F (36.4 C) (08/26 0706) Pulse Rate:  [54-74] 68 (08/26 1148) Resp:  [14-18] 14 (08/26 1148) BP: (115-132)/(82-91) 127/90 (08/26 1148) SpO2:  [96 %-100 %] 97 % (08/26 1148) Weight:  [50 kg] 50 kg (08/25 2012)   General:  Thin, pleasant and cooperative in NAD Head:  Normocephalic and atraumatic. Eyes:  Sclera clear, no icterus.   Conjunctiva pink. +Blindness Ears:  Pt is deaf Nose:  No deformity, discharge,  or lesions. Mouth:  No deformity or lesions.  Oropharynx pink & moist. Neck:  Supple; no masses or thyromegaly. Lungs:  Clear throughout to auscultation.   No wheezes, crackles, or rhonchi. No acute  distress. Heart:  Regular rate and rhythm; no murmurs, clicks, rubs,  or gallops. Abdomen:  Moderately distended with LLQ tenderness to palpation.  High-pitched, hypoactive bowel sounds. No peritoneal signs. Rectal exam: RN chaperone present; + stool ball in rectal vault Msk:  Symmetrical without gross deformities. Normal posture. Pulses:  Normal pulses noted. Extremities:  Without clubbing or edema. Neurologic:  Alert and oriented x4;  grossly normal neurologically. Skin:  Intact without significant lesions or rashes. Cervical Nodes:  No significant cervical adenopathy. Psych:  Alert and cooperative. Normal mood and affect.   Lab Results: Recent Labs    06/03/20 2035  WBC 3.2*  HGB 10.1*  HCT 31.9*  PLT 149*   BMET Recent Labs    06/03/20 2035  NA 138  K 3.9  CL 104  CO2 26  GLUCOSE 114*  BUN 22  CREATININE 1.39*  CALCIUM 9.4   LFT Recent Labs    06/03/20 2035  PROT 7.7  ALBUMIN 4.2  AST 47*  ALT 42  ALKPHOS 48  BILITOT 0.9   PT/INR No results for input(s): LABPROT, INR in the last 72 hours.  Studies/Results: DG Abdomen 1 View  Result Date: 06/03/2020 CLINICAL DATA:  Abdominal pain and distension. History of Ogilvie syndrome and colonic constipation. Rule out small bowel obstruction. EXAM: ABDOMEN - 1 VIEW COMPARISON:  Abdominal CT 05/30/2020 FINDINGS: Gaseous colonic distension persists but is slightly improved from 05/30/2020 CT. There is stool distending the rectosigmoid colon with rectal distention of 8 cm. Moderate stool in the right colon. Air within small bowel loops are nondistended. No evidence of free air. No abnormal soft tissue calcifications. IMPRESSION: Gaseous colonic distension persists but is slightly improved from CT 4 days ago. Stool distending the rectosigmoid colon with rectal distention of 8 cm. Findings consistent with Ogilvie syndrome. There is no small bowel distension or evidence of small-bowel obstruction. Electronically Signed   By:  Narda Rutherford M.D.   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 Stool distending the rectosigmoid colon with rectal distention of 8 cm. Findings consistent with Ogilvie syndrome. There is no small bowel distension or evidence of  small-bowel obstruction. -CT 05/30/20: No change in massive distention of the colon most consistent with the patient's history of Ogilvie's syndrome. Large volume of stool in the rectosigmoid  colon.  Plan: Recommend admission for further management of fecal impaction/chronic colonic pseudo-obstruction  Recommend manual disimpaction followed by 1 SMOG enema.  Maintain K+ >4 and  Mg >2.  NPO for now.  [facility] will follow.    LOS: 0 days   Edrick Kins  06/04/2020, 2:43 PM  Cell [phone] If no answer or after 5 PM call [phone]

## 2020-06-04 NOTE — Progress Notes (Signed)
Spoke with [facility] RN regarding VAST consult. At this time no infusions or test are ordered requiring a PIV start. Instructed on importance of vein preservation. Tomasita Morrow, RN VAST

## 2020-06-04 NOTE — Discharge Instructions (Addendum)
Continue to take Miralax everyday.

## 2020-06-04 NOTE — ED Notes (Signed)
Per phlebotomist pt refused to be stuck in his hand. Phlebotomist states that was the only place she could find any good veins.

## 2020-06-04 NOTE — ED Notes (Signed)
Two RN's attempted IVs both unsuccessful. Placing IV consult

## 2020-06-04 NOTE — H&P (Signed)
History and Physical    Martin [name] QMG:[id number] DOB: Aug 18, 1956 DOA: 06/03/2020  PCP: [name]  Patient coming from: Home  I have personally briefly reviewed patient's old medical records in [facility]  Chief Complaint: Abdominal pain and distention since 4 weeks.  HPI: Martin [name] is a 64 y.o. male with medical history significant of legal blindness, deafness, Ogilvie syndrome, gout, hypertension, hypothyroidism, chronic constipation presents to emergency department with worsening abdominal pain and distention since 4 weeks.  History gathered with the help of patient's wife who is also deaf, communicated with sign language translator at bedside.  Apparently patient has worsening abdominal pain, distention since couple of weeks, he came to ER couple of days ago, CT abdomen/pelvis shows no acute findings.  Patient received magnesium citrate and Dulcolax suppository and discharged home in stable condition.  His last bowel movement 2 days ago.  Reports abdominal pain 5-6 out of 10, nonradiating, generalized, no aggravating or relieving factors.  He reports that he has been passing gas.  No history of headache, blurry vision, fever, chills, nausea, vomiting, chest pain, shortness of breath, palpitation, leg swelling, urinary changes.  He is not vaccinated against COVID-19.  He lives with his wife at home.  No history of smoking, alcohol, illicit drug use.  ED Course: Upon arrival to ED: Patient's vital signs stable, afebrile with no leukocytosis, initial labs such as CBC shows normocytic anemia, WBC: 3.2-chronic, platelet: 149-chronic, CMP shows CKD stage II, lipase: WNL, UA pending, COVID-19 pending.  X-ray of abdomen ordered which shows gaseous colonic distention persist, stool distending the rectosigmoid colon with rectal distention of 8 cm.  Finding consistent with Ogilvie syndrome.  No small bowel distention or evidence of obstruction.  EDP consulted GI.  [facility]  consulted for worsening abdominal distention due to constipation. Review of Systems: As per HPI otherwise negative.    Past Medical History:  Diagnosis Date  . Chronic constipation   . Deaf   . Gout   . Hypothyroidism   . Legally blind   . Ogilvie's syndrome   . Pyogenic arthritis of right knee joint [facility])     Past Surgical History:  Procedure Laterality Date  . EYE SURGERY Right    "relieved fluid pressure and cleaned it out"  . KNEE ARTHROSCOPY Right 12/22/2015   Procedure: ARTHROSCOPY WASHOUT OF SEPTIC RIGHT KNEE;  Surgeon: Sheral Apley, MD;  Location: [facility] OR;  Service: Orthopedics;  Laterality: Right;  . KNEE SURGERY Left 2015/2016     reports that he has never smoked. He has never used smokeless tobacco. He reports that he does not drink alcohol and does not use drugs.  Allergies  Allergen Reactions  . Ivp Dye [Iodinated Diagnostic Agents] Other (See Comments)    Pt reports he has seizures.    Family History  Family history unknown: Yes    Prior to Admission medications   Medication Sig Start Date End Date Taking? Authorizing Provider  bimatoprost (LUMIGAN) 0.01 % SOLN Place 1 drop into both eyes at bedtime. 04/05/17   Vivianne Master, PA-C  colchicine 0.6 MG tablet Take 1 tablet (0.6 mg total) by mouth daily. 03/24/17   Hongalgi, Maximino Greenland, MD  docusate sodium (COLACE) 100 MG capsule Take 2 tablets daily until stools are soft, then 1 tablet daily until regular 12/30/19   Roxy Horseman, PA-C  dorzolamide-timolol (COSOPT) 22.3-6.8 MG/ML ophthalmic solution Place 1 drop into both eyes 2 (two) times daily. 04/05/17   Vivianne Master,  PA-C  levothyroxine (SYNTHROID, LEVOTHROID) 125 MCG tablet Take 1 tablet (125 mcg total) by mouth daily before breakfast. 04/05/17   Vivianne Master, PA-C  Multiple Vitamin (MULTIVITAMIN WITH MINERALS) TABS tablet Take 1 tablet by mouth daily.    [provider]  polyethylene glycol powder (GLYCOLAX/MIRALAX) 17 GM/SCOOP powder Take 17  g by mouth 2 (two) times daily. 12/30/19   Roxy Horseman, PA-C  senna (SENOKOT) 8.6 MG TABS tablet Take 1 tablet (8.6 mg total) by mouth 2 (two) times daily as needed for mild constipation. 04/05/17   Vivianne Master, PA-C    Physical Exam: Vitals:   06/04/20 0236 06/04/20 0706 06/04/20 0922 06/04/20 1148  BP: 130/84 132/82 115/89 127/90  Pulse: (!) 54 (!) 59 74 68  Resp: 18 18 16 14   Temp:  (!) 97.5 F (36.4 C)    TempSrc:  Oral    SpO2: 100% 96% 98% 97%  Weight:      Height:        Constitutional: NAD, calm, comfortable, on room air Eyes: PERRL, lids and conjunctivae normal ENMT: Mucous membranes are moist. Posterior pharynx clear of any exudate or lesions.Normal dentition.  Neck: normal, supple, no masses, no thyromegaly Respiratory: clear to auscultation bilaterally, no wheezing, no crackles. Normal respiratory effort. No accessory muscle use.  Cardiovascular: Regular rate and rhythm, no murmurs / rubs / gallops. No extremity edema. 2+ pedal pulses. No carotid bruits.  Abdomen: Abdomen soft, distended, mild generalized abdominal tenderness positive, no guarding, no rigidity, bowel sounds: Sluggish Musculoskeletal: no clubbing / cyanosis. No joint deformity upper and lower extremities. Good ROM, no contractures. Normal muscle tone.  Skin: no rashes, lesions, ulcers. No induration Neurologic: Deaf and blind.  Following commands.  Communicated via sign language with wife.    Labs on Admission: I have personally reviewed following labs and imaging studies  CBC: Recent Labs  Lab 05/29/20 2133 06/03/20 2035  WBC 3.3* 3.2*  HGB 10.0* 10.1*  HCT 31.9* 31.9*  MCV 94.9 96.4  PLT 141* 149*   Basic Metabolic Panel: Recent Labs  Lab 05/29/20 2133 06/03/20 2035  NA 139 138  K 3.7 3.9  CL 104 104  CO2 26 26  GLUCOSE 100* 114*  BUN 16 22  CREATININE 1.39* 1.39*  CALCIUM 9.5 9.4   GFR: Estimated Creatinine Clearance: 39 mL/min (A) (by C-G formula based on SCr of 1.39  mg/dL (H)). Liver Function Tests: Recent Labs  Lab 05/29/20 2133 06/03/20 2035  AST 85* 47*  ALT 71* 42  ALKPHOS 54 48  BILITOT 0.5 0.9  PROT 7.5 7.7  ALBUMIN 4.0 4.2   Recent Labs  Lab 05/29/20 2133 06/03/20 2035  LIPASE 45 42   No results for input(s): AMMONIA in the last 168 hours. Coagulation Profile: No results for input(s): INR, PROTIME in the last 168 hours. Cardiac Enzymes: No results for input(s): CKTOTAL, CKMB, CKMBINDEX, TROPONINI in the last 168 hours. BNP (last 3 results) No results for input(s): PROBNP in the last 8760 hours. HbA1C: No results for input(s): HGBA1C in the last 72 hours. CBG: No results for input(s): GLUCAP in the last 168 hours. Lipid Profile: No results for input(s): CHOL, HDL, LDLCALC, TRIG, CHOLHDL, LDLDIRECT in the last 72 hours. Thyroid Function Tests: No results for input(s): TSH, T4TOTAL, FREET4, T3FREE, THYROIDAB in the last 72 hours. Anemia Panel: No results for input(s): VITAMINB12, FOLATE, FERRITIN, TIBC, IRON, RETICCTPCT in the last 72 hours. Urine analysis:    Component Value Date/Time  COLORURINE YELLOW 09/22/2018 1723   APPEARANCEUR CLEAR 09/22/2018 1723   LABSPEC 1.018 09/22/2018 1723   PHURINE 6.0 09/22/2018 1723   GLUCOSEU NEGATIVE 09/22/2018 1723   HGBUR NEGATIVE 09/22/2018 1723   BILIRUBINUR NEGATIVE 09/22/2018 1723   KETONESUR NEGATIVE 09/22/2018 1723   PROTEINUR NEGATIVE 09/22/2018 1723   NITRITE NEGATIVE 09/22/2018 1723   LEUKOCYTESUR NEGATIVE 09/22/2018 1723    Radiological Exams on Admission: DG Abdomen 1 View  Result Date: 06/03/2020 CLINICAL DATA:  Abdominal pain and distension. History of Ogilvie syndrome and colonic constipation. Rule out small bowel obstruction. EXAM: ABDOMEN - 1 VIEW COMPARISON:  Abdominal CT 05/30/2020 FINDINGS: Gaseous colonic distension persists but is slightly improved from 05/30/2020 CT. There is stool distending the rectosigmoid colon with rectal distention of 8 cm. Moderate  stool in the right colon. Air within small bowel loops are nondistended. No evidence of free air. No abnormal soft tissue calcifications. IMPRESSION: Gaseous colonic distension persists but is slightly improved from CT 4 days ago. Stool distending the rectosigmoid colon with rectal distention of 8 cm. Findings consistent with Ogilvie syndrome. There is no small bowel distension or evidence of small-bowel obstruction. Electronically Signed   By: Narda Rutherford M.D.   On: 06/03/2020 21:21    Assessment/Plan Principal Problem:   Distended abdomen Active Problems:   Legally blind   Deaf   Hypothyroidism   Chronic constipation   Protein-calorie malnutrition, severe   Ogilvie's syndrome   Leukopenia   Thrombocytopenia (HCC)   CKD (chronic kidney disease)   Abdominal distension   Gout    Obstipation & fecal impaction: -Patient presented with worsening abdominal pain, distention and constipation. -Patient is afebrile, vital signs stable, COVID-19 pending.  Reviewed x-ray of abdomen and CT abdomen/pelvis result. -Admit patient on the floor. -EDP consulted GI recommended manual  disimpaction followed by enema followed by repeat imaging tomorrow AM. -We will keep him n.p.o.  Monitor vitals closely. -Avoid opiates. Check Magnesium.  Hypothyroidism: Check TSH.  Continue levothyroxine  Gout: Hold colchicine.  CKD stage II: At baseline.  Continue to monitor  Pancytopenia: -Patient has chronic leukopenia, thrombocytopenia and normocytic anemia: At baseline. -Continue to monitor.  Transfuse as needed.  Severe protein calorie malnutrition: -BMI of 14 -Consider dietitian consult  History of deafness/blindness/Ogilvie syndrome: Aware.  DVT prophylaxis: Lovenox/SCD Code Status: Full code  family Communication: Patient's wife present at bedside.  Plan of care discussed with patient in length and he verbalized understanding and agreed with it. Disposition Plan: Home after  disimpaction Consults called: GI by EDP  admission status: Inpatient   Ollen Bowl MD [facility]  If 7PM-7AM, please contact night-coverage [email or web address] Password [facility]  06/04/2020, 3:46 PM

## 2020-06-04 NOTE — ED Notes (Signed)
Attempting to use VIR interpreter. VIR suggests getting in person interpreter, as the wife who is also deaf is getting very frustrated with trying to help her husband who is visually impaired. The husband is getting frustrated as well.  I have reached out to CN to help facilitate getting an in person interpreter. Pt is refusing tylenol, wants alka seltzer. Pt reluctantly agrees to have covid swab. He has been vaccinated and didn't understand why we must swab. We explained the reason to him. He has agreed to get an IV and blood drawn.

## 2020-06-04 NOTE — ED Notes (Signed)
Pt on BSC at this time

## 2020-06-05 ENCOUNTER — Inpatient Hospital Stay (HOSPITAL_COMMUNITY): Payer: Medicare Other

## 2020-06-05 ENCOUNTER — Inpatient Hospital Stay: Payer: Medicare Other

## 2020-06-05 DIAGNOSIS — Z23 Encounter for immunization: Secondary | ICD-10-CM

## 2020-06-05 LAB — HIV ANTIBODY (ROUTINE TESTING W REFLEX): HIV Screen 4th Generation wRfx: NONREACTIVE

## 2020-06-05 LAB — COMPREHENSIVE METABOLIC PANEL
ALT: 38 U/L (ref 0–44)
AST: 40 U/L (ref 15–41)
Albumin: 4.3 g/dL (ref 3.5–5.0)
Alkaline Phosphatase: 54 U/L (ref 38–126)
Anion gap: 10 (ref 5–15)
BUN: 25 mg/dL — ABNORMAL HIGH (ref 8–23)
CO2: 24 mmol/L (ref 22–32)
Calcium: 9.9 mg/dL (ref 8.9–10.3)
Chloride: 103 mmol/L (ref 98–111)
Creatinine, Ser: 1.28 mg/dL — ABNORMAL HIGH (ref 0.61–1.24)
GFR calc Af Amer: >60 mL/min (ref >60)
GFR calc non Af Amer: 60 mL/min — ABNORMAL LOW (ref >60)
Glucose, Bld: 90 mg/dL (ref 70–99)
Potassium: 3.7 mmol/L (ref 3.5–5.1)
Sodium: 137 mmol/L (ref 135–145)
Total Bilirubin: 1.4 mg/dL — ABNORMAL HIGH (ref 0.3–1.2)
Total Protein: 8.2 g/dL — ABNORMAL HIGH (ref 6.5–8.1)

## 2020-06-05 LAB — CBC
HCT: 32.2 % — ABNORMAL LOW (ref 39.0–52.0)
Hemoglobin: 10.4 g/dL — ABNORMAL LOW (ref 13.0–17.0)
MCH: 30.7 pg (ref 26.0–34.0)
MCHC: 32.3 g/dL (ref 30.0–36.0)
MCV: 95 fL (ref 80.0–100.0)
Platelets: 137 10*3/uL — ABNORMAL LOW (ref 150–400)
RBC: 3.39 MIL/uL — ABNORMAL LOW (ref 4.22–5.81)
RDW: 15.3 % (ref 11.5–15.5)
WBC: 4.3 10*3/uL (ref 4.0–10.5)
nRBC: 0 % (ref 0.0–0.2)

## 2020-06-05 LAB — MAGNESIUM: Magnesium: 2.3 mg/dL (ref 1.7–2.4)

## 2020-06-05 LAB — PHOSPHORUS: Phosphorus: 3.2 mg/dL (ref 2.5–4.6)

## 2020-06-05 LAB — TSH: TSH: 48.394 u[IU]/mL — ABNORMAL HIGH (ref 0.350–4.500)

## 2020-06-05 MED ORDER — LEVOTHYROXINE SODIUM 25 MCG PO TABS
125.0000 ug | ORAL_TABLET | Freq: Every day | ORAL | Status: DC
  Administered 2020-06-06: 125 ug via ORAL
  Filled 2020-06-05: qty 1

## 2020-06-05 MED ORDER — POLYETHYLENE GLYCOL 3350 17 G PO PACK
17.0000 g | PACK | Freq: Two times a day (BID) | ORAL | Status: DC
  Administered 2020-06-05 – 2020-06-06 (×3): 17 g via ORAL
  Filled 2020-06-05 (×3): qty 1

## 2020-06-05 MED ORDER — LEVOTHYROXINE SODIUM 100 MCG/5ML IV SOLN
100.0000 ug | Freq: Once | INTRAVENOUS | Status: AC
  Administered 2020-06-05: 100 ug via INTRAVENOUS
  Filled 2020-06-05: qty 5

## 2020-06-05 MED ORDER — SORBITOL 70 % SOLN
960.0000 mL | TOPICAL_OIL | Freq: Once | ORAL | Status: AC
  Administered 2020-06-05: 960 mL via RECTAL
  Filled 2020-06-05: qty 473

## 2020-06-05 NOTE — Plan of Care (Signed)

## 2020-06-05 NOTE — Progress Notes (Signed)
° °  Covid-19 Vaccination Clinic  Name:  Koleson Reifsteck    MRN: [id number] DOB: [birth year]  06/05/2020  Mr. Loh was obse2rved post Covid-19 immunization for 15 minutes without incident. He was provided with Vaccine Information Sheet and instruction to access the V-Safe system.   Mr. Elie was instructed to call 911 with any severe reactions post vaccine:  Difficulty breathing   Swelling of face and throat   A fast heartbeat   A bad rash all over body   Dizziness and weakness   Immunizations Administered    Name Date Dose VIS Date Route   Pfizer COVID-19 Vaccine 06/05/2020  2:55 PM 0.3 mL 12/04/2018 Intramuscular   Manufacturer: [employer], [employer]   Lot: [id number]   NDC: [address]-5

## 2020-06-05 NOTE — Plan of Care (Signed)
  Problem: Clinical Measurements: Goal: Will remain free from infection Outcome: Progressing   Problem: Clinical Measurements: Goal: Will remain free from infection Outcome: Progressing   Problem: Clinical Measurements: Goal: Will remain free from infection Outcome: Progressing

## 2020-06-05 NOTE — Progress Notes (Signed)
[facility] Progress Note  Martin [name] 64 y.o. 1956/05/04  CC: Fecal impaction/obstipation  Subjective: Patient denies any complaints.  Abdominal pain improved.  Continues to be without nausea or vomiting.  Per flowsheet, patient had 1 bowel movement overnight.  Wife states it was small.  ROS : Review of Systems  Cardiovascular: Negative for chest pain and palpitations.  Gastrointestinal: Positive for constipation. Negative for abdominal pain, blood in stool, diarrhea, heartburn, melena, nausea and vomiting.    Objective: Vital signs in last 24 hours: Vitals:   06/05/20 0014 06/05/20 0440  BP: 131/76 109/74  Pulse: (!) 47 60  Resp: 17 14  Temp: 98.1 F (36.7 C) 99.4 F (37.4 C)  SpO2: 100% 98%    Physical Exam:  General:  Sleeping but easily aroused, cooperative, no acute distress, legally blind, deaf  Head:  Normocephalic, without obvious abnormality, atraumatic  Eyes:  Anicteric sclera, EOMs intact  Lungs:   Clear to auscultation bilaterally, respirations unlabored  Heart:  Regular rate and rhythm, S1, S2 normal  Abdomen:   Distended but soft (much improved compared to yesterday), sluggish bowel sounds, no guarding or peritoneal signs  Extremities: Extremities normal, atraumatic, no  edema  Pulses: 2+ and symmetric    Lab Results: Recent Labs    06/03/20 2035 06/05/20 0324  NA 138 137  K 3.9 3.7  CL 104 103  CO2 26 24  GLUCOSE 114* 90  BUN 22 25*  CREATININE 1.39* 1.28*  CALCIUM 9.4 9.9  MG  --  2.3  PHOS  --  3.2   Recent Labs    06/03/20 2035 06/05/20 0324  AST 47* 40  ALT 42 38  ALKPHOS 48 54  BILITOT 0.9 1.4*  PROT 7.7 8.2*  ALBUMIN 4.2 4.3   Recent Labs    06/03/20 2035 06/05/20 0324  WBC 3.2* 4.3  HGB 10.1* 10.4*  HCT 31.9* 32.2*  MCV 96.4 95.0  PLT 149* 137*   No results for input(s): LABPROT, INR in the last 72 hours.  Assessment: Obstipation/fecal impaction/chronic colonic pseudo-obstruction -One BM yesterday s/p  dulcolax suppository -Abdomen much softer today with less distention  Plan:  SMOG enema x 1.  Repeat plain film x-rays today.  Maintain K+ >4 and  Mg >2.  Pending repeat plain films, consider transition to clears.  [facility] will follow.  Edrick Kins PA-C 06/05/2020, 10:03 AM  Contact #  [phone]

## 2020-06-05 NOTE — Plan of Care (Signed)

## 2020-06-05 NOTE — Progress Notes (Signed)
PROGRESS NOTE    Martin [name]  MHW:[id number] DOB: April 29, 1956 DOA: 06/03/2020 PCP: [name]    Brief Narrative:  64 year old male with blindness and deafness, Ogilvie's syndrome, gout and hypertension, hypothyroidism and chronic constipation brought to the ER with worsening abdominal pain and distention for 4 weeks.  Accompanied by wife was also deaf. On the emergency room, vitals are stable.  COVID-19 negative.  X-ray showed gaseous colonic distention.  Admitted with GI consultation due to significant abdominal distention.   Assessment & Plan:   Principal Problem:   Distended abdomen Active Problems:   Legally blind   Deaf   Hypothyroidism   Chronic constipation   Protein-calorie malnutrition, severe   Ogilvie's syndrome   Leukopenia   Thrombocytopenia (HCC)   CKD (chronic kidney disease)   Abdominal distension   Gout  Severe symptomatic constipation and fecal impaction with history of Ogilvie's syndrome: Patient had successful bowel movement, small in amount with fecal disimpaction and MiraLAX. Dose of Sods enema today. MiraLAX 17 g twice daily as a scheduled. Suspect some of the constipation from severe hypothyroidism. Will discharge on aggressive bowel regimen. Repeat KUB today shows some improvement, challenge with oral diet.  Hypothyroidism: Severe hypothyroidism.  History of hypothyroidism on Synthroid at home.  TSH is 48.  Probably not taking Synthroid at home.  Had difficult communication, patient was not sure whether he is taking it or not. 1 dose of IV thyroxine today.  Resume home dose of thyroxine 125 mcg daily starting tomorrow. I discussed in detail with patient's wife, used a written communication for use of medications.  Gout: On colchicine.  CKD stage II: At about baseline.  Underweight/severe protein calorie malnutrition: BMI 14.  Nutrition to see.      COVID-19 vaccination status: Patient;s wife discharge to get COVID-19 vaccination per  patient while he is in the hospital.  This was done through vaccination clinic.  Patient received Covid vaccine today.   DVT prophylaxis: SCDs Start: 06/04/20 1545   Code Status: Full code Family Communication: Wife at the bedside Disposition Plan: Status is: Inpatient  Remains inpatient appropriate because:Inpatient level of care appropriate due to severity of illness   Dispo: The patient is from: Home              Anticipated d/c is to: Home              Anticipated d/c date is: 2 days              Patient currently is not medically stable to d/c.         Consultants:   Gastroenterology  Procedures:   None  Antimicrobials:   None   Subjective: Patient seen and examined.  He does not hear and cannot see.  His wife helps him translate very close to his eyes with waving.  Very difficult to communicate because of severe disabilities. Communicated with his wife in writing who communicated with the patient. Patient denied any abdominal pain.  He told his wife that he is hungry and wants to eat some chicken and juice. He consented for vaccine. He wanted to go home.  Objective: Vitals:   06/04/20 2350 06/05/20 0014 06/05/20 0440 06/05/20 0830  BP: (!) 128/92 131/76 109/74 110/75  Pulse: 67 (!) 47 60 71  Resp: 15 17 14    Temp: 97.9 F (36.6 C) 98.1 F (36.7 C) 99.4 F (37.4 C) 98.9 F (37.2 C)  TempSrc: Oral Oral Oral   SpO2: 98% 100%  98% 96%  Weight:      Height:        Intake/Output Summary (Last 24 hours) at 06/05/2020 1613 Last data filed at 06/04/2020 1930 Gross per 24 hour  Intake --  Output 1 ml  Net -1 ml   Filed Weights   06/03/20 2012  Weight: 50 kg    Examination:  General exam: Appears calm and comfortable  Difficult to assess for orientation.  However he was quite calm and he agreed for treatment plan including IV line placement and enema. He is deaf , blind. Respiratory system: Clear to auscultation. Respiratory effort  normal. Cardiovascular system: S1 & S2 heard, RRR. No JVD, murmurs, rubs, gallops or clicks. No pedal edema. Gastrointestinal system: Abdomen is nondistended, soft and nontender. No organomegaly or masses felt. Normal bowel sounds heard.  Data Reviewed: I have personally reviewed following labs and imaging studies  CBC: Recent Labs  Lab 05/29/20 2133 06/03/20 2035 06/05/20 0324  WBC 3.3* 3.2* 4.3  HGB 10.0* 10.1* 10.4*  HCT 31.9* 31.9* 32.2*  MCV 94.9 96.4 95.0  PLT 141* 149* 137*   Basic Metabolic Panel: Recent Labs  Lab 05/29/20 2133 06/03/20 2035 06/05/20 0324  NA 139 138 137  K 3.7 3.9 3.7  CL 104 104 103  CO2 26 26 24   GLUCOSE 100* 114* 90  BUN 16 22 25*  CREATININE 1.39* 1.39* 1.28*  CALCIUM 9.5 9.4 9.9  MG  --   --  2.3  PHOS  --   --  3.2   GFR: Estimated Creatinine Clearance: 42.3 mL/min (A) (by C-G formula based on SCr of 1.28 mg/dL (H)). Liver Function Tests: Recent Labs  Lab 05/29/20 2133 06/03/20 2035 06/05/20 0324  AST 85* 47* 40  ALT 71* 42 38  ALKPHOS 54 48 54  BILITOT 0.5 0.9 1.4*  PROT 7.5 7.7 8.2*  ALBUMIN 4.0 4.2 4.3   Recent Labs  Lab 05/29/20 2133 06/03/20 2035  LIPASE 45 42   No results for input(s): AMMONIA in the last 168 hours. Coagulation Profile: No results for input(s): INR, PROTIME in the last 168 hours. Cardiac Enzymes: No results for input(s): CKTOTAL, CKMB, CKMBINDEX, TROPONINI in the last 168 hours. BNP (last 3 results) No results for input(s): PROBNP in the last 8760 hours. HbA1C: No results for input(s): HGBA1C in the last 72 hours. CBG: No results for input(s): GLUCAP in the last 168 hours. Lipid Profile: No results for input(s): CHOL, HDL, LDLCALC, TRIG, CHOLHDL, LDLDIRECT in the last 72 hours. Thyroid Function Tests: Recent Labs    06/05/20 0324  TSH 48.394*   Anemia Panel: No results for input(s): VITAMINB12, FOLATE, FERRITIN, TIBC, IRON, RETICCTPCT in the last 72 hours. Sepsis Labs: No results for  input(s): PROCALCITON, LATICACIDVEN in the last 168 hours.  Recent Results (from the past 240 hour(s))  SARS Coronavirus 2 by RT PCR (hospital order, performed in [facility] hospital lab) Nasopharyngeal Nasopharyngeal Swab     Status: None   Collection Time: 06/04/20  4:33 PM   Specimen: Nasopharyngeal Swab  Result Value Ref Range Status   SARS Coronavirus 2 NEGATIVE NEGATIVE Final    Comment: (NOTE) SARS-CoV-2 target nucleic acids are NOT DETECTED.  The SARS-CoV-2 RNA is generally detectable in upper and lower respiratory specimens during the acute phase of infection. The lowest concentration of SARS-CoV-2 viral copies this assay can detect is 250 copies / mL. A negative result does not preclude SARS-CoV-2 infection and should not be used as the sole  basis for treatment or other patient management decisions.  A negative result may occur with improper specimen collection / handling, submission of specimen other than nasopharyngeal swab, presence of viral mutation(s) within the areas targeted by this assay, and inadequate number of viral copies (<250 copies / mL). A negative result must be combined with clinical observations, patient history, and epidemiological information.  Fact Sheet for Patients:   BoilerBrush.com.cy  Fact Sheet for Healthcare Providers: https://pope.com/  This test is not yet approved or  cleared by the [place] FDA and has been authorized for detection and/or diagnosis of SARS-CoV-2 by FDA under an Emergency Use Authorization (EUA).  This EUA will remain in effect (meaning this test can be used) for the duration of the COVID-19 declaration under Section 564(b)(1) of the Act, 21 U.S.C. section 360bbb-3(b)(1), unless the authorization is terminated or revoked sooner.  Performed at [facility], [address] [place], [place] [address]          Radiology Studies: DG Abdomen 1 View  Result  Date: 06/03/2020 CLINICAL DATA:  Abdominal pain and distension. History of Ogilvie syndrome and colonic constipation. Rule out small bowel obstruction. EXAM: ABDOMEN - 1 VIEW COMPARISON:  Abdominal CT 05/30/2020 FINDINGS: Gaseous colonic distension persists but is slightly improved from 05/30/2020 CT. There is stool distending the rectosigmoid colon with rectal distention of 8 cm. Moderate stool in the right colon. Air within small bowel loops are nondistended. No evidence of free air. No abnormal soft tissue calcifications. IMPRESSION: Gaseous colonic distension persists but is slightly improved from CT 4 days ago. Stool distending the rectosigmoid colon with rectal distention of 8 cm. Findings consistent with Ogilvie syndrome. There is no small bowel distension or evidence of small-bowel obstruction. Electronically Signed   By: Narda Rutherford M.D.   On: 06/03/2020 21:21   DG Abd 2 Views  Result Date: 06/05/2020 CLINICAL DATA:  Abdominal pain. Fecal impaction. Abdominal distension. EXAM: ABDOMEN - 2 VIEW COMPARISON:  06/03/2020 FINDINGS: Redemonstration of gaseous distension of the colon. Fecal matter previously seen in the rectosigmoid is no longer visible. Small bowel shows a small amount of gas. No sign of free air. Findings most consistent with pseudo obstruction. Chronic lower lumbar degenerative changes. IMPRESSION: Redemonstration of gaseous distension of the colon. Fecal matter previously seen in the rectosigmoid is no longer visible. Findings most consistent with pseudo obstruction. Electronically Signed   By: Paulina Fusi M.D.   On: 06/05/2020 11:43        Scheduled Meds: . [START ON 06/06/2020] levothyroxine  125 mcg Oral Q0600  . polyethylene glycol  17 g Oral BID   Continuous Infusions:   LOS: 1 day    Time spent: 32 minutes    Dorcas Carrow, MD [facility] Pager [phone]

## 2020-06-06 MED ORDER — POLYETHYLENE GLYCOL 3350 17 GM/SCOOP PO POWD: 17.0000 g | Freq: Two times a day (BID) | ORAL | 0 refills | Status: DC

## 2020-06-06 MED ORDER — LEVOTHYROXINE SODIUM 125 MCG PO TABS: 125.0000 ug | ORAL_TABLET | Freq: Every day | ORAL | 2 refills | Status: DC

## 2020-06-06 NOTE — Progress Notes (Signed)
Discharge package printed and instructions given to patient and wife. Verbalize understanding. 

## 2020-06-06 NOTE — Discharge Summary (Signed)
Physician Discharge Summary  Martin [name] TSV:[id number] DOB: 06-16-1956 DOA: 06/03/2020  PCP: [name]  Admit date: 06/03/2020 Discharge date: 06/06/2020  Admitted From: Home Disposition: Home  Recommendations for Outpatient Follow-up:  1. Follow up with PCP in 1-2 weeks 2. Check TSH in 1 month.  Discharge Condition: Stable CODE STATUS: Full code Diet recommendation: Regular diet  Discharge summary: 64 year old gentleman who is blind and deaf, hypothyroidism and probably not taking medications, chronic constipation brought to the ER with worsening abdominal pain and distention for about a month worse for the last few days.  At the emergency room vitals were stable.  COVID-19 was negative.  X-ray showed gaseous colonic distention.  Patient was admitted to the hospital due to significant symptoms, he was treated with multiple bowel regimen including laxatives, enema and manual stool disimpaction with improvement of symptoms and resulting multiple bowel movements.  Chronic constipation: Resolved.  Discharged on a scheduled MiraLAX 17 g twice a day.  Hypothyroidism: TSH was 48 and that may be contributing to constipation.  No other evidence of myxedema.  Very difficult communication because patient is blind as well as deaf.  Wife who is also deaf but able to communicate in writing with sign language.  He is probably not taking any Synthroid at home.  Every time he comes to the hospital, his levels were very high. We communicated this in writing with patient's wife and asked her to make sure patient is taking thyroid medication.  New prescriptions were sent to the local pharmacy.  Discharge Diagnoses:  Principal Problem:   Distended abdomen Active Problems:   Legally blind   Deaf   Hypothyroidism   Chronic constipation   Protein-calorie malnutrition, severe   Ogilvie's syndrome   Leukopenia   Thrombocytopenia (HCC)   CKD (chronic kidney disease)   Abdominal distension    Gout    Discharge Instructions  Discharge Instructions    Diet general   Complete by: As directed    Increase activity slowly   Complete by: As directed      Allergies as of 06/06/2020      Reactions   Ivp Dye [iodinated Diagnostic Agents] Other (See Comments)   Pt reports he has seizures.      Medication List    STOP taking these medications   colchicine 0.6 MG tablet   docusate sodium 100 MG capsule Commonly known as: COLACE   senna 8.6 MG Tabs tablet Commonly known as: SENOKOT     TAKE these medications   bimatoprost 0.01 % Soln Commonly known as: Lumigan Place 1 drop into both eyes at bedtime.   diclofenac 75 MG EC tablet Commonly known as: VOLTAREN Take 75 mg by mouth.   dorzolamide-timolol 22.3-6.8 MG/ML ophthalmic solution Commonly known as: COSOPT Place 1 drop into both eyes 2 (two) times daily.   GABAPENTIN PO Take by mouth.   levothyroxine 125 MCG tablet Commonly known as: SYNTHROID Take 1 tablet (125 mcg total) by mouth daily before breakfast.   polyethylene glycol powder 17 GM/SCOOP powder Commonly known as: GLYCOLAX/MIRALAX Take 17 g by mouth 2 (two) times daily.   Rhopressa 0.02 % Soln Generic drug: Netarsudil Dimesylate Place 1 drop into both eyes at bedtime.       Follow-up Information    [facility] Follow up in 1 week(s).   Specialty: Internal Medicine Contact information: [address] [place] [address] [phone]              Allergies  Allergen Reactions  . Ivp Dye [Iodinated Diagnostic Agents] Other (See Comments)    Pt reports he has seizures.    Consultations:  Gastroenterology   Procedures/Studies: CT Abdomen Pelvis Wo Contrast  Result Date: 05/30/2020 CLINICAL DATA:  Left lower quadrant pain and diarrhea for 4 days. History of Ogilvie's syndrome. EXAM: CT ABDOMEN AND PELVIS WITHOUT CONTRAST TECHNIQUE: Multidetector CT imaging of the abdomen and pelvis was performed following the standard  protocol without IV contrast. COMPARISON:  CT abdomen and pelvis 01/01/2019. FINDINGS: Lower chest: There are very small bilateral pleural effusions. No pericardial effusion. Mild bibasilar atelectasis noted. Hepatobiliary: No focal liver abnormality is seen. No gallstones, gallbladder wall thickening, or biliary dilatation. Mass effect on the liver due to markedly dilated colon is unchanged. Pancreas: Unremarkable. No pancreatic ductal dilatation or surrounding inflammatory changes. Spleen: Normal in size without focal abnormality. Adrenals/Urinary Tract: Adrenal glands are unremarkable. Kidneys are normal, without renal calculi, focal lesion, or hydronephrosis. Bladder is unremarkable. Stomach/Bowel: Massive gaseous distension of the colon throughout is again seen. There is a large stool ball in the rectum. Visualization is limited due to distention of the colon but no evidence of small-bowel obstruction is identified. There is mass effect on the stomach by large bowel. The stomach is otherwise unremarkable. Structure likely representing the appendix is unremarkable. Vascular/Lymphatic: Aortic atherosclerosis. No enlarged abdominal or pelvic lymph nodes. Reproductive: Prostate is unremarkable. Other: None. Musculoskeletal: No acute or focal bony abnormality. IMPRESSION: No change in massive distention of the colon most consistent with the patient's history of Ogilvie's syndrome. Very small bilateral pleural effusions are unchanged since the prior CT. Large volume of stool in the rectosigmoid colon. Electronically Signed   By: Drusilla Kannerhomas  Dalessio M.D.   On: 05/30/2020 14:01   DG Abdomen 1 View  Result Date: 06/03/2020 CLINICAL DATA:  Abdominal pain and distension. History of Ogilvie syndrome and colonic constipation. Rule out small bowel obstruction. EXAM: ABDOMEN - 1 VIEW COMPARISON:  Abdominal CT 05/30/2020 FINDINGS: Gaseous colonic distension persists but is slightly improved from 05/30/2020 CT. There is stool  distending the rectosigmoid colon with rectal distention of 8 cm. Moderate stool in the right colon. Air within small bowel loops are nondistended. No evidence of free air. No abnormal soft tissue calcifications. IMPRESSION: Gaseous colonic distension persists but is slightly improved from CT 4 days ago. Stool distending the rectosigmoid colon with rectal distention of 8 cm. Findings consistent with Ogilvie syndrome. There is no small bowel distension or evidence of small-bowel obstruction. Electronically Signed   By: Narda RutherfordMelanie  Sanford M.D.   On: 06/03/2020 21:21   DG Abd 2 Views  Result Date: 06/05/2020 CLINICAL DATA:  Abdominal pain. Fecal impaction. Abdominal distension. EXAM: ABDOMEN - 2 VIEW COMPARISON:  06/03/2020 FINDINGS: Redemonstration of gaseous distension of the colon. Fecal matter previously seen in the rectosigmoid is no longer visible. Small bowel shows a small amount of gas. No sign of free air. Findings most consistent with pseudo obstruction. Chronic lower lumbar degenerative changes. IMPRESSION: Redemonstration of gaseous distension of the colon. Fecal matter previously seen in the rectosigmoid is no longer visible. Findings most consistent with pseudo obstruction. Electronically Signed   By: Paulina FusiMark  Shogry M.D.   On: 06/05/2020 11:43   (Echo, Carotid, EGD, Colonoscopy, ERCP)    Subjective: Patient seen and examined.  No overnight events.  Since yesterday morning he had multiple bowel movements. Communicated with the help of patient's wife and she stated he is doing well and wants to go home.  Patient himself denied any complaints as much as we can understand and he wants to go home. Received Covid vaccine yesterday in the hospital.  No issues.   Discharge Exam: Vitals:   06/06/20 0350 06/06/20 0900  BP: (!) 85/67 103/64  Pulse: 64 62  Resp: 16 18  Temp: 98.8 F (37.1 C) 98.8 F (37.1 C)  SpO2: 99% 96%   Vitals:   06/05/20 1800 06/05/20 1953 06/06/20 0350 06/06/20 0900  BP:  98/60 (!) 87/60 (!) 85/67 103/64  Pulse:  (!) 58 64 62  Resp:  17 16 18   Temp:  98.5 F (36.9 C) 98.8 F (37.1 C) 98.8 F (37.1 C)  TempSrc:  Oral Oral Oral  SpO2:  98% 99% 96%  Weight:      Height:        General: Pt is alert, awake, not in acute distress Frail looking gentleman who is deaf and blind and unable to communicate with . Cardiovascular: RRR, S1/S2 +, no rubs, no gallops Respiratory: CTA bilaterally, no wheezing, no rhonchi Abdominal: Soft, NT, ND, bowel sounds + Extremities: no edema, no cyanosis    The results of significant diagnostics from this hospitalization (including imaging, microbiology, ancillary and laboratory) are listed below for reference.     Microbiology: Recent Results (from the past 240 hour(s))  SARS Coronavirus 2 by RT PCR (hospital order, performed in [facility] hospital lab) Nasopharyngeal Nasopharyngeal Swab     Status: None   Collection Time: 06/04/20  4:33 PM   Specimen: Nasopharyngeal Swab  Result Value Ref Range Status   SARS Coronavirus 2 NEGATIVE NEGATIVE Final    Comment: (NOTE) SARS-CoV-2 target nucleic acids are NOT DETECTED.  The SARS-CoV-2 RNA is generally detectable in upper and lower respiratory specimens during the acute phase of infection. The lowest concentration of SARS-CoV-2 viral copies this assay can detect is 250 copies / mL. A negative result does not preclude SARS-CoV-2 infection and should not be used as the sole basis for treatment or other patient management decisions.  A negative result may occur with improper specimen collection / handling, submission of specimen other than nasopharyngeal swab, presence of viral mutation(s) within the areas targeted by this assay, and inadequate number of viral copies (<250 copies / mL). A negative result must be combined with clinical observations, patient history, and epidemiological information.  Fact Sheet for Patients:    06/06/20  Fact Sheet for Healthcare Providers: BoilerBrush.com.cy  This test is not yet approved or  cleared by the https://pope.com/ FDA and has been authorized for detection and/or diagnosis of SARS-CoV-2 by FDA under an Emergency Use Authorization (EUA).  This EUA will remain in effect (meaning this test can be used) for the duration of the COVID-19 declaration under Section 564(b)(1) of the Act, 21 U.S.C. section 360bbb-3(b)(1), unless the authorization is terminated or revoked sooner.  Performed at [facility], [address] [place], [place]      Labs: BNP (last 3 results) No results for input(s): BNP in the last 8760 hours. Basic Metabolic Panel: Recent Labs  Lab 06/03/20 2035 06/05/20 0324  NA 138 137  K 3.9 3.7  CL 104 103  CO2 26 24  GLUCOSE 114* 90  BUN 22 25*  CREATININE 1.39* 1.28*  CALCIUM 9.4 9.9  MG  --  2.3  PHOS  --  3.2   Liver Function Tests: Recent Labs  Lab 06/03/20 2035 06/05/20 0324  AST 47* 40  ALT 42 38  ALKPHOS 48 54  BILITOT 0.9 1.4*  PROT 7.7 8.2*  ALBUMIN 4.2 4.3   Recent Labs  Lab 06/03/20 2035  LIPASE 42   No results for input(s): AMMONIA in the last 168 hours. CBC: Recent Labs  Lab 06/03/20 2035 06/05/20 0324  WBC 3.2* 4.3  HGB 10.1* 10.4*  HCT 31.9* 32.2*  MCV 96.4 95.0  PLT 149* 137*   Cardiac Enzymes: No results for input(s): CKTOTAL, CKMB, CKMBINDEX, TROPONINI in the last 168 hours. BNP: Invalid input(s): POCBNP CBG: No results for input(s): GLUCAP in the last 168 hours. D-Dimer No results for input(s): DDIMER in the last 72 hours. Hgb A1c No results for input(s): HGBA1C in the last 72 hours. Lipid Profile No results for input(s): CHOL, HDL, LDLCALC, TRIG, CHOLHDL, LDLDIRECT in the last 72 hours. Thyroid function studies Recent Labs    06/05/20 0324  TSH 48.394*   Anemia work up No results for input(s): VITAMINB12, FOLATE,  FERRITIN, TIBC, IRON, RETICCTPCT in the last 72 hours. Urinalysis    Component Value Date/Time   COLORURINE YELLOW 09/22/2018 1723   APPEARANCEUR CLEAR 09/22/2018 1723   LABSPEC 1.018 09/22/2018 1723   PHURINE 6.0 09/22/2018 1723   GLUCOSEU NEGATIVE 09/22/2018 1723   HGBUR NEGATIVE 09/22/2018 1723   BILIRUBINUR NEGATIVE 09/22/2018 1723   KETONESUR NEGATIVE 09/22/2018 1723   PROTEINUR NEGATIVE 09/22/2018 1723   NITRITE NEGATIVE 09/22/2018 1723   LEUKOCYTESUR NEGATIVE 09/22/2018 1723   Sepsis Labs Invalid input(s): PROCALCITONIN,  WBC,  LACTICIDVEN Microbiology Recent Results (from the past 240 hour(s))  SARS Coronavirus 2 by RT PCR (hospital order, performed in [facility] hospital lab) Nasopharyngeal Nasopharyngeal Swab     Status: None   Collection Time: 06/04/20  4:33 PM   Specimen: Nasopharyngeal Swab  Result Value Ref Range Status   SARS Coronavirus 2 NEGATIVE NEGATIVE Final    Comment: (NOTE) SARS-CoV-2 target nucleic acids are NOT DETECTED.  The SARS-CoV-2 RNA is generally detectable in upper and lower respiratory specimens during the acute phase of infection. The lowest concentration of SARS-CoV-2 viral copies this assay can detect is 250 copies / mL. A negative result does not preclude SARS-CoV-2 infection and should not be used as the sole basis for treatment or other patient management decisions.  A negative result may occur with improper specimen collection / handling, submission of specimen other than nasopharyngeal swab, presence of viral mutation(s) within the areas targeted by this assay, and inadequate number of viral copies (<250 copies / mL). A negative result must be combined with clinical observations, patient history, and epidemiological information.  Fact Sheet for Patients:   BoilerBrush.com.cy  Fact Sheet for Healthcare Providers: https://pope.com/  This test is not yet approved or  cleared by the  [place] FDA and has been authorized for detection and/or diagnosis of SARS-CoV-2 by FDA under an Emergency Use Authorization (EUA).  This EUA will remain in effect (meaning this test can be used) for the duration of the COVID-19 declaration under Section 564(b)(1) of the Act, 21 U.S.C. section 360bbb-3(b)(1), unless the authorization is terminated or revoked sooner.  Performed at [facility], [address] [place], [place] [address]      Time coordinating discharge: 35 minutes  SIGNED:   Dorcas Carrow, MD  [facility] 06/06/2020, 10:48 AM

## 2020-06-06 NOTE — Plan of Care (Signed)

## 2020-06-06 NOTE — TOC Progression Note (Signed)
Transition of Care [facility]) - Progression Note    Patient Details  Name: Martin [name] MRN: [id number] Date of Birth: 1956/07/10  Transition of Care [facility]) CM/SW Contact  Deveron Furlong, RN 06/06/2020, 2:01 PM  Clinical Narrative:    Patient to d/c home with wife and [facility] RN, CSW to ensure resources and patient taking medications.  Patient's wife prefers to communicate by writing notes per RN.  Wife unable to answer if patient had RN or CSW services in the home when questions written to her.  HHRN and CSW arranged with [facility].  Wife requested WC.  Ordered through [employer] for delivery when available.  There is a shortage of WC's and none are available at present.  [facility] arranged for patient transport home.      Barriers to Discharge: No Barriers Identified  Expected Discharge Plan and Services           Expected Discharge Date: 06/06/20               DME Arranged: Lightweight manual wheelchair with seat cushion DME Agency: [employer] Date DME Agency Contacted: 06/06/20 Time DME Agency Contacted: 1401 Representative spoke with at DME Agency: Velna Hatchet HH Arranged: RN, Social Work [facility] Agency: [facility] Date [facility] Agency Contacted: 06/06/20 Time HH Agency Contacted: 1301 Representative spoke with at [facility] Agency: [place]

## 2020-06-06 NOTE — Care Management (Signed)
°  °  Durable Medical Equipment  (From admission, onward)         Start     Ordered   06/06/20 1357  For home use only DME lightweight manual wheelchair with seat cushion  Once       Comments: Patient suffers from weakness which impairs their ability to perform daily activities like ADLs in the home.  A walker will not resolve  issue with performing activities of daily living. A wheelchair will allow patient to safely perform daily activities. Patient is not able to propel themselves in the home using a standard weight wheelchair due to weakness. Patient can self propel in the lightweight wheelchair. Length of need Lifetime. Accessories: elevating leg rests (ELRs), wheel locks, extensions and anti-tippers. Back cushion   06/06/20 1358

## 2020-07-15 ENCOUNTER — Other Ambulatory Visit: Payer: Self-pay

## 2020-07-15 ENCOUNTER — Observation Stay (HOSPITAL_COMMUNITY)
Admission: EM | Admit: 2020-07-15 | Discharge: 2020-07-15 | Disposition: A | Discharge status: Home / Self Care | Payer: Medicare Other | Attending: Internal Medicine | Admitting: Internal Medicine

## 2020-07-15 ENCOUNTER — Emergency Department (HOSPITAL_COMMUNITY): Payer: Medicare Other

## 2020-07-15 ENCOUNTER — Encounter (HOSPITAL_COMMUNITY): Payer: Self-pay | Admitting: Emergency Medicine

## 2020-07-15 DIAGNOSIS — E039 Hypothyroidism, unspecified: Secondary | ICD-10-CM | POA: Insufficient documentation

## 2020-07-15 DIAGNOSIS — R1032 Left lower quadrant pain: Secondary | ICD-10-CM | POA: Diagnosis present

## 2020-07-15 DIAGNOSIS — R109 Unspecified abdominal pain: Secondary | ICD-10-CM | POA: Diagnosis not present

## 2020-07-15 DIAGNOSIS — K5981 Ogilvie syndrome: Secondary | ICD-10-CM | POA: Diagnosis not present

## 2020-07-15 DIAGNOSIS — N189 Chronic kidney disease, unspecified: Secondary | ICD-10-CM | POA: Diagnosis not present

## 2020-07-15 DIAGNOSIS — Z79899 Other long term (current) drug therapy: Secondary | ICD-10-CM | POA: Insufficient documentation

## 2020-07-15 LAB — COMPREHENSIVE METABOLIC PANEL
ALT: 43 U/L (ref 0–44)
AST: 42 U/L — ABNORMAL HIGH (ref 15–41)
Albumin: 4 g/dL (ref 3.5–5.0)
Alkaline Phosphatase: 57 U/L (ref 38–126)
Anion gap: 10 (ref 5–15)
BUN: 22 mg/dL (ref 8–23)
CO2: 28 mmol/L (ref 22–32)
Calcium: 9.4 mg/dL (ref 8.9–10.3)
Chloride: 102 mmol/L (ref 98–111)
Creatinine, Ser: 1.23 mg/dL (ref 0.61–1.24)
GFR calc non Af Amer: >60 mL/min (ref >60)
Glucose, Bld: 104 mg/dL — ABNORMAL HIGH (ref 70–99)
Potassium: 4.2 mmol/L (ref 3.5–5.1)
Sodium: 140 mmol/L (ref 135–145)
Total Bilirubin: 0.7 mg/dL (ref 0.3–1.2)
Total Protein: 7.7 g/dL (ref 6.5–8.1)

## 2020-07-15 LAB — LIPASE, BLOOD: Lipase: 46 U/L (ref 11–51)

## 2020-07-15 LAB — CBC
HCT: 32.4 % — ABNORMAL LOW (ref 39.0–52.0)
Hemoglobin: 10.3 g/dL — ABNORMAL LOW (ref 13.0–17.0)
MCH: 30.4 pg (ref 26.0–34.0)
MCHC: 31.8 g/dL (ref 30.0–36.0)
MCV: 95.6 fL (ref 80.0–100.0)
Platelets: 165 10*3/uL (ref 150–400)
RBC: 3.39 MIL/uL — ABNORMAL LOW (ref 4.22–5.81)
RDW: 14.7 % (ref 11.5–15.5)
WBC: 3.1 10*3/uL — ABNORMAL LOW (ref 4.0–10.5)
nRBC: 0 % (ref 0.0–0.2)

## 2020-07-15 MED ORDER — MINERAL OIL RE ENEM
1.0000 | ENEMA | Freq: Once | RECTAL | Status: DC
  Filled 2020-07-15: qty 1

## 2020-07-15 MED ORDER — LEVOTHYROXINE SODIUM 25 MCG PO TABS: 125.0000 ug | ORAL_TABLET | Freq: Every day | ORAL | Status: DC

## 2020-07-15 MED ORDER — POLYETHYLENE GLYCOL 3350 17 G PO PACK: 17.0000 g | PACK | Freq: Two times a day (BID) | ORAL | Status: DC

## 2020-07-15 MED ORDER — NETARSUDIL DIMESYLATE 0.02 % OP SOLN: 1.0000 [drp] | Freq: Every day | OPHTHALMIC | Status: DC

## 2020-07-15 MED ORDER — SENNOSIDES-DOCUSATE SODIUM 8.6-50 MG PO TABS: 2.0000 | ORAL_TABLET | Freq: Two times a day (BID) | ORAL | Status: DC

## 2020-07-15 MED ORDER — ONDANSETRON HCL 4 MG/2ML IJ SOLN: 4.0000 mg | Freq: Four times a day (QID) | INTRAMUSCULAR | Status: DC | PRN

## 2020-07-15 MED ORDER — ENOXAPARIN SODIUM 40 MG/0.4ML ~~LOC~~ SOLN: 40.0000 mg | SUBCUTANEOUS | Status: DC

## 2020-07-15 MED ORDER — SORBITOL 70 % SOLN: 960.0000 mL | TOPICAL_OIL | Freq: Once | ORAL | Status: DC | PRN

## 2020-07-15 MED ORDER — POLYETHYLENE GLYCOL 3350 17 G PO PACK: 17.0000 g | PACK | Freq: Every day | ORAL | Status: DC

## 2020-07-15 NOTE — ED Notes (Signed)
RN went in to meet w/ pt and visitor w/ the sign language interpreter.  Pt acted if he did not know he was going to be admitted.  He only wants the enema and then wants to go home.  RN explained the importance of the bowel prep but wants to go home.  Provider paged.

## 2020-07-15 NOTE — ED Notes (Addendum)
[facility] bedside w/ RN and interpretor.  Provider wants to make sure pt understands current conditions, options and consequences if he leaves.

## 2020-07-15 NOTE — ED Triage Notes (Signed)
Patient arrives to ED with complaints of abdominal pain, distention, and constipation. Has been here for same in past. Pt lives in hotel with partner and does not take prescribed meds. Pt is also deaf and blind.

## 2020-07-15 NOTE — H&P (Addendum)
History and Physical  Martin [name] XTK:[id number] DOB: [birth year] DOA: 07/15/2020  Referring physician: Doretha Imus, EDP  PCP: [facility], [facility]  Outpatient Specialists: GI Patient coming from: Home  Chief Complaint: Abdominal pain and constipation   HPI: Martin [name] is a 64 y.o. 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  Associated with difficulty in passing stools.  History is obtained from the patient and his wife at bedside with the help of an interpreter in the ED.  No nausea or vomiting.  No changes in his diet.  No fevers at home.  Had a similar admission in August 2021 for similar complaints in which he received multiple bowel regimens.  Was asked to follow-up with GI.  Work-up in the ED revealed pseudoobstruction on CT scan.  GI consulted by EDP and recommended admission for possible disimpaction if patient does not respond to bowel regimen.  TR[facility] asked to admit.  ED Course: Afebrile, abdominal pain moderately distended, lab studies remarkable for WBC 3.1K, CT scan without contrast showing unchanged marked gaseous distention of colon, consistent with patient's history of pseudoobstruction.  Unchanged small ascites in the left upper quadrant and trace bilateral pleural effusions.  Aortic atherosclerosis.  Review of Systems: Review of systems as noted in the HPI. All other systems reviewed and are negative.   Past Medical History:  Diagnosis Date  . Chronic constipation   . Deaf   . Gout   . Hypothyroidism   . Legally blind   . Ogilvie's syndrome   . Pyogenic arthritis of right knee joint [facility])    Past Surgical History:  Procedure Laterality Date  . EYE SURGERY Right    "relieved fluid pressure and cleaned it out"  . KNEE ARTHROSCOPY Right 12/22/2015   Procedure: ARTHROSCOPY WASHOUT OF SEPTIC RIGHT KNEE;   Surgeon: Sheral Apley, MD;  Location: [facility] OR;  Service: Orthopedics;  Laterality: Right;  . KNEE SURGERY Left 2015/2016    Social History:  reports that he has never smoked. He has never used smokeless tobacco. He reports that he does not drink alcohol and does not use drugs.   Allergies  Allergen Reactions  . Ivp Dye [Iodinated Diagnostic Agents] Other (See Comments)    Pt reports he has seizures.    Family History  Family history unknown: Yes      Prior to Admission medications   Medication Sig Start Date End Date Taking? Authorizing Provider  bimatoprost (LUMIGAN) 0.01 % SOLN Place 1 drop into both eyes at bedtime. 04/05/17   Vivianne Master, PA-C  diclofenac (VOLTAREN) 75 MG EC tablet Take 75 mg by mouth.    [provider]  dorzolamide-timolol (COSOPT) 22.3-6.8 MG/ML ophthalmic solution Place 1 drop into both eyes 2 (two) times daily. 04/05/17   Vivianne Master, PA-C  GABAPENTIN PO Take by mouth.    [provider]  levothyroxine (SYNTHROID) 125 MCG tablet Take 1 tablet (125 mcg total) by mouth daily before breakfast. 06/06/20   Dorcas Carrow, MD  Netarsudil Dimesylate (RHOPRESSA) 0.02 % SOLN Place 1 drop into both eyes at bedtime.    [provider]  polyethylene glycol powder (GLYCOLAX/MIRALAX) 17 GM/SCOOP powder Take 17 g by mouth 2 (two) times daily. 06/06/20   Dorcas Carrow, MD    Physical Exam: BP 132/75   Pulse (!) 45   Temp 97.8 F (36.6 C) (Oral)  Resp 18   SpO2 100%   . General: 64 y.o. year-old male well developed well nourished in no acute distress.  Alert and interactive using sign language. . Cardiovascular: Irregular rate and rhythm with no rubs or gallops.  No thyromegaly or JVD noted.  No lower extremity edema. 2/4 pulses in all 4 extremities. Marland Kitchen Respiratory: Clear to auscultation with no wheezes or rales. Good inspiratory effort. . Abdomen: Distended with hypoactive bowel sounds.   . Muskuloskeletal: No cyanosis, clubbing or  edema noted bilaterally . Neuro: CN II-XII intact, strength, sensation, reflexes . Skin: No ulcerative lesions noted or rashes . Psychiatry: Judgement and insight appear normal. Mood is appropriate for condition and setting          Labs on Admission:  Basic Metabolic Panel: Recent Labs  Lab 07/15/20 1500  NA 140  K 4.2  CL 102  CO2 28  GLUCOSE 104*  BUN 22  CREATININE 1.23  CALCIUM 9.4   Liver Function Tests: Recent Labs  Lab 07/15/20 1500  AST 42*  ALT 43  ALKPHOS 57  BILITOT 0.7  PROT 7.7  ALBUMIN 4.0   Recent Labs  Lab 07/15/20 1500  LIPASE 46   No results for input(s): AMMONIA in the last 168 hours. CBC: Recent Labs  Lab 07/15/20 1500  WBC 3.1*  HGB 10.3*  HCT 32.4*  MCV 95.6  PLT 165   Cardiac Enzymes: No results for input(s): CKTOTAL, CKMB, CKMBINDEX, TROPONINI in the last 168 hours.  BNP (last 3 results) No results for input(s): BNP in the last 8760 hours.  ProBNP (last 3 results) No results for input(s): PROBNP in the last 8760 hours.  CBG: No results for input(s): GLUCAP in the last 168 hours.  Radiological Exams on Admission: CT Abdomen Pelvis Wo Contrast  Result Date: 07/15/2020 CLINICAL DATA:  Abdominal pain and distension. EXAM: CT ABDOMEN AND PELVIS WITHOUT CONTRAST TECHNIQUE: Multidetector CT imaging of the abdomen and pelvis was performed following the standard protocol without IV contrast. COMPARISON:  Abdominal x-ray dated June 03, 2020. CT abdomen pelvis dated May 30, 2020. FINDINGS: Lower chest: Unchanged trace bilateral pleural effusions. Hepatobiliary: No focal liver abnormality is seen. Mass effect on the liver due to markedly dilated colon is unchanged. No gallstones, gallbladder wall thickening, or biliary dilatation. Pancreas: Unremarkable. No pancreatic ductal dilatation or surrounding inflammatory changes. Spleen: Normal in size without focal abnormality. Adrenals/Urinary Tract: Adrenal glands are unremarkable. Kidneys  are normal, without renal calculi, focal lesion, or hydronephrosis. Bladder is unremarkable. Stomach/Bowel: Diffuse marked gaseous distention of colon is again noted, similar to prior study. Large stool ball in the rectosigmoid colon. The stomach and small bowel are unremarkable apart from mass effect from the dilated colon. Small hiatal hernia. Vascular/Lymphatic: Aortic atherosclerosis. No enlarged abdominal or pelvic lymph nodes. Reproductive: Prostate is unremarkable. Other: Unchanged small ascites in the left upper quadrant beneath the diaphragm. No pneumoperitoneum. Musculoskeletal: No acute or significant osseous findings. 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 IMPRESSION: 1. Unchanged marked gaseous distention of colon, consistent with patient's history of pseudo-obstruction. 2. Unchanged small ascites in the left upper quadrant and trace bilateral pleural effusions. 3. Aortic Atherosclerosis (ICD10-I70.0). Electronically Signed   By: Obie Dredge M.D.   On: 07/15/2020 15:59    EKG: I independently viewed the EKG done and my findings are as followed:  Afib with slow ventricular response 48.  Nonspecific ST-T changes Assessment/Plan Present on Admission: . Abdominal pain  Active Problems:   Abdominal  pain  Abdominal pain, likely secondary to pseudoobstruction Presented with abdominal pain, distention, and constipation of 2 days duration CT abdomen and pelvis without contrast consistent with pseudoobstruction GI consulted by EDP recommended admission for possible disimpaction if bowel regimen does not resolve constipation Mineral oil enema ordered in the ED, continue MiraLAX started in the ED Add Senokot 2 tablet twice daily, MiraLAX twice daily Smog enema as needed x1 dose.  Bilateral pleural effusions without hypoxia Hold off IV fluid Provide incentive spirometer O2 saturation 95 to 100% on room air  Leukopenia, unclear etiology Presented with WBC  3.1K Observe and repeat CBC in the morning  Chronic? Afib with slow ventricular response Seen on previous EKG Not on any home OAC  Hypothyroidism Obtain TSH Resume home levothyroxine  Legal blindness/deafness History obtained using an interpreter in the ED   CKD 2 Renal function appears to be at his baseline creatinine 1.2 with GFR greater than 60 Avoid nephrotoxins Monitor urine output  DVT prophylaxis: Subcu Lovenox daily  Code Status: Full code per the patient and his wife at bedside.  Family Communication: His wife at bedside.  Disposition Plan: Admit to MedSurg  Consults called: GI consulted by EDP  Admission status: Observation status.   Status is: Observation    Dispo:  Patient From: Home  Planned Disposition: Home  Expected discharge date: 07/16/20  Medically stable for discharge: No ongoing management of constipation and pseudoobstruction.   Darlin Drop MD [facility] Pager [phone]  If 7PM-7AM, please contact night-coverage [email or web address] Password [facility]  07/15/2020, 6:21 PM

## 2020-07-15 NOTE — ED Notes (Signed)
Sign Language Interpreter called @ 1337-per Clydie Braun, RN called by Marylene Land

## 2020-07-15 NOTE — ED Notes (Signed)
Kelly(Sign Language Inter)called/ The Inter. assigned will be arriving by 1540.

## 2020-07-15 NOTE — Progress Notes (Addendum)
HOSPITAL MEDICINE OVERNIGHT EVENT NOTE    Went to evaluate the patient at the bedside due to him communicating his desire to leave AGAINST MEDICAL ADVICE.  A sign language interpreter was used.  Chart reviewed.  I advised the patient concerning his current condition and advised that it be in his best interest to remain hospitalized and receive the treatments previously ordered for him including enemas as well as pending gastroenterology consultation.  Patient has communicated his understanding and his continued desire to be discharged home despite the potential deterioration in his condition.  Wife is at bedside and patient seems to maintain capacity to make his own medical decisions.  Patient is agreeable to sign all AMA paperwork.  Marinda Elk  MD [facility]

## 2020-07-15 NOTE — ED Provider Notes (Signed)
[facility] EMERGENCY DEPARTMENT Provider Note   CSN: [id number] Arrival date & time: 07/15/20  1138     History Chief Complaint  Patient presents with  . Abdominal Pain    Martin [name] is a 64 y.o. male.  HPI HPI was collected from patient's wife who is at bedside and translator was used.   Patient with significant medical history of chronic constipation, deaf, legally blind, ogilvia syndrome presents to the emergency department with chief complaint of worsening abdominal pain and constipation x2-day.  Patient's wife states patient has had decreased appetite over the last 2 days as well as worsening left lower abdominal pain which also radiates into his right side.  She states he has had extreme difficulty trying to pass a stool.  She states he passed a very small stool 2 days ago but has unable to have any more bowel movements.  She denies nausea, vomiting, fever, chills, bloody stools or dark tarry stools.  She states patient has been tolerating p.o. without difficulty but noted that he is not been eating as much lately.  She states patient has not been taking his laxatives but had no complaints up until 2 days ago.  Denies any alleviating factors at this time.  After reviewing patient's notes he was seen here on 08/20 for similar complaint, he was admitted for ogilvia syndrome treated with multiple bowel regimens including laxatives, enemas, manual stool discompartment.  He was then discharged home instructed to continue with MiraLAX 17 g daily and follow-up with GI.  Patient denies headache, fever, chills, shortness of breath, chest pain, nausea, vomiting, diarrhea, dysuria, worsening pedal edema.  Past Medical History:  Diagnosis Date  . Chronic constipation   . Deaf   . Gout   . Hypothyroidism   . Legally blind   . Ogilvie's syndrome   . Pyogenic arthritis of right knee joint [facility])     Patient Active Problem List   Diagnosis Date Noted  . Abdominal distension  06/04/2020  . Ogilvie's syndrome   . Leukopenia   . Thrombocytopenia (HCC)   . CKD (chronic kidney disease)   . Distended abdomen   . Gout   . Adjustment disorder with mixed anxiety and depressed mood 03/22/2017  . Lethargy 03/21/2017  . Acute encephalopathy   . FTT (failure to thrive) in adult   . Protein-calorie malnutrition, severe 12/23/2015  . Septic joint (HCC) 12/21/2015  . Chronic pain syndrome 12/21/2015  . Legally blind 12/21/2015  . Deaf 12/21/2015  . Hypothyroidism 12/21/2015  . Swelling of left hand 12/21/2015  . Chronic constipation     Past Surgical History:  Procedure Laterality Date  . EYE SURGERY Right    "relieved fluid pressure and cleaned it out"  . KNEE ARTHROSCOPY Right 12/22/2015   Procedure: ARTHROSCOPY WASHOUT OF SEPTIC RIGHT KNEE;  Surgeon: Sheral Apley, MD;  Location: [facility] OR;  Service: Orthopedics;  Laterality: Right;  . KNEE SURGERY Left 2015/2016       Family History  Family history unknown: Yes    Social History   Tobacco Use  . Smoking status: Never Smoker  . Smokeless tobacco: Never Used  Substance Use Topics  . Alcohol use: No  . Drug use: No    Home Medications Prior to Admission medications   Medication Sig Start Date End Date Taking? Authorizing Provider  bimatoprost (LUMIGAN) 0.01 % SOLN Place 1 drop into both eyes at bedtime. 04/05/17   Vivianne Master, PA-C  diclofenac (VOLTAREN) 75  MG EC tablet Take 75 mg by mouth.    [provider]  dorzolamide-timolol (COSOPT) 22.3-6.8 MG/ML ophthalmic solution Place 1 drop into both eyes 2 (two) times daily. 04/05/17   Vivianne Master, PA-C  GABAPENTIN PO Take by mouth.    [provider]  levothyroxine (SYNTHROID) 125 MCG tablet Take 1 tablet (125 mcg total) by mouth daily before breakfast. 06/06/20   Dorcas Carrow, MD  Netarsudil Dimesylate (RHOPRESSA) 0.02 % SOLN Place 1 drop into both eyes at bedtime.    [provider]  polyethylene glycol powder  (GLYCOLAX/MIRALAX) 17 GM/SCOOP powder Take 17 g by mouth 2 (two) times daily. 06/06/20   Dorcas Carrow, MD    Allergies    Ivp dye [iodinated diagnostic agents]  Review of Systems   Review of Systems  Constitutional: Positive for appetite change. Negative for chills and fever.  HENT: Negative for congestion, tinnitus, trouble swallowing and voice change.   Eyes: Negative for visual disturbance.  Respiratory: Negative for cough and shortness of breath.   Cardiovascular: Negative for chest pain.  Gastrointestinal: Positive for abdominal distention, abdominal pain and constipation. Negative for blood in stool, diarrhea, nausea, rectal pain and vomiting.  Genitourinary: Negative for enuresis, flank pain, frequency and genital sores.  Musculoskeletal: Negative for back pain.  Skin: Negative for rash.  Neurological: Negative for dizziness, light-headedness and headaches.  Hematological: Does not bruise/bleed easily.    Physical Exam Updated Vital Signs BP 132/75   Pulse (!) 45   Temp 97.8 F (36.6 C) (Oral)   Resp 18   SpO2 100%   Physical Exam Vitals and nursing note reviewed.  Constitutional:      General: He is not in acute distress.    Appearance: He is ill-appearing.  HENT:     Head: Normocephalic and atraumatic.     Nose: No congestion.     Mouth/Throat:     Mouth: Mucous membranes are moist.     Pharynx: Oropharynx is clear.  Eyes:     General: No scleral icterus. Cardiovascular:     Rate and Rhythm: Normal rate and regular rhythm.     Pulses: Normal pulses.     Heart sounds: No murmur heard.  No friction rub. No gallop.   Pulmonary:     Effort: No respiratory distress.     Breath sounds: No wheezing, rhonchi or rales.  Abdominal:     General: There is distension.     Palpations: Abdomen is soft. There is no mass.     Tenderness: There is abdominal tenderness. There is no right CVA tenderness, left CVA tenderness or guarding.     Comments: Patient's abdomen was  visualized, it was distended, normoactive bowel sounds, tympanic to percussion, diffuse tenderness, no acute abdomen, negative peritoneal sign.  Musculoskeletal:        General: No swelling or tenderness.     Right lower leg: No edema.     Left lower leg: No edema.  Skin:    General: Skin is warm and dry.     Capillary Refill: Capillary refill takes less than 2 seconds.     Findings: No rash.  Neurological:     Mental Status: He is alert.  Psychiatric:        Mood and Affect: Mood normal.     ED Results / Procedures / Treatments   Labs (all labs ordered are listed, but only abnormal results are displayed) Labs Reviewed  COMPREHENSIVE METABOLIC PANEL - Abnormal; Notable for the  following components:      Result Value   Glucose, Bld 104 (*)    AST 42 (*)    All other components within normal limits  CBC - Abnormal; Notable for the following components:   WBC 3.1 (*)    RBC 3.39 (*)    Hemoglobin 10.3 (*)    HCT 32.4 (*)    All other components within normal limits  LIPASE, BLOOD  URINALYSIS, ROUTINE W REFLEX MICROSCOPIC    EKG EKG Interpretation  Date/Time:  Wednesday July 15 2020 15:13:29 EDT Ventricular Rate:  48 PR Interval:    QRS Duration: 147 QT Interval:  485 QTC Calculation: 434 R Axis:   -85 Text Interpretation: Junctional rhythm Artifact T wave abnormality Abnormal ECG Confirmed by Gerhard Munch [id number]) on 07/15/2020 3:51:38 PM   Radiology CT Abdomen Pelvis Wo Contrast  Result Date: 07/15/2020 CLINICAL DATA:  Abdominal pain and distension. EXAM: CT ABDOMEN AND PELVIS WITHOUT CONTRAST TECHNIQUE: Multidetector CT imaging of the abdomen and pelvis was performed following the standard protocol without IV contrast. COMPARISON:  Abdominal x-ray dated June 03, 2020. CT abdomen pelvis dated May 30, 2020. FINDINGS: Lower chest: Unchanged trace bilateral pleural effusions. Hepatobiliary: No focal liver abnormality is seen. Mass effect on the liver due to  markedly dilated colon is unchanged. No gallstones, gallbladder wall thickening, or biliary dilatation. Pancreas: Unremarkable. No pancreatic ductal dilatation or surrounding inflammatory changes. Spleen: Normal in size without focal abnormality. Adrenals/Urinary Tract: Adrenal glands are unremarkable. Kidneys are normal, without renal calculi, focal lesion, or hydronephrosis. Bladder is unremarkable. Stomach/Bowel: Diffuse marked gaseous distention of colon is again noted, similar to prior study. Large stool ball in the rectosigmoid colon. The stomach and small bowel are unremarkable apart from mass effect from the dilated colon. Small hiatal hernia. Vascular/Lymphatic: Aortic atherosclerosis. No enlarged abdominal or pelvic lymph nodes. Reproductive: Prostate is unremarkable. Other: Unchanged small ascites in the left upper quadrant beneath the diaphragm. No pneumoperitoneum. Musculoskeletal: No acute or significant osseous findings. 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 IMPRESSION: 1. Unchanged marked gaseous distention of colon, consistent with patient's history of pseudo-obstruction. 2. Unchanged small ascites in the left upper quadrant and trace bilateral pleural effusions. 3. Aortic Atherosclerosis (ICD10-I70.0). Electronically Signed   By: Obie Dredge M.D.   On: 07/15/2020 15:59    Procedures Procedures (including critical care time)  Medications Ordered in ED Medications  mineral oil enema 1 enema (has no administration in time range)  polyethylene glycol (MIRALAX / GLYCOLAX) packet 17 g (has no administration in time range)    ED Course  I have reviewed the triage vital signs and the nursing notes.  Pertinent labs & imaging results that were available during my care of the patient were reviewed by me and considered in my medical decision making (see chart for details).    MDM Rules/Calculators/A&P                          I have personally reviewed all  imaging, labs and have interpreted them.  Patient presents with abdominal pain and constipation x2 days.  Patient was alert, did not appear to be in acute distress, vital signs significant for tachycardia.  Will obtain screening labs, and order abdominal CT for further evaluation.  CMP showing no electrolyte abnormalities, no signs of metabolic acidosis, hyperglycemia of 104, no AKI, no anion gap noted.  Lipase 46, CBC negative for leukocytosis, normocytic anemia appears to be  a baseline for patient. CT abdomen pelvis shows unchanged marked gaseous distention of the colon consistent with patient's history of pseudoobstruction, unchanged small ascites in the left upper quadrant trace bilateral pleural effusions, aortic sclerosis noted. EKG was sinus bradycardia without signs of ischemia no ST elevation depression noted.  Due to pseudoobstruction and constipation will consult with [facility] for further evaluation.  Spoke with Dr. Ewing SchleinMagod and he agrees patient should be admitted for further evaluation.  He recommends an enema, MiraLAX and have a low threshold for an NG tube for nausea or vomiting.  He will come and evaluate patient tomorrow morning.  Will consult with hospitalist team for admission due to pseudoobstruction.  Spoke with Dr. Margo AyeHall who has agreed to accept the patient.  She will come and evaluate patient for further management.  I have low suspicion for intra-abdominal abnormality requiring surgical intervention as patient denies nausea, vomiting, tolerating p.o., no acute abdomen noted on exam, CT scan does not show acute finding.  Low suspicion patient will require NG tube at this time as he denies nausea or vomiting, tolerating p.o.  Low suspicion for systemic infection as patient is nontoxic-appearing, vital signs reassuring, no obvious source infection on exam.  I suspect initial tachycardia was secondary to pain as tachycardia and resolved once pain was controlled.  Patient has noted  bradycardia, no signs of heart block or signs of hypoperfusion.  I suspect patient's abdominal pain and distention is secondary to his pseudoobstruction's, I anticipate patient will require bowel regimen including MiraLAX, enemas, and discompartment.   Patient was evaluated, vital signs remained stable and patient care will be transferred to the hospital team for further management. Final Clinical Impression(s) / ED Diagnoses Final diagnoses:  Pseudoobstruction of colon    Rx / DC Orders ED Discharge Orders    None       Carroll SageFaulkner, Kerah Hardebeck J, PA-C 07/15/20 1801    Gerhard MunchLockwood, Robert, MD 07/20/20 [phone]

## 2020-07-15 NOTE — ED Notes (Signed)
Pt given a cab voucher to get home.

## 2020-08-23 ENCOUNTER — Telehealth: Payer: Self-pay | Admitting: Physician Assistant

## 2020-08-23 NOTE — Telephone Encounter (Signed)
I connected by phone with [name] and/or patient's caregiver on 08/23/2020 at 6:39 PM to discuss the potential vaccination through our Homebound vaccination initiative.   Prevaccination Checklist for COVID-19 Vaccines  1.  Are you feeling sick today? no  2.  Have you ever received a dose of a COVID-19 vaccine?  yes      If yes, which one? Pfizer, 06/05/20,    3.  Have you ever had an allergic reaction: (This would include a severe reaction [ e.g., anaphylaxis] that required treatment with epinephrine or EpiPen or that caused you to go to the hospital.  It would also include an allergic reaction that occurred within 4 hours that caused hives, swelling, or respiratory distress, including wheezing.) A.  A previous dose of COVID-19 vaccine. no  B.  A vaccine or injectable therapy that contains multiple components, one of which is a COVID-19 vaccine component, but it is not known which component elicited the immediate reaction. no  C.  Are you allergic to polyethylene glycol? no  D. Are you allergic to Polysorbate, which is found in some vaccines, film coated tablets and intravenous steroids?  no   4.  Have you ever had an allergic reaction to another vaccine (other than COVID-19 vaccine) or an injectable medication? (This would include a severe reaction [ e.g., anaphylaxis] that required treatment with epinephrine or EpiPen or that caused you to go to the hospital.  It would also include an allergic reaction that occurred within 4 hours that caused hives, swelling, or respiratory distress, including wheezing.)  no   5.  Have you ever had a severe allergic reaction (e.g., anaphylaxis) to something other than a component of the COVID-19 vaccine, or any vaccine or injectable medication?  This would include food, pet, venom, environmental, or oral medication allergies.  no   6.  Have you received any vaccine in the last 14 days? no   7.  Have you ever had a positive test for COVID-19 or has a doctor  ever told you that you had COVID-19?  no   8.  Have you received passive antibody therapy (monoclonal antibodies or convalescent serum) as a treatment for COVID-19? no   9.  Do you have a weakened immune system caused by something such as HIV infection or cancer or do you take immunosuppressive drugs or therapies?  no   10.  Do you have a bleeding disorder or are you taking a blood thinner? no   11.  Are you pregnant or breast-feeding? no   12.  Do you have dermal fillers? no   __________________   This patient is a 64 y.o. male that meets the FDA criteria to receive homebound vaccination. Patient or parent/caregiver understands they have the option to accept or refuse homebound vaccination.  Patient passed the pre-screening checklist and would like to proceed with homebound vaccination.  Based on questionnaire above, I recommend the patient be observed for 15 minutes.  There are an estimated 0 other household members/caregivers who are also interested in receiving the vaccine. Could not get a clear answer on this. Had to use a sign language interpreter to communicate.    I will send the patient's information to our scheduling team who will reach out to schedule the patient and potential caregiver/family members for homebound vaccination.    Cline Crock 08/23/2020 6:39 PM

## 2020-08-23 NOTE — Telephone Encounter (Signed)
Called to discuss the homebound Covid-19 vaccination initiative with the patient and/or caregiver.   Message left to call back. Patient is reportedly both blind and deaf. I have left a message to call my work cell at [phone] though a sign language answering service.   Cline Crock PA-C  [facility]

## 2020-09-01 ENCOUNTER — Ambulatory Visit: Payer: Self-pay

## 2020-09-22 ENCOUNTER — Ambulatory Visit (INDEPENDENT_AMBULATORY_CARE_PROVIDER_SITE_OTHER): Payer: Medicare Other | Admitting: Primary Care

## 2020-09-22 ENCOUNTER — Ambulatory Visit: Payer: Self-pay

## 2020-12-08 ENCOUNTER — Emergency Department (HOSPITAL_COMMUNITY)
Admission: EM | Admit: 2020-12-08 | Discharge: 2020-12-09 | Disposition: A | Discharge status: Home / Self Care | Payer: Medicare Other | Attending: Emergency Medicine | Admitting: Emergency Medicine

## 2020-12-08 ENCOUNTER — Encounter (HOSPITAL_COMMUNITY): Payer: Self-pay | Admitting: Emergency Medicine

## 2020-12-08 ENCOUNTER — Other Ambulatory Visit: Payer: Self-pay

## 2020-12-08 DIAGNOSIS — R109 Unspecified abdominal pain: Secondary | ICD-10-CM | POA: Diagnosis present

## 2020-12-08 DIAGNOSIS — K5901 Slow transit constipation: Secondary | ICD-10-CM

## 2020-12-08 DIAGNOSIS — Z79899 Other long term (current) drug therapy: Secondary | ICD-10-CM | POA: Insufficient documentation

## 2020-12-08 DIAGNOSIS — E039 Hypothyroidism, unspecified: Secondary | ICD-10-CM | POA: Insufficient documentation

## 2020-12-08 DIAGNOSIS — N189 Chronic kidney disease, unspecified: Secondary | ICD-10-CM | POA: Insufficient documentation

## 2020-12-08 NOTE — ED Triage Notes (Signed)
Patient BIB [facility] from home. Complaint of constipation. Unable to give last bowel movement. Hx of same. VSS. NAD.

## 2020-12-09 ENCOUNTER — Emergency Department (HOSPITAL_COMMUNITY): Payer: Medicare Other

## 2020-12-09 DIAGNOSIS — K5901 Slow transit constipation: Secondary | ICD-10-CM | POA: Diagnosis not present

## 2020-12-09 LAB — CBC WITH DIFFERENTIAL/PLATELET
Abs Immature Granulocytes: 0.02 10*3/uL (ref 0.00–0.07)
Basophils Absolute: 0 10*3/uL (ref 0.0–0.1)
Basophils Relative: 0 %
Eosinophils Absolute: 0.1 10*3/uL (ref 0.0–0.5)
Eosinophils Relative: 2 %
HCT: 36.5 % — ABNORMAL LOW (ref 39.0–52.0)
Hemoglobin: 11.7 g/dL — ABNORMAL LOW (ref 13.0–17.0)
Immature Granulocytes: 0 %
Lymphocytes Relative: 22 %
Lymphs Abs: 1.2 10*3/uL (ref 0.7–4.0)
MCH: 31.3 pg (ref 26.0–34.0)
MCHC: 32.1 g/dL (ref 30.0–36.0)
MCV: 97.6 fL (ref 80.0–100.0)
Monocytes Absolute: 0.3 10*3/uL (ref 0.1–1.0)
Monocytes Relative: 6 %
Neutro Abs: 3.7 10*3/uL (ref 1.7–7.7)
Neutrophils Relative %: 70 %
Platelets: 144 10*3/uL — ABNORMAL LOW (ref 150–400)
RBC: 3.74 MIL/uL — ABNORMAL LOW (ref 4.22–5.81)
RDW: 14.9 % (ref 11.5–15.5)
WBC: 5.4 10*3/uL (ref 4.0–10.5)
nRBC: 0 % (ref 0.0–0.2)

## 2020-12-09 LAB — COMPREHENSIVE METABOLIC PANEL
ALT: 34 U/L (ref 0–44)
AST: 42 U/L — ABNORMAL HIGH (ref 15–41)
Albumin: 4.5 g/dL (ref 3.5–5.0)
Alkaline Phosphatase: 52 U/L (ref 38–126)
Anion gap: 12 (ref 5–15)
BUN: 27 mg/dL — ABNORMAL HIGH (ref 8–23)
CO2: 24 mmol/L (ref 22–32)
Calcium: 10 mg/dL (ref 8.9–10.3)
Chloride: 105 mmol/L (ref 98–111)
Creatinine, Ser: 1.31 mg/dL — ABNORMAL HIGH (ref 0.61–1.24)
GFR, Estimated: >60 mL/min (ref >60)
Glucose, Bld: 90 mg/dL (ref 70–99)
Potassium: 4.5 mmol/L (ref 3.5–5.1)
Sodium: 141 mmol/L (ref 135–145)
Total Bilirubin: 1.1 mg/dL (ref 0.3–1.2)
Total Protein: 8.4 g/dL — ABNORMAL HIGH (ref 6.5–8.1)

## 2020-12-09 LAB — LIPASE, BLOOD: Lipase: 41 U/L (ref 11–51)

## 2020-12-09 MED ORDER — FLEET ENEMA 7-19 GM/118ML RE ENEM
1.0000 | ENEMA | Freq: Once | RECTAL | Status: AC
  Administered 2020-12-09: 1 via RECTAL
  Filled 2020-12-09: qty 1

## 2020-12-09 MED ORDER — LINACLOTIDE 290 MCG PO CAPS: 290.0000 ug | ORAL_CAPSULE | Freq: Every day | ORAL | 0 refills | Status: DC

## 2020-12-09 NOTE — ED Provider Notes (Signed)
[facility] EMERGENCY DEPARTMENT Provider Note   CSN: [id number] Arrival date & time: 12/08/20  1827     History Chief Complaint  Patient presents with  . Constipation    Martin [name] is a 65 y.o. male with a past medical history of Ogilvie syndrome, CKD presenting to the ED with a chief complaint of abdominal pain.   History is provided through visitor at the bedside who is acting as ASL interpreter for online interpreter as patient is deaf and blind.  Patient had a bowel movement today and yesterday but was concerned that it was harder than usual.  Reports having pain throughout his entire abdomen but worse on the right side.  Does not take any medications, has not been taking any laxatives.  Denies any bloody stools.  No vomiting or nausea.  No changes to urination.  States that his abdominal distention is unchanged.  HPI     Past Medical History:  Diagnosis Date  . Chronic constipation   . Deaf   . Gout   . Hypothyroidism   . Legally blind   . Ogilvie's syndrome   . Pyogenic arthritis of right knee joint [facility])     Patient Active Problem List   Diagnosis Date Noted  . Abdominal pain 07/15/2020  . Abdominal distension 06/04/2020  . Ogilvie's syndrome   . Leukopenia   . Thrombocytopenia (HCC)   . CKD (chronic kidney disease)   . Distended abdomen   . Gout   . Adjustment disorder with mixed anxiety and depressed mood 03/22/2017  . Lethargy 03/21/2017  . Acute encephalopathy   . FTT (failure to thrive) in adult   . Protein-calorie malnutrition, severe 12/23/2015  . Septic joint (HCC) 12/21/2015  . Chronic pain syndrome 12/21/2015  . Legally blind 12/21/2015  . Deaf 12/21/2015  . Hypothyroidism 12/21/2015  . Swelling of left hand 12/21/2015  . Chronic constipation     Past Surgical History:  Procedure Laterality Date  . EYE SURGERY Right    "relieved fluid pressure and cleaned it out"  . KNEE ARTHROSCOPY Right 12/22/2015   Procedure:  ARTHROSCOPY WASHOUT OF SEPTIC RIGHT KNEE;  Surgeon: Sheral Apley, MD;  Location: [facility] OR;  Service: Orthopedics;  Laterality: Right;  . KNEE SURGERY Left 2015/2016       Family History  Family history unknown: Yes    Social History   Tobacco Use  . Smoking status: Never Smoker  . Smokeless tobacco: Never Used  Substance Use Topics  . Alcohol use: No  . Drug use: No    Home Medications Prior to Admission medications   Medication Sig Start Date End Date Taking? Authorizing Provider  linaclotide Karlene Einstein) 290 MCG CAPS capsule Take 1 capsule (290 mcg total) by mouth daily before breakfast. 12/09/20  Yes Jaymir Struble, PA-C  bimatoprost (LUMIGAN) 0.01 % SOLN Place 1 drop into both eyes at bedtime. 04/05/17   Vivianne Master, PA-C  diclofenac (VOLTAREN) 75 MG EC tablet Take 75 mg by mouth.    [provider]  dorzolamide-timolol (COSOPT) 22.3-6.8 MG/ML ophthalmic solution Place 1 drop into both eyes 2 (two) times daily. 04/05/17   Vivianne Master, PA-C  GABAPENTIN PO Take by mouth.    [provider]  levothyroxine (SYNTHROID) 125 MCG tablet Take 1 tablet (125 mcg total) by mouth daily before breakfast. Patient not taking: Reported on 07/15/2020 06/06/20   Dorcas Carrow, MD  Netarsudil Dimesylate (RHOPRESSA) 0.02 % SOLN Place 1 drop into both eyes  at bedtime.    [provider]  polyethylene glycol powder (GLYCOLAX/MIRALAX) 17 GM/SCOOP powder Take 17 g by mouth 2 (two) times daily. 06/06/20   Dorcas Carrow, MD    Allergies    Ivp dye [iodinated diagnostic agents]  Review of Systems   Review of Systems  Constitutional: Negative for appetite change, chills and fever.  HENT: Negative for ear pain, rhinorrhea, sneezing and sore throat.   Eyes: Negative for photophobia and visual disturbance.  Respiratory: Negative for cough, chest tightness, shortness of breath and wheezing.   Cardiovascular: Negative for chest pain and palpitations.  Gastrointestinal:  Positive for abdominal pain and constipation. Negative for blood in stool, diarrhea, nausea and vomiting.  Genitourinary: Negative for dysuria, hematuria and urgency.  Musculoskeletal: Negative for myalgias.  Skin: Negative for rash.  Neurological: Negative for dizziness, weakness and light-headedness.    Physical Exam Updated Vital Signs BP 120/90 (BP Location: Right Arm)   Pulse 67   Temp 98.1 F (36.7 C) (Oral)   Resp 16   SpO2 100%   Physical Exam Vitals and nursing note reviewed.  Constitutional:      General: He is not in acute distress.    Appearance: He is well-developed and well-nourished.  HENT:     Head: Normocephalic and atraumatic.     Nose: Nose normal.  Eyes:     General: No scleral icterus.       Right eye: No discharge.        Left eye: No discharge.     Extraocular Movements: EOM normal.     Conjunctiva/sclera: Conjunctivae normal.  Cardiovascular:     Rate and Rhythm: Normal rate and regular rhythm.     Pulses: Intact distal pulses.     Heart sounds: Normal heart sounds. No murmur heard. No friction rub. No gallop.   Pulmonary:     Effort: Pulmonary effort is normal. No respiratory distress.     Breath sounds: Normal breath sounds.  Abdominal:     General: Bowel sounds are normal. There is distension.     Palpations: Abdomen is soft.     Tenderness: There is no abdominal tenderness. There is no guarding.     Comments: Generalized tenderness.  Musculoskeletal:        General: No edema. Normal range of motion.     Cervical back: Normal range of motion and neck supple.  Skin:    General: Skin is warm and dry.     Findings: No rash.  Neurological:     Mental Status: He is alert.     Motor: No abnormal muscle tone.     Coordination: Coordination normal.  Psychiatric:        Mood and Affect: Mood and affect normal.     ED Results / Procedures / Treatments   Labs (all labs ordered are listed, but only abnormal results are displayed) Labs  Reviewed  COMPREHENSIVE METABOLIC PANEL - Abnormal; Notable for the following components:      Result Value   BUN 27 (*)    Creatinine, Ser 1.31 (*)    Total Protein 8.4 (*)    AST 42 (*)    All other components within normal limits  CBC WITH DIFFERENTIAL/PLATELET - Abnormal; Notable for the following components:   RBC 3.74 (*)    Hemoglobin 11.7 (*)    HCT 36.5 (*)    Platelets 144 (*)    All other components within normal limits  LIPASE, BLOOD    EKG  None  Radiology CT ABDOMEN PELVIS WO CONTRAST  Result Date: 12/09/2020 CLINICAL DATA:  RIGHT lower quadrant abdominal pain.  Constipation. EXAM: CT ABDOMEN AND PELVIS WITHOUT CONTRAST TECHNIQUE: Multidetector CT imaging of the abdomen and pelvis was performed following the standard protocol without IV contrast. COMPARISON:  CT 07/16/2019 FINDINGS: Lower chest: Lung bases are clear. Elevation of the LEFT hemidiaphragm by gas-filled colon not changed from prior Hepatobiliary: No focal hepatic lesion on noncontrast exam. Pancreas: Pancreas grossly normal. Spleen: Normal spleen Adrenals/urinary tract: Adrenal glands, kidneys, ureters bladder normal. Stomach/Bowel: Again demonstrated, marked gaseous distension of ascending, transverse, descending and sigmoid colon. There is no twisting of the colon 2 suggest volvulus. Transverse colon measures 8.3 cm compared to 11.2 cm. Descending colon measures 9.4 cm compared to 8.8 cm. There is a moderate volume stool ball in the rectum measuring 6.8 cm. Small amount of formed stool in the ascending colon. Oral contrast is present in the small bowel. Contrast does not progress to the terminal ileum. No evidence of bowel dilatation. Probable normal appendix identified on image 48/coronal/6. No fluid in the RIGHT lower quadrant. Vascular/Lymphatic: Abdominal aorta is normal caliber. No periportal or retroperitoneal adenopathy. No pelvic adenopathy. Reproductive: Prostate unremarkable Other: No intraperitoneal free  air Musculoskeletal: No aggressive osseous lesion. IMPRESSION: 1. Marked gaseous distention of the entire colon not changed from comparison CT 07/15/2020. 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 2. Large stool ball in the rectum. 3. Slow transit of the oral contrast through the small bowel. No bowel obstruction. 4. Normal appendix partially imaged. Electronically Signed   By: Genevive Bi M.D.   On: 12/09/2020 11:31    Procedures Procedures   Medications Ordered in ED Medications  sodium phosphate (FLEET) 7-19 GM/118ML enema 1 enema (1 enema Rectal Given 12/09/20 1338)    ED Course  I have reviewed the triage vital signs and the nursing notes.  Pertinent labs & imaging results that were available during my care of the patient were reviewed by me and considered in my medical decision making (see chart for details).  Clinical Course as of 12/09/20 1430  Wed Dec 09, 2020  5852 Creatinine(!): 1.31 Around baseline. [HK]  0824 WBC: 5.4 [HK]  1313 Attempted to manually disimpact patient as his request.  Unsuccessful.  I have consulted Dr. Bosie Clos who will review patient's chart and call me back with recommendations. [HK]  1322 I was called by Dr. Bosie Clos from [facility].  States that this patient was not seen in the clinic therefore they are unassigned.  Will reconsult [facility] for their recommendations. [HK]  1329 Linzess 290 mcg every day with water No narcotics Per dr Loreta Ave [HK]    Clinical Course User Index [HK] Dietrich Pates, PA-C   MDM Rules/Calculators/A&P                          65 year old male with past medical history of Ogilvie syndrome, CKD presenting to the ED with a chief complaint of abdominal pain.  Reports feeling constipated as well.  Symptoms began prior to arrival.  He has had 2 bowel movements in the past day but is concerned that they do not feel like his typical bowel movements.  He is not currently taking any laxatives.  Denies any  bloody stools, vomiting or changes to urination. History is somewhat limited as two interprets are used for ASL (his wife at bedside and ASL medical interpreter).  He is requesting an  enema.  On exam patient has distention noted.  There is generalized tenderness without any focal tenderness.  He appears in no distress.  Will obtain lab work and CT to evaluate for obstruction.  Lab work including CBC, CMP and lipase unremarkable.  CT of abdomen pelvis shows similar findings related to his chronic pseudoobstruction of the colon.  This is similar to October 2021.  He does have a large stool burden in the rectum.  I attempted to manually disimpact at the bedside without success.  I spoke to Dr. Loreta AveMann from gastroenterology.  She recommends Linzess 290 mcg every day and make sure he is drinking plenty of fluids.  He will need to discontinue any use of narcotics, chart review shows that he is not currently taking any narcotic medications.  He was given an enema here with resounding success.  Patient informed that he will need to start taking the Linzess and to establish care with a GI doctor outpatient.  Patient and wife are agreeable to the plan.  His symptoms have improved here.  Return precautions given.   Patient is hemodynamically stable, in NAD, and able to ambulate in the ED. Evaluation does not show pathology that would require ongoing emergent intervention or inpatient treatment. I explained the diagnosis to the patient. Pain has been managed and has no complaints prior to discharge. Patient is comfortable with above plan and is stable for discharge at this time. All questions were answered prior to disposition. Strict return precautions for returning to the ED were discussed. Encouraged follow up with PCP.   An After Visit Summary was printed and given to the patient.   Portions of this note were generated with Scientist, clinical (histocompatibility and immunogenetics)Dragon dictation software. Dictation errors may occur despite best attempts at  proofreading.  Final Clinical Impression(s) / ED Diagnoses Final diagnoses:  Constipation by delayed colonic transit    Rx / DC Orders ED Discharge Orders         Ordered    linaclotide (LINZESS) 290 MCG CAPS capsule  Daily before breakfast        12/09/20 1400           Dietrich PatesKhatri, Kimothy Kishimoto, PA-C 12/09/20 1430    Tegeler, Canary Brimhristopher J, MD 12/09/20 1434

## 2020-12-09 NOTE — ED Notes (Signed)
Gave pt ginger ale and graham crackers 

## 2020-12-09 NOTE — ED Notes (Signed)
LWBS 

## 2020-12-09 NOTE — ED Notes (Signed)
Pt is back in room with call bell within reach.  CT technician talked with pt and family with the tele interpreter.

## 2020-12-09 NOTE — ED Notes (Signed)
Pt called to be roomed, no response 

## 2020-12-09 NOTE — ED Notes (Signed)
Dr schooler paged to [phone] by Sharlet Salina

## 2020-12-09 NOTE — ED Notes (Signed)
Pt had extra large bowel movement in the bedside commode with streaks of bright red blood. Pt assisted back into bed. Wife at bedside.

## 2020-12-09 NOTE — Discharge Instructions (Addendum)
It is important for you to establish care with a GI doctor outpatient. Call the practice listed below to schedule an appointment. In the meantime take the Linzess as directed by the GI doctor that I spoke to you about your CT scan. Make sure you are drinking plenty of water with this medication. Return to the ER if you start to experience worsening pain, fever, chest pain, shortness of breath or bloody stools.

## 2020-12-09 NOTE — ED Notes (Signed)
Patient transported to CT 

## 2022-08-11 IMAGING — CT CT ABD-PELV W/O CM
2 of 4 series · 15 of 46 positions shown, 17 images · non-contrast
Comparison: CT abdomen and pelvis 01/01/2019.

CLINICAL DATA: Left lower quadrant pain and diarrhea for 4 days.
History of Ogilvie's syndrome.

EXAM:
CT ABDOMEN AND PELVIS WITHOUT CONTRAST
TECHNIQUE: Multidetector CT imaging of the abdomen and pelvis was performed
following the standard protocol without IV contrast.

[Series 3: a/p w/o 5mm (person_name) · axial · non-contrast · 0.70mm/px · z∈[-377,+93]mm · 12 of 104 slices shown, 14 images]
[im 5/104  soft-tissue]
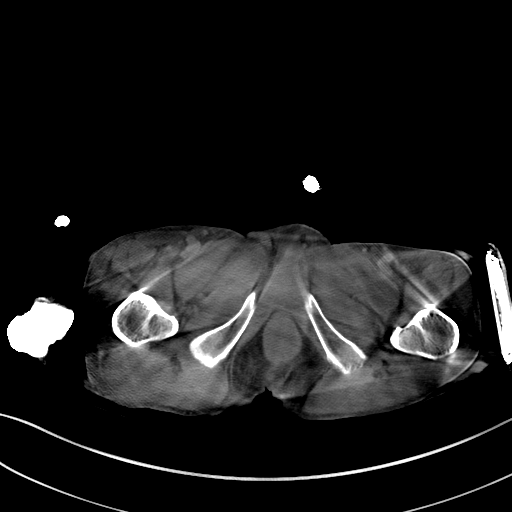
[im 5/104  bone]
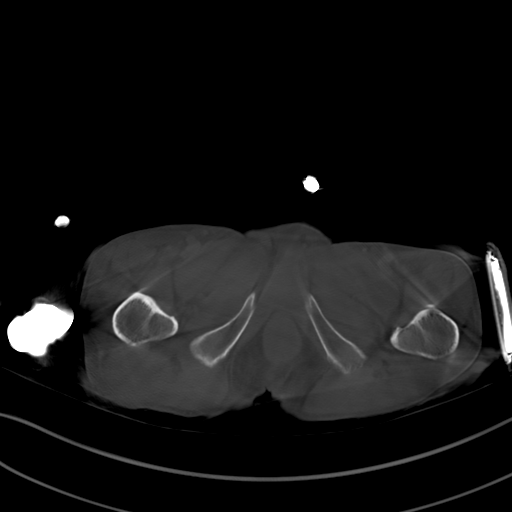
[im 14/104  soft-tissue]
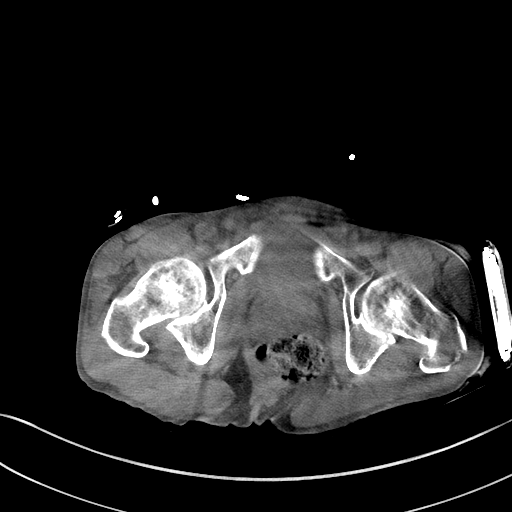
[im 23/104  soft-tissue]
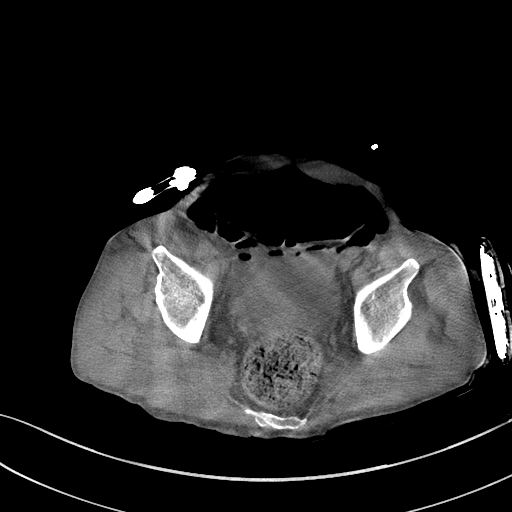
[im 32/104  soft-tissue]
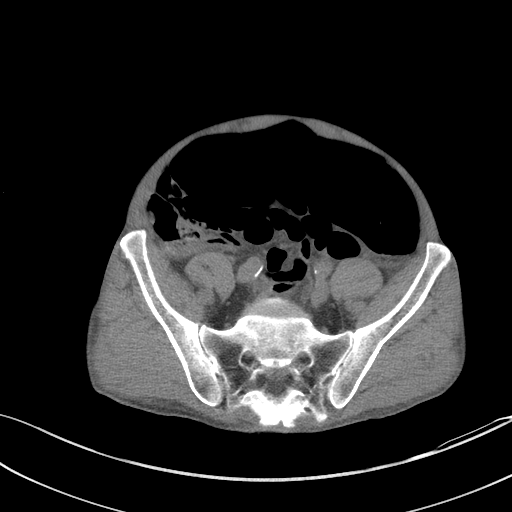
[im 41/104  soft-tissue]
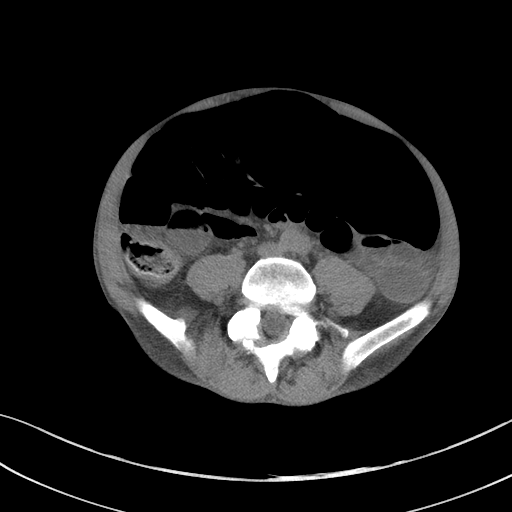
[im 50/104  soft-tissue]
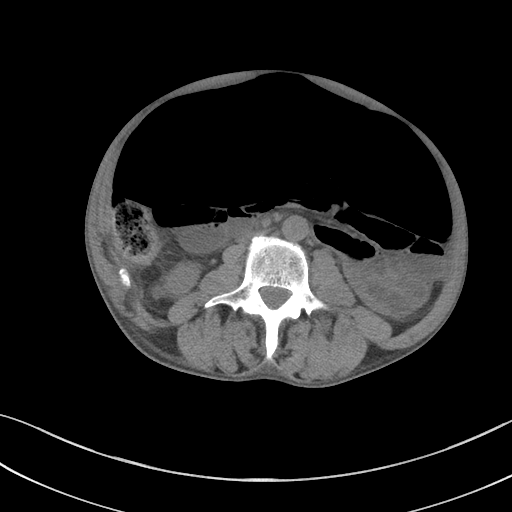
[im 54/104  soft-tissue]
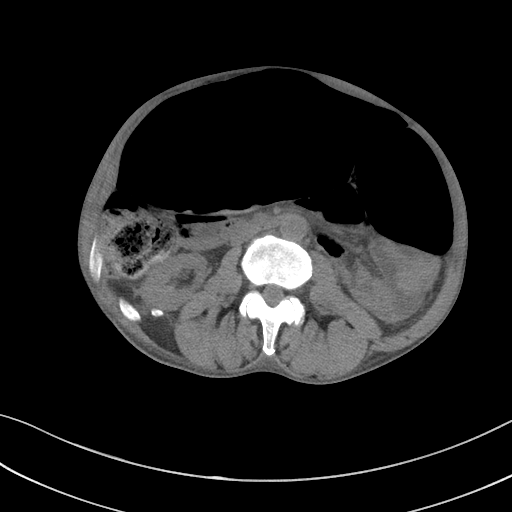
[im 63/104  soft-tissue]
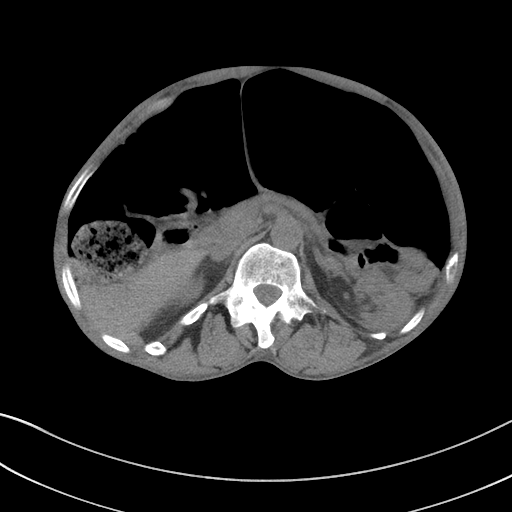
[im 72/104  soft-tissue]
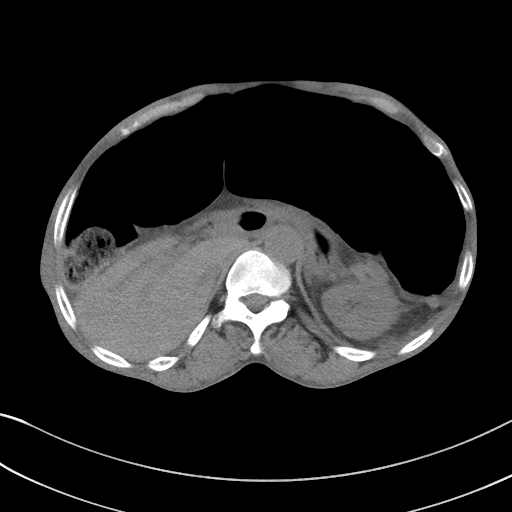
[im 72/104  bone]
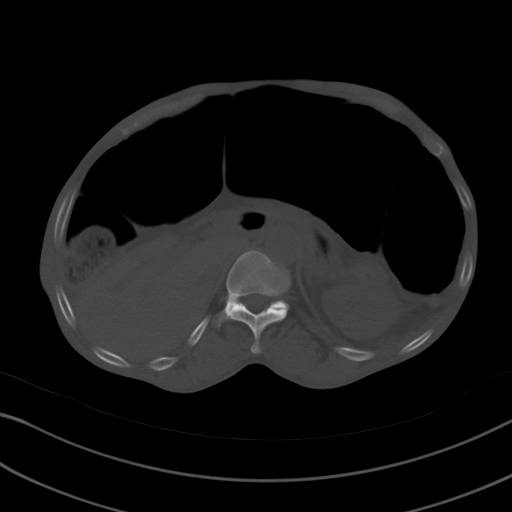
[im 81/104  soft-tissue]
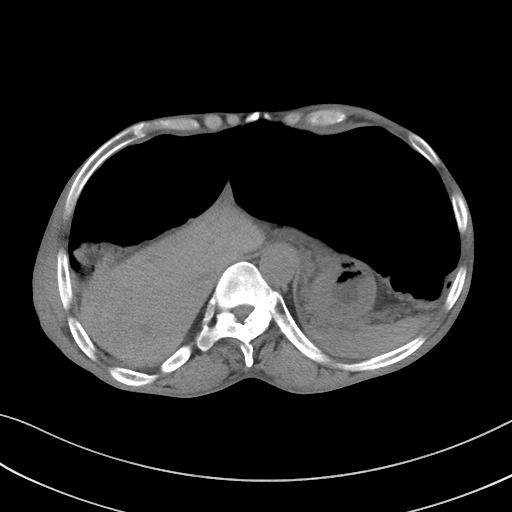
[im 90/104  soft-tissue]
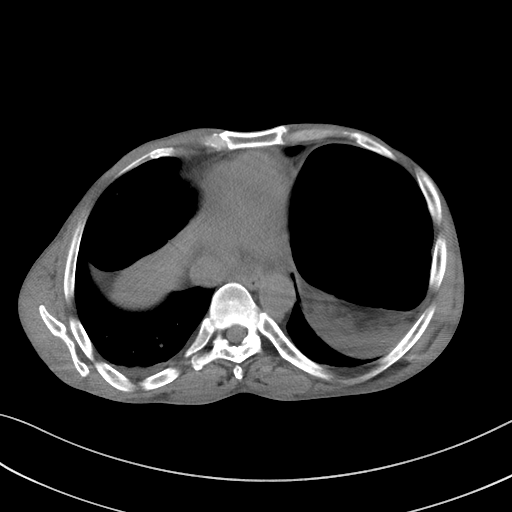
[im 99/104  soft-tissue]
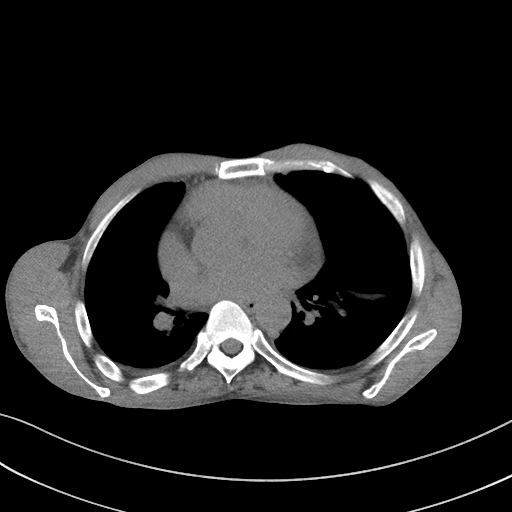

[Series 6: a/p w/o cor · coronal · non-contrast · 0.61mm/px · 3 of 145 slices shown]
[im 49/145  soft-tissue]
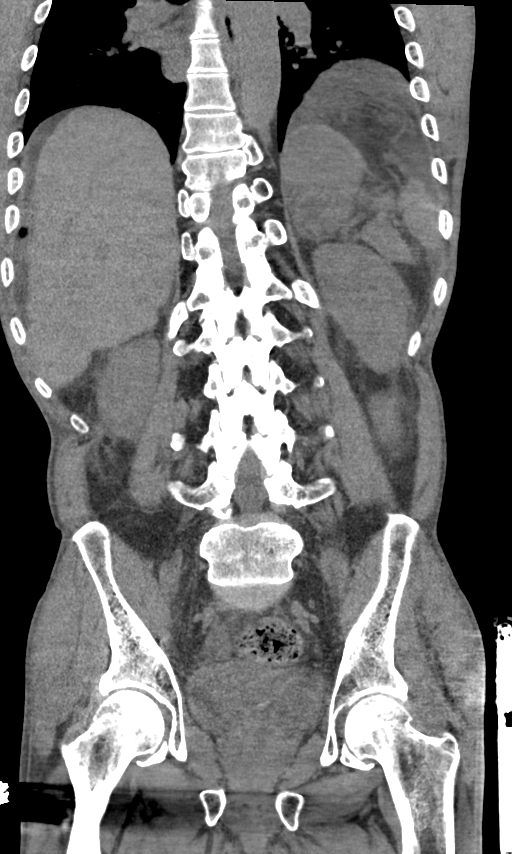
[im 65/145  soft-tissue]
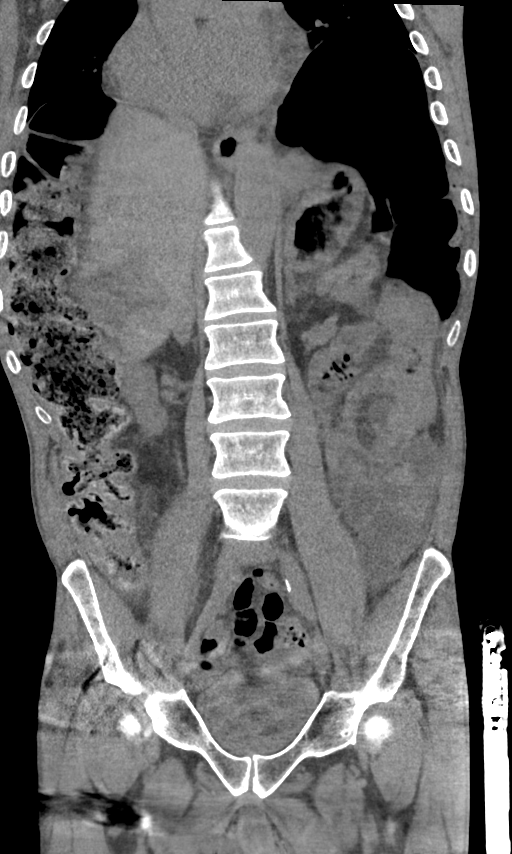
[im 81/145  soft-tissue]
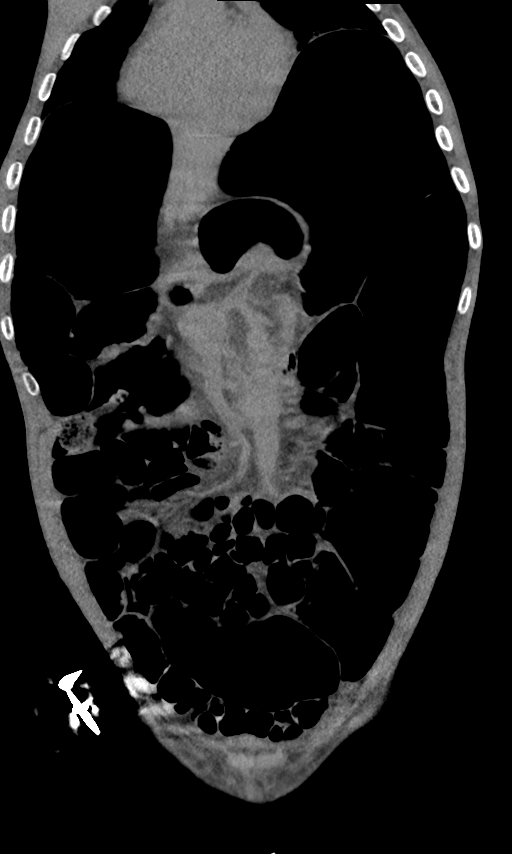

[15 of 46 positions shown; findings below may reference images not displayed]

FINDINGS: Lower chest: There are very small bilateral pleural effusions. No
pericardial effusion. Mild bibasilar atelectasis noted.

Hepatobiliary: No focal liver abnormality is seen. No gallstones,
gallbladder wall thickening, or biliary dilatation. Mass effect on
the liver due to markedly dilated colon is unchanged.

Pancreas: Unremarkable. No pancreatic ductal dilatation or
surrounding inflammatory changes.

Spleen: Normal in size without focal abnormality.

Adrenals/Urinary Tract: Adrenal glands are unremarkable. Kidneys are
normal, without renal calculi, focal lesion, or hydronephrosis.
Bladder is unremarkable.

Stomach/Bowel: Massive gaseous distension of the colon throughout is
again seen. There is a large stool ball in the rectum. Visualization
is limited due to distention of the colon but no evidence of
small-bowel obstruction is identified. There is mass effect on the
stomach by large bowel. The stomach is otherwise unremarkable.
Structure likely representing the appendix is unremarkable.

Vascular/Lymphatic: Aortic atherosclerosis. No enlarged abdominal or
pelvic lymph nodes.

Reproductive: Prostate is unremarkable.

Other: None.

Musculoskeletal: No acute or focal bony abnormality.
IMPRESSION: No change in massive distention of the colon most consistent with
the patient's history of Ogilvie's syndrome.

Very small bilateral pleural effusions are unchanged since the prior
CT.

Large volume of stool in the rectosigmoid colon.

## 2022-08-15 IMAGING — CR DG ABDOMEN 1V
1 series · 1 of 1 positions shown · non-contrast
Comparison: Abdominal CT 05/30/2020

CLINICAL DATA: Abdominal pain and distension. History of [place]
syndrome and colonic constipation. Rule out small bowel obstruction.

EXAM:
ABDOMEN - 1 VIEW

[abdomen kub]
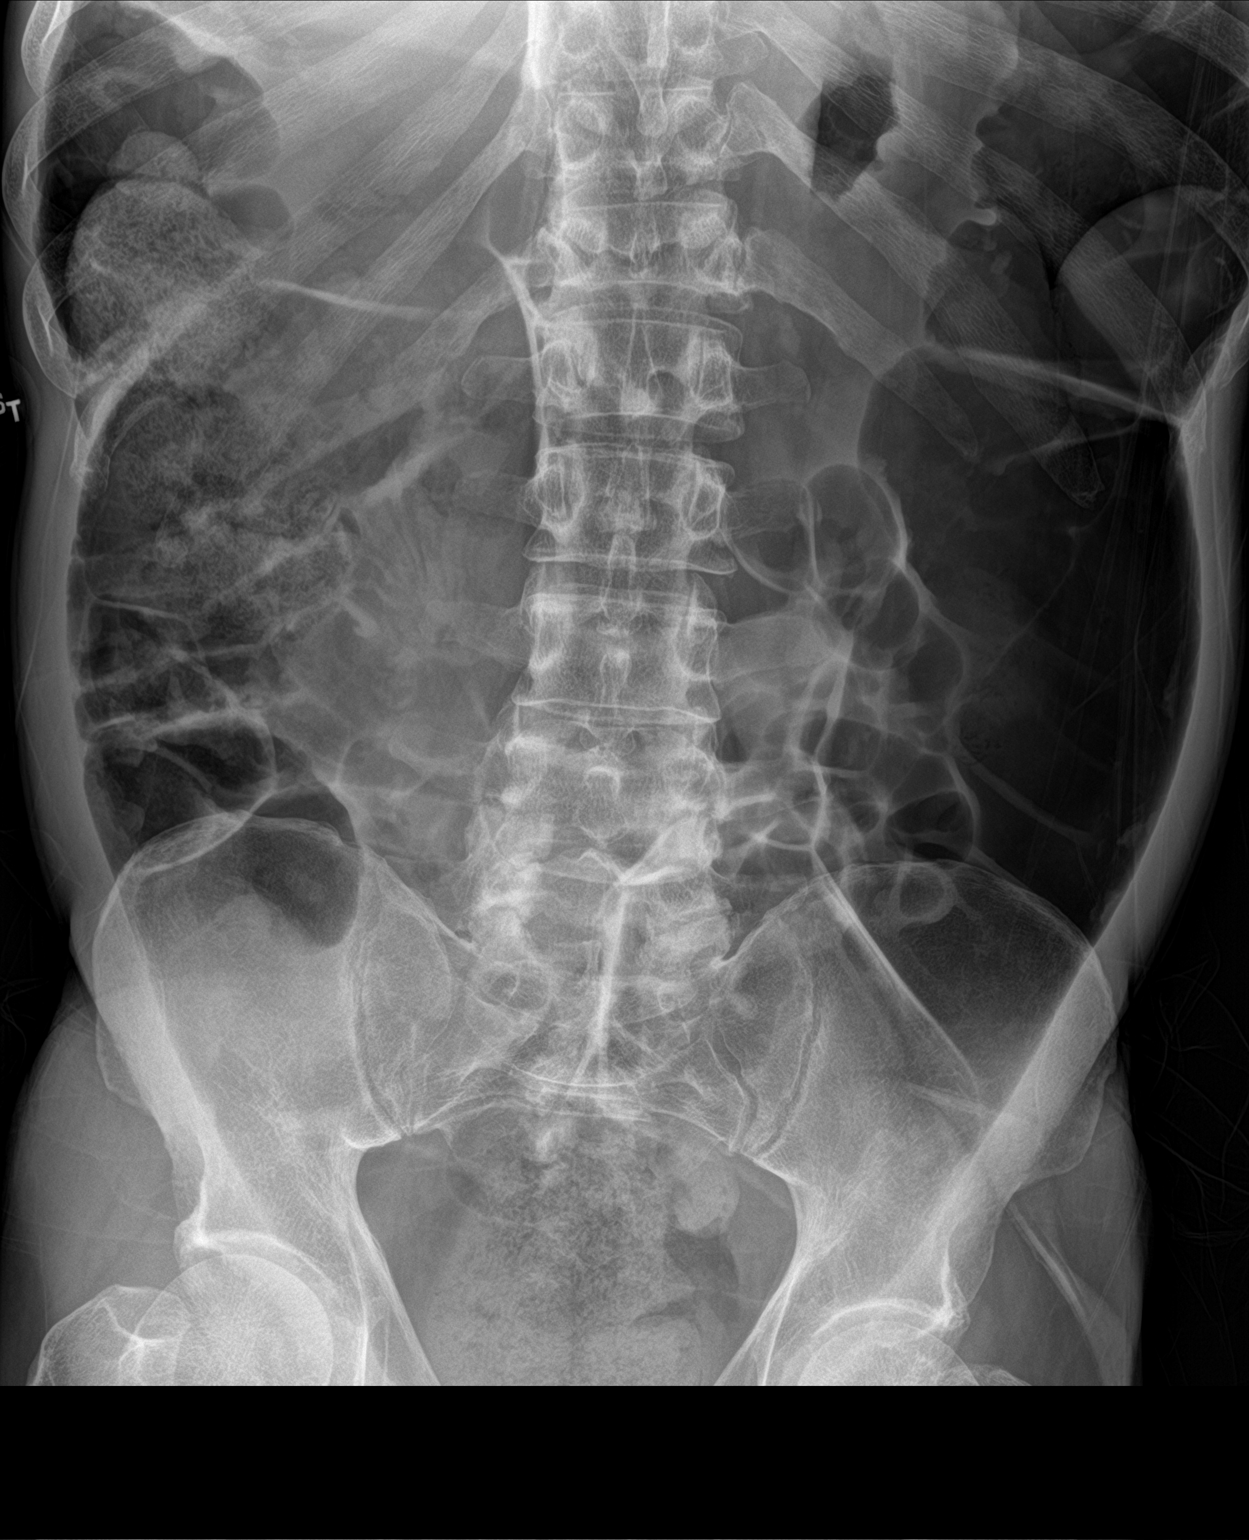

[1 of 1 positions shown; findings below may reference images not displayed]

FINDINGS: Gaseous colonic distension persists but is slightly improved from
05/30/2020 CT. There is stool distending the rectosigmoid colon with
rectal distention of 8 cm. Moderate stool in the right colon. Air
within small bowel loops are nondistended. No evidence of free air.
No abnormal soft tissue calcifications.
IMPRESSION: Gaseous colonic distension persists but is slightly improved from CT
4 days ago. Stool distending the rectosigmoid colon with rectal
distention of 8 cm. Findings consistent with [place] syndrome. There
is no small bowel distension or evidence of small-bowel obstruction.

## 2023-03-30 ENCOUNTER — Other Ambulatory Visit: Payer: Self-pay | Admitting: Behavioral Health

## 2023-03-30 ENCOUNTER — Inpatient Hospital Stay
Admission: EM | Admit: 2023-03-30 | Discharge: 2024-02-14 | DRG: 548 | Disposition: A | Discharge status: Other Acute Inpatient Hospital | Payer: Medicare Other | Attending: Internal Medicine | Admitting: Internal Medicine

## 2023-03-30 DIAGNOSIS — E46 Unspecified protein-calorie malnutrition: Secondary | ICD-10-CM | POA: Diagnosis present

## 2023-03-30 DIAGNOSIS — Z91148 Patient's other noncompliance with medication regimen for other reason: Secondary | ICD-10-CM

## 2023-03-30 DIAGNOSIS — Z008 Encounter for other general examination: Secondary | ICD-10-CM

## 2023-03-30 DIAGNOSIS — Z789 Other specified health status: Secondary | ICD-10-CM | POA: Diagnosis present

## 2023-03-30 DIAGNOSIS — H5010 Unspecified exotropia: Secondary | ICD-10-CM | POA: Diagnosis present

## 2023-03-30 DIAGNOSIS — M25461 Effusion, right knee: Secondary | ICD-10-CM | POA: Diagnosis present

## 2023-03-30 DIAGNOSIS — I959 Hypotension, unspecified: Secondary | ICD-10-CM | POA: Insufficient documentation

## 2023-03-30 DIAGNOSIS — Z91041 Radiographic dye allergy status: Secondary | ICD-10-CM

## 2023-03-30 DIAGNOSIS — L600 Ingrowing nail: Secondary | ICD-10-CM | POA: Diagnosis present

## 2023-03-30 DIAGNOSIS — H905 Unspecified sensorineural hearing loss: Secondary | ICD-10-CM | POA: Diagnosis present

## 2023-03-30 DIAGNOSIS — I861 Scrotal varices: Secondary | ICD-10-CM | POA: Diagnosis present

## 2023-03-30 DIAGNOSIS — R451 Restlessness and agitation: Secondary | ICD-10-CM | POA: Diagnosis not present

## 2023-03-30 DIAGNOSIS — H9190 Unspecified hearing loss, unspecified ear: Secondary | ICD-10-CM

## 2023-03-30 DIAGNOSIS — B351 Tinea unguium: Secondary | ICD-10-CM | POA: Diagnosis present

## 2023-03-30 DIAGNOSIS — H548 Legal blindness, as defined in USA: Secondary | ICD-10-CM | POA: Diagnosis present

## 2023-03-30 DIAGNOSIS — Z79890 Hormone replacement therapy (postmenopausal): Secondary | ICD-10-CM

## 2023-03-30 DIAGNOSIS — D509 Iron deficiency anemia, unspecified: Secondary | ICD-10-CM | POA: Diagnosis present

## 2023-03-30 DIAGNOSIS — Z515 Encounter for palliative care: Secondary | ICD-10-CM

## 2023-03-30 DIAGNOSIS — F4323 Adjustment disorder with mixed anxiety and depressed mood: Secondary | ICD-10-CM | POA: Diagnosis not present

## 2023-03-30 DIAGNOSIS — N451 Epididymitis: Secondary | ICD-10-CM | POA: Diagnosis not present

## 2023-03-30 DIAGNOSIS — E860 Dehydration: Secondary | ICD-10-CM | POA: Diagnosis present

## 2023-03-30 DIAGNOSIS — E559 Vitamin D deficiency, unspecified: Secondary | ICD-10-CM | POA: Diagnosis present

## 2023-03-30 DIAGNOSIS — Z603 Acculturation difficulty: Secondary | ICD-10-CM | POA: Diagnosis present

## 2023-03-30 DIAGNOSIS — M009 Pyogenic arthritis, unspecified: Secondary | ICD-10-CM | POA: Diagnosis not present

## 2023-03-30 DIAGNOSIS — Z681 Body mass index (BMI) 19 or less, adult: Secondary | ICD-10-CM

## 2023-03-30 DIAGNOSIS — F4320 Adjustment disorder, unspecified: Secondary | ICD-10-CM | POA: Diagnosis present

## 2023-03-30 DIAGNOSIS — R54 Age-related physical debility: Secondary | ICD-10-CM | POA: Diagnosis present

## 2023-03-30 DIAGNOSIS — E43 Unspecified severe protein-calorie malnutrition: Secondary | ICD-10-CM | POA: Diagnosis present

## 2023-03-30 DIAGNOSIS — G8929 Other chronic pain: Secondary | ICD-10-CM | POA: Diagnosis present

## 2023-03-30 DIAGNOSIS — Z659 Problem related to unspecified psychosocial circumstances: Secondary | ICD-10-CM | POA: Diagnosis present

## 2023-03-30 DIAGNOSIS — R1031 Right lower quadrant pain: Secondary | ICD-10-CM

## 2023-03-30 DIAGNOSIS — M1712 Unilateral primary osteoarthritis, left knee: Secondary | ICD-10-CM | POA: Diagnosis present

## 2023-03-30 DIAGNOSIS — Z5320 Procedure and treatment not carried out because of patient's decision for unspecified reasons: Secondary | ICD-10-CM | POA: Diagnosis present

## 2023-03-30 DIAGNOSIS — R4589 Other symptoms and signs involving emotional state: Secondary | ICD-10-CM | POA: Diagnosis present

## 2023-03-30 DIAGNOSIS — K5909 Other constipation: Secondary | ICD-10-CM | POA: Diagnosis present

## 2023-03-30 DIAGNOSIS — K5981 Ogilvie syndrome: Secondary | ICD-10-CM | POA: Diagnosis present

## 2023-03-30 DIAGNOSIS — L309 Dermatitis, unspecified: Secondary | ICD-10-CM | POA: Diagnosis present

## 2023-03-30 DIAGNOSIS — E039 Hypothyroidism, unspecified: Secondary | ICD-10-CM | POA: Diagnosis present

## 2023-03-30 DIAGNOSIS — Z933 Colostomy status: Secondary | ICD-10-CM

## 2023-03-30 DIAGNOSIS — Z781 Physical restraint status: Secondary | ICD-10-CM

## 2023-03-30 DIAGNOSIS — L603 Nail dystrophy: Secondary | ICD-10-CM | POA: Diagnosis present

## 2023-03-30 DIAGNOSIS — K9409 Other complications of colostomy: Secondary | ICD-10-CM | POA: Diagnosis present

## 2023-03-30 LAB — CBC WITH DIFFERENTIAL/PLATELET
Abs Immature Granulocytes: 0.01 10*3/uL (ref 0.00–0.07)
Basophils Absolute: 0 10*3/uL (ref 0.0–0.1)
Basophils Relative: 1 %
Eosinophils Absolute: 0.1 10*3/uL (ref 0.0–0.5)
Eosinophils Relative: 2 %
HCT: 31 % — ABNORMAL LOW (ref 39.0–52.0)
Hemoglobin: 9.9 g/dL — ABNORMAL LOW (ref 13.0–17.0)
Immature Granulocytes: 0 %
Lymphocytes Relative: 21 %
Lymphs Abs: 0.9 10*3/uL (ref 0.7–4.0)
MCH: 30.4 pg (ref 26.0–34.0)
MCHC: 31.9 g/dL (ref 30.0–36.0)
MCV: 95.1 fL (ref 80.0–100.0)
Monocytes Absolute: 0.4 10*3/uL (ref 0.1–1.0)
Monocytes Relative: 10 %
Neutro Abs: 2.7 10*3/uL (ref 1.7–7.7)
Neutrophils Relative %: 66 %
Platelets: 177 10*3/uL (ref 150–400)
RBC: 3.26 MIL/uL — ABNORMAL LOW (ref 4.22–5.81)
RDW: 15.1 % (ref 11.5–15.5)
WBC: 4 10*3/uL (ref 4.0–10.5)
nRBC: 0 % (ref 0.0–0.2)

## 2023-03-30 LAB — COMPREHENSIVE METABOLIC PANEL
ALT: 21 U/L (ref 0–44)
AST: 34 U/L (ref 15–41)
Albumin: 4.2 g/dL (ref 3.5–5.0)
Alkaline Phosphatase: 57 U/L (ref 38–126)
Anion gap: 7 (ref 5–15)
BUN: 15 mg/dL (ref 8–23)
CO2: 31 mmol/L (ref 22–32)
Calcium: 9 mg/dL (ref 8.9–10.3)
Chloride: 101 mmol/L (ref 98–111)
Creatinine, Ser: 1.04 mg/dL (ref 0.61–1.24)
GFR, Estimated: >60 mL/min (ref >60)
Glucose, Bld: 113 mg/dL — ABNORMAL HIGH (ref 70–99)
Potassium: 3.9 mmol/L (ref 3.5–5.1)
Sodium: 139 mmol/L (ref 135–145)
Total Bilirubin: 0.6 mg/dL (ref 0.3–1.2)
Total Protein: 8.2 g/dL — ABNORMAL HIGH (ref 6.5–8.1)

## 2023-03-30 LAB — URINE DRUG SCREEN, QUALITATIVE (ARMC ONLY)
Amphetamines, Ur Screen: NOT DETECTED
Barbiturates, Ur Screen: NOT DETECTED
Benzodiazepine, Ur Scrn: NOT DETECTED
Cannabinoid 50 Ng, Ur ~~LOC~~: NOT DETECTED
Cocaine Metabolite,Ur ~~LOC~~: NOT DETECTED
MDMA (Ecstasy)Ur Screen: NOT DETECTED
Methadone Scn, Ur: NOT DETECTED
Opiate, Ur Screen: NOT DETECTED
Phencyclidine (PCP) Ur S: NOT DETECTED
Tricyclic, Ur Screen: NOT DETECTED

## 2023-03-30 LAB — ETHANOL: Alcohol, Ethyl (B): <10 mg/dL (ref <10)

## 2023-03-30 LAB — SALICYLATE LEVEL: Salicylate Lvl: <7 mg/dL — ABNORMAL LOW (ref 7.0–30.0)

## 2023-03-30 LAB — ACETAMINOPHEN LEVEL: Acetaminophen (Tylenol), Serum: <10 ug/mL — ABNORMAL LOW (ref 10–30)

## 2023-03-30 MED ORDER — ALUM & MAG HYDROXIDE-SIMETH 200-200-20 MG/5ML PO SUSP
30.0000 mL | Freq: Four times a day (QID) | ORAL | Status: DC | PRN
  Administered 2023-09-25 – 2023-12-09 (×3): 30 mL via ORAL
  Filled 2023-03-30 (×5): qty 30

## 2023-03-30 MED ORDER — OLANZAPINE 5 MG PO TBDP
5.0000 mg | ORAL_TABLET | Freq: Two times a day (BID) | ORAL | Status: DC | PRN
  Filled 2023-03-30: qty 1

## 2023-03-30 MED ORDER — ONDANSETRON HCL 4 MG PO TABS
4.0000 mg | ORAL_TABLET | Freq: Three times a day (TID) | ORAL | Status: DC | PRN
  Filled 2023-03-30: qty 1

## 2023-03-30 MED ORDER — DORZOLAMIDE HCL 2 % OP SOLN
1.0000 [drp] | Freq: Two times a day (BID) | OPHTHALMIC | Status: DC
  Administered 2023-03-30 – 2023-07-11 (×177): 1 [drp] via OPHTHALMIC
  Filled 2023-03-30 (×3): qty 10

## 2023-03-30 MED ORDER — HYDROXYZINE HCL 25 MG PO TABS
25.0000 mg | ORAL_TABLET | Freq: Three times a day (TID) | ORAL | Status: DC | PRN
  Administered 2023-04-15 – 2023-08-30 (×7): 25 mg via ORAL
  Filled 2023-03-30 (×12): qty 1

## 2023-03-30 MED ORDER — TRAZODONE HCL 50 MG PO TABS
50.0000 mg | ORAL_TABLET | Freq: Every evening | ORAL | Status: DC | PRN
  Administered 2023-04-15 – 2023-04-19 (×2): 50 mg via ORAL
  Filled 2023-03-30 (×4): qty 1

## 2023-03-30 MED ORDER — ARTIFICIAL TEARS OPHTHALMIC OINT
TOPICAL_OINTMENT | OPHTHALMIC | Status: DC | PRN
  Administered 2023-04-05 – 2023-04-18 (×3): 1 via OPHTHALMIC
  Filled 2023-03-30: qty 3.5

## 2023-03-30 MED ORDER — IBUPROFEN 600 MG PO TABS
600.0000 mg | ORAL_TABLET | Freq: Three times a day (TID) | ORAL | Status: DC | PRN
  Administered 2023-05-05: 600 mg via ORAL
  Filled 2023-03-30 (×2): qty 1

## 2023-03-30 MED ORDER — OLANZAPINE 10 MG IM SOLR
5.0000 mg | Freq: Two times a day (BID) | INTRAMUSCULAR | Status: DC | PRN
  Filled 2023-03-30: qty 10
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
## 2023-03-30 NOTE — ED Notes (Signed)
This RN introduced self to pt with assistance of ASL interpreter. Pt asking for something to eat, denies any other needs at this time.
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
## 2023-03-30 NOTE — ED Notes (Signed)
Interpreter request put in for patient for 03/31/23 7:30am
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
## 2023-03-30 NOTE — ED Notes (Signed)
VOL/TOC consult placed for SNF placement.
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
## 2023-03-30 NOTE — ED Notes (Signed)
Ostomy bag replaced with 3.5 inch ostomy cover and bag.
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
## 2023-03-30 NOTE — ED Notes (Addendum)
ASL interpreter leaving, will return around 7:30 pm. Pt notified by interpreter before leaving. White dry erase board at patient bedside to assist with communication. Interpreter left contact information.  [name]  [phone]

## 2023-03-30 NOTE — ED Notes (Signed)
Patient requesting his green jacket. Interpreter informed patient, that he is unable to have his jacket and other belongings at this time.

## 2023-03-30 NOTE — ED Notes (Signed)
ASL interpreter that came in with patient via ems, remains at pt bedside

## 2023-03-30 NOTE — ED Notes (Signed)
ASL Tactile interpreter requests put in for patient by this RN. Appointments scheduled 3 times a day starting 03/31/23

## 2023-03-31 ENCOUNTER — Emergency Department: Payer: Medicare Other

## 2023-03-31 MED ORDER — POLYETHYLENE GLYCOL 3350 17 G PO PACK
17.0000 g | PACK | Freq: Every day | ORAL | Status: DC
  Administered 2023-03-31 – 2023-06-30 (×76): 17 g via ORAL
  Filled 2023-03-31 (×75): qty 1

## 2023-03-31 NOTE — ED Notes (Signed)
Patient's sitter left for the day, no sitter for the rest of day, sitter did give some good pointers of sign language to understand for some of His needs, we will pass on in report. Patient is calm and cooperative, no issues noted at this time.

## 2023-03-31 NOTE — ED Notes (Signed)
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 Staff will continue to monitor for safety.

## 2023-03-31 NOTE — ED Notes (Signed)
Patient sitting up having breakfast, sitter by bed, He is being calm and cooperative, no behavioral issues , staff will continue to monitor.

## 2023-03-31 NOTE — ED Notes (Signed)
Patient is oriented, He ask to be assisted to move up in the bed, Nurse and tech assisted him, No signs of distress, although Patient is easily agitated at times. Nurse communicates with interpreter for His needs to be met.

## 2023-03-31 NOTE — ED Notes (Signed)
Pt assisted with urinal. Ostomy bag drained of liquid stool at this time.

## 2023-03-31 NOTE — Consult Note (Signed)
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 Stoma is digitalized by provider and patent.See assessment from EDP Dr. Edwena Bunde. Stomal assessment/size: Not measured today Peristomal assessment: intact, clear Treatment options for stomal/peristomal skin:  Output: brown stool Ostomy pouching: 2pc. 4-inch pouching system with skin barrier ring. Supplies: 4-inch ostomy kit:  [employer] # [id number] and skin barrier ring: [employer] # [phone] (two are to be used with each pouch change) Education provided: None. Patient is not independent in care. Instructions for ostomy management are provided for Nursing via the Orders. Enrolled patient in [place] Secure Start D/C program: No. This is an established ostomy with supplies provided by facility.  WOC nursing team will not follow, but will remain available to this patient, the nursing and medical teams.  Please re-consult if needed.  Thank you for inviting [place] to participate in this patient's Plan of Care.  Ladona Mow, MSN, RN, CNS, GNP, Leda Min, [employer], [employer] phone:  [phone]

## 2023-04-01 NOTE — ED Notes (Signed)
Pt complaining of stool smell, pt thinks his ostomy bag needs changing. Upon inspection, ostomy bag is open and draining stool onto sheets. Pt ostomy bag emptied at this time, not changed due to not having any supplies available currently. Sheets changed. PT here to eval pt.

## 2023-04-01 NOTE — Progress Notes (Signed)
PT Cancellation Note  Patient Details Name: Martin [name] MRN: [id number] DOB: [birth year]   Cancelled Treatment:    Reason Eval/Treat Not Completed: Patient declined, no reason specified. Evaluation attempted 3x. Pt refusing each time even with education on the benefits and need for PT in order to begin d/c planning.    [name] 04/01/2023, 3:01 PM

## 2023-04-01 NOTE — ED Notes (Signed)
Pt assisted with urinal

## 2023-04-01 NOTE — ED Notes (Signed)
Pt stating he needs to void, provided with urinal and privacy screen to stand at bedside to void. Pt placed back on side of bed to eat lunch. Interpretor at bedside to help.

## 2023-04-01 NOTE — ED Notes (Signed)
Repositioned pt in bed, checked ostomy bag no need to empty at this time

## 2023-04-01 NOTE — ED Notes (Signed)
VOL TOC placement 

## 2023-04-01 NOTE — ED Provider Notes (Signed)
Emergency Medicine Observation Re-evaluation Note  Physical Exam   BP (!) 107/59 (BP Location: Right Arm)   Pulse 72   Temp 98.2 F (36.8 C) (Oral)   Resp 18   SpO2 95%   Patient appears in no acute distress.  ED Course / MDM   No reported events during my shift at the time of this note.   Pt is awaiting dispo from SW   Pilar Jarvis MD    Pilar Jarvis, MD 04/01/23 [phone]

## 2023-04-01 NOTE — ED Notes (Signed)
Pts ASL interpreter at bedside

## 2023-04-01 NOTE — ED Notes (Signed)
Attempted to call supply chain for ostomy supplies with no answer. Will attempt again at a later time.

## 2023-04-01 NOTE — ED Notes (Signed)
EDT Caitlyn attempting to get vital signs, pt refusing at this time. Wants to wait until after eating lunch.

## 2023-04-01 NOTE — ED Notes (Signed)
Shift change report pt has hid belonging under his pillow. RN checked pt's bed and pt has his belongings under him, only wearing a burgundy top.

## 2023-04-01 NOTE — Progress Notes (Signed)
PT Cancellation Note  Patient Details Name: Martin [name] MRN: [id number] DOB: 22-Jan-1956   Cancelled Treatment:    Reason Eval/Treat Not Completed: Patient declined, no reason specified. Pt requesting to hold PT until after breakfast. Will follow up with patient prior to ASL interpreter leaving.    Eleftherios Dudenhoeffer A Ottie Neglia 04/01/2023, 9:52 AM

## 2023-04-01 NOTE — ED Notes (Signed)
Pt given dinner tray, EDT Kim at bedside to help pt eat.

## 2023-04-02 NOTE — ED Notes (Signed)
Pt asleep. Vitals will be assessed when he wakes up. Snack provided on bedside table.

## 2023-04-02 NOTE — TOC Progression Note (Signed)
Transition of Care [facility]) - Progression Note    Patient Details  Name: Martin [name] MRN: [id number] Date of Birth: 20-Nov-1955  [facility] CM/SW Contact  Liliana Cline, LCSW Phone Number: 04/02/2023, 11:00 AM  Clinical Narrative:    CSW reached out to Chapin at [facility] to inquire if she knows what other facilities the facility SW had reached out to for patient, she is not aware. She states she can follow up tomorrow.    Expected Discharge Plan:  (TBD) Barriers to Discharge: Continued Medical Work up  Expected Discharge Plan and Services                                               Social Determinants of Health (SDOH) Interventions SDOH Screenings   Tobacco Use: Low Risk  (12/08/2020)    Readmission Risk Interventions     No data to display

## 2023-04-02 NOTE — ED Provider Notes (Signed)
Emergency Medicine Observation Re-evaluation Note  Martin [name] is a 67 y.o. male, seen on rounds today.  Pt initially presented to the ED for complaints of IVC  Currently, the patient is calm, no acute complaints.  Physical Exam  Blood pressure 104/63, pulse 75, temperature 99.1 F (37.3 C), temperature source Oral, resp. rate 18, SpO2 93 %. Physical Exam General: NAD Lungs: CTAB Psych: not agitated  ED Course / MDM  EKG:    I have reviewed the labs performed to date as well as medications administered while in observation.  Recent changes in the last 24 hours include no acute events overnight.    Plan  Current plan is for TOC placement.   Sharman Cheek, MD 04/02/23 [phone]

## 2023-04-02 NOTE — ED Notes (Signed)
VOL/TOC Placement 

## 2023-04-02 NOTE — ED Notes (Signed)
Pt given lunch tray and beverage 

## 2023-04-02 NOTE — ED Notes (Signed)
Pt noted to have jacket at bedside, interpretor states his ipad is in jacket and he gets upset if we try to take it. Pt not using ipad since this RN has been working with pt.

## 2023-04-02 NOTE — ED Notes (Signed)
Pt woken up for dinner, pt ate about 60%.

## 2023-04-02 NOTE — ED Notes (Signed)
Pt asleep at this time, dinner and drink at bedside.

## 2023-04-02 NOTE — ED Notes (Signed)
Ostomy bag checked at this time, minute amount of stool in bag. Will continue to monitor and empty/replace as needed.

## 2023-04-02 NOTE — ED Notes (Signed)
Pt needing to void, given urinal.

## 2023-04-02 NOTE — ED Notes (Signed)
PT here to eval pt.  
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
## 2023-04-02 NOTE — ED Notes (Signed)
Pt ate 90% of lunch, and is asking for cookies. This RN called dietary, cookies will be sent soon.

## 2023-04-03 NOTE — ED Provider Notes (Signed)
Emergency Medicine Observation Re-evaluation Note  Physical Exam   BP 104/63 (BP Location: Right Arm)   Pulse 75   Temp 99.1 F (37.3 C) (Oral)   Resp 18   SpO2 93%   Patient appears in no acute distress.  ED Course / MDM   No reported events during my shift at the time of this note.   Pt is awaiting dispo from SW   Pilar Jarvis MD    Pilar Jarvis, MD 04/03/23 [phone]

## 2023-04-03 NOTE — ED Notes (Signed)
This RN was asked to speak with patient regarding him having his razor on his person. Patient has interpreter at bedside with him entire conversation took place through the use of interpreter. This RN introduced herself as the Press photographer, pt responded "what do you want". This RN explained that I was asked to speak with him about him having his razor with him, pt stated that he wants his razor back and it needs to be in his bag. It was explained to the patient that due to hospital policy he was not allowed to have a razor with him and that it was locked up with his stuff and that it would be given back to him when he left the facility. Pt responded "it is mine, it is not yours, you have no right to take it, fuck the hospital policy". This RN again reiterated that he would not be given back his razor, pt became upset stating that if we did not give him his razor back that he would act out and that he can be dangerous.Pt was informed that it is not acceptable to threaten staff. Officer [place] called for additional security presence at bedside. Interpreter remains at bedside. Pt continue to state after RN walked away that he wanted his razor back and that he did not trust the hospital because things get stolen from here.

## 2023-04-03 NOTE — ED Notes (Signed)
This nurse at the bedside with interpreter for assistance communicating pt needs. Pt states he is not able to feed himself and needs to be fed. Pt fed approx 50% of his meal by this nurse. For remainder of meal pt given fork and encouraged to feed himself.  Pt was assisted with getting food pieces on his fork and then guided hand to his mouth so pt is able to confidently  feed himself. Aprpox 90% of meal consumed.

## 2023-04-03 NOTE — ED Notes (Signed)
Dinner tray provided and set up for pt. Interpreter has returned and is at the bedside. Pt resting with eyes closed and even respirations.

## 2023-04-03 NOTE — ED Notes (Signed)
Pt provided with lunch tray and water. Pt resting with eyes closed and even respirations. No distress noted at this time.

## 2023-04-03 NOTE — ED Notes (Signed)
Pt given urinal.

## 2023-04-03 NOTE — ED Notes (Signed)
Pt with interpreter sitting up on stretcher. Pt states loudly if he does not get his razor right now he will get dangerous. This nurse explained to pt that he is not able to keep a razor at the bedside for pt safety due to hospital policy. Pt continues to excalate. Security at the bedside explaining hospital policy for why he is not able to keep his razor at the bedside in the quad. Pt continues to yell loudly at staff and demand his razor be returned to him. Charge nurse and MD aware.

## 2023-04-03 NOTE — ED Notes (Signed)
Vitals not obtained this shift due to patient's aggressive behavior

## 2023-04-03 NOTE — ED Notes (Signed)
VOL/TOC Placement 

## 2023-04-04 NOTE — ED Notes (Signed)
Pt in bed with eyes closed, pt arouses easily to tactile stim, assisted pt with urinal, pt is dry, pt denies pain, assisted pt with dinner, pt ate aprox 1/2 of his meal tray.

## 2023-04-04 NOTE — ED Notes (Signed)
Pt asleep, VS not obtained at this time.  

## 2023-04-04 NOTE — ED Notes (Signed)
Hospital meal provided, pt tolerated w/o complaints.  Waste discarded appropriately.  

## 2023-04-04 NOTE — TOC Progression Note (Signed)
Transition of Care [facility]) - Progression Note    Patient Details  Name: Martin [name] MRN: [id number] Date of Birth: [birth year]  [facility] CM/SW Contact  Darleene Cleaver, [place] Phone Number: 04/04/2023, 6:26 PM  Clinical Narrative:     Patient has been faxed out awaiting bed offers for LTC placement.  Expected Discharge Plan:  (TBD) Barriers to Discharge: Continued Medical Work up  Expected Discharge Plan and Services                                               Social Determinants of Health (SDOH) Interventions SDOH Screenings   Tobacco Use: Low Risk  (12/08/2020)    Readmission Risk Interventions     No data to display

## 2023-04-04 NOTE — ED Provider Notes (Signed)
Emergency Medicine Observation Re-evaluation Note  Physical Exam   BP 97/61 (BP Location: Right Arm)   Pulse 75   Temp 98.1 F (36.7 C) (Oral)   Resp 18   SpO2 98%   Patient appears in no acute distress.  He is currently resting but appears to have had coffee and ate a cookie  ED Course / MDM   No reported events during my shift at the time of this note.   Pt is awaiting dispo from SW   Sharyn Creamer, MD 04/04/23 [phone]

## 2023-04-04 NOTE — ED Notes (Signed)
Food tray was given with drink. 

## 2023-04-04 NOTE — NC FL2 (Signed)
[place] MEDICAID FL2 LEVEL OF CARE FORM     IDENTIFICATION  Patient Name: Martin [name] Birthdate: 02/17/1956 Sex: male Admission Date (Current Location): 03/30/2023  [place] and [place] Number:  [name] [id number] R Facility and Address:  [facility], [address] [place], [place] [address]      Provider Number: [id number]  Attending Physician Name and Address:  No att. providers found  Relative Name and Phone Number:  Elissa Hefty [phone]  [phone]  Claud, [place] Spouse [phone]  [phone]    Current Level of Care: Hospital Recommended Level of Care: Skilled Nursing Facility Prior Approval Number:    Date Approved/Denied:   PASRR Number: [id number]  Discharge Plan: SNF    Current Diagnoses: Patient Active Problem List   Diagnosis Date Noted   Abdominal pain 07/15/2020   Abdominal distension 06/04/2020   Ogilvie's syndrome    Leukopenia    Thrombocytopenia (HCC)    CKD (chronic kidney disease)    Distended abdomen    Gout    Adjustment disorder with mixed anxiety and depressed mood 03/22/2017   Lethargy 03/21/2017   Acute encephalopathy    FTT (failure to thrive) in adult    Protein-calorie malnutrition, severe 12/23/2015   Septic joint (HCC) 12/21/2015   Chronic pain syndrome 12/21/2015   Legally blind 12/21/2015   Deaf 12/21/2015   Hypothyroidism 12/21/2015   Swelling of left hand 12/21/2015   Chronic constipation     Orientation RESPIRATION BLADDER Height & Weight     Self, Time, Situation, Place  Normal Continent Weight:   Height:     BEHAVIORAL SYMPTOMS/MOOD NEUROLOGICAL BOWEL NUTRITION STATUS      Continent Diet  AMBULATORY STATUS COMMUNICATION OF NEEDS Skin   Limited Assist Verbally Normal                       Personal Care Assistance Level of Assistance  Bathing, Feeding, Dressing Bathing Assistance: Limited assistance Feeding assistance: Independent Dressing Assistance: Limited  assistance     Functional Limitations Info  Sight, Hearing, Speech Sight Info: Impaired Hearing Info: Impaired Speech Info: Adequate    SPECIAL CARE FACTORS FREQUENCY                       Contractures Contractures Info: Not present    Additional Factors Info  Code Status, Allergies Code Status Info: Full code Allergies Info: Ivp Dye (Iodinated Contrast Media)           Current Medications (04/04/2023):  This is the current hospital active medication list Current Facility-Administered Medications  Medication Dose Route Frequency Provider Last Rate Last Admin   alum & mag hydroxide-simeth (MAALOX/MYLANTA) 200-200-20 MG/5ML suspension 30 mL  30 mL Oral Q6H PRN Sharman Cheek, MD       artificial tears (LACRILUBE) ophthalmic ointment   Both Eyes Q1H PRN Sharman Cheek, MD       dorzolamide (TRUSOPT) 2 % ophthalmic solution 1 drop  1 drop Both Eyes BID Sharman Cheek, MD   1 drop at 04/04/23 3016   hydrOXYzine (ATARAX) tablet 25 mg  25 mg Oral TID PRN Bennett, Christal H, NP       ibuprofen (ADVIL) tablet 600 mg  600 mg Oral Q8H PRN Sharman Cheek, MD       OLANZapine zydis (ZYPREXA) disintegrating tablet 5 mg  5 mg Oral BID PRN Bennett, Christal H, NP       Or   OLANZapine (ZYPREXA) injection 5 mg  5 mg Intramuscular BID PRN Bennett, Christal H, NP       ondansetron (ZOFRAN) tablet 4 mg  4 mg Oral Q8H PRN Sharman Cheek, MD       polyethylene glycol (MIRALAX / GLYCOLAX) packet 17 g  17 g Oral Daily Delton Prairie, MD   17 g at 04/04/23 0959   traZODone (DESYREL) tablet 50 mg  50 mg Oral QHS PRN Hampton Abbot, NP       Current Outpatient Medications  Medication Sig Dispense Refill   acetaminophen (TYLENOL) 325 MG tablet Take 650 mg by mouth every 8 (eight) hours as needed.     bimatoprost (LUMIGAN) 0.01 % SOLN Place 1 drop into both eyes at bedtime. (Patient taking differently: Place 1 drop into both eyes in the morning, at noon, and at bedtime.  0900/1300/2100) 7.5 mL 2   diclofenac Sodium (VOLTAREN) 1 % GEL Apply 2 g topically every 6 (six) hours as needed. Apply to right shoulder     dorzolamide-timolol (COSOPT) 22.3-6.8 MG/ML ophthalmic solution Place 1 drop into both eyes 2 (two) times daily. 10 mL 2   Menthol, Topical Analgesic, (BIOFREEZE) 4 % GEL Apply 1 Application topically in the morning and at bedtime.     Netarsudil Dimesylate (RHOPRESSA) 0.02 % SOLN Place 1 drop into both eyes at bedtime.     polyethylene glycol powder (GLYCOLAX/MIRALAX) 17 GM/SCOOP powder Take 17 g by mouth 2 (two) times daily. 255 g 0   traMADol (ULTRAM) 50 MG tablet Take by mouth every 12 (twelve) hours as needed.     diclofenac (VOLTAREN) 75 MG EC tablet Take 75 mg by mouth. (Patient not taking: Reported on 03/30/2023)     GABAPENTIN PO Take by mouth. (Patient not taking: Reported on 03/30/2023)     levothyroxine (SYNTHROID) 125 MCG tablet Take 1 tablet (125 mcg total) by mouth daily before breakfast. (Patient not taking: Reported on 07/15/2020) 30 tablet 2   linaclotide (LINZESS) 290 MCG CAPS capsule Take 1 capsule (290 mcg total) by mouth daily before breakfast. (Patient not taking: Reported on 03/30/2023) 30 capsule 0     Discharge Medications: Please see discharge summary for a list of discharge medications.  Relevant Imaging Results:  Relevant Lab Results:   Additional Information SSN [id number]  Darleene Cleaver, LCSW

## 2023-04-04 NOTE — ED Notes (Signed)
Pt in bed with eyes closed, resps even and unlabored, interpreter at bedside

## 2023-04-04 NOTE — ED Notes (Signed)
Assisted pt with his meal tray, pt ate almost all of his lunch. Pt states that he doesn't need to use the bathroom

## 2023-04-04 NOTE — ED Notes (Signed)
Patient awaken wanting to eat. Used white board to let patient know that we do not have any trays or sandwich boxes at this time. Offered patient crackers in which he threw them on the floor. Patient is lying in bed and making loud noises. Staff nearby

## 2023-04-04 NOTE — ED Notes (Signed)
No interpreter available per charge RN

## 2023-04-04 NOTE — ED Notes (Signed)
vol/toc placement.. 

## 2023-04-04 NOTE — ED Notes (Signed)
Patient was given a sandwich tray. He consumed half a sandwich with a bag of chips. Patient is now calm and is attempting to go back to sleep.

## 2023-04-04 NOTE — ED Notes (Signed)
Pt in bed, changed ostomy bag.  Interpreter states that he needs to leave because he has another appointment.  Pt states that he can only communicate with interpreter.  Consulting civil engineer notified

## 2023-04-05 MED ORDER — LATANOPROST 0.005 % OP SOLN
1.0000 [drp] | Freq: Every day | OPHTHALMIC | Status: DC
  Administered 2023-04-05 – 2024-02-13 (×254): 1 [drp] via OPHTHALMIC
  Filled 2023-04-05 (×12): qty 2.5

## 2023-04-05 MED ORDER — ACETAMINOPHEN 325 MG PO TABS
650.0000 mg | ORAL_TABLET | Freq: Four times a day (QID) | ORAL | Status: DC | PRN
  Administered 2023-04-05 – 2023-06-17 (×5): 650 mg via ORAL
  Filled 2023-04-05 (×8): qty 2

## 2023-04-05 NOTE — ED Notes (Signed)
Pt communicated through the help of an ASL interpretor pt stated that they have white specks in their eyes and they want a MD to look at them. Pt states that they don't want their eyes to get any worse. Pt was told what eye drops that they have ordered for them, but wanted to wait to use them until 1000. Pt was told that we would let the MD that know that they want to see them, but that they will need to be patient with [place] until the MD can see him.

## 2023-04-05 NOTE — ED Notes (Signed)
Pt standing at bedside and with the help of the ASL interpretor, was told that pt wanted to walk around and that they need a walker. Pt was educated that they cannot walk around the hallway right now, because we are moving pt's into different rooms and it is not safe. When we are able to get either a mobility specialist or a PT to come and walk with them they will be able to get up safely, but they need to be patient with [place] while we try to work that out.
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
## 2023-04-05 NOTE — ED Notes (Signed)
Patient is resting comfortably. 

## 2023-04-05 NOTE — ED Notes (Signed)
Pt up to use restroom at this time.

## 2023-04-05 NOTE — ED Notes (Signed)
VOL  PENDING  TOC  PLACEMENT 

## 2023-04-05 NOTE — ED Notes (Signed)
Pt is resting in bed, asleep under blanket at this time

## 2023-04-05 NOTE — ED Notes (Signed)
This RN assisted pt to eat dinner. Pt ate about 25% of meal with water.

## 2023-04-05 NOTE — ED Notes (Signed)
PT working with pt.

## 2023-04-05 NOTE — ED Notes (Signed)
Pt awake and communicating with nurse via dry erase board. Pt urinates, asks for eye drops, asks for food but wants nothing that is offered. Remains laying in bed and aided with blankets

## 2023-04-06 NOTE — ED Notes (Signed)
Pt assisted with urinal at this time.  

## 2023-04-06 NOTE — ED Notes (Signed)
Pt given cereal at this time. ?

## 2023-04-06 NOTE — ED Notes (Signed)
Pt given dinner tray and beverage  

## 2023-04-06 NOTE — TOC Progression Note (Addendum)
Transition of Care [facility]) - Progression Note    Patient Details  Name: Martin [name] MRN: [id number] Date of Birth: [birth year]  Transition of Care [facility]) CM/SW Contact  Darolyn Rua, [place] Phone Number: 04/06/2023, 9:29 AM  Clinical Narrative:     Update: Niece Westley Hummer called this CSW back, she reports that patient has no family support to go home to. She reports that she herself has pancreatic cancer, she tried to have patient stay with her a year prior to patient going to [facility] and she reports it was a terrible experience and she cannot do that again. She reports that patient's sister lives in [place] but is bed bound herself, is 39 years old and her son is caregiver. These are the only 2 family members and neither would be safe discharge options for patient. She is aware of barriers to placement which now include the behaviors at [facility] (they are refusing to accept patient back). Patient is difficult to place, [facility] supervisor aware.    Revonda Standard at [facility] where patient was living prior to admission provided additional family contacts for patient.   Westley Hummer (niece) [phone]: Called, no answer, lvm  Lucinda Dell (sister) [phone] or [phone], Called both numbers no answer, went straight to vm. VM left  Per PT, patient requested we reach out to his ex spouse Mae. CSW called [phone] no answer lvm.   Expected Discharge Plan:  (TBD) Barriers to Discharge: Continued Medical Work up  Expected Discharge Plan and Services                                               Social Determinants of Health (SDOH) Interventions SDOH Screenings   Tobacco Use: Low Risk  (12/08/2020)    Readmission Risk Interventions     No data to display

## 2023-04-06 NOTE — ED Provider Notes (Signed)
-----------------------------------------   5:43 AM on 04/06/2023 -----------------------------------------   Blood pressure 98/60, pulse 74, temperature 98.4 F (36.9 C), temperature source Oral, resp. rate 18, SpO2 97 %.  The patient is calm and cooperative at this time.  There have been no acute events since the last update.  Awaiting disposition plan from Social Work team.   Irean Hong, MD 04/06/23 [phone]

## 2023-04-06 NOTE — ED Notes (Signed)
Pt insist that needs to be fed by staff. Pt does not have any issues with his hands. Pt has been seem using his phone again. Pt finally fed himself a few bites with assist. Pt is capable of using his hand to feed himself.

## 2023-04-06 NOTE — ED Notes (Signed)
Pt requesting colostomy bag be changed at this time. Pt noted to have stool on pants and bed sheets. Pericare provided and full linen change performed at this time. New colostomy bag placed at this time. 50 cc of stool output.

## 2023-04-06 NOTE — ED Notes (Signed)
Vol toc placement 
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
## 2023-04-06 NOTE — ED Notes (Signed)
Pt offered food but states he wants eggs and bacon, pt informed we do not have that available at this time, pt offered [place] sandwich or crackers at this time but pt denies wanting either.

## 2023-04-06 NOTE — ED Notes (Signed)
Pt has been in his phone using both of his hands. Pt is capable to use his hands to feed himself with assistance of staff. This tech pre cut pt's food and assisted pt with the fork to feed himself by guiding him. Pt does not want to use his hands to feed himself. And put his arms back inside the blankets. And went back to sleep. This tech will attempt to assist pt to feed himself when pt wakes up.

## 2023-04-06 NOTE — ED Notes (Signed)
VOL  TOC  PLACEMENT 

## 2023-04-07 NOTE — ED Notes (Signed)
Meal tray given 

## 2023-04-07 NOTE — ED Notes (Addendum)
Pt asked what time was food time, I informed pt that it was not since he slept through snack time. Pt was asked what he wanted to eat and pt responded with a bacon egg and cheese biscuit, pt was informed that we do not have that and was offered a [place] sandwich tray and  orange juice. Pt requesting a towel to put over him while he eats, this tech proceeds to set up [place] sandwich tray and pt feeds himself the contents on the tray.

## 2023-04-07 NOTE — ED Notes (Signed)
Pt is done with sandwich tray and is now requesting a banana, this tech let pt know that we do not have a banana and now pt has covered himself back up and is back resting.

## 2023-04-07 NOTE — ED Notes (Signed)
VOL  TOC  PLACEMENT 

## 2023-04-07 NOTE — ED Notes (Signed)
Head to toe assessment deferred until a tactile sign language person is available.

## 2023-04-07 NOTE — TOC Progression Note (Signed)
Transition of Care [facility]) - Progression Note    Patient Details  Name: Martin [name] MRN: [id number] Date of Birth: 21-Sep-1956  Transition of Care [facility]) CM/SW Contact  Darleene Cleaver, [place] Phone Number: 04/07/2023, 10:36 PM  Clinical Narrative:     Patient has been faxed out to SNFs, awaiting bed offers.  Patient has been added to the DTP list.  Expected Discharge Plan:  (TBD) Barriers to Discharge: Continued Medical Work up  Expected Discharge Plan and Services                                               Social Determinants of Health (SDOH) Interventions SDOH Screenings   Tobacco Use: Low Risk  (12/08/2020)    Readmission Risk Interventions     No data to display

## 2023-04-07 NOTE — ED Notes (Signed)
Various needs addressed throughout the night. RN sat with pt for approx 30 mins x2 throughout the night. Pt sleeping at this time.

## 2023-04-07 NOTE — ED Provider Notes (Signed)
Emergency Medicine Observation Re-evaluation Note  Martin [name] is a 67 y.o. male, seen on rounds today.  Pt initially presented to the ED for complaints of IVC  Currently, the patient is resting in bed. No reported issues from nursing team.   Physical Exam  BP 90/62 (BP Location: Right Arm)   Pulse 78   Temp 98.2 F (36.8 C)   Resp 18   SpO2 96%  Physical Exam General: Resting comfortably in bed  ED Course / MDM  EKG:   Plan  Current plan is for dispo per social work.    Trinna Post, MD 04/07/23 1007

## 2023-04-08 NOTE — ED Notes (Signed)
Patient is vol pending TOC placement 

## 2023-04-08 NOTE — ED Notes (Signed)
Pharmacy tech and interpreter at bedside discussing patients medications at this time.

## 2023-04-08 NOTE — ED Notes (Addendum)
Interpreter leaving at this time. Has interpreter check with patient if he needed anything and he stated no he would let me know if he did or needed to use the urinal. Also checked patient colostomy bag at this time and does not need to be changed at this time. Interpreter will be back tomorrow Sunday (6/30) at approximately 0900.

## 2023-04-08 NOTE — ED Notes (Addendum)
Called pharmacy tech back per patient request to explain to him the reasons why he is on eye drops with the help of the interpreter.

## 2023-04-08 NOTE — ED Notes (Signed)
Martin [name] from interpreter services states that an interpreter will be here on Sat and Sun 9-11

## 2023-04-08 NOTE — ED Notes (Signed)
Dinner tray given to patient at this time.  

## 2023-04-08 NOTE — ED Notes (Signed)
MD at bedside with patient and interpreter informing patient of POC and about a referral for an optometrist post discharge.

## 2023-04-08 NOTE — ED Notes (Signed)
Per previous RN patient was stating he was missing medications he normally gets apparently. This RN called pharmacy to send pharmacy tech to speak with patient and interpreter to try and figure out if the patient is missing any of his normal medications or not.

## 2023-04-08 NOTE — ED Provider Notes (Signed)
-----------------------------------------   8:07 AM on 04/08/2023 -----------------------------------------   Blood pressure 92/60, pulse 77, temperature 97.9 F (36.6 C), temperature source Oral, resp. rate 20, SpO2 99 %.  The patient is calm and cooperative at this time.  There have been no acute events since the last update.  Awaiting disposition plan from [facility] team.   Loleta Rose, MD 04/08/23 [phone]

## 2023-04-08 NOTE — ED Notes (Signed)
Breakfast meal tray given to pt at this time. Setup required.

## 2023-04-08 NOTE — ED Notes (Signed)
Patient provided lunch tray at this time.

## 2023-04-08 NOTE — ED Notes (Signed)
This RN at bedside, pt repositioned in bed. Pt clean and dry at this time.

## 2023-04-09 NOTE — ED Notes (Signed)
Patient was set up with his food tray. He consumed 75% of his meal. Patient linen was changed and was made comfortable. No concerns at this time.

## 2023-04-09 NOTE — ED Notes (Signed)
Pts urinal emptied and brief changed. Pt repositioned in bed to facilitate eating. Towel placed on pts chest per request and sandwich handed to pt. Pt currently feeding himself his snack.

## 2023-04-09 NOTE — ED Notes (Signed)
This RN fed patient at this time. Ate about 75% of meal.

## 2023-04-09 NOTE — ED Notes (Signed)
VS obtained. Pts snack opened for him for ease of consumption. EDT wrote on pts whiteboard and handed whiteboard to pt to make pt aware of his food. Pt looked at the board and nodded his head. Food left on side table within reach of pt.

## 2023-04-09 NOTE — ED Notes (Signed)
TOC

## 2023-04-09 NOTE — ED Notes (Signed)
Pt requesting via interpreter at bedside, that colostomy bag be removed. Pt reports the doctor did it wrong. When informed we cannot reverse a colostomy bag in the hallway, pt reports he is going to sue in court.

## 2023-04-09 NOTE — ED Notes (Signed)
Colostomy bag emptied per patient request. Pad removed from under patient also per patient request.

## 2023-04-10 NOTE — ED Notes (Signed)
Pt sleeping, will obtain vital signs on pt when he wakes up.

## 2023-04-10 NOTE — ED Notes (Signed)
Breakfast tray provided with juice; meal set up for pt and pt oriented to placement of items. Interpreter at the bedside.

## 2023-04-10 NOTE — ED Notes (Signed)
Pt allowed this tech to obtain vital signs on pt.

## 2023-04-10 NOTE — TOC Progression Note (Signed)
Transition of Care [facility]) - Progression Note    Patient Details  Name: Martin [name] MRN: [id number] Date of Birth: [birth year]  Transition of Care [facility]) CM/SW Contact  Darolyn Rua, [place] Phone Number: 04/10/2023, 9:53 AM  Clinical Narrative:     Patient was faxed out to 56 facilities no bed offers at this time.   Expected Discharge Plan:  (TBD) Barriers to Discharge: Continued Medical Work up  Expected Discharge Plan and Services                                               Social Determinants of Health (SDOH) Interventions SDOH Screenings   Tobacco Use: Low Risk  (12/08/2020)    Readmission Risk Interventions     No data to display
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
## 2023-04-10 NOTE — ED Notes (Signed)
Communication through white dry erase. Pt denies needs after drops and snack aided with

## 2023-04-10 NOTE — ED Notes (Addendum)
Dinner tray and beverage provided; tray set up for pt to easily access his meal.

## 2023-04-10 NOTE — ED Notes (Addendum)
Interpreter at the bedside to assist with pt. Pt alert at this time and requesting medications. Awaiting breakfast; pt currently calm and requesting to have pad removed from under his back.

## 2023-04-10 NOTE — ED Notes (Signed)
Pt colostomy bag changed. Pt tolerated well.

## 2023-04-10 NOTE — ED Provider Notes (Signed)
-----------------------------------------   7:32 AM on 04/10/2023 -----------------------------------------   Blood pressure 92/68, pulse 73, temperature 98.1 F (36.7 C), temperature source Oral, resp. rate 14, SpO2 94 %.  The patient is calm and cooperative at this time.  There have been no acute events since the last update.  Awaiting disposition plan from [facility] team.   Loleta Rose, MD 04/10/23 [phone]

## 2023-04-10 NOTE — ED Notes (Signed)
PT with pt and interpreter. Pt able to take full lap around nurses station with steady gait and assistance with walker. Bed linens changed by staff while walking.  Upon returning to hallway bed pt encouraged to shower. Pt refused and requested urinal. Small amount of urine produced.

## 2023-04-10 NOTE — TOC Progression Note (Signed)
Transition of Care [facility]) - Progression Note    Patient Details  Name: Martin [name] MRN: [id number] Date of Birth: 1955/11/30  Transition of Care [facility]) CM/SW Contact  Darleene Cleaver, [place] Phone Number: 04/10/2023, 5:29 PM  Clinical Narrative:     Still no bed offers for patient.  Patient will not meet criteria for [facility].   Expected Discharge Plan:  (TBD) Barriers to Discharge: Continued Medical Work up  Expected Discharge Plan and Services                                               Social Determinants of Health (SDOH) Interventions SDOH Screenings   Tobacco Use: Low Risk  (12/08/2020)    Readmission Risk Interventions     No data to display

## 2023-04-11 MED ORDER — DICLOFENAC SODIUM 75 MG PO TBEC
75.0000 mg | DELAYED_RELEASE_TABLET | Freq: Two times a day (BID) | ORAL | Status: DC
  Administered 2023-04-12 – 2023-04-29 (×27): 75 mg via ORAL
  Filled 2023-04-11 (×40): qty 1

## 2023-04-11 NOTE — ED Notes (Signed)
Colostomy bag changed and stoma cleaned.    

## 2023-04-11 NOTE — ED Provider Notes (Signed)
-----------------------------------------   8:07 AM on 04/11/2023 -----------------------------------------   Blood pressure 91/63, pulse 75, temperature 97.9 F (36.6 C), temperature source Oral, resp. rate 18, SpO2 97 %.  The patient is calm and cooperative at this time.  There have been no acute events since the last update.  Awaiting disposition plan from [facility] team.    Dionne Bucy, MD 04/11/23 [phone]

## 2023-04-11 NOTE — ED Notes (Signed)
Nurse has spent 2 hours with pt as he insists to use person phone to communicate with interpretor and nurse write down multiple addresses and phone numbers. This was done multiple times for pt over this span. Pt unable to get information he wants, this nurse suggested waiting for inperson interpretor but pt kept telling nurse to wait and continued to scroll on phone with no communication. Pt remains on phone scrolling, laying in bed

## 2023-04-12 NOTE — ED Notes (Signed)
Pt given dinner tray and beverage  

## 2023-04-12 NOTE — ED Notes (Signed)
Pt sleeping will retrieve vitals when pt awakes. RN made aware. 

## 2023-04-12 NOTE — ED Notes (Signed)
PT and interpreter at bedside

## 2023-04-12 NOTE — TOC Progression Note (Signed)
Transition of Care [facility]) - Progression Note    Patient Details  Name: Martin [name] MRN: [id number] Date of Birth: 06-Feb-1956  [facility]) CM/SW Contact  Darleene Cleaver, [place] Phone Number: 04/12/2023, 6:52 PM  Clinical Narrative:     CSW still does not have any placement options for patient.  TOC leadership to talk to [facility] and discuss patient returning back even though they are refusing to take him back.  Expected Discharge Plan:  (TBD) Barriers to Discharge: Continued Medical Work up  Expected Discharge Plan and Services                                               Social Determinants of Health (SDOH) Interventions SDOH Screenings   Tobacco Use: Low Risk  (12/08/2020)    Readmission Risk Interventions     No data to display

## 2023-04-12 NOTE — Progress Notes (Signed)
PT Cancellation Note  Patient Details Name: Martin [name] MRN: [id number] DOB: [birth year]   Cancelled Treatment:     PT attempt. Pt asleep upon arrival. Interpreter used to communicate. Pt does awake but refused PT at this time. " I'm too tired. Leave me alone." Author will return later and continue to follow per current POC. RN staff to make author aware if/when pt is more alert and awake to participate.    Rushie Chestnut 04/12/2023, 1:53 PM

## 2023-04-12 NOTE — ED Notes (Addendum)
RN at bedside 1:1 communicating with pt via white board for 1.5 hours. Pt given [place] sandwich tray - after tray was set up pt fed self entire tray independently.

## 2023-04-12 NOTE — ED Provider Notes (Signed)
-----------------------------------------   5:37 AM on 04/12/2023 -----------------------------------------   Blood pressure 105/69, pulse 76, temperature 98 F (36.7 C), temperature source Oral, resp. rate 17, SpO2 98 %.  The patient is calm and cooperative at this time.  There have been no acute events since the last update.  Awaiting disposition plan from Social Work team.   Irean Hong, MD 04/12/23 [phone]

## 2023-04-12 NOTE — ED Notes (Signed)
Vol/toc.. 

## 2023-04-13 NOTE — ED Notes (Signed)
Colostomy pouch change at this time.

## 2023-04-13 NOTE — ED Notes (Signed)
Vol/toc.. 

## 2023-04-13 NOTE — ED Provider Notes (Signed)
BP 110/71   Pulse 78   Temp 98.2 F (36.8 C) (Oral)   Resp 18   SpO2 98%   The patient is calm and cooperative at this time. There have been no acute events since the last update. Awaiting disposition plan from Social Work team.    Willy Eddy, MD 04/13/23 [phone]

## 2023-04-13 NOTE — ED Notes (Addendum)
Dr Marisa Severin called to bedside by RN to evaluate colostomy. Little output this RN's shift, not enough to empty from bag. Stomach taunt but not causing pt discomfort. MD instructed RN to continue to monitor. Pt refused dinner

## 2023-04-13 NOTE — ED Notes (Signed)
Pt eating lunch, interpreter assisted this RN with meal setup and explanation. Pt has no needs right now and interpreter left for the day. Rn will use whiteboard as needed for communication.

## 2023-04-13 NOTE — ED Notes (Addendum)
Patient colostomy bag cleaned out with this RN and Caitlin, NT with normal saline. Patient wiped down with warm wipes. New brief placed and new pillow and pillow case placed under left side of patients back. NT calling various floors at [facility] to see if there are any 4 in colostomy bags which is what the patient currently has placed since that size is not available in the ED. Urine sent to lab.

## 2023-04-14 NOTE — ED Notes (Signed)
EDT Stephanie at bedside to help with dinner. Pt ate meat and did not want anything else.

## 2023-04-14 NOTE — ED Provider Notes (Signed)
-----------------------------------------   5:07 AM on 04/14/2023 -----------------------------------------   Blood pressure 93/62, pulse 76, temperature 98.6 F (37 C), temperature source Oral, resp. rate 16, SpO2 97 %.  The patient is calm and cooperative at this time.  There have been no acute events since the last update.  Awaiting disposition plan from Social Work team.   Irean Hong, MD 04/14/23 [phone]

## 2023-04-14 NOTE — ED Notes (Signed)
Assisted pt with urinal, emptied urinal 200 ML.

## 2023-04-14 NOTE — Consult Note (Signed)
WOC Nurse ostomy consult note Stoma type/location: LLQ colostomy, seen during this admission 03/31/23 by [facility] nurse.  History of prolapse that has been non problematic previously.  Output brown stool reported in notes  Ostomy pouching: 2pc. 4" Hart [place] # [id number] is what patient will need during this stay for maintaining his ostomy due to prolapse and size of stoma. Barrier rings Hart [place] # [phone] also provide support for extended life of pouching  Maintain pouching change 2x week per bedside nursing. Patient is completely dependent in care  Education provided: NA Discussed needs and re-educated on materials/[place] #s needed with ED staff, they do not have to order supplies routinely like  this so may be valuable to hang some type of sign in the room with supply numbers and always order ahead.    Additionally emptying pouch rather than removing and tossing is helpful   Barth Trella [facility], CNS, CWON-AP [phone]

## 2023-04-14 NOTE — ED Notes (Signed)
Pt ostomy bag changed by this RN and Gaffer. Pt previous bag noted to be leaking with stool spilling out. Pt cleaned, new brief and linens placed. New ostomy bag not sealing correctly on pt lower left side. Replaced new bag to get to stick better, will consult wound/ostomy care for more assistance.

## 2023-04-14 NOTE — ED Notes (Signed)
Pt used urinal to void, asked pt if he would like to eat, pt states not right now.

## 2023-04-14 NOTE — ED Notes (Signed)
Pt asking to take a shower, this RN offered bed bath and told pt we could try to get him up and showered tomorrow. Pt also asking for shirt and socks to be changed as well as cleaning his feet stating "they're dirty." This RN washed feet, applied lotion and replaced socks. Gave pt a blue scrub top but pt refused saying he wants a long sleeve shirt. Informed pt we do not have long sleeves but we could cover with more warm blankets. Pt refused again at this time.

## 2023-04-14 NOTE — ED Notes (Signed)
Pt assisted with urinal
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
## 2023-04-15 NOTE — ED Notes (Signed)
Patient is sleeping right now while lunch is being passed out.

## 2023-04-15 NOTE — ED Notes (Signed)
Richard, the interpreter, has arrived this time.

## 2023-04-15 NOTE — ED Notes (Addendum)
Richard, the interpreter, has left for the day.

## 2023-04-15 NOTE — ED Notes (Signed)
Assisted pt with urinal

## 2023-04-15 NOTE — ED Notes (Signed)
Pt is refusing vital signs at this time. Pt is moderately anxious and wants to get up and walk around the ED at this time. Atarax was given for anxiety and trazodone is for sleep since the pt has not been able to go back to sleep since 2220 tonight.

## 2023-04-15 NOTE — ED Notes (Signed)
Pt resting comfortably in bed. Rise and fall of the chest noted. Even and unlabored respirations.

## 2023-04-15 NOTE — ED Notes (Signed)
Pt given sandwich tray and water. No other needs expressed at this time

## 2023-04-16 NOTE — ED Notes (Signed)
Breakfast tray given. °

## 2023-04-16 NOTE — ED Provider Notes (Signed)
-----------------------------------------   6:23 AM on 04/16/2023 -----------------------------------------   Blood pressure (!) 100/57, pulse 81, temperature 98 F (36.7 C), temperature source Oral, resp. rate 18, SpO2 97 %.  The patient is calm and cooperative at this time.  There have been no acute events since the last update.  Awaiting disposition plan from case management/social work.    Corena Herter, MD 04/16/23 [phone]

## 2023-04-16 NOTE — ED Notes (Signed)
Hospital meal provided, pt tolerated w/o complaints.  Waste discarded appropriately.  
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
## 2023-04-17 NOTE — ED Notes (Signed)
Pt provided lunch tray and water

## 2023-04-17 NOTE — ED Notes (Signed)
Pt given breakfast and juice. Meal tray set up for pt so he is able to feed himself. Interpreter at the bedside.

## 2023-04-17 NOTE — TOC Progression Note (Signed)
Transition of Care [facility]) - Progression Note    Patient Details  Name: Martin [name] MRN: [id number] Date of Birth: 06-Nov-1955  Transition of Care [facility]) CM/SW Contact  Darleene Cleaver, [place] Phone Number: 04/17/2023, 6:08 PM  Clinical Narrative:    Patient's information has been sent to 56 SNFs, 40 have said no to accepting patient.   Expected Discharge Plan:  (TBD) Barriers to Discharge: Continued Medical Work up  Expected Discharge Plan and Services                                               Social Determinants of Health (SDOH) Interventions SDOH Screenings   Tobacco Use: Low Risk  (12/08/2020)    Readmission Risk Interventions     No data to display

## 2023-04-17 NOTE — ED Notes (Signed)
Pt walking with PT and interpreter. Tolerating well.

## 2023-04-17 NOTE — ED Notes (Signed)
Dinner tray provided

## 2023-04-17 NOTE — ED Notes (Signed)
Provided pt for comfort and safety. Interpreter left and pt resting quietly with table at the bedside with pt belongings.

## 2023-04-18 NOTE — ED Notes (Signed)
Pt refused pm snack  

## 2023-04-18 NOTE — ED Notes (Signed)
This tech and LaShaunda NT bathed pt, changed his linen, and put on a new brief. We applied lotion to the pt's back and legs. The pt is now resting comfortably.

## 2023-04-18 NOTE — ED Notes (Signed)
Pt has refused to take po meds since offered.

## 2023-04-18 NOTE — ED Notes (Signed)
Pt resting in bed, requesting time of drops and interpretor. Communicated via white board. No complaints at this time. Resting with needs in reach via bedside table.

## 2023-04-18 NOTE — ED Notes (Signed)
Hospital meal provided.  100% consumed, pt tolerated w/o complaints.  Waste discarded appropriately.   

## 2023-04-18 NOTE — ED Notes (Signed)
Ostomy bag replaced. Pt site clean. Skin cleansed.

## 2023-04-18 NOTE — ED Notes (Signed)
Pt decided to eat at this time. Meal tray provided and pt assisted with meal.

## 2023-04-18 NOTE — ED Notes (Signed)
Vol/toc.. 

## 2023-04-18 NOTE — ED Notes (Signed)
This tech and Industrial/product designer offered food and bath twice.

## 2023-04-18 NOTE — ED Notes (Signed)
Pt given dinner tray and beverage  
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
## 2023-04-19 NOTE — ED Notes (Signed)
Patient is currently sleeping, will collect vitals when awake.

## 2023-04-19 NOTE — ED Notes (Signed)
Patient has interpreter by bedside, she is very helpful and Patient responds well to her.

## 2023-04-19 NOTE — ED Notes (Signed)
Patient did ambulate with assist of PT, no signs of distress. Patient is safe, staff will continue to monitor for safety.
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
## 2023-04-19 NOTE — ED Notes (Signed)
Patient is still sleeping. 

## 2023-04-19 NOTE — ED Provider Notes (Signed)
Emergency Medicine Observation Re-evaluation Note  Martin [name] is a 67 y.o. male, seen on rounds today.  Pt initially presented to the ED for complaints of Placement/Boarder  Currently, the patient is is no acute distress. Denies any concerns at this time.  Physical Exam  Blood pressure 91/60, pulse 68, temperature (!) 97.5 F (36.4 C), temperature source Oral, resp. rate 20, SpO2 96 %.  Physical Exam: General: No apparent distress Psych: Resting comfortably -in the hallway     ED Course / MDM     I have reviewed the labs performed to date as well as medications administered while in observation.  Recent changes in the last 24 hours include: No acute events overnight.  Plan   Current plan: Patient awaiting social work disposition    Corena Herter, MD 04/19/23 1439

## 2023-04-20 MED ORDER — POLYVINYL ALCOHOL 1.4 % OP SOLN
2.0000 [drp] | OPHTHALMIC | Status: DC | PRN
  Administered 2023-04-21 – 2023-07-17 (×18): 2 [drp] via OPHTHALMIC
  Filled 2023-04-20 (×5): qty 15

## 2023-04-20 NOTE — ED Notes (Addendum)
Pt fed by staff at this time.

## 2023-04-20 NOTE — ED Notes (Signed)
Writer tried multiple times to give patient HS PO medication. Patient refused and swatted at [employer].

## 2023-04-20 NOTE — ED Notes (Signed)
Lunch tray provided. Pt refused to eat

## 2023-04-20 NOTE — ED Notes (Signed)
Assumed care of patient. Patient is asleep in bed.  

## 2023-04-20 NOTE — ED Notes (Signed)
Patient requesting 10am eye drops be placed in eyes.  Writer explained by writing on white board I could not give at this time due to it being to early.

## 2023-04-20 NOTE — Progress Notes (Signed)
PT Cancellation Note  Patient Details Name: Martin [name] MRN: [id number] DOB: [birth year]   Cancelled Treatment:     PT attempt. No interpreter present at this time. Will return later today when in person ASL interpreter is present. RN tech to inform Thereasa Parkin when they arrive.    Rushie Chestnut 04/20/2023, 10:05 AM

## 2023-04-20 NOTE — TOC Progression Note (Addendum)
Transition of Care [facility]) - Progression Note    Patient Details  Name: Corben Auzenne MRN: [id number] Date of Birth: 10-Aug-1956  [facility] of [facility]) CM/SW Contact  Darleene Cleaver, [place] Phone Number: 04/20/2023, 9:36 AM  Clinical Narrative:     Patient still does not have any bed offers due to patient being blind and deaf.  CSW spoke to [facility] again to try to convince them to accept patient back, they will not accept back due to his behaviors and patient not wanting to return.  CSW received contact information for [facility] regional director to see if they have facilities that can accept patient.  Will also try to contact Tammy from [facility] to see if they have any beds available.  Expected Discharge Plan:  (TBD) Barriers to Discharge: Continued Medical Work up  Expected Discharge Plan and Services                                               Social Determinants of Health (SDOH) Interventions SDOH Screenings   Tobacco Use: Low Risk  (04/06/2023)   Received from [facility]    Readmission Risk Interventions     No data to display

## 2023-04-20 NOTE — ED Notes (Signed)
Patient woke up used urinal. Writer emptied urinal.  Patient then ate his dinner tray.

## 2023-04-20 NOTE — ED Notes (Signed)
Pt colostomy bag emptied at this time.

## 2023-04-21 NOTE — ED Notes (Signed)
Pt assisted with urinal.  Eating breakfast assisted with set up

## 2023-04-21 NOTE — ED Notes (Signed)
Vol  pending  TOC  placement 

## 2023-04-21 NOTE — ED Notes (Signed)
Pt declining dinner at this time

## 2023-04-21 NOTE — ED Notes (Signed)
PT at bedside, pt hesitant to participate.

## 2023-04-21 NOTE — ED Notes (Signed)
Patient was given artifical tears for dryness per request. I reminded the patient that his other eye drops are not due until 10am. Patients colostomy bag was changed. Old blankets were removed and patient was given warm blankets.

## 2023-04-21 NOTE — ED Notes (Signed)
Pt c/o swelling to bilateral feet and pain to bilateral toe nails. No swelling noted. Attempted to elevate feet, pt declined. Dr Scotty Court notified.

## 2023-04-21 NOTE — ED Provider Notes (Signed)
-----------------------------------------   6:22 AM on 04/21/2023 -----------------------------------------   Blood pressure 100/60, pulse 76, temperature 97.8 F (36.6 C), temperature source Oral, resp. rate 17, SpO2 95%.  The patient is calm and cooperative at this time.  There have been no acute events since the last update.  Awaiting disposition plan from Social Work team.   Irean Hong, MD 04/21/23 [phone]

## 2023-04-21 NOTE — ED Notes (Signed)
Patient bedside table was cleaned. Old food was removed.

## 2023-04-21 NOTE — ED Notes (Signed)
Pt requesting social work come see him Tuesday next week. Pt also reports that he is willing to live in [place] as well. Minerva Areola, from social work, updated and reports they will visit next Tuesday if interpreter available to discuss plan. Pt reports understanding. Conversation via interpreter at bedside

## 2023-04-21 NOTE — ED Notes (Signed)
Patient was given the urinal to void.

## 2023-04-21 NOTE — Progress Notes (Signed)
PT Cancellation Note  Patient Details Name: Martin [name] MRN: [id number] DOB: [birth year]   Cancelled Treatment:    Reason Eval/Treat Not Completed: Other (comment) (Chart reviewed, treatment attempted, Author coordinated visit with presence of ASL interpreter familiar to patient.) Spent ~10 minutes meeting with patient and ASL interpreter Amy in hallway at bedside. Pt reports having a difficult day. He expresses frustration that he is continually pressured into mobility on a painful knee that has caused problems with mobility for a long time. Pt expresses a lot of social and personal stress, "pressure", anxiety regarding his current situation and prolonged ED stay, limited available options for DC. Pt declines offers for a variety of PT treatment options, becomes more frustrated and asks to be left alone multiple time. Author tried to engage patient in conversation about long-term plan for meeting basic mobility needs, I.e. use of W/C or other strategies both ergonomic and antalgic. Pt too occupied to have this conversation at present, asked to be left alone. Will continue to follow.   2:45 PM, 04/21/23 Rosamaria Lints, PT, DPT Physical Therapist - [facility]  [phone] (ASCOM)     [name] 04/21/2023, 2:41 PM

## 2023-04-22 NOTE — ED Notes (Addendum)
Pt alert; pt using urinal. Pt's colostomy bag currently empty; C/D/I.

## 2023-04-22 NOTE — ED Notes (Signed)
Was going to prompt pt to eat his dinner but pt asleep again; will prompt the next time pt is awake. Heather RN states pt ate most or all of breakfast and lunch and typically notifies staff when he is hungry.

## 2023-04-22 NOTE — ED Notes (Signed)
Pt's dinner tray arrived to ER. Will provide to pt when he wakes.

## 2023-04-22 NOTE — ED Provider Notes (Signed)
Emergency Medicine Observation Re-evaluation Note  Martin [name] is a 67 y.o. male, seen on rounds today.  Pt initially presented to the ED for complaints of Placement/Boarder Currently, the patient is awaiting placement.  Physical Exam  BP 92/61 (BP Location: Right Arm)   Pulse 66   Temp 98.2 F (36.8 C) (Oral)   Resp 17   SpO2 97%  Physical Exam General: resting comfortably   ED Course / MDM   No new labs in past 24 hours  Plan  Current plan is for placement.    Phineas Semen, MD 04/22/23 [phone]

## 2023-04-22 NOTE — ED Notes (Signed)
Vol/toc.. 

## 2023-04-22 NOTE — ED Notes (Signed)
Pt able to feed self once tray set up

## 2023-04-22 NOTE — ED Notes (Signed)
This tech obtained vital signs on pt.  

## 2023-04-23 NOTE — ED Notes (Signed)
Pt's colostomy bag emptied of medium amount of stool. Cleansed. Mark interpreter assisted with communication. Pt denies any current needs. Interpreter will be by around 8am tomorrow per [employer].

## 2023-04-23 NOTE — ED Notes (Signed)
Pt used whiteboard to notify this RN he is going to sleep now; explained to pt it is only 1430. Pt confirmed he understands and is going to sleep now.

## 2023-04-23 NOTE — ED Notes (Signed)
Pt alert, c/c with staff; pt currently cleaning his eyes and glasses.

## 2023-04-23 NOTE — ED Provider Notes (Signed)
-----------------------------------------   6:26 AM on 04/23/2023 -----------------------------------------   Blood pressure 90/69, pulse 75, temperature 97.9 F (36.6 C), temperature source Oral, resp. rate 20, SpO2 96%.  The patient is calm and cooperative at this time.  There have been no acute events since the last update.  Awaiting disposition plan from Social Work team.   Irean Hong, MD 04/23/23 [phone]

## 2023-04-23 NOTE — ED Notes (Signed)
Pt used whiteboard to communicate needs to this RN. Provided.

## 2023-04-23 NOTE — ED Notes (Signed)
New sandwich provided due to pt changing his mind and not wanting mayo on his sandwich. Pt currently sitting upright in bed with towel over chest, per request, eating his new sandwich with no mayo.

## 2023-04-23 NOTE — ED Notes (Signed)
Pt eating from breakfast tray.

## 2023-04-23 NOTE — ED Notes (Signed)
Pt alert, resting calmly in bed. Pt reports would like to use his PRN eye drops again after lunch.

## 2023-04-23 NOTE — ED Notes (Signed)
NT emptied pt's urinal.

## 2023-04-23 NOTE — ED Notes (Signed)
Interpreter at bedside to visit with and check on pt.

## 2023-04-23 NOTE — ED Notes (Signed)
Pt's blankets adjusted at his request.

## 2023-04-23 NOTE — ED Notes (Signed)
Pt received lunch tray and drink.  

## 2023-04-23 NOTE — ED Notes (Signed)
VS obtained and trash disposed of from pts bedside. Bed bath provided to pt and pts skin lotioned with moisture cream to prevent skin breakdown. Brief changed as well. Fresh pillowcases and blankets applied to bed and heels floated per pt request. All snack items prepared and opened for pt and pt repositioned to facilitate eating. Pt denied any further needs at this time.

## 2023-04-23 NOTE — ED Notes (Signed)
Pt's dinner tray and drink placed on bedside table. Pt currently alert; calm.
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
## 2023-04-24 NOTE — ED Notes (Signed)
Pt c/o bilateral foot pain, reports keeping him from participating in PT. Dr Rosalia Hammers notified. No swelling noted.

## 2023-04-24 NOTE — ED Provider Notes (Signed)
Emergency Medicine Observation Re-evaluation Note  Martin [name] is a 67 y.o. male, seen on rounds today.  Pt initially presented to the ED for complaints of Placement/Boarder  Currently, the patient is resting in bed. Nursing reported bilateral foot pain.   Physical Exam  BP 100/62 (BP Location: Left Arm)   Pulse 64   Temp 98 F (36.7 C) (Oral)   Resp 14   SpO2 98%  Physical Exam General: Resting comfortably in bed MSK: No open wounds over the feet, sensation and motor intact, long nails but no surrounding erythema or discharge around the nail bed  ED Course / MDM  EKG:   No labs last 24 hours  Plan  Current plan is for dispo per social work. Discussed trialing warm water soaks for foot pain with nurse.     Trinna Post, MD 04/24/23 [phone]

## 2023-04-24 NOTE — ED Notes (Signed)
Pt sleep, will obtain vitals and offer snack when pt wakes up.

## 2023-04-24 NOTE — ED Notes (Signed)
Social work and sign language interpreter at the bedside for meeting with pt regarding plan of care.

## 2023-04-24 NOTE — TOC Progression Note (Incomplete Revision)
Transition of Care [facility]) - Progression Note    Patient Details  Name: Martin [name] MRN: [id number] Date of Birth: 1956/02/14  Transition of Care [facility]) CM/SW Contact  Darleene Cleaver, [place] Phone Number: 04/24/2023, 8:52 AM  Clinical Narrative:     CSW to contact [facility] to see if they can review patient for any of their facilities.  CSW to also try to contact [facility] to see if any of their other facilities can accept.  11:45am CSW spoke to Jacques Earthly for [employer], she is going to review patient again to see if any of their other facilities can accept patient.   Expected Discharge Plan:  (TBD) Barriers to Discharge: Continued Medical Work up  Expected Discharge Plan and Services                                               Social Determinants of Health (SDOH) Interventions SDOH Screenings   Tobacco Use: Low Risk  (04/06/2023)   Received from [facility]    Readmission Risk Interventions     No data to display

## 2023-04-24 NOTE — ED Notes (Signed)
Given lunch tray, assisted with set up

## 2023-04-24 NOTE — ED Notes (Signed)
Urinal emptied

## 2023-04-24 NOTE — Progress Notes (Signed)
PT Cancellation Note  Patient Details Name: Martin [name] MRN: [id number] DOB: Aug 15, 1956   Cancelled Treatment:    Reason Eval/Treat Not Completed: Patient declined, no reason specified Attempted to see pt for PT tx with professional interpreter present Cindi Carbon). Pt received in bed noting BUE cramps, pain on BLE feet - nurse aware. PT encouraged pt to sit EOB to at least eat breakfast but pt declines. PT educates pt on importance of participating in PT session to prevent functional decline/weakness, even offered for pt to stay mobile at w/c level (2/2 pt c/o chronic LLE pain) but pt declines this. Pt continues to decline PT efforts. Will f/u as able.  Aleda Grana, PT, DPT 04/24/23, 9:49 AM   Sandi Mariscal 04/24/2023, 9:43 AM

## 2023-04-24 NOTE — ED Notes (Signed)
Urinal provided by [employer]. Pt able to use urinal without difficulty.

## 2023-04-24 NOTE — ED Notes (Signed)
Pt soaking feet in warm water, per Dr Rosalia Hammers

## 2023-04-24 NOTE — ED Notes (Signed)
Dietary contacted to place specific orders for lunch, dinner and breakfast tomorrow. Communicated via interpreter.

## 2023-04-25 LAB — BASIC METABOLIC PANEL
Anion gap: 7 (ref 5–15)
BUN: 33 mg/dL — ABNORMAL HIGH (ref 8–23)
CO2: 30 mmol/L (ref 22–32)
Calcium: 9.3 mg/dL (ref 8.9–10.3)
Chloride: 99 mmol/L (ref 98–111)
Creatinine, Ser: 1.16 mg/dL (ref 0.61–1.24)
GFR, Estimated: >60 mL/min (ref >60)
Glucose, Bld: 102 mg/dL — ABNORMAL HIGH (ref 70–99)
Potassium: 4.6 mmol/L (ref 3.5–5.1)
Sodium: 136 mmol/L (ref 135–145)

## 2023-04-25 NOTE — ED Notes (Signed)
Dinner meal tray given at this time.  

## 2023-04-25 NOTE — ED Notes (Signed)
Vol/toc.. 

## 2023-04-25 NOTE — ED Notes (Signed)
Pt is resting with eyes closed. Food is kept at bedside for when patient awakes

## 2023-04-25 NOTE — ED Notes (Signed)
Hospital meal provided.  100% consumed, pt tolerated w/o complaints.  Waste discarded appropriately.   

## 2023-04-25 NOTE — ED Notes (Signed)
Attempted to collect blood specimen x2 - unsuccessful. Pt refusing any further assessments.

## 2023-04-25 NOTE — ED Provider Notes (Signed)
Emergency Medicine Observation Re-evaluation Note  Physical Exam   BP 101/66 (BP Location: Right Arm)   Pulse 72   Temp 98.6 F (37 C) (Oral)   Resp 18   SpO2 97%   Patient appears in no acute distress.  ED Course / MDM   No reported events during my shift at the time of this note.   Pt is awaiting dispo from SW   Pilar Jarvis MD    Pilar Jarvis, MD 04/25/23 [phone]

## 2023-04-25 NOTE — ED Notes (Signed)
Pt states that his eye

## 2023-04-25 NOTE — ED Notes (Signed)
Juice given to pt.

## 2023-04-25 NOTE — ED Notes (Signed)
Interpreter at bedside.

## 2023-04-25 NOTE — ED Notes (Signed)
Pt given breakfast tray and beverage.  

## 2023-04-25 NOTE — ED Notes (Signed)
Rainbow sent to lab if needed.

## 2023-04-25 NOTE — ED Notes (Signed)
 Report received from Ally, RN.

## 2023-04-26 NOTE — ED Notes (Signed)
Patient woke up wanting his dinner tray. Assisted with setting up dinner tray. Patient ate about 30% of food. Now laying down quietly.

## 2023-04-26 NOTE — ED Notes (Signed)
Hospital meal provided.  100% consumed, pt tolerated w/o complaints.  Waste discarded appropriately.   
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
## 2023-04-26 NOTE — ED Provider Notes (Signed)
-----------------------------------------   5:15 AM on 04/26/2023 -----------------------------------------   Blood pressure 101/66, pulse 72, temperature 98.6 F (37 C), temperature source Oral, resp. rate 18, SpO2 97%.  The patient is calm and cooperative at this time.  There have been no acute events since the last update.  Awaiting disposition plan from [facility] team.   Loleta Rose, MD 04/26/23 [phone]

## 2023-04-26 NOTE — ED Notes (Addendum)
Interpreter completed visit. Meds given, linen changed, colostomy bag changed, and PT completed visit while interpreter here. Lunch ordered. Pt expresses no further needs.

## 2023-04-27 LAB — COMPREHENSIVE METABOLIC PANEL
ALT: 30 U/L (ref 0–44)
AST: 51 U/L — ABNORMAL HIGH (ref 15–41)
Albumin: 4.2 g/dL (ref 3.5–5.0)
Alkaline Phosphatase: 55 U/L (ref 38–126)
Anion gap: 9 (ref 5–15)
BUN: 41 mg/dL — ABNORMAL HIGH (ref 8–23)
CO2: 28 mmol/L (ref 22–32)
Calcium: 9.4 mg/dL (ref 8.9–10.3)
Chloride: 99 mmol/L (ref 98–111)
Creatinine, Ser: 1.59 mg/dL — ABNORMAL HIGH (ref 0.61–1.24)
GFR, Estimated: 48 mL/min — ABNORMAL LOW (ref >60)
Glucose, Bld: 107 mg/dL — ABNORMAL HIGH (ref 70–99)
Potassium: 4.6 mmol/L (ref 3.5–5.1)
Sodium: 136 mmol/L (ref 135–145)
Total Bilirubin: 0.4 mg/dL (ref 0.3–1.2)
Total Protein: 8.1 g/dL (ref 6.5–8.1)

## 2023-04-27 LAB — CBC WITH DIFFERENTIAL/PLATELET
Abs Immature Granulocytes: 0.01 10*3/uL (ref 0.00–0.07)
Basophils Absolute: 0 10*3/uL (ref 0.0–0.1)
Basophils Relative: 1 %
Eosinophils Absolute: 0.1 10*3/uL (ref 0.0–0.5)
Eosinophils Relative: 4 %
HCT: 28 % — ABNORMAL LOW (ref 39.0–52.0)
Hemoglobin: 8.9 g/dL — ABNORMAL LOW (ref 13.0–17.0)
Immature Granulocytes: 0 %
Lymphocytes Relative: 34 %
Lymphs Abs: 1.3 10*3/uL (ref 0.7–4.0)
MCH: 30.5 pg (ref 26.0–34.0)
MCHC: 31.8 g/dL (ref 30.0–36.0)
MCV: 95.9 fL (ref 80.0–100.0)
Monocytes Absolute: 0.3 10*3/uL (ref 0.1–1.0)
Monocytes Relative: 9 %
Neutro Abs: 2 10*3/uL (ref 1.7–7.7)
Neutrophils Relative %: 52 %
Platelets: 143 10*3/uL — ABNORMAL LOW (ref 150–400)
RBC: 2.92 MIL/uL — ABNORMAL LOW (ref 4.22–5.81)
RDW: 16.2 % — ABNORMAL HIGH (ref 11.5–15.5)
WBC: 3.8 10*3/uL — ABNORMAL LOW (ref 4.0–10.5)
nRBC: 0 % (ref 0.0–0.2)

## 2023-04-27 LAB — LACTIC ACID, PLASMA: Lactic Acid, Venous: 1.4 mmol/L (ref 0.5–1.9)

## 2023-04-27 MED ORDER — SODIUM CHLORIDE 0.9 % IV BOLUS: 1000.0000 mL | Freq: Once | INTRAVENOUS | Status: DC

## 2023-04-27 MED ORDER — TROLAMINE SALICYLATE 10 % EX CREA
TOPICAL_CREAM | Freq: Two times a day (BID) | CUTANEOUS | Status: DC | PRN
  Filled 2023-04-27: qty 85

## 2023-04-27 NOTE — ED Notes (Signed)
Pt ate 75%of meal ?

## 2023-04-27 NOTE — ED Notes (Addendum)
Hospital meal provided.  100% consumed, pt tolerated w/o complaints.  Waste discarded appropriately.    Interpreter completed visit; pt expressed needs to nursing staff while here, no further needs at this time.

## 2023-04-27 NOTE — TOC Progression Note (Signed)
Transition of Care [facility]) - Progression Note    Patient Details  Name: Martin [name] MRN: [id number] Date of Birth: 1956/07/27  Transition of Care [facility]) CM/SW Contact  Darleene Cleaver, [place] Phone Number: 04/26/2023, 6:29 PM  Clinical Narrative:    Spoke to Tammy from [employer], she does not have a facility that can accept patient.  She suggested CSW call Olegario Messier from [facility] to see if she has facilities that can accept patient, left a message on her voice mail.  CSW also called [place], [phone] clinical liaison from [facility] to see if she has a facility that can accept patient, waiting to hear back from her.  CSW to continue to follow for placement in a SNF.    Expected Discharge Plan:  (TBD) Barriers to Discharge: Continued Medical Work up  Expected Discharge Plan and Services                                               Social Determinants of Health (SDOH) Interventions SDOH Screenings   Tobacco Use: Low Risk  (04/06/2023)   Received from [facility]    Readmission Risk Interventions     No data to display

## 2023-04-27 NOTE — Progress Notes (Signed)
PT Cancellation Note  Patient Details Name: Martin [name] MRN: [id number] DOB: 01-03-1956   Cancelled Treatment:    Reason Eval/Treat Not Completed:  (interpreter unable to stay for full session.)  Attempted to see pt, however interpreter stated he had to be at another appointment, and would be best to see pt tomorrow (04/28/23) morning.  Pt just received food and had pain cream donned onto knees, with interpret stating he would likely not work with therapy at this time as well.  Will re-attempt tomorrow morning @ 8:30 AM when interpreter is planning on being present.   Nolon Bussing, PT, DPT Physical Therapist - [facility]  04/27/23, 3:23 PM

## 2023-04-27 NOTE — ED Notes (Signed)
RN requested for this RN to attempt IV access on pt. RN presented to pt room and wrote on pt personal white board to explain what she was attempting to do and the purpose. Pt initially nodded consent and provided RN with R arm to look. Pt would not allow RN to look on L arm despite 2 previous attempts by primary RN on R. Pt will only point to one spot on R arm that he will allow staff to look. RN explained to pt that previous attempts in that spot were not successful and had blown per primary RN. Pt nodded understanding but when RN attempts to tie tourniquet or position pt arm pt yells and jerks arm back, refusing RN to look. RN explained via white board that she was just trying to position the arm in order to again look at preferred spot of pt and in order to do so she would indeed have to touch the pt arm. Pt yells inchorently and throws the white board. RN made several further attempts to communicate with pt and explain importance and pt refuses to read communication on the white board. Pt did eventually write " don't make me mad or stress me, leave me alone." RN  made one final attempt to communicate with pt and pt again refused to read message on white board. Primary RN and EDT Latonya in room to witness exchange and pt refusal of IV placement.

## 2023-04-27 NOTE — ED Notes (Signed)
Interpreter is present at the beside

## 2023-04-27 NOTE — ED Notes (Signed)
Pt requested different food than what was sent from dietary, new order placed with dietary

## 2023-04-27 NOTE — ED Notes (Addendum)
Pt BP continues to trend down. Requested an second RN to talk to pt and attempt second IV place. This RN at bedside. Unable to obtain IV access. Please see RN McKenzie note. Pt requests to be left alone. Has thrown white board and refusing communication. He retracted arms under the covers. Pt remains on VS monitor. Charge RN Eliberto Ivory and attending informed of pts continued refusal.

## 2023-04-27 NOTE — ED Notes (Signed)
Pt breakfast set up for him to eat, pt currently eating at this time

## 2023-04-27 NOTE — ED Notes (Signed)
Pt is a boarder in the ED awaiting SNF placement. Was found to be hypotensive with SBP 70s. Was moved into room 19 for medical treatment and screening. Report received by previous RN. Pt communicates with a white board at bedside. Informed pt of reasoning behind move, plan to get blood, give IVF, and monitor VS. Pt was complaint with plan. Attempted IV placement x2 without success. First to right anterior forearm difficult d/t pt movement, was able to obtain blood, removed d/t swelling with flushing line. Pt was cooperative with second attempt. Was unwilling to attempt an left arm. Requested use of right arm and same site. Attempted IV placement above site of previous attempt. Unsuccessful. Pt no longer cooperative with IV placement. Retracted arms under blankets. EDP Dr. Penne Lash was informed of inability to start IVF and current BP 80/62. Requests to encourage PO fluids. Water taken to bedside. Pt took one sip and stopped drinking. Pt remains on VS monitor.
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
## 2023-04-28 NOTE — TOC Progression Note (Signed)
Transition of Care [facility]) - Progression Note    Patient Details  Name: Martin [name] MRN: [id number] Date of Birth: 11-21-1955  Transition of Care [facility]) CM/SW Contact  Darleene Cleaver, [place] Phone Number: 04/28/2023, 6:53 PM  Clinical Narrative:     CSW spoke to [place] [phone], clinical liaison from [facility], she will review patient and see if any of her facilities can accept patient.  CSW also spoke to [place], [phone] from [facility] healthcare group, she will check with her facilities to see if they are able to accept patient for LTC Medicaid bed.  CSW waiting for response back from SNF reps.  Expected Discharge Plan:  (TBD) Barriers to Discharge: Continued Medical Work up  Expected Discharge Plan and Services                                               Social Determinants of Health (SDOH) Interventions SDOH Screenings   Tobacco Use: Low Risk  (04/06/2023)   Received from [facility]    Readmission Risk Interventions     No data to display

## 2023-04-28 NOTE — ED Notes (Signed)
Assisted pt with emptying urinal 100 ML. Pt drew on board asking what time is breakfast and eye drops, wrote on board to advise pt it is almost 6am currently and breakfast will arrive around 8:30am and eye drops around 9:00pm per RN.

## 2023-04-28 NOTE — ED Provider Notes (Signed)
Patient has been having downtrending blood pressures throughout the day.  Maps greater than 65.  He is refusing IV fluids and refusing to eat currently.  Keep suctioning stuff out of the room but is in no acute distress.  Blood work was repeated and it does not show any markers of sepsis.  Mild dehydration.  Encouraged p.o.  Still awaiting SNF.  Very difficult situation.   Willy Eddy, MD 04/28/23 [phone]

## 2023-04-29 ENCOUNTER — Other Ambulatory Visit: Payer: Self-pay

## 2023-04-29 NOTE — ED Provider Notes (Signed)
Emergency Medicine Observation Re-evaluation Note  Physical Exam   BP (!) 81/62   Pulse 69   Temp 98.6 F (37 C)   Resp 18   SpO2 95%   Patient appears in no acute distress.  ED Course / MDM   No reported events during my shift at the time of this note.   Pt is awaiting dispo from SW   Pilar Jarvis MD    Pilar Jarvis, MD 04/29/23 [phone]

## 2023-04-29 NOTE — ED Notes (Signed)
Dinner tray placed on bedside table by [employer], pt currently asleep.

## 2023-04-29 NOTE — ED Notes (Signed)
Woke pt to obtain full set of vitals, pt refusing to allow O2 sensor on finger. Pt was also asked if he wanted to eat dinner however pt refused to read white board. Dinner tray sitting on bedside table.

## 2023-04-30 MED ORDER — DICLOFENAC SODIUM 1 % EX GEL
2.0000 g | Freq: Four times a day (QID) | CUTANEOUS | Status: DC | PRN
  Administered 2023-07-20 – 2023-08-13 (×2): 2 g via TOPICAL
  Filled 2023-04-30 (×2): qty 100

## 2023-04-30 NOTE — ED Provider Notes (Signed)
Emergency Medicine Observation Re-evaluation Note  Martin [name] is a 67 y.o. male, seen on rounds today.  Pt initially presented to the ED for complaints of Placement/Boarder Currently, the patient is resting comfortably.  Physical Exam  BP 108/74   Pulse 69   Temp 98.6 F (37 C)   Resp 11   SpO2 95%  Physical Exam Patient appears well, no acute distress, normal WOB    ED Course / MDM  EKG:   I have reviewed the labs performed to date as well as medications administered while in observation.  Recent changes in the last 24 hours include none.  Plan  Current plan is for dispo per social work.    Chesley Noon, MD 04/30/23 [phone]

## 2023-04-30 NOTE — ED Notes (Signed)
Patient threw salad cup into hallway. Yelling in room at this time

## 2023-04-30 NOTE — ED Notes (Signed)
This narrator went into the pt room to readjust pt bp cuff correctly, but pt refused and repositoning of his bp cuff and also reapplied his bp cuff himself the incorrect position. Pt is currently in bed laying quietly. Respirations are even and unlabored. No distress observed.

## 2023-04-30 NOTE — ED Notes (Signed)
This RN got patients cuff on and he took it off. Patient now refusing to let RN put cuff back on. Refuses to read white board and communicate with staff

## 2023-04-30 NOTE — ED Notes (Signed)
Attempted to obtain BP. Pt informed RN that the cuff was getting too tight. RN educated pt that we needed BP. Pt refused. Pt sat up for breakfast and informed on where food was located on plate. Pt sitting up in bed feeding himself with spoon.

## 2023-04-30 NOTE — ED Notes (Signed)
RN and ASL interpreter at bedside. Pt given regularly scheduled medications. Pt slid up in bed and positioned for comfort. Pt compliant at this time. Colostomy bag changed. Pt given lunch. Pt took approximately two bites and then said that he was finished. Pt denied any further needs at this time.

## 2023-04-30 NOTE — ED Notes (Signed)
RN in to pt's room to assess pt. RN attempted to communicate with pt with white board. Pt allowed RN to obtain temperature. Pt refused to read white board after getting temperature. Pt refusing to allow RN to put on BP cuff with multiple attempts to communicate with pt. Pt swatting and yelling at RN. Call light within reach.

## 2023-04-30 NOTE — ED Notes (Signed)
Nurse Martin [name] attempted to set up patients dinner tray. Patient put covers over his head and he asked staff to turn his lights off.

## 2023-05-01 LAB — URINALYSIS, ROUTINE W REFLEX MICROSCOPIC
Bilirubin Urine: NEGATIVE
Glucose, UA: NEGATIVE mg/dL
Hgb urine dipstick: NEGATIVE
Ketones, ur: NEGATIVE mg/dL
Leukocytes,Ua: NEGATIVE
Nitrite: NEGATIVE
Protein, ur: NEGATIVE mg/dL
Specific Gravity, Urine: 1.013 (ref 1.005–1.030)
pH: 7 (ref 5.0–8.0)

## 2023-05-01 MED ORDER — ACETAMINOPHEN 325 MG PO TABS
650.0000 mg | ORAL_TABLET | Freq: Once | ORAL | Status: AC
  Administered 2023-05-01: 650 mg via ORAL
  Filled 2023-05-01: qty 2

## 2023-05-01 NOTE — ED Provider Notes (Signed)
Emergency Medicine Observation Re-evaluation Note  Martin [name] is a 67 y.o. male, seen on rounds today.  Pt initially presented to the ED for complaints of Placement/Boarder Currently, the patient is resting comfortably.  Physical Exam  BP 92/70   Pulse 64   Temp 98.3 F (36.8 C) (Oral)   Resp 16   SpO2 98%  Physical Exam Patient appears well, no acute distress, normal WOB    ED Course / MDM  EKG:   I have reviewed the labs performed to date as well as medications administered while in observation.  Recent changes in the last 24 hours include none.  Plan  Current plan is for dispo per social work.    Chesley Noon, MD 05/01/23 [phone]

## 2023-05-01 NOTE — ED Notes (Signed)
Interpreter arrived.

## 2023-05-01 NOTE — TOC Progression Note (Signed)
Transition of Care [facility]) - Progression Note    Patient Details  Name: Martin [name] MRN: [id number] Date of Birth: 1956-06-06  [facility]) CM/SW Contact  Darleene Cleaver, [place] Phone Number: 05/01/2023, 3:44 PM  Clinical Narrative:     CSW received message from [place], none of the [facility] facilities can accept patient.  CSW left message for [place] from [facility] to see if she can review patient for placement.  CSW awaiting for a call back.  Expected Discharge Plan:  (TBD) Barriers to Discharge: Continued Medical Work up  Expected Discharge Plan and Services                                               Social Determinants of Health (SDOH) Interventions SDOH Screenings   Tobacco Use: Low Risk  (04/29/2023)    Readmission Risk Interventions     No data to display

## 2023-05-01 NOTE — ED Notes (Signed)
Interpreter at bedside at this time.
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
## 2023-05-02 NOTE — Progress Notes (Signed)
Received call from [phone] reports patient family and point of contact Charlene as pancreatic cancer and would like all calls/updates and decisions to be going through patient's brother instead of her. Brother Linna Darner can be reached at [phone] .   TOC supervisor Minerva Areola who is covering this patient has been updated.   Darolyn Rua, [place], MSW, [place] [phone]

## 2023-05-02 NOTE — NC FL2 (Signed)
MEDICAID FL2 LEVEL OF CARE FORM     IDENTIFICATION  Patient Name: Martin [name] Birthdate: 28-Dec-1955 Sex: male Admission Date (Current Location): 03/30/2023  [place] and [place] Number:  [name] [id number] R Facility and Address:  [facility], [address] [place], [place] [address]      Provider Number: [id number]  Attending Physician Name and Address:  No att. providers found  Relative Name and Phone Number:  Elissa Hefty [phone]  [phone]  Ola[place]wa, Grieder Spouse [phone]  [phone]    Current Level of Care: Hospital Recommended Level of Care: Skilled Nursing Facility Prior Approval Number:    Date Approved/Denied:   PASRR Number: [id number]  Discharge Plan: SNF    Current Diagnoses: Patient Active Problem List   Diagnosis Date Noted   Abdominal pain 07/15/2020   Abdominal distension 06/04/2020   Ogilvie's syndrome    Leukopenia    Thrombocytopenia (HCC)    CKD (chronic kidney disease)    Distended abdomen    Gout    Adjustment disorder with mixed anxiety and depressed mood 03/22/2017   Lethargy 03/21/2017   Acute encephalopathy    FTT (failure to thrive) in adult    Protein-calorie malnutrition, severe 12/23/2015   Septic joint (HCC) 12/21/2015   Chronic pain syndrome 12/21/2015   Legally blind 12/21/2015   Deaf 12/21/2015   Hypothyroidism 12/21/2015   Swelling of left hand 12/21/2015   Chronic constipation     Orientation RESPIRATION BLADDER Height & Weight     Self, Time, Situation, Place  Normal Continent Weight:   Height:     BEHAVIORAL SYMPTOMS/MOOD NEUROLOGICAL BOWEL NUTRITION STATUS      Continent Diet  AMBULATORY STATUS COMMUNICATION OF NEEDS Skin   Supervision Verbally Normal                       Personal Care Assistance Level of Assistance  Bathing, Feeding, Dressing Bathing Assistance: Limited assistance Feeding assistance: Independent Dressing Assistance: Limited  assistance     Functional Limitations Info  Sight, Hearing, Speech Sight Info: Impaired Hearing Info: Impaired Speech Info: Adequate    SPECIAL CARE FACTORS FREQUENCY                       Contractures Contractures Info: Not present    Additional Factors Info  Code Status, Allergies Code Status Info: Full code Allergies Info: Ivp Dye (Iodinated Contrast Media)           Current Medications (05/02/2023):  This is the current hospital active medication list Current Facility-Administered Medications  Medication Dose Route Frequency Provider Last Rate Last Admin   acetaminophen (TYLENOL) tablet 650 mg  650 mg Oral Q6H PRN Pilar Jarvis, MD   650 mg at 04/30/23 1451   alum & mag hydroxide-simeth (MAALOX/MYLANTA) 200-200-20 MG/5ML suspension 30 mL  30 mL Oral Q6H PRN Sharman Cheek, MD       diclofenac Sodium (VOLTAREN) 1 % topical gel 2 g  2 g Topical Q6H PRN Pilar Jarvis, MD       dorzolamide (TRUSOPT) 2 % ophthalmic solution 1 drop  1 drop Both Eyes BID Sharman Cheek, MD   1 drop at 05/02/23 0934   hydrOXYzine (ATARAX) tablet 25 mg  25 mg Oral TID PRN Thurston Hole H, NP   25 mg at 04/15/23 0327   ibuprofen (ADVIL) tablet 600 mg  600 mg Oral Q8H PRN Sharman Cheek, MD       latanoprost (XALATAN) 0.005 %  ophthalmic solution 1 drop  1 drop Both Eyes QHS Pilar Jarvis, MD   1 drop at 04/30/23 2105   OLANZapine zydis (ZYPREXA) disintegrating tablet 5 mg  5 mg Oral BID PRN Bennett, Christal H, NP       Or   OLANZapine (ZYPREXA) injection 5 mg  5 mg Intramuscular BID PRN Bennett, Christal H, NP       ondansetron (ZOFRAN) tablet 4 mg  4 mg Oral Q8H PRN Sharman Cheek, MD       polyethylene glycol (MIRALAX / GLYCOLAX) packet 17 g  17 g Oral Daily Delton Prairie, MD   17 g at 05/02/23 8295   polyvinyl alcohol (LIQUIFILM TEARS) 1.4 % ophthalmic solution 2 drop  2 drop Both Eyes PRN Merwyn Katos, MD   2 drop at 04/25/23 1617   sodium chloride 0.9 % bolus 1,000 mL  1,000  mL Intravenous Once Shaune Pollack, MD       traZODone (DESYREL) tablet 50 mg  50 mg Oral QHS PRN Bennett, Christal H, NP   50 mg at 04/19/23 0028   trolamine salicylate (ASPERCREME) 10 % cream   Topical BID PRN Merwyn Katos, MD   Given at 05/01/23 [phone]   Current Outpatient Medications  Medication Sig Dispense Refill   acetaminophen (TYLENOL) 325 MG tablet Take 650 mg by mouth every 8 (eight) hours as needed.     bimatoprost (LUMIGAN) 0.01 % SOLN Place 1 drop into both eyes at bedtime. (Patient taking differently: Place 1 drop into both eyes in the morning, at noon, and at bedtime. 0900/1300/2100) 7.5 mL 2   clonazePAM (KLONOPIN) 0.5 MG tablet Take 0.5 mg by mouth 2 (two) times daily as needed for anxiety.     diclofenac Sodium (VOLTAREN) 1 % GEL Apply 2 g topically every 6 (six) hours as needed. Apply to right shoulder     dorzolamide-timolol (COSOPT) 22.3-6.8 MG/ML ophthalmic solution Place 1 drop into both eyes 2 (two) times daily. 10 mL 2   levothyroxine (SYNTHROID) 125 MCG tablet Take 1 tablet (125 mcg total) by mouth daily before breakfast. 30 tablet 2   linaclotide (LINZESS) 290 MCG CAPS capsule Take 1 capsule (290 mcg total) by mouth daily before breakfast. 30 capsule 0   Menthol, Topical Analgesic, (BIOFREEZE) 4 % GEL Apply 1 Application topically in the morning and at bedtime.     Netarsudil Dimesylate (RHOPRESSA) 0.02 % SOLN Place 1 drop into both eyes at bedtime.     polyethylene glycol powder (GLYCOLAX/MIRALAX) 17 GM/SCOOP powder Take 17 g by mouth 2 (two) times daily. 255 g 0   traMADol (ULTRAM) 50 MG tablet Take by mouth every 12 (twelve) hours as needed.     diclofenac (VOLTAREN) 75 MG EC tablet Take 75 mg by mouth. (Patient not taking: Reported on 03/30/2023)     GABAPENTIN PO Take by mouth. (Patient not taking: Reported on 03/30/2023)       Discharge Medications: Please see discharge summary for a list of discharge medications.  Relevant Imaging Results:  Relevant Lab  Results:   Additional Information SSN [id number]  Darleene Cleaver, LCSW

## 2023-05-02 NOTE — TOC Progression Note (Addendum)
Transition of Care [facility]) - Progression Note    Patient Details  Name: Martin [name] MRN: [id number] Date of Birth: 1956-09-07  Transition of Care [facility]) CM/SW Contact  Darleene Cleaver, [place] Phone Number: 05/02/2023, 12:41 PM  Clinical Narrative:     CSW spoke to DTP team to discuss patient.  [facility] requested updated referral be made for them to review.  CSW also spoke to [place] from [place] to see if they can review patient's information.  CSW sent updated clinicals to SNFs waiting for them to review again.  CSW spoke to [place] at [place], she will review patient's information as well.    Email sent to [employer], [email or web address] at [facility], an advocate for communication access/support for deaf/blind to update her on patient's situation and to see if she had any other suggestions of SNFs or services that can accept patient.  CSW to continue to follow patient's progress throughout discharge planning.   Expected Discharge Plan:  (TBD) Barriers to Discharge: Continued Medical Work up  Expected Discharge Plan and Services                                               Social Determinants of Health (SDOH) Interventions SDOH Screenings   Tobacco Use: Low Risk  (04/29/2023)    Readmission Risk Interventions     No data to display

## 2023-05-02 NOTE — ED Notes (Signed)
Pt repositioned in bed.

## 2023-05-02 NOTE — ED Notes (Signed)
Pt got his tuna, and refused to eat anything at this time

## 2023-05-02 NOTE — ED Notes (Signed)
Pt sleeping at this time.

## 2023-05-02 NOTE — ED Notes (Signed)
Pt requested tuna this RN called dietary and requested tuna, pt did not receive it. This RN is calling again for tuna, pt aware. Pt pushing side table away

## 2023-05-02 NOTE — ED Notes (Signed)
Interpreter at bedside.

## 2023-05-03 NOTE — ED Provider Notes (Signed)
-----------------------------------------   5:20 PM on 05/03/2023 -----------------------------------------   Blood pressure 108/69, pulse 66, temperature 98.5 F (36.9 C), temperature source Oral, resp. rate 18, SpO2 98%.  The patient is calm and cooperative at this time.  There have been no acute events since the last update.    Patient eating well today.  Just use the urinal.  No complaints at this time.  Awaiting disposition plan from case management/social work.    Corena Herter, MD 05/03/23 [phone]

## 2023-05-03 NOTE — ED Notes (Signed)
Pt in NAD, ate all of his breakfast and has yet to eat his lunch or dinner. Pt calls out when in need, used the urinal independently, colostomy bag has output but does not need to be changed or emptied at this time, does reposition in bed independently. Pt refused VS for this shift.

## 2023-05-04 LAB — CBC
HCT: 29.6 % — ABNORMAL LOW (ref 39.0–52.0)
Hemoglobin: 9.6 g/dL — ABNORMAL LOW (ref 13.0–17.0)
MCH: 31.2 pg (ref 26.0–34.0)
MCHC: 32.4 g/dL (ref 30.0–36.0)
MCV: 96.1 fL (ref 80.0–100.0)
Platelets: 131 10*3/uL — ABNORMAL LOW (ref 150–400)
RBC: 3.08 MIL/uL — ABNORMAL LOW (ref 4.22–5.81)
RDW: 16.4 % — ABNORMAL HIGH (ref 11.5–15.5)
WBC: 4.4 10*3/uL (ref 4.0–10.5)
nRBC: 0 % (ref 0.0–0.2)

## 2023-05-04 LAB — BASIC METABOLIC PANEL
Anion gap: 7 (ref 5–15)
BUN: 25 mg/dL — ABNORMAL HIGH (ref 8–23)
CO2: 27 mmol/L (ref 22–32)
Calcium: 9 mg/dL (ref 8.9–10.3)
Chloride: 104 mmol/L (ref 98–111)
Creatinine, Ser: 1.07 mg/dL (ref 0.61–1.24)
GFR, Estimated: >60 mL/min (ref >60)
Glucose, Bld: 95 mg/dL (ref 70–99)
Potassium: 4.1 mmol/L (ref 3.5–5.1)
Sodium: 138 mmol/L (ref 135–145)

## 2023-05-04 MED ORDER — SODIUM CHLORIDE 0.9 % IV BOLUS
1000.0000 mL | Freq: Once | INTRAVENOUS | Status: AC
  Administered 2023-05-04: 1000 mL via INTRAVENOUS

## 2023-05-04 NOTE — Progress Notes (Signed)
PT Cancellation Note  Patient Details Name: Martin [name] MRN: [id number] DOB: 30-Jul-1956   Cancelled Treatment:     Pt resting comfortably, will continue skilled PT tomorrow.    Jannet Askew 05/04/2023, 3:34 PM

## 2023-05-04 NOTE — ED Notes (Signed)
This nurse woke pt up to empty colostomy bag/ pt pushed this nurse hand away and pulled covers over self/ this nurse will attempt to try again later

## 2023-05-04 NOTE — ED Notes (Signed)
Pt refuses vitals  

## 2023-05-04 NOTE — ED Provider Notes (Signed)
-----------------------------------------   5:44 AM on 05/04/2023 -----------------------------------------   Blood pressure (!) 93/59, pulse 64, temperature 98.3 F (36.8 C), temperature source Axillary, resp. rate 16, SpO2 96%.  The patient is calm and cooperative at this time.  There have been no acute events since the last update.  Awaiting disposition plan from Social Work team.   Irean Hong, MD 05/04/23 [phone]

## 2023-05-04 NOTE — ED Notes (Signed)
Colostomy bag emptied/ pericare performed/ brief changed/ barrier cream applied to bottom/ chuck pad changed, new blankets given to pt/ heels floated/ pt requesting a tuna sandwich and this nurse explained that it is 5am and the cafeteria is closed at this time and that breakfast will be served at 7:30/ pt has no further requests or complaints at this time

## 2023-05-04 NOTE — ED Notes (Signed)
Pt A&Ox4. Vitals stable. Pt colostomy cleaned at this time with this RN and NT. Pt set up to feed self eating sausage biscuit also at this time.

## 2023-05-05 MED ORDER — ENSURE ENLIVE PO LIQD
237.0000 mL | Freq: Two times a day (BID) | ORAL | Status: DC
  Administered 2023-05-05 – 2023-12-03 (×143): 237 mL via ORAL

## 2023-05-05 NOTE — ED Notes (Signed)
Pt refused vitals and for this nurse to empty out colostomy bag

## 2023-05-05 NOTE — ED Notes (Signed)
In to pt's room to give morning meds, pt compliant with medications. Pt ate breakfast.

## 2023-05-05 NOTE — ED Notes (Signed)
RN called [facility] to request in person ASL interpreter. RN spoke to [employer] who said that they would try to send an in person interpreter and would call back.

## 2023-05-05 NOTE — Progress Notes (Signed)
PT Cancellation Note  Patient Details Name: Martin [name] MRN: [id number] DOB: Feb 26, 1956   Cancelled Treatment:    Reason Eval/Treat Not Completed: Patient declined, no reason specified Attempted to see pt for PT tx, no interpreter present but pt able to communicate using dry erase board. Pt declined PT today. Will f/u as able.  Aleda Grana, PT, DPT 05/05/23, 1:55 PM   Sandi Mariscal 05/05/2023, 1:55 PM

## 2023-05-05 NOTE — ED Notes (Signed)
ASL interpreter in person at bedside. Patient eating lunch at this time.   Breakfast tray at bedside and ensure- vanilla patient requested

## 2023-05-05 NOTE — TOC Progression Note (Signed)
Transition of Care [facility]) - Progression Note    Patient Details  Name: Martin [name] MRN: [id number] Date of Birth: 1956/06/14  [facility]) CM/SW Contact  Darleene Cleaver, [place] Phone Number: 05/04/2023, 12:35 PM  Clinical Narrative:     Halford Chessman admissions worker [place], she will review patient's information.     Expected Discharge Plan:  (TBD) Barriers to Discharge: Continued Medical Work up  Expected Discharge Plan and Services                                               Social Determinants of Health (SDOH) Interventions SDOH Screenings   Tobacco Use: Low Risk  (04/29/2023)    Readmission Risk Interventions     No data to display

## 2023-05-05 NOTE — ED Notes (Signed)
Nutrition services called to place breakfast order.

## 2023-05-05 NOTE — TOC Progression Note (Signed)
Transition of Care [facility]) - Progression Note    Patient Details  Name: Martin [name] MRN: [id number] Date of Birth: 1956-04-06  Transition of Care [facility]) CM/SW Contact  Darleene Cleaver, [place] Phone Number: 05/05/2023, 5:59 PM  Clinical Narrative:    CSW received email back from [facility], [phone], she said there are not any skilled nursing facilities that serve Deaf, Hard of Hearing or DeafBlind patients or staff who knows American Sign Language (ASL) that can communicate with them.  Per Joni Reining, she said the agency does provide four free basic ASL classes when they have Deaf, Hard of Hearing and DeafBlind patients who use ASL.  Once you find LE a placement, notify her and she will have her team schedule ASL classes to the staff of the facility.  She stated that is what they did when he was placed in [place].  [facility], [facility], and [place] reviewing patient.    Expected Discharge Plan:  (TBD) Barriers to Discharge: Continued Medical Work up  Expected Discharge Plan and Services                                               Social Determinants of Health (SDOH) Interventions SDOH Screenings   Tobacco Use: Low Risk  (04/29/2023)    Readmission Risk Interventions     No data to display

## 2023-05-05 NOTE — ED Notes (Signed)
When this narrator went into pt room to introduce self, the pt was laying in bed with his eyes closed. Respirations were even and unlabored.

## 2023-05-06 NOTE — ED Notes (Signed)
This RN communicating with patient via white board as in-person ASL interpreter is not available at this time. Patient requesting his clippers to shave his face and head. He writes that his clippers are with his belongings that are being kept at the nurse's station, however, there are no belongings for patient at the nurse's station and patient's belongings are in his room. Patient allowed me to look in belongings bag on chair in room but I did not see his clippers. He would not allow this RN to look in the 2 bags that he has on his bed. Patient would also not allow this RN to obtain vitals.

## 2023-05-06 NOTE — ED Notes (Signed)
Breakfast tray ordered per patient request

## 2023-05-06 NOTE — ED Provider Notes (Signed)
-----------------------------------------   5:27 AM on 05/06/2023 -----------------------------------------   Blood pressure (!) 96/52, pulse 82, temperature 98.2 F (36.8 C), temperature source Oral, resp. rate 18, SpO2 96%.  The patient is calm and cooperative at this time.  There have been no acute events since the last update.  Awaiting disposition plan from Social Work team.   Irean Hong, MD 05/06/23 [phone]

## 2023-05-07 LAB — URINALYSIS, COMPLETE (UACMP) WITH MICROSCOPIC
Bacteria, UA: NONE SEEN
Bilirubin Urine: NEGATIVE
Glucose, UA: NEGATIVE mg/dL
Hgb urine dipstick: NEGATIVE
Ketones, ur: NEGATIVE mg/dL
Leukocytes,Ua: NEGATIVE
Nitrite: NEGATIVE
Protein, ur: NEGATIVE mg/dL
Specific Gravity, Urine: 1.023 (ref 1.005–1.030)
Squamous Epithelial / HPF: NONE SEEN /HPF (ref 0–5)
pH: 5 (ref 5.0–8.0)

## 2023-05-07 NOTE — ED Notes (Signed)
The pt was assisted with a bed bath, brushing his teeth, and changing his bed linen. The pt requested his electrical razor but the interpreter was unable to locate the razor in his belongings. Security was contacted and they advised they were unable to locate the razor. The pt was notified of the same.

## 2023-05-07 NOTE — ED Notes (Signed)
Patient is awake and refusing eye drops due last night. Patient writes on white board to wait two hours and bring drops and food together.

## 2023-05-07 NOTE — ED Notes (Signed)
Emptied patient's ostomy bag

## 2023-05-07 NOTE — ED Provider Notes (Signed)
Emergency Medicine Observation Re-evaluation Note  Martin [name] is a 67 y.o. male, currently boarding in the emergency department.  No acute events since last update.  Physical Exam  BP (!) 96/52   Pulse 82   Temp 98.2 F (36.8 C) (Oral)   Resp 19   SpO2 96%   ED Course / MDM   Patient's lab work performed 3 days ago shows a reassuring CBC, reassuring chemistry and urinalysis from 6 days ago is normal.  Nurse does report a strong urine smell we will recheck a urinalysis today.  Plan  Current plan is for recheck of urinalysis.  Continue to look for placement for the patient.  Social worker is working with the patient to achieve this.    Minna Antis, MD 05/07/23 1044

## 2023-05-08 NOTE — ED Notes (Signed)
Resumed care from [facility].  Pt sleeping

## 2023-05-08 NOTE — TOC Progression Note (Addendum)
Transition of Care [facility]) - Progression Note    Patient Details  Name: Martin [name] MRN: [id number] Date of Birth: 10/29/1955  [facility]) CM/SW Contact  Kreg Shropshire, RN Phone Number: 05/08/2023, 1:43 PM  Clinical Narrative:     Cm reached out to [facility] admissions and spoke with Lovie Macadamia. They are not able to accept Long Term bed at this time. Cm asked if pt could be put on a waiting list. She stated pt has to be on [place] [facility]. Cm reached out to [employer] [facility] [place] [phone]. She requested updated clinical. Cm sent updated clinical. CM LVM for admissions of [facility] SN[facility]. Cm awaiting to hear back.   [id number] for [facility] admissions at [facility]. Awaiting to hear back.  Expected Discharge Plan:  (TBD) Barriers to Discharge: Continued Medical Work up  Expected Discharge Plan and Services                                               Social Determinants of Health (SDOH) Interventions SDOH Screenings   Tobacco Use: Low Risk  (04/29/2023)    Readmission Risk Interventions     No data to display

## 2023-05-08 NOTE — ED Provider Notes (Signed)
Emergency Medicine Observation Re-evaluation Note  Martin [name] is a 67 y.o. male, seen on rounds today.  Pt initially presented to the ED for complaints of Placement/Boarder  Currently, the patient is calm, no acute complaints.  Physical Exam  Blood pressure (!) 96/52, pulse 82, temperature 98.1 F (36.7 C), temperature source Axillary, resp. rate 19, SpO2 96%. Physical Exam General: NAD Lungs: CTAB Psych: not agitated  ED Course / MDM  EKG:    I have reviewed the labs performed to date as well as medications administered while in observation.  Recent changes in the last 24 hours include no acute events overnight.    Plan  Current plan is for TOC placement.   Sharman Cheek, MD 05/08/23 0730
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
## 2023-05-08 NOTE — ED Notes (Signed)
Breakfast tray ordered per pt request.

## 2023-05-09 NOTE — ED Notes (Signed)
Dietary order placed for pt.

## 2023-05-09 NOTE — TOC Progression Note (Addendum)
Transition of Care [facility]) - Progression Note    Patient Details  Name: Juluis Stratmann MRN: [id number] Date of Birth: Jun 15, 1956  [facility]) CM/SW Contact  Kreg Shropshire, RN Phone Number: 05/09/2023, 11:55 AM  Clinical Narrative:     Cm was informed during DTP weekly meeting regarding from supervisor Gretta Cool that [facility] SNF was working on trying to find placement for pt at another facility. Cm reached out to [employer] admissions coordinator at [facility]. Revonda Standard stated that her supervisor sent a referral to SNFs in the [place] area. No updates yet. Pt is from [place] area. Her supervisor will be back on Thursday and she will ask for updates. Cm will continue to follow for updates.  1406- Cm spoke with Jettie Booze [phone] who is over [place], [place], and [place]. She stated she cannot accept pt due to previous behavior issues at previous facility.  Expected Discharge Plan:  (TBD) Barriers to Discharge: Continued Medical Work up  Expected Discharge Plan and Services                                               Social Determinants of Health (SDOH) Interventions SDOH Screenings   Tobacco Use: Low Risk  (04/29/2023)    Readmission Risk Interventions     No data to display

## 2023-05-09 NOTE — ED Notes (Signed)
Patient set up with dinner tray. Eating scrambled egg sandwich at this time. Patient given water per request. Patient asked light to be cut out. Call bell in place

## 2023-05-09 NOTE — ED Notes (Signed)
Pt woke up, voided 300cc urine.

## 2023-05-09 NOTE — ED Provider Notes (Signed)
Emergency Medicine Observation Re-evaluation Note  Martin [name] is a 67 y.o. male, currently boarding in the emergency department.  No acute events since last update.  Physical Exam  BP (!) 110/59 (BP Location: Right Arm)   Pulse 72   Temp 98.2 F (36.8 C) (Oral)   Resp 14   SpO2 99%   ED Course / MDM   Patient had a urinalysis performed several days ago showing no acute finding.  Lab work performed 5 days ago shows a reassuring CBC reassuring chemistry. Plan  Current plan is for placement to an appropriate living facility once available.  Social worker is currently working with the patient to achieve this.    Minna Antis, MD 05/09/23 2311

## 2023-05-09 NOTE — ED Notes (Signed)
Report off to gracie  rn  

## 2023-05-10 NOTE — ED Provider Notes (Signed)
-----------------------------------------   6:39 AM on 05/10/2023 -----------------------------------------   Blood pressure (!) 110/59, pulse 72, temperature 98.2 F (36.8 C), temperature source Oral, resp. rate 14, SpO2 99%.  The patient is calm and cooperative at this time.  There have been no acute events since the last update.  Awaiting disposition plan from Social Work team.   Irean Hong, MD 05/10/23 [phone]

## 2023-05-10 NOTE — ED Notes (Signed)
Pt's brief and pad changed.
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
## 2023-05-10 NOTE — TOC Progression Note (Addendum)
Transition of Care [facility]) - Progression Note    Patient Details  Name: Martin [name] MRN: [id number] Date of Birth: Jan 19, 1956  [facility]) CM/SW Contact  Darleene Cleaver, [place] Phone Number: 05/10/2023, 3:36 PM  Clinical Narrative:     Updated clinicals sent to SNFs and informed them that per Lenda Kelp, DeafBlind Services Specialist from [facility], they will provide 4 free basic ASL Classes for whichever SNF can accept him.  Tammy from [facility] still reviewing patient.  CSW to continue to follow patient's progress throughout discharge planning.   Expected Discharge Plan:  (TBD) Barriers to Discharge: Continued Medical Work up  Expected Discharge Plan and Services                                               Social Determinants of Health (SDOH) Interventions SDOH Screenings   Tobacco Use: Low Risk  (04/29/2023)    Readmission Risk Interventions     No data to display

## 2023-05-11 NOTE — ED Provider Notes (Signed)
-----------------------------------------   6:23 AM on 05/11/2023 -----------------------------------------   Blood pressure 96/62, pulse 68, temperature 98.1 F (36.7 C), temperature source Oral, resp. rate 14, SpO2 94%.  The patient is calm and cooperative at this time.  There have been no acute events since the last update.  Awaiting disposition plan from Social Work team.   Irean Hong, MD 05/11/23 [phone]

## 2023-05-11 NOTE — ED Notes (Signed)
ASL interpreter in room

## 2023-05-11 NOTE — ED Notes (Signed)
Pt is asleep, RR and WOB WNL, NAD. No needs identified. Call light within reach, side rails up x2, bed locked and in the lowest position.

## 2023-05-11 NOTE — ED Notes (Signed)
Dinner order placed with dietary

## 2023-05-11 NOTE — Progress Notes (Signed)
PT Cancellation Note  Patient Details Name: Martin [name] MRN: [id number] DOB: 1956-06-16   Cancelled Treatment:    Reason Eval/Treat Not Completed: Fatigue/lethargy limiting ability to participate Environmental education officer arrives to room after ASL interpreter arrives. Pt declined PT treatment this date, asks to prioritize rest, asks to plan on PT session next day.) Author offered some alternative interventions not specific to walking/transfers, both of which are quite painful for him. He politely declines all offers. Will plan on seeing next day.   2:17 PM, 05/11/23 Martin [name], PT, DPT Physical Therapist - [facility]  [phone] (ASCOM)    Martin [name] 05/11/2023, 2:16 PM

## 2023-05-11 NOTE — ED Notes (Signed)
Pt awake, able to use urinal without assistance. No needs expressed by pt at this time.

## 2023-05-12 NOTE — ED Notes (Signed)
Patient lying on stretcher at this time with eyes closed. Chest rise and fall noted. Skin pink in color and normal for ethnicity. Ambulatory. Resp even and unlabored. Nad noted

## 2023-05-12 NOTE — ED Notes (Signed)
Breakfast and lunch ordered for pt. Pt denies further needs at this time, WCTM.
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
## 2023-05-12 NOTE — ED Notes (Signed)
Bedding changed, pt positioned for comfort. Dinner ordered for pt, no further needs at this time.

## 2023-05-12 NOTE — TOC Progression Note (Addendum)
Transition of Care [facility]) - Progression Note    Patient Details  Name: Martin [name] MRN: [id number] Date of Birth: 02-13-1956  [facility]) CM/SW Contact  Darleene Cleaver, [place] Phone Number: 05/12/2023, 3:56 PM  Clinical Narrative:     Still no SNF placement options for patient.  Six facilities reviewing patient's information.  CSW spoke to [place] at [facility], reference number [phone] to request a care coordinator or care manager from [facility] to assist with placement.  [place] will have a care coordinator or care manager contact CSW once he has one assigned.    Expected Discharge Plan:  (TBD) Barriers to Discharge: Continued Medical Work up  Expected Discharge Plan and Services                                               Social Determinants of Health (SDOH) Interventions SDOH Screenings   Tobacco Use: Low Risk  (04/29/2023)    Readmission Risk Interventions     No data to display

## 2023-05-12 NOTE — ED Notes (Signed)
Pt gave tech urinal after voiding, this tech emptied it.  This tech obtained vitals on pt.  This pt gave tech white board with his lunch order of 2 chicken tenders and fries small. This tech will pass the message along to pt nurse.

## 2023-05-12 NOTE — ED Notes (Signed)
Patient lying on stretcher at this time with eyes closed. Chest rise and fall noted. Skin pwd. Ambulatory. Resp even and unlabored.

## 2023-05-12 NOTE — ED Notes (Signed)
RN ask to obtain vitals, advised pt is currently sleeping will pass on to the next tech that is assigned to rooms at 3am.

## 2023-05-12 NOTE — ED Provider Notes (Signed)
Emergency Medicine Observation Re-evaluation Note  Martin [name] is a 67 y.o. male, seen on rounds today.  Pt initially presented to the ED for complaints of Placement/Boarder Currently, the patient is awaiting placement.  Physical Exam  BP 101/66 (BP Location: Left Arm)   Pulse 68   Temp 98.3 F (36.8 C) (Oral)   Resp 20   SpO2 94%  Physical Exam General: resting comfortably   ED Course / MDM   No new labs in past 24 hours  Plan  Current plan is for placement.    Phineas Semen, MD 05/12/23 [phone]

## 2023-05-13 NOTE — ED Notes (Signed)
Patient lying on stretcher at this time with eyes closed. Chest rise and fall noted. Skin pink in color and normal for ethnicity. Ambulatory. Resp even and unlabored. Nad noted at this time. Call light at bedside.

## 2023-05-13 NOTE — ED Notes (Signed)
Patient lying on stretcher at this time with eyes closed. Chest rise and fall noted. Skin pink in color and normal for ethnicity. Ambulatory. Resp even and unlabored. Nad noted.  Call Light within reach.

## 2023-05-13 NOTE — ED Notes (Signed)
Patient lying on stretcher at this time with eyes closed. Chest rise and fall noted. Skin pink in color and normal for ethnicity. Ambulatory. Resp even and unlabored. Nad noted

## 2023-05-13 NOTE — ED Provider Notes (Signed)
-----------------------------------------   7:07 AM on 05/13/2023 -----------------------------------------   Blood pressure 100/70, pulse 68, temperature 98.3 F (36.8 C), temperature source Oral, resp. rate 18, SpO2 95%.  The patient is calm and cooperative at this time.  There have been no acute events since the last update.  Awaiting disposition plan from [facility] team.   Loleta Rose, MD 05/13/23 [phone]

## 2023-05-13 NOTE — ED Notes (Signed)
Patient resting comfortably at this time. Respirations even and unlabored.

## 2023-05-14 NOTE — ED Provider Notes (Signed)
-----------------------------------------   6:14 AM on 05/14/2023 -----------------------------------------   Blood pressure 100/64, pulse 68, temperature 98.4 F (36.9 C), temperature source Oral, resp. rate 16, SpO2 99%.  The patient is calm and cooperative at this time.  There have been no acute events since the last update.  Awaiting disposition plan from Social Work team.   Irean Hong, MD 05/14/23 [phone]

## 2023-05-14 NOTE — ED Notes (Signed)
Patient resting comfortably at this time. Requesting cheeseburger. Ordered at this time.

## 2023-05-14 NOTE — ED Notes (Signed)
Pt provided a bed bath with peri care.

## 2023-05-15 NOTE — ED Notes (Signed)
PT arrived, relayed message to coordinate with Sign Interpreter for tomorrow session.

## 2023-05-15 NOTE — ED Provider Notes (Signed)
-----------------------------------------   10:48 AM on 05/15/2023 -----------------------------------------   Blood pressure 98/62, pulse 64, temperature 98 F (36.7 C), temperature source Oral, resp. rate 16, SpO2 97%.  The patient is calm and cooperative at this time.  There have been no acute events since the last update.  Awaiting disposition plan from case management/social work.    Corena Herter, MD 05/15/23 1048

## 2023-05-15 NOTE — ED Notes (Signed)
PT contacted, no answer, message left.

## 2023-05-15 NOTE — ED Notes (Signed)
Breakfast ordered for pt and urinal emptied. Pt denies further needs, WCTM.

## 2023-05-15 NOTE — ED Notes (Signed)
Lunch and dinner ordered for pt, interpreter to bedside.

## 2023-05-15 NOTE — TOC Progression Note (Signed)
Transition of Care [facility]) - Progression Note    Patient Details  Name: Martin [name] MRN: [id number] Date of Birth: Mar 23, 1956  Transition of Care [facility]) CM/SW Contact  Darleene Cleaver, [place] Phone Number: 05/15/2023, 5:08 PM  Clinical Narrative:     CSW spoke to Sarina Ill from [employer] and asked her to review patient for LTC snf placement.  She requested to have the clinical information faxed to her at [phone].  CSW faxed requested clinicals at 5:09pm.  CSW to continue to follow patient's progress throughout discharge planning.   Expected Discharge Plan:  (TBD) Barriers to Discharge: Continued Medical Work up  Expected Discharge Plan and Services                                               Social Determinants of Health (SDOH) Interventions SDOH Screenings   Tobacco Use: Low Risk  (04/29/2023)    Readmission Risk Interventions     No data to display

## 2023-05-15 NOTE — ED Notes (Signed)
This RN unable to understand current needs of patient, interpreter assistance requested.

## 2023-05-16 NOTE — ED Provider Notes (Signed)
Emergency Medicine Observation Re-evaluation Note  Martin [name] is a 67 y.o. male, seen on rounds today.  Pt initially presented to the ED for complaints of Placement/Boarder  Currently, the patient is resting comfortably.  Physical Exam  BP 98/62   Pulse 64   Temp 98.1 F (36.7 C) (Axillary)   Resp 16   SpO2 97%  General: No acute distress Cardiac: Well-perfused extremities Lungs: No respiratory distress Psych: Appropriate mood and affect  ED Course / MDM  EKG:   I have reviewed the labs performed to date as well as medications administered while in observation.  Recent changes in the last 24 hours include none.  Plan  Current plan is for placement.   Merwyn Katos, MD 05/16/23 [phone]

## 2023-05-16 NOTE — ED Notes (Signed)
Pt sleeping. 

## 2023-05-16 NOTE — ED Notes (Signed)
Pt was given a small bed bath and new brief was placed. Pt c/o colostomy bag coming up on one end, new bag placed

## 2023-05-16 NOTE — ED Notes (Signed)
Pt refused his eye drops at this time and wanted to go back to sleep

## 2023-05-16 NOTE — ED Notes (Signed)
Breakfast and lunch ordered for pt, interpreter at bedside at this time

## 2023-05-16 NOTE — Progress Notes (Signed)
PT Cancellation Note  Patient Details Name: Martin [name] MRN: [id number] DOB: 1956/02/26   Cancelled Treatment:     Attempted PT yesterday, Interpreter left as this therapist was heading to ED. Contacted nursing this am, interpreter was leaving for the day at 10:00am. Will continue PT if able next available date/time.   Jannet Askew 05/16/2023, 3:57 PM

## 2023-05-17 NOTE — ED Provider Notes (Signed)
-----------------------------------------   6:14 AM on 05/17/2023 -----------------------------------------   Blood pressure 106/88, pulse 78, temperature 98.7 F (37.1 C), temperature source Oral, resp. rate 18, SpO2 98%.  The patient is calm and cooperative at this time.  There have been no acute events since the last update.  Awaiting disposition plan from Social Work team.   Irean Hong, MD 05/17/23 [phone]

## 2023-05-17 NOTE — ED Notes (Signed)
Patient care provided, Hair cut and face washed

## 2023-05-17 NOTE — ED Notes (Signed)
Pt care taken, no complaints at this time. 

## 2023-05-18 NOTE — Progress Notes (Addendum)
PT Cancellation Note  Patient Details Name: Martin [name] MRN: [id number] DOB: Sep 03, 1956   Cancelled Treatment:    Reason Eval/Treat Not Completed: Other (comment) Arrived in AM, when interp is often there, but interp had not arrived yet.  Attempted to coordinate for lunch time session and PT arrives to ED at discussed planned time with interp delayed.  Discussed with nursing/mobility team to see if they could at least facilitate some activity while interp is there.  Unable to conduct formal PT session this date despite concerted effort.  Malachi Pro, DPT 05/18/2023, 12:45 PM

## 2023-05-18 NOTE — ED Provider Notes (Signed)
Emergency Medicine Observation Re-evaluation Note  Physical Exam   BP 106/88 (BP Location: Right Arm)   Pulse 78   Temp 98.7 F (37.1 C) (Oral)   Resp 18   SpO2 98%   Patient appears in no acute distress.  ED Course / MDM   No reported events during my shift at the time of this note.   Pt is awaiting dispo from SW   Pilar Jarvis MD    Pilar Jarvis, MD 05/18/23 [phone]

## 2023-05-18 NOTE — TOC Progression Note (Signed)
Transition of Care [facility]) - Progression Note    Patient Details  Name: Cezar Hensler MRN: [id number] Date of Birth: 29-May-1956  Transition of Care [facility]) CM/SW Contact  Darleene Cleaver, [place] Phone Number: 05/18/2023, 6:11 PM  Clinical Narrative:     Attempted to contact Sarina Ill from [employer] had to leave a message.  Expected Discharge Plan:  (TBD) Barriers to Discharge: Continued Medical Work up  Expected Discharge Plan and Services    New SNF once bed is found.                                           Social Determinants of Health (SDOH) Interventions SDOH Screenings   Tobacco Use: Low Risk  (04/29/2023)    Readmission Risk Interventions     No data to display

## 2023-05-19 NOTE — ED Provider Notes (Signed)
-----------------------------------------   5:55 AM on 05/19/2023 -----------------------------------------   Blood pressure 104/69, pulse 65, temperature 97.9 F (36.6 C), temperature source Oral, resp. rate 16, SpO2 98%.  The patient is calm and cooperative at this time.  There have been no acute events since the last update.  Awaiting disposition plan from Social Work team.   Irean Hong, MD 05/19/23 [phone]

## 2023-05-19 NOTE — ED Notes (Signed)
Assumed care of pt at this time. Pt is asleep, RR and WOB WNL, NAD. No needs identified. Call light within reach, side rails up x2, bed locked and in the lowest position.

## 2023-05-19 NOTE — Progress Notes (Signed)
Physical therapy discharge note:  Patient Details Name: Martin [name] MRN: [id number] DOB: [birth year]   Cancelled Treatment:    Reason Eval/Treat Not Completed: Other (comment) (Pt refusing mobility earlier in day when interpreter was here. Pt consistently working with PT 1-2x/week, often refuses. Pt has been performing his baseline mobility with physical therapy for several weeks now, however no skilled services are needed to achieve this. Pt has not acute medical issue, no acute mobility impairment. Pt is not interested in improving or modifying his mobility at this time. Educational intervention attempts have been poorly received and unsuccessful. Pt is safe to mobilize with nursing staff and should be doing this daily. No additional skilled PT services are needed at this time. Will sign off at this time and defer mobility needs to nursing team.   12:22 PM, 05/19/23 Rosamaria Lints, PT, DPT Physical Therapist - [facility]  [phone] (ASCOM)     , C 05/19/2023, 12:21 PM

## 2023-05-19 NOTE — Progress Notes (Signed)
Mobility Specialist - Progress Note     05/19/23 0850  Mobility  Activity Refused mobility   Author attempted mobility session. Pt refused ambulation or any exercise and became agitated with author and interpreter. Purposely closed eyes to end further conversation. RN notified.   Martin [name] Mobility Specialist 05/19/23, 8:54 AM

## 2023-05-19 NOTE — ED Notes (Signed)
Pt lunch ordered per pt request.

## 2023-05-19 NOTE — ED Notes (Signed)
Pt urinal emptied, pt denied other needs at this time. Pt dinner at bedside, pt not eating at this time.

## 2023-05-19 NOTE — ED Notes (Signed)
This RN and interpreter got pt meal orders for the day, pt denied any needs. Pt used the urinal. Pt ate breakfast.

## 2023-05-19 NOTE — ED Notes (Signed)
Mobility came to see pt with interpreter here and pt refused to do any exercises or movement.

## 2023-05-19 NOTE — ED Notes (Signed)
Pt ostomy bag checked, minimal output in bag at this time. Pt lunch at bedside, RN told pt his lunch was there. Pt asked RN to turn off light and he was okay.

## 2023-05-19 NOTE — ED Notes (Signed)
Pt breakfast order placed. Interpreter here.

## 2023-05-19 NOTE — TOC Progression Note (Signed)
Transition of Care [facility]) - Progression Note    Patient Details  Name: Martin [name] MRN: [id number] Date of Birth: [birth year]  Transition of Care [facility]) CM/SW Contact  Darleene Cleaver, [place] Phone Number: 05/19/2023, 5:53 PM  Clinical Narrative:     Sent text message to [facility] from [facility] to see if she has reviewed clinicals yet, have not heard back from her.  CSW to follow up at a later time.   Expected Discharge Plan:  (TBD) Barriers to Discharge: Continued Medical Work up  Expected Discharge Plan and Services                                               Social Determinants of Health (SDOH) Interventions SDOH Screenings   Tobacco Use: Low Risk  (04/29/2023)    Readmission Risk Interventions     No data to display

## 2023-05-20 NOTE — ED Notes (Signed)
When the pt was walking down the ED hallway with PT, the pt's bedding was changed and the room was reorganized.

## 2023-05-20 NOTE — Group Note (Deleted)
Date:  05/20/2023 Time:  2:12 AM  Group Topic/Focus:  Wrap-Up Group:   The focus of this group is to help patients review their daily goal of treatment and discuss progress on daily workbooks.     Participation Level:  {BHH PARTICIPATION ZOXWR:60454}  Participation Quality:  {BHH PARTICIPATION QUALITY:22265}  Affect:  {BHH AFFECT:22266}  Cognitive:  {BHH COGNITIVE:22267}  Insight: {BHH Insight2:20797}  Engagement in Group:  {BHH ENGAGEMENT IN UJWJX:91478}  Modes of Intervention:  {BHH MODES OF INTERVENTION:22269}  Additional Comments:  ***  Maglione,Martina Brodbeck E 05/20/2023, 2:12 AM

## 2023-05-20 NOTE — Progress Notes (Signed)
Mobility Specialist - Progress Note    05/20/23 1139  Mobility  Activity Ambulated with assistance in hallway  Assistive Device Front wheel walker  Distance Ambulated (ft) 160 ft  Activity Response Tolerated well  Mobility Referral Yes  $Mobility charge 1 Mobility  Mobility Specialist Start Time (ACUTE ONLY) 1110  Mobility Specialist Stop Time (ACUTE ONLY) 1139  Mobility Specialist Time Calculation (min) (ACUTE ONLY) 29 min   Pt resting in bed on RA upon entry. Pt performed bed mobility ModI just had assistance intially sitting up. Pt STS x2 and ambulates to hallway all the way down to ED glass door SBA with RW. Pt endorses pain in knee on way back. Pt returned to bed and left with needs in reach.   Johnathan Hausen Mobility Specialist 05/20/23, 1:44 PM

## 2023-05-20 NOTE — ED Provider Notes (Signed)
Emergency Medicine Observation Re-evaluation Note  Physical Exam   BP 102/67 (BP Location: Right Arm)   Pulse 64   Temp 97.9 F (36.6 C) (Oral)   Resp 16   SpO2 98%   Patient appears in no acute distress.  ED Course / MDM   No reported events during my shift at the time of this note.   Pt is awaiting dispo from SW   Pilar Jarvis MD    Pilar Jarvis, MD 05/20/23 [phone]

## 2023-05-21 NOTE — ED Notes (Signed)
Lunch and dinner orders placed for pt

## 2023-05-21 NOTE — ED Notes (Signed)
Pt ambulatory in hallway with walker with PT and translator

## 2023-05-21 NOTE — ED Notes (Signed)
Breakfast ordered for pt, pt resting with eyes closed at this time. WCTM.

## 2023-05-21 NOTE — Progress Notes (Signed)
Mobility Specialist - Progress Note    05/21/23 0902  Mobility  Activity Ambulated with assistance in hallway  Level of Assistance Modified independent, requires aide device or extra time  Assistive Device Front wheel walker  Distance Ambulated (ft) 180 ft  Range of Motion/Exercises Active  Activity Response Tolerated well  Mobility Referral Yes  $Mobility charge 1 Mobility  Mobility Specialist Start Time (ACUTE ONLY) 0830  Mobility Specialist Stop Time (ACUTE ONLY) 0900  Mobility Specialist Time Calculation (min) (ACUTE ONLY) 30 min    [employer] Mobility Specialist 05/21/23, 9:04 AM

## 2023-05-21 NOTE — ED Notes (Signed)
Interpreter to bedside.

## 2023-05-21 NOTE — ED Notes (Signed)
PT to bedside

## 2023-05-21 NOTE — ED Notes (Signed)
Assumed care of pt at this time. Pt is asleep, RR and WOB WNL, NAD. No needs identified. Call light within reach, side rails up x2, bed locked and in the lowest position.

## 2023-05-22 NOTE — Progress Notes (Signed)
Mobility Specialist - Progress Note   05/22/23 1438  Mobility  Activity Refused mobility     Pt lying in bed upon arrival, utilizing RA. Interpreter, Adelina Mings, available during session. Pt minimally interactive with Chartered loss adjuster and interpreter. Does not respond when prompted to engage in OOB activity or when asked how he is feeling. Pt pulls covers up to chin and turns away, refusing to engage in session or further questions. Will attempt another date/time.    Filiberto Pinks Mobility Specialist 05/22/23, 2:40 PM

## 2023-05-22 NOTE — TOC Progression Note (Addendum)
Transition of Care [facility]) - Progression Note    Patient Details  Name: Martin [name] MRN: [id number] Date of Birth: 05-31-1956  Transition of Care [facility]) CM/SW Contact  Darleene Cleaver, [place] Phone Number: 05/22/2023, 2:18 PM  Clinical Narrative:     CSW attempted to contact Sarina Ill from [facility] in [place], left another message for her.  2:30pm received message from Sarina Ill, she said they can not accept patient.  4:30pm, she is requesting clinicals be sent to her again to review.  Expected Discharge Plan:  (TBD) Barriers to Discharge: Continued Medical Work up  Expected Discharge Plan and Services                                               Social Determinants of Health (SDOH) Interventions SDOH Screenings   Tobacco Use: Low Risk  (04/29/2023)    Readmission Risk Interventions     No data to display

## 2023-05-22 NOTE — ED Notes (Signed)
Patient resting comfortably with eyes closed at this time. Respirations even and unlabored.

## 2023-05-22 NOTE — ED Provider Notes (Signed)
-----------------------------------------   7:14 AM on 05/22/2023 -----------------------------------------   Blood pressure (!) 95/59, pulse 65, temperature 98.2 F (36.8 C), temperature source Oral, resp. rate 14, SpO2 99%.  The patient is calm and cooperative at this time.  There have been no acute events since the last update.  Awaiting disposition plan from case management/social work.    , Layla Maw, DO 05/22/23 [phone]

## 2023-05-23 NOTE — ED Notes (Signed)
Lunch order called in for pt 

## 2023-05-23 NOTE — ED Notes (Signed)
Pt given eye drops and urinal emptied. Pt offered food but declined at this time.

## 2023-05-23 NOTE — ED Notes (Signed)
Taking over care of pt at this time

## 2023-05-23 NOTE — Progress Notes (Signed)
Mobility Specialist - Progress Note   05/23/23 1248  Mobility  Activity Ambulated with assistance in hallway  Level of Assistance Standby assist, set-up cues, supervision of patient - no hands on  Assistive Device Front wheel walker  Distance Ambulated (ft) 120 ft  Activity Response Tolerated well  $Mobility charge 1 Mobility     Pt lying in bed upon arrival, utilizing RA. Pt agreeable to activity. Interpreter Amy available for session. Pt ambulated hallway with supervision. Cueing to stay close/inside RW. No LOB. Pt voiced mild pain in L side. Pt returned to bed with needs in reach. RN notified.    Martin [name] Mobility Specialist 05/23/23, 12:54 PM

## 2023-05-23 NOTE — ED Notes (Signed)
Interpreter at bedside.

## 2023-05-23 NOTE — ED Notes (Signed)
Breakfast ordered for pt. Urinal emptied. Pt has no other needs at this time.

## 2023-05-23 NOTE — TOC Progression Note (Signed)
Transition of Care [facility]) - Progression Note    Patient Details  Name: Martin [name] MRN: [id number] Date of Birth: August 03, 1956  Transition of Care [facility]) CM/SW Contact  Darleene Cleaver, [place] Phone Number: 05/23/2023, 5:44 PM  Clinical Narrative:     Left message with Tresa Endo from [employer] [facility] [employer] [employer] to see if she has reviewed patient.  Expected Discharge Plan:  (TBD) Barriers to Discharge: Continued Medical Work up  Expected Discharge Plan and Services                                               Social Determinants of Health (SDOH) Interventions SDOH Screenings   Tobacco Use: Low Risk  (04/29/2023)    Readmission Risk Interventions     No data to display

## 2023-05-23 NOTE — ED Provider Notes (Signed)
-----------------------------------------   1:52 PM on 05/23/2023 -----------------------------------------   Blood pressure 97/65, pulse 63, temperature 97.9 F (36.6 C), temperature source Oral, resp. rate 18, SpO2 100%.  The patient is calm and cooperative at this time.  There have been no acute events since the last update.  Awaiting disposition plan from case management/social work.     Dionne Bucy, MD 05/23/23 1352

## 2023-05-24 NOTE — ED Notes (Signed)
Called dietary due to pt requesting a sausage link and bread. Informed that pt refused his lunch tray when it was delivered and it was brought back. Pt was given ensure as well and that was left.

## 2023-05-24 NOTE — ED Notes (Signed)
Assumed care of pt at this time. Pt is asleep, RR and WOB WNL, NAD. No needs identified. Call light within reach, side rails up x2, bed locked and in the lowest position.

## 2023-05-24 NOTE — ED Provider Notes (Signed)
Emergency Medicine Observation Re-evaluation Note  Martin [name] is a 67 y.o. male, seen on rounds today.  Pt initially presented to the ED for complaints of Placement/Boarder  Currently, the patient is resting in bed.    Physical Exam  BP 97/65   Pulse 63   Temp 97.9 F (36.6 C) (Oral)   Resp 18   SpO2 100%  Physical Exam General: Resting comfortably in bed  ED Course / MDM  EKG:   No labs last 24 hours  Plan  Current plan is for dispo per social work.    Trinna Post, MD 05/24/23 [phone]

## 2023-05-24 NOTE — ED Notes (Signed)
Pt sleeping at this time. No clinical signs of distress. Meal tray at bedside.

## 2023-05-24 NOTE — ED Notes (Signed)
Pt called out for assistance with urinal. Assistance provided. Pt declines further needs. Pt resting in bed free from sign of distress. Bed low and locked. Bed alarm in use. Side rails raised. Call bell and belongings in reach. Pt breathing unlabored with symmetric chest rise and fall

## 2023-05-24 NOTE — ED Notes (Signed)
Interpreter at bedside.

## 2023-05-24 NOTE — [place] FL2 (Signed)
[place] MEDICAID FL2 LEVEL OF CARE FORM     IDENTIFICATION  Patient Name: Martin [name] Birthdate: July 03, 1956 Sex: male Admission Date (Current Location): 03/30/2023  [place] and [place] Number:  [name] [id number] Facility and Address:  [facility], [address], [place], [place] [address]      Provider Number: [id number]  Attending Physician Name and Address:  No att. providers found  Relative Name and Phone Number:  Linna Darner Brother [phone]    Current Level of Care: Trinna Post MD Recommended Level of Care: Skilled Nursing Facility Prior Approval Number:    Date Approved/Denied:   PASRR Number: [id number]  Discharge Plan: SNF    Current Diagnoses: Patient Active Problem List   Diagnosis Date Noted   Abdominal pain 07/15/2020   Abdominal distension 06/04/2020   Ogilvie's syndrome    Leukopenia    Thrombocytopenia (HCC)    CKD (chronic kidney disease)    Distended abdomen    Gout    Adjustment disorder with mixed anxiety and depressed mood 03/22/2017   Lethargy 03/21/2017   Acute encephalopathy    FTT (failure to thrive) in adult    Protein-calorie malnutrition, severe 12/23/2015   Septic joint (HCC) 12/21/2015   Chronic pain syndrome 12/21/2015   Legally blind 12/21/2015   Deaf 12/21/2015   Hypothyroidism 12/21/2015   Swelling of left hand 12/21/2015   Chronic constipation     Orientation RESPIRATION BLADDER Height & Weight     Self, Time, Situation, Place  Normal Continent Weight:   Height:     BEHAVIORAL SYMPTOMS/MOOD NEUROLOGICAL BOWEL NUTRITION STATUS      Continent  (see d/c summary)  AMBULATORY STATUS COMMUNICATION OF NEEDS Skin   Supervision Verbally Other (Comment) (Colostomy LLQ)                       Personal Care Assistance Level of Assistance  Bathing, Feeding, Dressing Bathing Assistance: Limited assistance Feeding assistance: Independent Dressing Assistance: Limited assistance      Functional Limitations Info  Sight, Hearing, Speech Sight Info:  (Blind/can see withing a few inches of face) Hearing Info:  (deaf) Speech Info: Adequate    SPECIAL CARE FACTORS FREQUENCY                       Contractures Contractures Info: Not present    Additional Factors Info  Code Status, Allergies Code Status Info: Full code Allergies Info: Ivp Dye (Iodinated Contrast Media)           Current Medications (05/24/2023):  This is the current hospital active medication list Current Facility-Administered Medications  Medication Dose Route Frequency Provider Last Rate Last Admin   acetaminophen (TYLENOL) tablet 650 mg  650 mg Oral Q6H PRN Pilar Jarvis, MD   650 mg at 05/13/23 1938   alum & mag hydroxide-simeth (MAALOX/MYLANTA) 200-200-20 MG/5ML suspension 30 mL  30 mL Oral Q6H PRN Sharman Cheek, MD       diclofenac Sodium (VOLTAREN) 1 % topical gel 2 g  2 g Topical Q6H PRN Pilar Jarvis, MD       dorzolamide (TRUSOPT) 2 % ophthalmic solution 1 drop  1 drop Both Eyes BID Sharman Cheek, MD   1 drop at 05/23/23 1000   feeding supplement (ENSURE ENLIVE / ENSURE PLUS) liquid 237 mL  237 mL Oral BID BM Willy Eddy, MD   237 mL at 05/23/23 1410   hydrOXYzine (ATARAX) tablet 25 mg  25 mg Oral  TID PRN Thurston Hole H, NP   25 mg at 04/15/23 0327   ibuprofen (ADVIL) tablet 600 mg  600 mg Oral Q8H PRN Sharman Cheek, MD   600 mg at 05/05/23 1141   latanoprost (XALATAN) 0.005 % ophthalmic solution 1 drop  1 drop Both Eyes QHS Pilar Jarvis, MD   1 drop at 05/23/23 0130   OLANZapine zydis (ZYPREXA) disintegrating tablet 5 mg  5 mg Oral BID PRN Bennett, Christal H, NP       Or   OLANZapine (ZYPREXA) injection 5 mg  5 mg Intramuscular BID PRN Bennett, Christal H, NP       ondansetron (ZOFRAN) tablet 4 mg  4 mg Oral Q8H PRN Sharman Cheek, MD       polyethylene glycol (MIRALAX / GLYCOLAX) packet 17 g  17 g Oral Daily Delton Prairie, MD   17 g at 05/23/23 1016    polyvinyl alcohol (LIQUIFILM TEARS) 1.4 % ophthalmic solution 2 drop  2 drop Both Eyes PRN Merwyn Katos, MD   2 drop at 05/18/23 1833   traZODone (DESYREL) tablet 50 mg  50 mg Oral QHS PRN Bennett, Christal H, NP   50 mg at 04/19/23 0028   trolamine salicylate (ASPERCREME) 10 % cream   Topical BID PRN Merwyn Katos, MD   Given at 05/01/23 [id number]   Current Outpatient Medications  Medication Sig Dispense Refill   acetaminophen (TYLENOL) 325 MG tablet Take 650 mg by mouth every 8 (eight) hours as needed.     bimatoprost (LUMIGAN) 0.01 % SOLN Place 1 drop into both eyes at bedtime. (Patient taking differently: Place 1 drop into both eyes in the morning, at noon, and at bedtime. 0900/1300/2100) 7.5 mL 2   clonazePAM (KLONOPIN) 0.5 MG tablet Take 0.5 mg by mouth 2 (two) times daily as needed for anxiety.     diclofenac Sodium (VOLTAREN) 1 % GEL Apply 2 g topically every 6 (six) hours as needed. Apply to right shoulder     dorzolamide-timolol (COSOPT) 22.3-6.8 MG/ML ophthalmic solution Place 1 drop into both eyes 2 (two) times daily. 10 mL 2   levothyroxine (SYNTHROID) 125 MCG tablet Take 1 tablet (125 mcg total) by mouth daily before breakfast. 30 tablet 2   linaclotide (LINZESS) 290 MCG CAPS capsule Take 1 capsule (290 mcg total) by mouth daily before breakfast. 30 capsule 0   Menthol, Topical Analgesic, (BIOFREEZE) 4 % GEL Apply 1 Application topically in the morning and at bedtime.     Netarsudil Dimesylate (RHOPRESSA) 0.02 % SOLN Place 1 drop into both eyes at bedtime.     polyethylene glycol powder (GLYCOLAX/MIRALAX) 17 GM/SCOOP powder Take 17 g by mouth 2 (two) times daily. 255 g 0   traMADol (ULTRAM) 50 MG tablet Take by mouth every 12 (twelve) hours as needed.     diclofenac (VOLTAREN) 75 MG EC tablet Take 75 mg by mouth. (Patient not taking: Reported on 03/30/2023)     GABAPENTIN PO Take by mouth. (Patient not taking: Reported on 03/30/2023)       Discharge Medications: Please see  discharge summary for a list of discharge medications.  Relevant Imaging Results:  Relevant Lab Results:   Additional Information SSN [id number]  Kreg Shropshire, RN

## 2023-05-24 NOTE — TOC Progression Note (Signed)
Transition of Care [facility]) - Progression Note    Patient Details  Name: Martin [name] MRN: [id number] Date of Birth: 10-25-1955  Transition of Care [facility]) CM/SW Contact  Kreg Shropshire, RN Phone Number: 05/24/2023, 11:23 AM  Clinical Narrative:     Cm reached out to [facility]. Spoke with Hydia with admissions. She agreed to look over pt for admission. They currently have 2 patients who are deaf and blind at their facility as well. Awaiting to hear back  Phone:[phone] Fax: [phone] Email: [email or web address]  Expected Discharge Plan:  (TBD) Barriers to Discharge: Continued Medical Work up  Expected Discharge Plan and Services                                               Social Determinants of Health (SDOH) Interventions SDOH Screenings   Tobacco Use: Low Risk  (04/29/2023)    Readmission Risk Interventions     No data to display

## 2023-05-25 NOTE — ED Notes (Signed)
Patient is lying in bed at this time with eyes closed. Chest rise and fall noted. Skin appears pink. Nad noted.

## 2023-05-25 NOTE — ED Notes (Signed)
Pt is sleeping at this time. No clinical signs of distress.

## 2023-05-25 NOTE — ED Provider Notes (Signed)
Emergency Medicine Observation Re-evaluation Note  Martin [name] is a 67 y.o. male, seen on rounds today.  Pt initially presented to the ED for complaints of Placement/Boarder  Currently, the patient is is no acute distress. Denies any concerns at this time.  Physical Exam  Blood pressure (!) 89/63, pulse 74, temperature 98.3 F (36.8 C), temperature source Oral, resp. rate 15, SpO2 95%.  Physical Exam: General: No apparent distress Psych: Resting comfortably     ED Course / MDM     I have reviewed the labs performed to date as well as medications administered while in observation.  Recent changes in the last 24 hours include: No acute events overnight.  Plan   Current plan: Patient awaiting social work dispo    Corena Herter, MD 05/25/23 2051

## 2023-05-25 NOTE — ED Notes (Addendum)
Emptied patients urinal at this time. Patient lying in bed. Skin pwd. Nad noted. Resp even and unlabored.

## 2023-05-25 NOTE — ED Notes (Signed)
Visitor at bedside who brought pt breakfast. Pt sitting up in bed eating breakfast. Pt denies needs at this time.

## 2023-05-25 NOTE — ED Notes (Signed)
Patient lying with eyes closed at this time. Chest rise and fall noted. Skin appears pink. Nad noted. Call light within reach

## 2023-05-26 NOTE — ED Notes (Signed)
Patient ostomy changed at this time (new bag and new wafer). Ostomy appears to be prolapsed, beefy/pink in color. Stool is brown soft mildly formed.  Patient tolerated well. Date placed on Bag.

## 2023-05-26 NOTE — ED Notes (Signed)
Patient lying in bed with eyes closed. Chest rise and fall noted. Skin appears pink and appropriate for ethnicity. Nad noted at this time. Bed in lowest and locked position. Call light within reach.

## 2023-05-26 NOTE — ED Notes (Signed)
Patient called staff in to situate bedside table. Patient requested light turned off. Patient ate 75% of breakfast tray

## 2023-05-26 NOTE — ED Notes (Signed)
Patient lying in bed with eyes closed and cover pulled to top of head. Resp even and unlabored. Chest rise and fall noted. Skin appears pink. Nad noted at this time

## 2023-05-26 NOTE — TOC Progression Note (Addendum)
Transition of Care [facility]) - Progression Note    Patient Details  Name: Martin [name] MRN: [id number] Date of Birth: [birth year]  Transition of Care [facility]) CM/SW Contact  Kreg Shropshire, RN Phone Number: 05/26/2023, 9:44 AM  Clinical Narrative:     Cm received message from [facility] with [facility] [phone] stating they need to do an interview with pt to see if he is a good fit their facility. Cm reached out to schedule an interpretor through the interpretor services. Rep stated that they do not have anyone available at this time and to contact Annice Pih [phone] the scheduling coordinator to assit with a virtual intrepter. Hydia would like to schedule an interview before 12pm or it will have to wait until Monday. Cm LVM with Annice Pih. Awaiting to hear back.  2- Cm received call back from [place]. She stated that pt has a tactile sign language interpreter who comes by to see him everyday. Annice Pih is going to call the company to see when it is a good time they can come visit.  1229- Cm received a call from [employer] regarding setting an appt time for interview with facility. Tresa Endo told cm to all [phone] to request interpreter time. Cm will update [facility].  Expected Discharge Plan:  (TBD) Barriers to Discharge: Continued Medical Work up  Expected Discharge Plan and Services                                               Social Determinants of Health (SDOH) Interventions SDOH Screenings   Tobacco Use: Low Risk  (04/29/2023)    Readmission Risk Interventions     No data to display

## 2023-05-26 NOTE — ED Notes (Signed)
Patient alert. Patient to write meal down on whiteboard. Will pass to day shift nurse. Nad noted at this time  Urinal emptied.

## 2023-05-26 NOTE — ED Notes (Signed)
Mobility Therapist as well as the ASL interpretor was in room with RN and pt refused to get out of the bed. Pt sts " Just leave me alone, turn the lights off and get out of here." RN attempted to talk to pt but pt refused to communicate.

## 2023-05-26 NOTE — ED Notes (Signed)
This RN called and ordered patient breakfast. Egg whites with butter, english muffin, wheat toast, orange and grits ordered for patient.

## 2023-05-26 NOTE — ED Notes (Signed)
Patient lying in bed with eyes closed at this time. Chest rise and fall noted. Skin appears pink. Resp even and unlabored. Nad noted at this time

## 2023-05-26 NOTE — ED Provider Notes (Signed)
Emergency Medicine Observation Re-evaluation Note  Jamire Macnab is a 67 y.o. male, seen on rounds today.  Pt initially presented to the ED for complaints of Placement/Boarder  Currently, the patient is resting in bed. No reported issues from nursing team.   Physical Exam  BP 114/66 (BP Location: Right Arm)   Pulse 66   Temp 98.1 F (36.7 C) (Oral)   Resp 15   SpO2 97%  Physical Exam General: Resting in bed  ED Course / MDM   No labs last 24 hours  Plan  Current plan is for dispo per social work.    Trinna Post, MD 05/26/23 2138

## 2023-05-26 NOTE — ED Notes (Signed)
Patients breakfast tray set up on side table at bedside. Patient refusing to eat at this time. Patient will not let RN check vitals at this time. Will try again when Sign language interpretor arrives. Patient allowed RN to give eye drops and documented under [facility]

## 2023-05-27 NOTE — ED Notes (Signed)
Pt sleeping. There is a lot of food in pt room, this RN was told that pt said last night people were stealing his food when they threw it away. This RN will wait for pt to wake up to clean up room.

## 2023-05-27 NOTE — ED Notes (Signed)
Pt did not want his dinner tray at this time, left at bedside in case pt changes his mind.

## 2023-05-27 NOTE — ED Notes (Signed)
Emptied pt urinal, pt eating chips as a snack. Denies other needs

## 2023-05-27 NOTE — ED Notes (Signed)
This RN ordered pt lunch and dinner per pt request. Pt resting at this time

## 2023-05-27 NOTE — ED Notes (Signed)
This RN emptied pt colostomy. Per interpreter if pt shakes it is just means he is not feeling the food, does not want it. This RN will order pt requesting dinner meal. Pt denies other needs

## 2023-05-27 NOTE — ED Notes (Signed)
Pt eating breakfast at this time.  

## 2023-05-27 NOTE — ED Notes (Signed)
Patient assisted with urinal and hand sanitizer.  Patient refused eyedrops.

## 2023-05-27 NOTE — ED Notes (Signed)
Interpreter here, threw old food away

## 2023-05-27 NOTE — ED Notes (Signed)
Pt sleeping at this time.

## 2023-05-28 NOTE — ED Notes (Signed)
Patient bed pad and brief changed. Bed bath done. Colostomy bag emptied. Patient requested RN to "check my ass for roaches." RN and NT cleaned and assessed patient. No bugs noted.

## 2023-05-28 NOTE — ED Provider Notes (Signed)
Emergency Medicine Observation Re-evaluation Note  Physical Exam   BP 96/62 (BP Location: Right Arm)   Pulse 65   Temp 97.6 F (36.4 C) (Oral)   Resp 18   SpO2 100%   Patient appears in no acute distress.  ED Course / MDM   No reported events during my shift at the time of this note.   Pt is awaiting dispo from SW   Pilar Jarvis MD    Pilar Jarvis, MD 05/28/23 [phone]

## 2023-05-28 NOTE — ED Provider Notes (Signed)
Emergency Medicine Observation Re-evaluation Note  Martin [name] is a 67 y.o. male, currently boarding in the emergency department.  No acute events since last update. Physical Exam  BP 111/69   Pulse 61   Temp 97.9 F (36.6 C) (Oral)   Resp 18   SpO2 96%   ED Course / MDM   No recent lab work available for review.  Plan  Current plan is for placement to an appropriate living facility once available.  Social worker is currently working with the patient to achieve this.Minna Antis, MD 05/28/23 [phone]

## 2023-05-28 NOTE — ED Notes (Signed)
Breakfast ordered at this time. Patient resting comfortably.

## 2023-05-29 NOTE — ED Notes (Signed)
Pt denies any needs at this time.

## 2023-05-29 NOTE — TOC Progression Note (Addendum)
Transition of Care [facility]) - Progression Note    Patient Details  Name: Martin [name] MRN: [id number] Date of Birth: 26-Jun-1956  Transition of Care [facility]) CM/SW Contact  Kreg Shropshire, RN Phone Number: 05/29/2023, 9:01 AM  Clinical Narrative:     Cm LVM for Interpretor coordinator [phone] to see when there will be a interpretor on site to speak with pt. Cm also messaged nurse to ask when the interpretor will be at bedside. Cm needs to coordinate interview with [facility]. Cm called [facility] and received busy signal. Cm sent secure message to Hydia [phone] to inform about trying to set up interview  1643- After speaking with pt regarding d/c planning. Cm informed pt regarding [facility]. He does not want to go to another SNF or assisted Living. He wants to have his own apartment near [place] where he is from. Cm will contact SW near [place] area who assist with death and blind.  Expected Discharge Plan:  (TBD) Barriers to Discharge: Continued Medical Work up  Expected Discharge Plan and Services                                               Social Determinants of Health (SDOH) Interventions SDOH Screenings   Tobacco Use: Low Risk  (04/29/2023)    Readmission Risk Interventions     No data to display

## 2023-05-29 NOTE — ED Notes (Addendum)
Interpreter at bedside.

## 2023-05-29 NOTE — ED Notes (Signed)
Dinner ordered for pt, pt denies needs. Interpreter reports someone will be back tomorrow to see him.

## 2023-05-29 NOTE — ED Notes (Addendum)
Interpreter left at this time, Pt denies needs at this time. Previous RN ordered pt lunch and dinner requests for the day.

## 2023-05-29 NOTE — ED Notes (Signed)
Breakfast ordered for pt, pt resting with eyes closed at this time

## 2023-05-29 NOTE — ED Provider Notes (Signed)
Emergency Medicine Observation Re-evaluation Note  Physical Exam   BP 111/69   Pulse 61   Temp 97.9 F (36.6 C) (Oral)   Resp 18   SpO2 96%   Patient appears in no acute distress.  ED Course / MDM   No reported events during my shift at the time of this note.   Pt is awaiting dispo from SW   Pilar Jarvis MD    Pilar Jarvis, MD 05/29/23 [phone]

## 2023-05-29 NOTE — ED Notes (Signed)
Pt urinal emptied, and new covers applied. Pt lunch tray thrown away, pt denies needs at this time

## 2023-05-29 NOTE — ED Notes (Signed)
Pt made aware of dinner being at bedside and pt pushed tray away and did not attempt to eat his dinner.

## 2023-05-29 NOTE — ED Notes (Signed)
Lunch ordered for pt

## 2023-05-29 NOTE — ED Notes (Signed)
Pt resting at this time.

## 2023-05-30 NOTE — ED Provider Notes (Signed)
-----------------------------------------   5:18 AM on 05/30/2023 -----------------------------------------   Blood pressure 97/63, pulse 72, temperature 98.2 F (36.8 C), temperature source Oral, resp. rate 16, SpO2 98%.  The patient is calm and cooperative at this time.  There have been no acute events since the last update.  Awaiting disposition plan from case management/social work.    Shelly Spenser, Layla Maw, DO 05/30/23 [phone]

## 2023-05-30 NOTE — ED Notes (Signed)
 Assumed care of pt at this time. Pt is asleep, RR and WOB WNL, NAD. No needs identified. Call light within reach, side rails up x2, bed locked and in the lowest position.

## 2023-05-30 NOTE — ED Notes (Signed)
Pt resting in bed at this time, denies having needs or concerns. Urinal emptied per pt request.

## 2023-05-31 NOTE — ED Notes (Signed)
Pts colostomy bag emptied

## 2023-05-31 NOTE — ED Notes (Signed)
Pt awake, pillows fluffed, pt repositioned.

## 2023-05-31 NOTE — TOC Progression Note (Signed)
Transition of Care [facility]) - Progression Note    Patient Details  Name: Martin [name] MRN: [id number] Date of Birth: January 12, 1956  [facility]) CM/SW Contact  Kreg Shropshire, RN Phone Number: 05/31/2023, 12:07 PM  Clinical Narrative:    Cm reached out to Antonio Parents, SW for the Blind at [facility], [phone] LVM for her to give me a call back for additional services and resources  Cm LVM for Communication services for the deaf and hard of hearing. [phone] for additional services and resources.   Expected Discharge Plan:  (TBD) Barriers to Discharge: Continued Medical Work up  Expected Discharge Plan and Services                                               Social Determinants of Health (SDOH) Interventions SDOH Screenings   Tobacco Use: Low Risk  (04/29/2023)    Readmission Risk Interventions     No data to display

## 2023-05-31 NOTE — ED Provider Notes (Signed)
Emergency Medicine Observation Re-evaluation Note  Physical Exam   BP 114/63   Pulse 72   Temp 98 F (36.7 C)   Resp 14   SpO2 96%   Patient appears in no acute distress.  ED Course / MDM   No reported events during my shift at the time of this note.   Pt is awaiting dispo from SW   Pilar Jarvis MD    Pilar Jarvis, MD 05/31/23 [phone]

## 2023-06-01 NOTE — ED Notes (Signed)
Pt ate 100% of meal independently.

## 2023-06-01 NOTE — ED Notes (Signed)
Interpreter at bedside. Ordered breakfast for patient. Changed Ostomy bag per pt request. Gave morning medications. Updated vital signs. Pt denies further needs at this time. Call light within reach.

## 2023-06-01 NOTE — ED Notes (Signed)
Set up pt with meal. Pt eating independently after setup. Emptied urinal.

## 2023-06-01 NOTE — ED Notes (Signed)
Pt ambulated 500 feet in the hall with walker and interpreter. Pt tolerated it well

## 2023-06-01 NOTE — ED Provider Notes (Signed)
-----------------------------------------   7:02 AM on 06/01/2023 -----------------------------------------   Blood pressure 106/65, pulse 65, temperature 97.9 F (36.6 C), temperature source Oral, resp. rate 18, SpO2 95%.  The patient is calm and cooperative at this time.  There have been no acute events since the last update.  Awaiting disposition plan from case management/social work.    Ender Rorke, Layla Maw, DO 06/01/23 [phone]

## 2023-06-02 NOTE — Progress Notes (Signed)
Mobility Specialist - Progress Note    06/02/23 1500  Mobility  Activity Ambulated with assistance in hallway  Level of Assistance Modified independent, requires aide device or extra time  Assistive Device Front wheel walker  Distance Ambulated (ft) 140 ft  Range of Motion/Exercises Active  Activity Response Tolerated well  Mobility Referral Yes  $Mobility charge 1 Mobility  Mobility Specialist Start Time (ACUTE ONLY) 1507  Mobility Specialist Stop Time (ACUTE ONLY) 1549  Mobility Specialist Time Calculation (min) (ACUTE ONLY) 42 min   Pt resting in bed signing with interpreter upon entry. Pt STS and ambulates to outside door and back ModI with RW. Pt endorses pain in arms, walker raised to suit patient height and minimize cramps. Pt responds well to manual cuing and signaling with author. Pt returned to bed and left with needs in reach. Interpreter present at bedside.   Martin [name] Mobility Specialist 06/02/23, 3:53 PM

## 2023-06-02 NOTE — ED Notes (Signed)
Changed pt's brief. Placed fresh linens on bed.

## 2023-06-02 NOTE — ED Notes (Signed)
Pt resting comfortably with eyes closed at this time. Respirations even and unlabored. NAD

## 2023-06-02 NOTE — ED Provider Notes (Signed)
-----------------------------------------   5:02 AM on 06/02/2023 -----------------------------------------   Blood pressure 94/64, pulse 64, temperature 97.7 F (36.5 C), resp. rate 15, SpO2 96%.  The patient is calm and cooperative at this time.  There have been no acute events since the last update.  Awaiting disposition plan from Social Work team.   Irean Hong, MD 06/02/23 [phone]

## 2023-06-02 NOTE — TOC Progression Note (Addendum)
Transition of Care [facility]) - Progression Note    Patient Details  Name: Martin [name] MRN: [id number] Date of Birth: 1956-06-27  Transition of Care [facility] CM/SW Contact  Kreg Shropshire, RN Phone Number: 06/02/2023, 11:03 AM  Clinical Narrative:    CM resent bed search for 54 facilities in the area for long term medicaid bed. Cm included note with bed search  Lenda Kelp, DeafBlind Services Specialist from [facility], they will provide 4 free basic ASL Classes for whichever SNF can accept him.   Cm called the following SNF admission offices in [place], [facility] [phone]- No male medicaid beds available  [facility] [phone] full  [facility] [phone]- LVM with admissions coordinatator Asher [facility]ir [facility] [phone]- facility is full and has no medicaid beds available.   [facility]  Cm spoke with Lattie Corns with admissions [phone] [email or web address] and she is going to check with her associate director Lupita Leash to see if pt can meet pt needs. Cm will send information to her for review. Lattie Corns is also going to see about a group home in the [place] area that helps patients who are blind. She is going to get back with cm.   Cm reached out to [facility] [phone] to be directed to anyone in the [place] area who can help with deaf/blind resources. Cm Lvm for [facility] Dike [phone] Division of death and hard of hearing division.   1456- Cm touched base with pt due with intrepter Amy. Cm explained that outreach has been made to facilities in [place]. Pt was angry regarding going to another nursing facility. Pt does not want to go to another nursing facility due to the bad experiences in the past. He wants to go to an apartment and live alone. Cm asked if pt would be able to take care of himself such as transportation, doctors appointments, cooking, household duties. He stated he would try  to do these activities. He does not have any family or friends in [place] who can help him transition there. Cm explained that group home,nursing facilities, or Assisted Living can help him with his ADL's. Pt was very upset and did not want to talk. Cm stated that she is still trying to help him and make sure he has a safe d/c. Pt does not have anyone who can help him transition in the [place] area or any area. Cm agreed to talk with pt next week and f/u on referrals.  Expected Discharge Plan:  (TBD) Barriers to Discharge: Continued Medical Work up  Expected Discharge Plan and Services                                               Social Determinants of Health (SDOH) Interventions SDOH Screenings   Tobacco Use: Low Risk  (04/29/2023)    Readmission Risk Interventions     No data to display

## 2023-06-03 NOTE — ED Notes (Signed)
Pt shaved using electric razor by this RN. Pt tolerated well, pt cleaned of hair.

## 2023-06-03 NOTE — Progress Notes (Signed)
Mobility Specialist - Progress Note      06/03/23 0929  Mobility  Activity Dangled on edge of bed;Repositioned in chair;Refused mobility (repositioned in BED, refused further mobility)  $Mobility charge 1 Mobility   Pt resting in bed signing with interpreter upon entry on RA. Pt sad/discouraged due to placement options and did not want to participate in further mobility. Pt given education on maintaining consistent mobility to improve strength and showing upward trend in independence would improve overall placement options (RN confirmed). Pt acknowledged understanding and agreed to ambulation tomorrow.   Martin [name] Mobility Specialist 06/03/23, 9:35 AM

## 2023-06-04 NOTE — ED Notes (Signed)
PT to bedside with translator

## 2023-06-04 NOTE — ED Notes (Signed)
Breakfast ordered for pt. Pt assisted with urinal, urinal emptied. Pt denies further needs at this time, WCTM.

## 2023-06-04 NOTE — ED Notes (Signed)
Colostomy bag and base changed

## 2023-06-04 NOTE — ED Provider Notes (Signed)
-----------------------------------------   6:21 AM on 06/04/2023 -----------------------------------------   Blood pressure 102/67, pulse 65, temperature 97.7 F (36.5 C), resp. rate 16, SpO2 98%.  The patient is calm and cooperative at this time.  There have been no acute events since the last update.  Awaiting disposition plan from Social Work team.   Irean Hong, MD 06/04/23 [phone]

## 2023-06-04 NOTE — ED Notes (Addendum)
Interpreter to bedside, lunch and dinner ordered for pt. Bedding straightened per pt request. Pt denies needs. VSS.

## 2023-06-04 NOTE — Progress Notes (Signed)
Mobility Specialist - Progress Note     06/04/23 1306  Mobility  Activity Ambulated with assistance in hallway  Level of Assistance Modified independent, requires aide device or extra time  Assistive Device Front wheel walker  Distance Ambulated (ft) 200 ft  Range of Motion/Exercises Active  Activity Response Tolerated well  Mobility Referral Yes  $Mobility charge 1 Mobility  Mobility Specialist Start Time (ACUTE ONLY) 1222  Mobility Specialist Stop Time (ACUTE ONLY) 1300  Mobility Specialist Time Calculation (min) (ACUTE ONLY) 38 min   Pt resting in bed on RA upon entry. Pt  STS and ambulates to outside area ModI with RW. Pt returned to bed and left with needs in reach.    Johnathan Hausen Mobility Specialist 06/04/23, 2:03 PM

## 2023-06-05 LAB — COMPREHENSIVE METABOLIC PANEL
ALT: 27 U/L (ref 0–44)
AST: 41 U/L (ref 15–41)
Albumin: 4.2 g/dL (ref 3.5–5.0)
Alkaline Phosphatase: 47 U/L (ref 38–126)
Anion gap: 9 (ref 5–15)
BUN: 26 mg/dL — ABNORMAL HIGH (ref 8–23)
CO2: 30 mmol/L (ref 22–32)
Calcium: 10.1 mg/dL (ref 8.9–10.3)
Chloride: 101 mmol/L (ref 98–111)
Creatinine, Ser: 1.17 mg/dL (ref 0.61–1.24)
GFR, Estimated: >60 mL/min (ref >60)
Glucose, Bld: 110 mg/dL — ABNORMAL HIGH (ref 70–99)
Potassium: 4.5 mmol/L (ref 3.5–5.1)
Sodium: 140 mmol/L (ref 135–145)
Total Bilirubin: 0.5 mg/dL (ref 0.3–1.2)
Total Protein: 8.4 g/dL — ABNORMAL HIGH (ref 6.5–8.1)

## 2023-06-05 LAB — CBC WITH DIFFERENTIAL/PLATELET
Abs Immature Granulocytes: 0.01 10*3/uL (ref 0.00–0.07)
Basophils Absolute: 0 10*3/uL (ref 0.0–0.1)
Basophils Relative: 1 %
Eosinophils Absolute: 0.1 10*3/uL (ref 0.0–0.5)
Eosinophils Relative: 2 %
HCT: 30.2 % — ABNORMAL LOW (ref 39.0–52.0)
Hemoglobin: 9.6 g/dL — ABNORMAL LOW (ref 13.0–17.0)
Immature Granulocytes: 0 %
Lymphocytes Relative: 30 %
Lymphs Abs: 1.2 10*3/uL (ref 0.7–4.0)
MCH: 30.7 pg (ref 26.0–34.0)
MCHC: 31.8 g/dL (ref 30.0–36.0)
MCV: 96.5 fL (ref 80.0–100.0)
Monocytes Absolute: 0.3 10*3/uL (ref 0.1–1.0)
Monocytes Relative: 8 %
Neutro Abs: 2.4 10*3/uL (ref 1.7–7.7)
Neutrophils Relative %: 59 %
Platelets: 143 10*3/uL — ABNORMAL LOW (ref 150–400)
RBC: 3.13 MIL/uL — ABNORMAL LOW (ref 4.22–5.81)
RDW: 14.9 % (ref 11.5–15.5)
WBC: 4 10*3/uL (ref 4.0–10.5)
nRBC: 0 % (ref 0.0–0.2)

## 2023-06-05 LAB — CBG MONITORING, ED: Glucose-Capillary: 82 mg/dL (ref 70–99)

## 2023-06-05 LAB — TROPONIN I (HIGH SENSITIVITY): Troponin I (High Sensitivity): 9 ng/L (ref <18)

## 2023-06-05 MED ORDER — SODIUM CHLORIDE 0.9 % IV BOLUS: 500.0000 mL | Freq: Once | INTRAVENOUS | Status: DC

## 2023-06-05 NOTE — TOC Progression Note (Signed)
Transition of Care [facility]) - Progression Note    Patient Details  Name: Martin [name] MRN: [id number] Date of Birth: 23-Feb-1956  Transition [facility] Care [facility]) CM/SW Contact  Kreg Shropshire, RN Phone Number: 06/05/2023, 1:32 PM  Clinical Narrative:     Cm received message from [facility] [phone] and Efraim Kaufmann stated they are not accepting any Medicaid long term beds at this time  Cm sent secure message to admissions Jennette with [facility] [email or web address].   Expected Discharge Plan:  (TBD) Barriers to Discharge: Continued Medical Work up  Expected Discharge Plan and Services                                               Social Determinants of Health (SDOH) Interventions SDOH Screenings   Tobacco Use: Low Risk  (04/29/2023)    Readmission Risk Interventions     No data to display

## 2023-06-05 NOTE — NC FL2 (Signed)
[place] MEDICAID FL2 LEVEL OF CARE FORM     IDENTIFICATION  Patient Name: Martin [name] Birthdate: Feb 18, 1956 Sex: male Admission Date (Current Location): 03/30/2023  [place] and [place] Number:  [name] [id number] R Facility and Address:  [facility], [address] [place], [place] [address]      Provider Number: [id number]  Attending Physician Name and Address:  Phineas Semen, MD  Relative Name and Phone Number:  Linna Darner Brother [phone]    Current Level of Care: Hospital Recommended Level of Care: Skilled Nursing Facility Prior Approval Number:    Date Approved/Denied:   PASRR Number: [id number]  Discharge Plan: SNF    Current Diagnoses: Patient Active Problem List   Diagnosis Date Noted   Abdominal pain 07/15/2020   Abdominal distension 06/04/2020   Ogilvie's syndrome    Leukopenia    Thrombocytopenia (HCC)    CKD (chronic kidney disease)    Distended abdomen    Gout    Adjustment disorder with mixed anxiety and depressed mood 03/22/2017   Lethargy 03/21/2017   Acute encephalopathy    FTT (failure to thrive) in adult    Protein-calorie malnutrition, severe 12/23/2015   Septic joint (HCC) 12/21/2015   Chronic pain syndrome 12/21/2015   Legally blind 12/21/2015   Deaf 12/21/2015   Hypothyroidism 12/21/2015   Swelling of left hand 12/21/2015   Chronic constipation     Orientation RESPIRATION BLADDER Height & Weight     Self, Time, Place, Situation  Normal Continent Weight:   Height:     BEHAVIORAL SYMPTOMS/MOOD NEUROLOGICAL BOWEL NUTRITION STATUS      Colostomy  (see d/c summary)  AMBULATORY STATUS COMMUNICATION OF NEEDS Skin   Limited Assist Non-Verbally (Sign Language) Other (Comment) (Colostomy LLQ)                       Personal Care Assistance Level of Assistance  Bathing, Feeding, Dressing Bathing Assistance: Limited assistance Feeding assistance: Limited assistance Dressing Assistance: Limited  assistance     Functional Limitations Info  Sight, Hearing Sight Info: Impaired Hearing Info: Impaired Speech Info: Adequate    SPECIAL CARE FACTORS FREQUENCY                       Contractures Contractures Info: Not present    Additional Factors Info  Code Status, Allergies Code Status Info: Full code Allergies Info: Ivp Dye (Iodinated Contrast Media)           Current Medications (06/05/2023):  This is the current hospital active medication list Current Facility-Administered Medications  Medication Dose Route Frequency Provider Last Rate Last Admin   acetaminophen (TYLENOL) tablet 650 mg  650 mg Oral Q6H PRN Pilar Jarvis, MD   650 mg at 05/13/23 1938   alum & mag hydroxide-simeth (MAALOX/MYLANTA) 200-200-20 MG/5ML suspension 30 mL  30 mL Oral Q6H PRN Sharman Cheek, MD       diclofenac Sodium (VOLTAREN) 1 % topical gel 2 g  2 g Topical Q6H PRN Pilar Jarvis, MD       dorzolamide (TRUSOPT) 2 % ophthalmic solution 1 drop  1 drop Both Eyes BID Sharman Cheek, MD   1 drop at 06/05/23 1021   feeding supplement (ENSURE ENLIVE / ENSURE PLUS) liquid 237 mL  237 mL Oral BID BM Willy Eddy, MD   237 mL at 06/05/23 1021   hydrOXYzine (ATARAX) tablet 25 mg  25 mg Oral TID PRN Hampton Abbot, NP   25  mg at 04/15/23 0327   latanoprost (XALATAN) 0.005 % ophthalmic solution 1 drop  1 drop Both Eyes QHS Pilar Jarvis, MD   1 drop at 06/03/23 2250   OLANZapine zydis (ZYPREXA) disintegrating tablet 5 mg  5 mg Oral BID PRN Bennett, Christal H, NP       Or   OLANZapine (ZYPREXA) injection 5 mg  5 mg Intramuscular BID PRN Bennett, Christal H, NP       ondansetron (ZOFRAN) tablet 4 mg  4 mg Oral Q8H PRN Sharman Cheek, MD       polyethylene glycol (MIRALAX / GLYCOLAX) packet 17 g  17 g Oral Daily Delton Prairie, MD   17 g at 06/05/23 1021   polyvinyl alcohol (LIQUIFILM TEARS) 1.4 % ophthalmic solution 2 drop  2 drop Both Eyes PRN Merwyn Katos, MD   2 drop at 06/05/23 0457    sodium chloride 0.9 % bolus 500 mL  500 mL Intravenous Once Phineas Semen, MD       traZODone (DESYREL) tablet 50 mg  50 mg Oral QHS PRN Willeen Cass, Christal H, NP   50 mg at 04/19/23 0028   trolamine salicylate (ASPERCREME) 10 % cream   Topical BID PRN Merwyn Katos, MD   Given at 06/05/23 0507   Current Outpatient Medications  Medication Sig Dispense Refill   acetaminophen (TYLENOL) 325 MG tablet Take 650 mg by mouth every 8 (eight) hours as needed.     bimatoprost (LUMIGAN) 0.01 % SOLN Place 1 drop into both eyes at bedtime. (Patient taking differently: Place 1 drop into both eyes in the morning, at noon, and at bedtime. 0900/1300/2100) 7.5 mL 2   clonazePAM (KLONOPIN) 0.5 MG tablet Take 0.5 mg by mouth 2 (two) times daily as needed for anxiety.     diclofenac Sodium (VOLTAREN) 1 % GEL Apply 2 g topically every 6 (six) hours as needed. Apply to right shoulder     dorzolamide-timolol (COSOPT) 22.3-6.8 MG/ML ophthalmic solution Place 1 drop into both eyes 2 (two) times daily. 10 mL 2   levothyroxine (SYNTHROID) 125 MCG tablet Take 1 tablet (125 mcg total) by mouth daily before breakfast. 30 tablet 2   linaclotide (LINZESS) 290 MCG CAPS capsule Take 1 capsule (290 mcg total) by mouth daily before breakfast. 30 capsule 0   Menthol, Topical Analgesic, (BIOFREEZE) 4 % GEL Apply 1 Application topically in the morning and at bedtime.     Netarsudil Dimesylate (RHOPRESSA) 0.02 % SOLN Place 1 drop into both eyes at bedtime.     polyethylene glycol powder (GLYCOLAX/MIRALAX) 17 GM/SCOOP powder Take 17 g by mouth 2 (two) times daily. 255 g 0   traMADol (ULTRAM) 50 MG tablet Take by mouth every 12 (twelve) hours as needed.     diclofenac (VOLTAREN) 75 MG EC tablet Take 75 mg by mouth. (Patient not taking: Reported on 03/30/2023)     GABAPENTIN PO Take by mouth. (Patient not taking: Reported on 03/30/2023)       Discharge Medications: Please see discharge summary for a list of discharge  medications.  Relevant Imaging Results:  Relevant Lab Results:   Additional Information SSN [id number]  Kreg Shropshire, RN

## 2023-06-05 NOTE — ED Notes (Signed)
Pt repositioned, heels lifted off bed, and pillow support provided. Pt given cereal and juice per request. No there needs at this time.

## 2023-06-05 NOTE — ED Notes (Signed)
Patient was ambulating with mobility and interpreter when he experienced a syncopal episode. Patient was lowered back down into bed. Unresponsive for approx 2 minutes. EDP called to bedside. Patient now alert and communicating with interpreter. CBG 82. Patient refusing IV but will allow RN to straight stick for labs.

## 2023-06-05 NOTE — ED Provider Notes (Signed)
Emergency Medicine Observation Re-evaluation Note  Makson Fusillo is a 67 y.o. male, seen on rounds today.  Pt initially presented to the ED for complaints of Placement/Boarder  Currently, the patient is calm, no acute complaints.  Physical Exam  Blood pressure 107/71, pulse 62, temperature 98.2 F (36.8 C), temperature source Oral, resp. rate 15, SpO2 98%. Physical Exam General: NAD Lungs: CTAB Psych: not agitated  ED Course / MDM  EKG:    I have reviewed the labs performed to date as well as medications administered while in observation.  Recent changes in the last 24 hours include episode of syncope while upright with mobility specialist. Lab panel drawn and unremarkable. Pt refuses repeat lab draw for troponin trending.  Per report, pt denied any acute symptoms, orthostatic syncope suspected.    Plan  Current plan is for TOC placement.   Sharman Cheek, MD 06/05/23 [phone]

## 2023-06-05 NOTE — Progress Notes (Signed)
Mobility Specialist - Progress Note   Pre-mobility: HR, BP(107/81), SpO2      06/05/23 1155  Mobility  Activity Ambulated with assistance in hallway;Stood at bedside  Level of Assistance Modified independent, requires aide device or extra time  Assistive Device Front wheel walker  Distance Ambulated (ft) 80 ft  Range of Motion/Exercises Active  RLE Weight Bearing WBAT  Activity Response Tolerated well  Mobility Referral Yes  $Mobility charge 1 Mobility  Mobility Specialist Start Time (ACUTE ONLY) 1100  Mobility Specialist Stop Time (ACUTE ONLY) 1156  Mobility Specialist Time Calculation (min) (ACUTE ONLY) 56 min   Pt resting in bed on RA upon entry, Interpreter communicating with pt at bedside. Pt initially upset about session being this early but, agreeable to participate in ambulation. Pt STS x2 to perform hygiene care. Pt begins to endorse pain in left neck and arm during stand and quick assisted lowering back to bed in syncopal episode for 2-3 minutes and unresponsive to interpreter. Pt slid up in bed and labs taken/telemetry applied. Pt declines IV hydration but its very adamant about walking. Pt rests for 10 minutes before attempting ambulation. Pt STS and ambulates to hallway SBA/CGA (safety) with RW. Pt returned to bed and left with needs in reach.    Johnathan Hausen Mobility Specialist 06/05/23, 12:14 PM

## 2023-06-06 NOTE — ED Notes (Signed)
Pt sleeping. No clinical signs of distress observed.

## 2023-06-06 NOTE — ED Notes (Signed)
Pt sleeping at this time. No clinical signs of distress observed.

## 2023-06-06 NOTE — ED Provider Notes (Signed)
Emergency Medicine Observation Re-evaluation Note  Currently, the patient is resting in bed. No reported issues from nursing team.   Physical Exam  BP 98/61   Pulse 67   Temp 98.3 F (36.8 C) (Oral)   Resp 15   SpO2 96%  Physical Exam General: Resting in bed  ED Course / MDM  Labs from yesterday with stable anemia, no critical derangements  Plan  Current plan is for dispo per social work.    Trinna Post, MD 06/06/23 [phone]

## 2023-06-06 NOTE — ED Notes (Signed)
 Assumed care of pt at this time. Pt is asleep, RR and WOB WNL, NAD. No needs identified. Call light within reach, side rails up x2, bed locked and in the lowest position.

## 2023-06-06 NOTE — ED Provider Notes (Signed)
Emergency Medicine Observation Re-evaluation Note  Physical Exam   BP 107/71   Pulse 62   Temp 98.2 F (36.8 C) (Oral)   Resp 15   SpO2 98%   Patient appears in no acute distress.  ED Course / MDM   No reported events during my shift at the time of this note.   Pt is awaiting dispo from SW   Pilar Jarvis MD    Pilar Jarvis, MD 06/06/23 [phone]

## 2023-06-07 NOTE — TOC Progression Note (Addendum)
Transition of Care [facility]) - Progression Note    Patient Details  Name: Martin [name] MRN: [id number] Date of Birth: Feb 10, 1956  Transition of Care [facility]) CM/SW Contact  Kreg Shropshire, RN Phone Number: 06/07/2023, 9:40 AM  Clinical Narrative:     Cm f/u with Al Pimple with [facility] admissions and LVM [phone] on referral sent to her about pt a few days ago. Cm awaiting to hear back  Cm already made referrals to the following with no availability:  [facility] [employer] [facility]   Cm called other  facilities in [place] and surronding areas according to [email or web address].   Cm spoke with rep at [facility] [phone] and LVM about referral.  Cm LVM for Abel Presto of [facility] [phone].  1400-Cm had meeting with [facility] about deaf/blind services. They stated that by law facilities have to have sign language interpreters in facilities. The sign language intrepters are able to teach staff sign language. An interpreter will also be provided for important services such as MD appts.    Expected Discharge Plan:  (TBD) Barriers to Discharge: Continued Medical Work up  Expected Discharge Plan and Services                                               Social Determinants of Health (SDOH) Interventions SDOH Screenings   Tobacco Use: Low Risk  (04/29/2023)    Readmission Risk Interventions     No data to display

## 2023-06-07 NOTE — ED Notes (Signed)
Pt's ostomy cleaned and new ostomy appliance placed on pt.

## 2023-06-07 NOTE — ED Notes (Signed)
Pt cleaned self. Clean disposable underwear provided for pt. Lunch and dinner orders placed. No other needs at this time.

## 2023-06-07 NOTE — ED Provider Notes (Signed)
Emergency Medicine Observation Re-evaluation Note  Physical Exam   BP 98/61   Pulse 67   Temp 98.3 F (36.8 C) (Oral)   Resp 15   SpO2 96%   Patient appears in no acute distress.  ED Course / MDM   No reported events during my shift at the time of this note.   Pt is awaiting dispo from SW   Pilar Jarvis MD    Pilar Jarvis, MD 06/07/23 [phone]

## 2023-06-07 NOTE — ED Notes (Signed)
Patient resting with eyes closed at this time. Respirations even and unlabored. NAD.

## 2023-06-08 NOTE — ED Notes (Addendum)
Assumed care at this time, Pt is sleeping, NADN.

## 2023-06-08 NOTE — ED Notes (Signed)
Offered pt meal tray, he declines at this time.  Sat it on his bedside table.  Gave pt Ensure over ice and sat it on his bedside table when he did not immediately want to drink it.  Pt voided in urinal, emptied this for pt.  Helped him straighten blankets and dimmed lights back per pt wishes, communication using communication board.

## 2023-06-08 NOTE — TOC Progression Note (Signed)
Transition of Care [facility]) - Progression Note    Patient Details  Name: Martin [name] MRN: [id number] Date of Birth: [birth year]  Transition [facility] Care [facility]) CM/SW Contact  Kreg Shropshire, RN Phone Number: 06/08/2023, 11:25 AM  Clinical Narrative:     Orlene Erm with pt was interpreted by sign language Interpreter Mark. Cm Supervisor Jiles Crocker and cm spoke to pt regarding d/c planning.   Cm and Supervisor explained that we want to help pt with a safe d/c to a facility that will help him and provide the care that he needs 24/7. Pt stated to interpreter that he wants an apartment because he does not want people taking his stuff and not being able to communicate. Pt stated "You cannot tell me what to do."  Cm and Supervisor explained that pt cannot stay in the ED and if we have permission to fax/send out his clinical information to nursing homes/SNF. Cm explained that we can send his information to facilities in [place] and see if they can accept depending on availability.   Cm and Supervisor explained that we cannot set him up an apartment because we cannot deposit money for him or set up housing in [place]. We stated a SW at the facility in [place] or any other facility can help with those transitional needs.   We asked pt can he cook for himself, bathe himself, transport himself, change colostomy. Pt stated via interpreter the "I will." Supervisor stated that he has not been doing that while in the ED.   Cm and Supervisor asked if we could send his information to [place]/surrounding areas as well as [employer]. Pt still stated that he wants an apartment. Cm and supervisor stated again that we need a safe discharge. Pt does not have any family nor friends who can help him with this transition.   Pt then stated via Interpreter that "We don't care about deaf and blind people." Cm and supervisor stated via Interpreter that we do care about him and we want to make sure he has a safe discharge.   Pt  was also concerned about having Interpreter at the facility. Cm and supervisor stated by law that facilities are required to get interpreters.   Pt was still upset with cm and supervisor. Cm continue to follow for toc needs and will talk with pt again via interpreter services.     Expected Discharge Plan:  (TBD) Barriers to Discharge: Continued Medical Work up  Expected Discharge Plan and Services                                               Social Determinants of Health (SDOH) Interventions SDOH Screenings   Tobacco Use: Low Risk  (04/29/2023)    Readmission Risk Interventions     No data to display

## 2023-06-08 NOTE — ED Notes (Addendum)
CM @bedside  with interpretor

## 2023-06-08 NOTE — ED Provider Notes (Signed)
-----------------------------------------   6:48 AM on 06/08/2023 -----------------------------------------   Blood pressure 98/61, pulse 67, temperature 98.3 F (36.8 C), temperature source Oral, resp. rate 15, SpO2 96%.  The patient is calm and cooperative at this time.  There have been no acute events since the last update.  Awaiting disposition plan from case management/social work.    Tephanie Escorcia, Layla Maw, DO 06/08/23 [phone]

## 2023-06-08 NOTE — ED Notes (Signed)
Interpretor @bedside , breakfast/lunch and dinner meals ordered with dietary, urinal emptied. Sent secure chat to CM to ensure they can use interpretor to communicate.

## 2023-06-09 NOTE — ED Notes (Signed)
Aspercreme applied to to left knee at patient request.

## 2023-06-09 NOTE — ED Notes (Signed)
Report received from [place], EMT and assumed care of patient at this time. Interpreter notifed this RN patient was requesting something for pain. Patient gone for walk with PT/OT, bed changed while at walk.

## 2023-06-09 NOTE — ED Notes (Signed)
Lunch tray delivered, patient resting quietly with eyes closed at this time. Declined to get up and eat or drink.

## 2023-06-09 NOTE — Progress Notes (Signed)
Mobility Specialist - Progress Note     06/09/23 1128  Mobility  Activity Ambulated with assistance in hallway  Level of Assistance Modified independent, requires aide device or extra time  Assistive Device Front wheel walker  Distance Ambulated (ft) 200 ft  Range of Motion/Exercises Active  RLE Weight Bearing WBAT  Activity Response Tolerated well  Mobility Referral Yes  $Mobility charge 1 Mobility  Mobility Specialist Start Time (ACUTE ONLY) 1100  Mobility Specialist Stop Time (ACUTE ONLY) 1128  Mobility Specialist Time Calculation (min) (ACUTE ONLY) 28 min   Pt resting in bed on RA upon entry. Pt STS and ambulates to hallway to outside area (bench) ModI with RW. Pt endorses knee and arm pain. RN notified. Pt returned to bed and left with needs in reach. Interpreter present during session.   Martin [name] Mobility Specialist 06/09/23, 11:31 AM

## 2023-06-09 NOTE — ED Notes (Signed)
Patient provided with Orange Juice at request. VSS. Patient requests banana, dietary services called. Urinal emptied and blankets straightened.

## 2023-06-09 NOTE — TOC Progression Note (Addendum)
Transition of Care [facility]) - Progression Note    Patient Details  Name: Martin [name] MRN: [id number] Date of Birth: 1955-12-10  Transition of Care [facility]) CM/SW Contact  Kreg Shropshire, RN Phone Number: 06/09/2023, 2:26 PM  Clinical Narrative:     Cm LVM for Ebony Hail [facility] D[facility] health depostiion coordinator [phone] to see if pt has the correct Medicaid to help with placement.  1614-Received call from [facility] deposition coordinator Marylu Lund, She stated that pt has standard medicaid and doesn't qualify for [employer] Production designer, theatre/television/film. His medicaid is managed by a private entity.  Cm called [facility] again and LVM for Lattie Corns [phone]  Expected Discharge Plan:  (TBD) Barriers to Discharge: Continued Medical Work up  Expected Discharge Plan and Services                                               Social Determinants of Health (SDOH) Interventions SDOH Screenings   Tobacco Use: Low Risk  (04/29/2023)    Readmission Risk Interventions     No data to display

## 2023-06-09 NOTE — ED Provider Notes (Signed)
-----------------------------------------   5:46 AM on 06/09/2023 -----------------------------------------   Blood pressure 99/61, pulse 75, temperature 98.4 F (36.9 C), temperature source Oral, resp. rate 14, SpO2 96%.  The patient is calm and cooperative at this time.  There have been no acute events since the last update.  Awaiting disposition plan from Social Work team.   Irean Hong, MD 06/09/23 [phone]

## 2023-06-10 NOTE — Progress Notes (Signed)
Mobility Specialist - Progress Note     06/10/23 1224  Mobility  Activity Ambulated with assistance in hallway  Level of Assistance Modified independent, requires aide device or extra time  Assistive Device Front wheel walker  Distance Ambulated (ft) 80 ft  Range of Motion/Exercises Active  Activity Response Tolerated well  Mobility Referral Yes  $Mobility charge 1 Mobility  Mobility Specialist Start Time (ACUTE ONLY) 1145  Mobility Specialist Stop Time (ACUTE ONLY) 1220  Mobility Specialist Time Calculation (min) (ACUTE ONLY) 35 min   Pt resting in bed on RA upon entry. Pt endorses pain in feet but still agreeable to participate in ambulation ModI with RW. Pt returned to bed and left with needs in reach. Interpreter present at bedside.   Martin [name] Mobility Specialist 06/10/23, 1:30 PM

## 2023-06-10 NOTE — ED Provider Notes (Signed)
-----------------------------------------   6:44 AM on 06/10/2023 -----------------------------------------   Blood pressure 102/63, pulse 62, temperature 97.8 F (36.6 C), temperature source Axillary, resp. rate 16, SpO2 93%.  The patient is calm and cooperative at this time.  There have been no acute events since the last update.  Awaiting disposition plan from Social Work team.   Irean Hong, MD 06/10/23 [phone]

## 2023-06-10 NOTE — ED Notes (Signed)
RN to bedside as he is yelling out. Pt waS assisted with mixing his tuna with the rice. Pt in no acute distress and was eating his meal.

## 2023-06-10 NOTE — ED Notes (Signed)
While this pt was working with mobility specialist, this nurse changed the patients sheets.

## 2023-06-10 NOTE — TOC CM/SW Note (Signed)
CSW spoke with patient at bedside to check If he has reconsidered. Patient decline rehab.

## 2023-06-10 NOTE — ED Notes (Signed)
Taking over care of pt at this time

## 2023-06-10 NOTE — ED Notes (Signed)
Before the pt started to

## 2023-06-10 NOTE — ED Notes (Signed)
Rn provided colostomy care to pt.

## 2023-06-11 MED ORDER — DICLOFENAC SODIUM 25 MG PO TBEC
25.0000 mg | DELAYED_RELEASE_TABLET | Freq: Once | ORAL | Status: AC
  Administered 2023-06-11: 25 mg via ORAL
  Filled 2023-06-11: qty 1

## 2023-06-11 NOTE — ED Notes (Signed)
Pt given a lunch tray. 

## 2023-06-11 NOTE — ED Notes (Signed)
Pt's urinal emptied, and returned to pt.

## 2023-06-11 NOTE — ED Notes (Signed)
Offered the patient the voltaren but he did not want it right now.

## 2023-06-11 NOTE — ED Notes (Signed)
Pt complains of right shoulder pain, and overall body discomfort. Pt communicates to interpreter that he would not like to do physical therapy today, and would rather do it tomorrow. Pt notified via ascom.

## 2023-06-11 NOTE — ED Notes (Signed)
Emptied patients colostomy bag, urinal, and gave medication.

## 2023-06-11 NOTE — ED Notes (Signed)
Pt's ostomy bag cleaned and changed. Pt's breakfast order placed: corn flakes and 2 cartons of milk.

## 2023-06-11 NOTE — ED Notes (Signed)
Pt care taken, pt wants his colostomy bag drained. Now wants his lights off.

## 2023-06-11 NOTE — ED Provider Notes (Signed)
-----------------------------------------   5:48 AM on 06/11/2023 -----------------------------------------   Blood pressure 107/67, pulse 63, temperature 97.9 F (36.6 C), temperature source Oral, resp. rate 18, SpO2 96%.  The patient is calm and cooperative at this time.  There have been no acute events since the last update.  Awaiting disposition plan from Social Work team.   Irean Hong, MD 06/11/23 [phone]

## 2023-06-11 NOTE — ED Notes (Signed)
Pt given a breakfast tray.

## 2023-06-12 MED ORDER — DICLOFENAC SODIUM 25 MG PO TBEC
25.0000 mg | DELAYED_RELEASE_TABLET | Freq: Once | ORAL | Status: AC
  Administered 2023-06-12: 25 mg via ORAL
  Filled 2023-06-12: qty 1

## 2023-06-12 NOTE — ED Notes (Signed)
Pt rounding completed. During my rounding, pt was alert and oriented. I introduced myself as his RN for the night. Pt was asked if he needs anything. Pt denies any gestured needs. When I went to update pt board, pt hand made a hand gesture to exit his room and hand gestured an "okay" sign. No distress observed.

## 2023-06-12 NOTE — ED Notes (Signed)
Patient resting with eyes closed and wrapped in blanket. No signs of distress noted

## 2023-06-12 NOTE — ED Provider Notes (Signed)
Vitals:   06/11/23 1556 06/11/23 1818  BP:  106/64  Pulse:  70  Resp:  19  Temp: 97.7 F (36.5 C) 97.7 F (36.5 C)  SpO2:  98%     Patient is sleeping without distress in the hallway bed.  Awaiting disposition plan from Social Work team.      Sharyn Creamer, MD 06/12/23 [phone]

## 2023-06-12 NOTE — ED Notes (Signed)
Pt care taken, no complaints at this time. 

## 2023-06-12 NOTE — ED Notes (Signed)
Patient resting with eyes closed and lights off.

## 2023-06-13 NOTE — ED Provider Notes (Signed)
-----------------------------------------   5:39 AM on 06/13/2023 -----------------------------------------   Blood pressure 99/63, pulse 70, temperature 97.7 F (36.5 C), resp. rate 16, SpO2 100%.  The patient is calm and cooperative at this time.  There have been no acute events since the last update.  Awaiting disposition plan from [facility] team.   Loleta Rose, MD 06/13/23 [phone]

## 2023-06-13 NOTE — ED Notes (Signed)
Pt refused dinner at this time, says he will have it later. Assisted to empty ostomy of gas. No other needs at this time

## 2023-06-13 NOTE — ED Notes (Addendum)
Pt refused lunch tray after a bite of a chicken nugget. Assisted with urinal

## 2023-06-13 NOTE — ED Notes (Signed)
Pt assisted with breakfast.

## 2023-06-13 NOTE — ED Notes (Signed)
Pt's wafer and colostomy bag was changed. Site around stoma appears intact with no skin breakdown. Pt tolerated it well.

## 2023-06-13 NOTE — ED Notes (Signed)
This RN shaved pt's face. Breakfast and lunch ordered for pt as well.

## 2023-06-13 NOTE — TOC Progression Note (Addendum)
Transition of Care [facility]) - Progression Note    Patient Details  Name: Martin [name] MRN: [id number] Date of Birth: [birth year]  Transition of Care [facility]) CM/SW Contact  Darolyn Rua, [place] Phone Number: 06/13/2023, 10:12 AM  Clinical Narrative:     Update 3:22pm: Oneal Grout [phone] with family care home reports they do accept patients with colostomy bag and is agreeable to review patient's referral as they would be able to assist him with his needs. Clinicals have been faxed to [phone]  Update 2:48 pm: Kelton Pillar reports if patient has needs related to vision loss she can provide referral for deaf or hard of hearing. She requests a recent eye note from no more than a year old- if information can be printed and MD can indicate if patient can only see hand motions or tell when light is on or off. She reports email would be best [email or web address]  CSW spoke with Synetta Fail at [facility] she reports if patient did not have colostomy bag they would strongly consider him however this was a barrier.      CSW lvm with [facility] who is Child psychotherapist for [facility] at [phone] pending call back, inquiring as to assistance/guidance with placement for patient's needs.   CSW has also Counsellor at [email or web address]   Pending response at this time.    Expected Discharge Plan:  (TBD) Barriers to Discharge: Continued Medical Work up  Expected Discharge Plan and Services                                               Social Determinants of Health (SDOH) Interventions SDOH Screenings   Tobacco Use: Low Risk  (04/29/2023)    Readmission Risk Interventions     No data to display

## 2023-06-13 NOTE — ED Notes (Signed)
RN and sign language interpreter in room

## 2023-06-14 NOTE — ED Provider Notes (Signed)
-----------------------------------------   5:23 AM on 06/14/2023 -----------------------------------------   Blood pressure 100/67, pulse 71, temperature 98.2 F (36.8 C), temperature source Oral, resp. rate 18, SpO2 99%.  The patient is calm and cooperative at this time.  There have been no acute events since the last update.  Awaiting disposition plan from [facility] team.   Loleta Rose, MD 06/14/23 [phone]

## 2023-06-14 NOTE — ED Notes (Signed)
Patient sleeping. RR are even and unlabored.

## 2023-06-14 NOTE — TOC Progression Note (Signed)
Transition of Care [facility]) - Progression Note    Patient Details  Name: Martin [name] MRN: [id number] Date of Birth: 09-Jun-1956  Transition of Care [facility]) CM/SW Contact  Darolyn Rua, [place] Phone Number: 06/14/2023, 4:16 PM  Clinical Narrative:     9/4: Left message with Meriam Sprague for an update on referral pending response.    9/3:  Update 3:22pm: Oneal Grout [phone] with family care home reports they do accept patients with colostomy bag and is agreeable to review patient's referral as they would be able to assist him with his needs. Clinicals have been faxed to [phone]   Expected Discharge Plan:  (TBD) Barriers to Discharge: Continued Medical Work up  Expected Discharge Plan and Services                                               Social Determinants of Health (SDOH) Interventions SDOH Screenings   Tobacco Use: Low Risk  (04/29/2023)    Readmission Risk Interventions     No data to display

## 2023-06-14 NOTE — ED Notes (Signed)
ASL interpretor brought patient chic fil A. Food at bedside for when patient is ready to eat

## 2023-06-14 NOTE — ED Notes (Signed)
Pt colostomy burped, PRN eye drops given. Pt asking for scheduled drops. Pt educated that RN could only give PRN at this time. Pt agreed. Pt's urinal emptied at this time.

## 2023-06-14 NOTE — ED Provider Notes (Signed)
Emergency Medicine Observation Re-evaluation Note  Martin [name] is a 67 y.o. male, currently boarding in the emergency department.  No acute events since last update.  Physical Exam  BP 98/64   Pulse 72   Temp 97.8 F (36.6 C) (Oral)   Resp 16   SpO2 93%    ED Course / MDM   No recent lab work available for review. Plan  Current plan is for placement to an appropriate living facility once available.  Social worker is working with the patient to achieve this.    Minna Antis, MD 06/14/23 2055

## 2023-06-15 ENCOUNTER — Emergency Department: Payer: Medicare Other

## 2023-06-15 NOTE — ED Notes (Signed)
Pt's pad and underwear changed. Pt provided with clean socks. Pt c/o pain to left foot, and swelling to right knee. Right knee warmer than left. Anner Crete, MD, made aware. On site interpreter informed pt that MD was aware and the x-rays would be done. No other needs at this time.

## 2023-06-15 NOTE — ED Notes (Signed)
Pt resting comfortably at this time with eyes closed. Respirations even and unlabored. NAD.

## 2023-06-16 NOTE — TOC Progression Note (Signed)
Transition of Care [facility]) - Progression Note    Patient Details  Name: Martin [name] MRN: [id number] Date of Birth: 03/21/1956  Transition of Care [facility]) CM/SW Contact  Darolyn Rua, [place] Phone Number: 06/16/2023, 12:37 PM  Clinical Narrative:     CSW reached out to Tammy with [facility] as they currently have a resident who is deaf and blind. CSW inquired if they can accommodate patient's needs.  Tammy reports they will review referral, she was informed patient will be long term medicaid.  Per previous RNCM notes its indicated that  Lenda Kelp, DeafBlind Services Specialist from [facility] reported they will provide 4 free basic ASL Classes for whichever SNF can accept him.    Expected Discharge Plan:  (TBD) Barriers to Discharge: Continued Medical Work up  Expected Discharge Plan and Services                                               Social Determinants of Health (SDOH) Interventions SDOH Screenings   Tobacco Use: Low Risk  (04/29/2023)    Readmission Risk Interventions     No data to display

## 2023-06-16 NOTE — ED Notes (Signed)
Patient lying in bed with eyes closed at this time. Chest rise and fall noted. Nad noted. Resp even and unlabored. Skin appears pink in color.

## 2023-06-16 NOTE — ED Notes (Addendum)
MD at bedside. 

## 2023-06-16 NOTE — ED Notes (Signed)
Patient request new orange juice and ice at this time. Cups labeled and placed on bedside table. Appears patient has eaten half of sandwhich on dinner tray

## 2023-06-16 NOTE — ED Notes (Signed)
Rn to bedside to administer morning medications. Interpreter to bedside. Pt complaining of knee pain. RN offered PRN pain medication. Pt declined. Pt informed that if he changed his mind he could request it at a later time. No further needs at this time.

## 2023-06-16 NOTE — ED Notes (Signed)
Patient lying in bed at this time with eyes closed. Patient easily arousal with light touch. RN asked patient if he needed anything for pain, but patient denied.  RN to set up meal tray, but patient expressed that he had not eaten today. RN to empty urinal with 140 mL output urine (dark yellow in color). Colostomy bag appeared to have scant amount of fecal matter present. As RN left room, patient pulled blanket up and requested lights off. Resp even and non labored. Skin pwd.mae. nad noted

## 2023-06-16 NOTE — ED Provider Notes (Signed)
-----------------------------------------   5:24 AM on 06/16/2023 -----------------------------------------   Blood pressure 95/65, pulse 75, temperature 98.1 F (36.7 C), temperature source Oral, resp. rate 17, SpO2 95%.  The patient is calm and cooperative at this time.  There have been no acute events since the last update.  Awaiting disposition plan from Social Work team.   Irean Hong, MD 06/16/23 [phone]

## 2023-06-16 NOTE — ED Notes (Signed)
Pt expressed bilateral knee pain to this RN. RN assessed knees at this time. Swollen knees bilaterally. Pt expresses pain with touch. MD made aware.

## 2023-06-17 ENCOUNTER — Emergency Department: Payer: Medicare Other

## 2023-06-17 NOTE — ED Notes (Signed)
Patient lying in bed with eyes closed at this time. Chest rise and fall noted. Nad noted. Resp even and unlabored. Skin appears pink in color.

## 2023-06-17 NOTE — ED Notes (Signed)
Patient calm and cooperative with staff, patient uses head nods and communication board to communicate.

## 2023-06-17 NOTE — ED Notes (Signed)
Pt colostomy bag was changed. Supply personnel supplied ostomy bag. Pt was given bag of chips and apple juice. When this narrator began lowering his bed he waved at me to stop and remove myslef from his room. Pt was informed I was placing in bed in a safe low positon for his safety but still waved for me to leave. Pt was informed to call for help.

## 2023-06-17 NOTE — ED Provider Notes (Signed)
-----------------------------------------   6:17 AM on 06/17/2023 -----------------------------------------   Blood pressure 108/65, pulse 69, temperature 98 F (36.7 C), temperature source Oral, resp. rate 18, SpO2 99%.  The patient is calm and cooperative at this time.  There have been no acute events since the last update.  Awaiting disposition plan from Social Work team.   Irean Hong, MD 06/17/23 [phone]

## 2023-06-18 NOTE — Progress Notes (Signed)
Mobility Specialist - Progress Note     06/18/23 1103  Mobility  Activity Ambulated with assistance in room;Stood at bedside;Dangled on edge of bed  Level of Assistance Contact guard assist, steadying assist  Assistive Device Front wheel walker  Distance Ambulated (ft) 3 ft  Range of Motion/Exercises Active  Activity Response Tolerated well  Mobility Referral Yes  $Mobility charge 1 Mobility  Mobility Specialist Start Time (ACUTE ONLY) 1030  Mobility Specialist Stop Time (ACUTE ONLY) 1100  Mobility Specialist Time Calculation (min) (ACUTE ONLY) 30 min    Pt resting in bed on RA upon entry, Interpreter at bedside. Pt agreeable to participate in hygiene task and ambulation in room despite pain feet. Pt STS x2 and walks 2 steps forward and backward while performing hygiene tasks. Pt returned to bed and left with needs in reach.    Johnathan Hausen Mobility Specialist 06/18/23, 11:14 AM

## 2023-06-18 NOTE — ED Notes (Signed)
Bed bath completed. No open soars noted. Pt got up with mobility specialist. Pt changed clothes and new linen placed on bed after cleaning the bed.

## 2023-06-18 NOTE — ED Notes (Signed)
Mobility specialist at bedside, to help pt up and change. Bed bath wipes provided. Interpreter at bedside as well. Temperature in room increased per request.

## 2023-06-18 NOTE — ED Notes (Addendum)
Sign Language interpreter at bedside. RN called for ensure. Mobility specialist notified that interpreter is here as well.

## 2023-06-18 NOTE — ED Notes (Signed)
Patient's lunch is at bedside, but patient pushed it away. Patient continues to sleep.

## 2023-06-18 NOTE — ED Notes (Signed)
Pt laying in bed. NAD noted. Pt laying in bed with eyes closed, blanket on.

## 2023-06-18 NOTE — TOC Progression Note (Signed)
Transition of Care [facility]) - Progression Note    Patient Details  Name: Mendel Romeo MRN: [id number] Date of Birth: 1956-07-20  Transition [facility] Care [facility]) CM/SW Contact  Colette Ribas, [place] Phone Number: 06/18/2023, 10:22 AM  Clinical Narrative:     CSW spoke with Tammy from [facility], she advised she received the referral on Friday and will take a look at it first thing Monday 9/9.  Expected Discharge Plan:  (TBD) Barriers to Discharge: Continued Medical Work up  Expected Discharge Plan and Services                                               Social Determinants of Health (SDOH) Interventions SDOH Screenings   Tobacco Use: Low Risk  (04/29/2023)    Readmission Risk Interventions     No data to display

## 2023-06-18 NOTE — ED Provider Notes (Signed)
-----------------------------------------   5:15 AM on 06/18/2023 -----------------------------------------   Blood pressure 102/64, pulse 78, temperature 98 F (36.7 C), temperature source Oral, resp. rate 18, SpO2 97%.  The patient is calm and cooperative at this time.  There have been no acute events since the last update.  Awaiting disposition plan from Social Work team.   Irean Hong, MD 06/18/23 [phone]

## 2023-06-19 NOTE — ED Provider Notes (Signed)
-----------------------------------------   7:02 AM on 06/19/2023 -----------------------------------------   Blood pressure 91/64, pulse 64, temperature 98.1 F (36.7 C), temperature source Oral, resp. rate 16, SpO2 96%.  The patient is calm and cooperative at this time.  There have been no acute events since the last update.  Awaiting disposition plan from case management/social work.    Citlally Captain, Layla Maw, DO 06/19/23 [phone]

## 2023-06-19 NOTE — ED Notes (Signed)
Pt offered spaghetti and chicken fingers for dinner but refused. Pt drank one bottle of ensure and ate a bowl of cereal. Denies other needs or food requests at this time.

## 2023-06-19 NOTE — ED Notes (Signed)
EDP made aware of pt's BP 

## 2023-06-19 NOTE — ED Notes (Addendum)
Mobility specialist is at bedside. Interpreter still present.

## 2023-06-19 NOTE — ED Notes (Signed)
Dietary note placed to ensure nurse is made aware when pt's meal tray is delivered.

## 2023-06-19 NOTE — ED Notes (Signed)
Mobility specialist requesting PT consult for pt. MD made aware.

## 2023-06-20 NOTE — ED Notes (Signed)
Patient used urinal. Writer emptied urinal for patient and replaced at bedside.

## 2023-06-20 NOTE — ED Notes (Signed)
PT at bedside.
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
## 2023-06-20 NOTE — ED Notes (Signed)
Pt sleeping at this time. No clinical signs of distress. Respirations even and unlabored.

## 2023-06-20 NOTE — ED Notes (Signed)
Patient ate 0% of Lunch and dinner. Trays were offered to him multiple times as he stated he would get the food later. Patient did drink 2 containers of orange juice. And was assisted with ADLs of changing sheets and emptying urinals.

## 2023-06-20 NOTE — TOC Progression Note (Signed)
Transition of Care [facility]) - Progression Note    Patient Details  Name: Martin [name] MRN: [id number] Date of Birth: [birth year]  [facility]) CM/SW Contact  Margarito Liner, LCSW Phone Number: 06/20/2023, 9:28 AM  Clinical Narrative:   [facility] is unable to offer a bed.  Expected Discharge Plan:  (TBD) Barriers to Discharge: Continued Medical Work up  Expected Discharge Plan and Services                                               Social Determinants of Health (SDOH) Interventions SDOH Screenings   Tobacco Use: Low Risk  (04/29/2023)    Readmission Risk Interventions     No data to display

## 2023-06-21 NOTE — TOC Progression Note (Signed)
Transition of Care [facility]) - Progression Note    Patient Details  Name: Martin [name] MRN: [id number] Date of Birth: 11-Mar-1956  Transition of Care [facility]) CM/SW Contact  Marquita Palms, LCSW Phone Number: 06/21/2023, 3:07 PM  Clinical Narrative:     CSW made phone call to [facility] Mariea Stable @ [phone]. Facility willing to look at patient for potential placement. CSW sent requested information to [place] via email [email or web address] awaiting call back.  Expected Discharge Plan:  (TBD) Barriers to Discharge: Continued Medical Work up  Expected Discharge Plan and Services                                               Social Determinants of Health (SDOH) Interventions SDOH Screenings   Tobacco Use: Low Risk  (04/29/2023)    Readmission Risk Interventions     No data to display

## 2023-06-21 NOTE — ED Provider Notes (Signed)
-----------------------------------------   5:33 AM on 06/21/2023 -----------------------------------------   Blood pressure 94/67, pulse 72, temperature 98.7 F (37.1 C), resp. rate 16, SpO2 93%.  The patient is calm and cooperative at this time.  There have been no acute events since the last update.  Awaiting disposition plan from Social Work team.   Irean Hong, MD 06/21/23 [phone]

## 2023-06-21 NOTE — TOC Progression Note (Signed)
Transition of Care [facility]) - Progression Note    Patient Details  Name: Martin [name] MRN: [id number] Date of Birth: 03-16-1956  [facility]) CM/SW Contact  Margarito Liner, LCSW Phone Number: 06/21/2023, 12:26 PM  Clinical Narrative:  CSW is checking with SNF's within 25 miles of [place], [place]. No answer or voicemail available at [facility]. Left voicemails at [facility] and [facility] and [facility]. No LTC beds at [facility] or [facility].   Expected Discharge Plan:  (TBD) Barriers to Discharge: Continued Medical Work up  Expected Discharge Plan and Services                                               Social Determinants of Health (SDOH) Interventions SDOH Screenings   Tobacco Use: Low Risk  (04/29/2023)    Readmission Risk Interventions     No data to display

## 2023-06-21 NOTE — ED Notes (Signed)
Patient resting comfortably with eyes closed at this time, respirations even and unlabored, NAD

## 2023-06-21 NOTE — ED Notes (Signed)
Pt given a snack and assisted in using urinal. Pt awake/alert and in no distress.

## 2023-06-21 NOTE — ED Notes (Signed)
Pt assessed and offered urinal. Pt declined and has no needs at this time. Room tidied. Tables and bathroom areas sanitized.

## 2023-06-22 NOTE — ED Notes (Signed)
Patient lying in bed at this time. Communication board used to ask if patient needed anything at this. Patient denied needs at this time.  Patient easily arousable. Resp even and unlabored. Skin pwd. Appropriate for ethnicity. NAD noted at this time.

## 2023-06-22 NOTE — ED Notes (Signed)
Patient ostomy bag changed at this time.  Patient request food at this time. Patient given sandwich tray at this time.

## 2023-06-22 NOTE — ED Notes (Signed)
Patient lying in bed at this time with eyes closed. Chest rise and fall noted. Resp even and unlabored. Skin appears pink. Nad noted. Call light within reach. Bed at lowest position and locked.

## 2023-06-22 NOTE — ED Notes (Signed)
Patient appears to have eaten whole sandwich and apple sauce. OJ given to patient, as well.

## 2023-06-22 NOTE — ED Notes (Signed)
Interpreter in ED, patient sleeping. Per interpreter no need in waking patient.

## 2023-06-23 ENCOUNTER — Emergency Department: Payer: Medicare Other

## 2023-06-23 DIAGNOSIS — L600 Ingrowing nail: Secondary | ICD-10-CM | POA: Diagnosis present

## 2023-06-23 DIAGNOSIS — Z781 Physical restraint status: Secondary | ICD-10-CM | POA: Diagnosis not present

## 2023-06-23 DIAGNOSIS — M00861 Arthritis due to other bacteria, right knee: Secondary | ICD-10-CM | POA: Diagnosis not present

## 2023-06-23 DIAGNOSIS — M009 Pyogenic arthritis, unspecified: Secondary | ICD-10-CM | POA: Diagnosis present

## 2023-06-23 DIAGNOSIS — I959 Hypotension, unspecified: Secondary | ICD-10-CM | POA: Diagnosis not present

## 2023-06-23 DIAGNOSIS — R4589 Other symptoms and signs involving emotional state: Secondary | ICD-10-CM | POA: Diagnosis not present

## 2023-06-23 DIAGNOSIS — R1032 Left lower quadrant pain: Secondary | ICD-10-CM | POA: Diagnosis not present

## 2023-06-23 DIAGNOSIS — Z659 Problem related to unspecified psychosocial circumstances: Secondary | ICD-10-CM | POA: Diagnosis not present

## 2023-06-23 DIAGNOSIS — Z933 Colostomy status: Secondary | ICD-10-CM | POA: Diagnosis not present

## 2023-06-23 DIAGNOSIS — Z789 Other specified health status: Secondary | ICD-10-CM | POA: Diagnosis not present

## 2023-06-23 DIAGNOSIS — I9589 Other hypotension: Secondary | ICD-10-CM | POA: Diagnosis not present

## 2023-06-23 DIAGNOSIS — Z681 Body mass index (BMI) 19 or less, adult: Secondary | ICD-10-CM | POA: Diagnosis not present

## 2023-06-23 DIAGNOSIS — N451 Epididymitis: Secondary | ICD-10-CM | POA: Diagnosis not present

## 2023-06-23 DIAGNOSIS — Z91041 Radiographic dye allergy status: Secondary | ICD-10-CM | POA: Diagnosis not present

## 2023-06-23 DIAGNOSIS — Z603 Acculturation difficulty: Secondary | ICD-10-CM | POA: Diagnosis present

## 2023-06-23 DIAGNOSIS — B351 Tinea unguium: Secondary | ICD-10-CM | POA: Diagnosis present

## 2023-06-23 DIAGNOSIS — M25461 Effusion, right knee: Secondary | ICD-10-CM | POA: Diagnosis present

## 2023-06-23 DIAGNOSIS — Z79890 Hormone replacement therapy (postmenopausal): Secondary | ICD-10-CM | POA: Diagnosis not present

## 2023-06-23 DIAGNOSIS — Z515 Encounter for palliative care: Secondary | ICD-10-CM | POA: Diagnosis not present

## 2023-06-23 DIAGNOSIS — K5981 Ogilvie syndrome: Secondary | ICD-10-CM | POA: Diagnosis present

## 2023-06-23 DIAGNOSIS — M1712 Unilateral primary osteoarthritis, left knee: Secondary | ICD-10-CM | POA: Diagnosis present

## 2023-06-23 DIAGNOSIS — F4320 Adjustment disorder, unspecified: Secondary | ICD-10-CM | POA: Diagnosis not present

## 2023-06-23 DIAGNOSIS — K9409 Other complications of colostomy: Secondary | ICD-10-CM | POA: Diagnosis present

## 2023-06-23 DIAGNOSIS — Z5320 Procedure and treatment not carried out because of patient's decision for unspecified reasons: Secondary | ICD-10-CM | POA: Diagnosis present

## 2023-06-23 DIAGNOSIS — H9193 Unspecified hearing loss, bilateral: Secondary | ICD-10-CM | POA: Diagnosis not present

## 2023-06-23 DIAGNOSIS — E559 Vitamin D deficiency, unspecified: Secondary | ICD-10-CM | POA: Diagnosis present

## 2023-06-23 DIAGNOSIS — E43 Unspecified severe protein-calorie malnutrition: Secondary | ICD-10-CM | POA: Diagnosis present

## 2023-06-23 DIAGNOSIS — L603 Nail dystrophy: Secondary | ICD-10-CM | POA: Diagnosis present

## 2023-06-23 DIAGNOSIS — G8929 Other chronic pain: Secondary | ICD-10-CM | POA: Diagnosis present

## 2023-06-23 DIAGNOSIS — D500 Iron deficiency anemia secondary to blood loss (chronic): Secondary | ICD-10-CM | POA: Diagnosis not present

## 2023-06-23 DIAGNOSIS — E039 Hypothyroidism, unspecified: Secondary | ICD-10-CM | POA: Diagnosis present

## 2023-06-23 DIAGNOSIS — G252 Other specified forms of tremor: Secondary | ICD-10-CM | POA: Diagnosis not present

## 2023-06-23 DIAGNOSIS — H548 Legal blindness, as defined in USA: Secondary | ICD-10-CM | POA: Diagnosis present

## 2023-06-23 DIAGNOSIS — R4689 Other symptoms and signs involving appearance and behavior: Secondary | ICD-10-CM | POA: Diagnosis not present

## 2023-06-23 DIAGNOSIS — D509 Iron deficiency anemia, unspecified: Secondary | ICD-10-CM | POA: Diagnosis present

## 2023-06-23 DIAGNOSIS — R451 Restlessness and agitation: Secondary | ICD-10-CM | POA: Diagnosis present

## 2023-06-23 DIAGNOSIS — F4323 Adjustment disorder with mixed anxiety and depressed mood: Secondary | ICD-10-CM | POA: Diagnosis present

## 2023-06-23 DIAGNOSIS — Z008 Encounter for other general examination: Secondary | ICD-10-CM | POA: Diagnosis not present

## 2023-06-23 DIAGNOSIS — E860 Dehydration: Secondary | ICD-10-CM | POA: Diagnosis present

## 2023-06-23 DIAGNOSIS — R54 Age-related physical debility: Secondary | ICD-10-CM | POA: Diagnosis present

## 2023-06-23 LAB — BASIC METABOLIC PANEL
Anion gap: 11 (ref 5–15)
BUN: 32 mg/dL — ABNORMAL HIGH (ref 8–23)
CO2: 29 mmol/L (ref 22–32)
Calcium: 9.8 mg/dL (ref 8.9–10.3)
Chloride: 97 mmol/L — ABNORMAL LOW (ref 98–111)
Creatinine, Ser: 1.16 mg/dL (ref 0.61–1.24)
GFR, Estimated: >60 mL/min (ref >60)
Glucose, Bld: 119 mg/dL — ABNORMAL HIGH (ref 70–99)
Potassium: 3.7 mmol/L (ref 3.5–5.1)
Sodium: 137 mmol/L (ref 135–145)

## 2023-06-23 LAB — SYNOVIAL CELL COUNT + DIFF, W/ CRYSTALS
Crystals, Fluid: NONE SEEN
Eosinophils-Synovial: 0 %
Lymphocytes-Synovial Fld: 2 %
Monocyte-Macrophage-Synovial Fluid: 1 %
Neutrophil, Synovial: 97 %
WBC, Synovial: 41386 /mm3 — ABNORMAL HIGH (ref 0–200)

## 2023-06-23 LAB — CBC WITH DIFFERENTIAL/PLATELET
Abs Immature Granulocytes: 0.03 10*3/uL (ref 0.00–0.07)
Basophils Absolute: 0 10*3/uL (ref 0.0–0.1)
Basophils Relative: 1 %
Eosinophils Absolute: 0.1 10*3/uL (ref 0.0–0.5)
Eosinophils Relative: 2 %
HCT: 27.4 % — ABNORMAL LOW (ref 39.0–52.0)
Hemoglobin: 8.8 g/dL — ABNORMAL LOW (ref 13.0–17.0)
Immature Granulocytes: 1 %
Lymphocytes Relative: 20 %
Lymphs Abs: 1.1 10*3/uL (ref 0.7–4.0)
MCH: 30.7 pg (ref 26.0–34.0)
MCHC: 32.1 g/dL (ref 30.0–36.0)
MCV: 95.5 fL (ref 80.0–100.0)
Monocytes Absolute: 0.4 10*3/uL (ref 0.1–1.0)
Monocytes Relative: 7 %
Neutro Abs: 3.7 10*3/uL (ref 1.7–7.7)
Neutrophils Relative %: 69 %
Platelets: 227 10*3/uL (ref 150–400)
RBC: 2.87 MIL/uL — ABNORMAL LOW (ref 4.22–5.81)
RDW: 14.2 % (ref 11.5–15.5)
WBC: 5.4 10*3/uL (ref 4.0–10.5)
nRBC: 0 % (ref 0.0–0.2)

## 2023-06-23 LAB — URIC ACID: Uric Acid, Serum: 4.3 mg/dL (ref 3.7–8.6)

## 2023-06-23 LAB — SEDIMENTATION RATE: Sed Rate: 127 mm/h — ABNORMAL HIGH (ref 0–20)

## 2023-06-23 MED ORDER — KETOROLAC TROMETHAMINE 15 MG/ML IJ SOLN
15.0000 mg | Freq: Four times a day (QID) | INTRAMUSCULAR | Status: DC | PRN
  Administered 2023-06-24 – 2023-06-27 (×2): 15 mg via INTRAVENOUS
  Filled 2023-06-23 (×2): qty 1

## 2023-06-23 MED ORDER — OXYCODONE-ACETAMINOPHEN 5-325 MG PO TABS
1.0000 | ORAL_TABLET | Freq: Once | ORAL | Status: AC
  Administered 2023-06-23: 1 via ORAL
  Filled 2023-06-23: qty 1

## 2023-06-23 MED ORDER — ACETAMINOPHEN 650 MG RE SUPP: 650.0000 mg | Freq: Four times a day (QID) | RECTAL | Status: DC | PRN

## 2023-06-23 MED ORDER — ONDANSETRON HCL 4 MG PO TABS: 4.0000 mg | ORAL_TABLET | Freq: Four times a day (QID) | ORAL | Status: DC | PRN

## 2023-06-23 MED ORDER — LIDOCAINE HCL (PF) 1 % IJ SOLN
5.0000 mL | Freq: Once | INTRAMUSCULAR | Status: DC
  Filled 2023-06-23: qty 5

## 2023-06-23 MED ORDER — HYDROCODONE-ACETAMINOPHEN 5-325 MG PO TABS: 1.0000 | ORAL_TABLET | ORAL | Status: DC | PRN

## 2023-06-23 MED ORDER — LEVOTHYROXINE SODIUM 50 MCG PO TABS
125.0000 ug | ORAL_TABLET | Freq: Every day | ORAL | Status: DC
  Filled 2023-06-23 (×7): qty 1

## 2023-06-23 MED ORDER — SODIUM CHLORIDE 0.9 % IV SOLN
1.0000 g | INTRAVENOUS | Status: DC
  Administered 2023-06-24: 1 g via INTRAVENOUS
  Filled 2023-06-23 (×2): qty 10

## 2023-06-23 MED ORDER — SODIUM CHLORIDE 0.9 % IV SOLN
2.0000 g | Freq: Once | INTRAVENOUS | Status: AC
  Administered 2023-06-23: 2 g via INTRAVENOUS
  Filled 2023-06-23: qty 12.5

## 2023-06-23 MED ORDER — ONDANSETRON HCL 4 MG/2ML IJ SOLN: 4.0000 mg | Freq: Four times a day (QID) | INTRAMUSCULAR | Status: DC | PRN

## 2023-06-23 MED ORDER — VANCOMYCIN HCL IN DEXTROSE 1-5 GM/200ML-% IV SOLN
1000.0000 mg | Freq: Once | INTRAVENOUS | Status: DC
  Filled 2023-06-23: qty 200

## 2023-06-23 MED ORDER — ACETAMINOPHEN 325 MG PO TABS
650.0000 mg | ORAL_TABLET | Freq: Four times a day (QID) | ORAL | Status: DC | PRN
  Administered 2023-07-04 – 2024-02-05 (×12): 650 mg via ORAL
  Administered 2024-02-06: 325 mg via ORAL
  Administered 2024-02-08 – 2024-02-10 (×4): 650 mg via ORAL
  Filled 2023-06-23 (×21): qty 2

## 2023-06-23 NOTE — ED Notes (Signed)
Patient lying in bed at this time with eyes closed. Chest rise and fall noted. Resp even and unlabored. Skin appears pink. Nad noted. Call light within reach. Bed at lowest position and locked.

## 2023-06-23 NOTE — ED Notes (Signed)
PATIENT OSTOMY CHANGED. STOMA PINK BEEFY AT THIS TIME. NO REDNESS OR SWELLING NOTED ON THE PERISTOMA

## 2023-06-23 NOTE — TOC Progression Note (Signed)
Transition of Care [facility]) - Progression Note    Patient Details  Name: Javarie Sheard MRN: [id number] Date of Birth: 16-Dec-1955  Transition of Care [facility]) CM/SW Contact  Marquita Palms, LCSW Phone Number: 06/23/2023, 8:40 AM  Clinical Narrative:     9/12: CSW received email from Mariea Stable declining patient due to past behaviors and inability to communicate with him.   Expected Discharge Plan:  (TBD) Barriers to Discharge: Continued Medical Work up  Expected Discharge Plan and Services                                               Social Determinants of Health (SDOH) Interventions SDOH Screenings   Tobacco Use: Low Risk  (04/29/2023)    Readmission Risk Interventions     No data to display

## 2023-06-23 NOTE — Assessment & Plan Note (Signed)
Continue current meds

## 2023-06-23 NOTE — ED Notes (Signed)
Ultrasound in room

## 2023-06-23 NOTE — ED Provider Notes (Signed)
Synovial fluid analysis concerning for possible septic joint with elevated WBC and neutrophile predomonance. Discussed with Dr. Hyacinth Meeker with orthopedics who recommended broad spectrum antibiotics and NPO at midnight for possibel OR tomorrow. Had a long discussion with the patient with the use of the hospital interpreter. Initially he was hesitant for any treatment, we did go over the concern for septic joint and worsening infection that could lead to death or loss of limb. He did finally agree and consent to treatment. Discussed with Dr. Para March with the hospitalist service who will evaluate for admission.   Phineas Semen, MD 06/23/23 2019

## 2023-06-23 NOTE — ED Provider Notes (Addendum)
-----------------------------------------   10:55 AM on 06/23/2023 -----------------------------------------   Blood pressure 98/81, pulse 80, temperature 98.3 F (36.8 C), temperature source Oral, resp. rate 18, SpO2 97%.  The patient is calm and cooperative at this time.  Called into the room because patient is having pain in his right testicle and right knee.  On evaluation has significant joint effusion to the right knee.  Mild swelling to the right leg.  Does have tenderness to palpation to the right testicle.  States that he has had multiple rounds of joint aspiration and need for cortisone shots in the past.  Ultrasound of testicles ordered.  X-ray of the right knee ordered.  Plan for arthrocentesis to further evaluate for joint infection versus gout/pseudogout.  .Joint Aspiration/Arthrocentesis  Date/Time: 06/23/2023 10:57 AM  Performed by: Corena Herter, MD Authorized by: Corena Herter, MD   Consent:    Consent obtained:  Verbal   Consent given by:  Patient   Risks, benefits, and alternatives were discussed: yes     Risks discussed:  Bleeding, infection, pain, nerve damage, incomplete drainage and poor cosmetic result   Alternatives discussed:  Delayed treatment Universal protocol:    Procedure explained and questions answered to patient or proxy's satisfaction: yes     Relevant documents present and verified: yes     Test results available: yes     Imaging studies available: yes     Required blood products, implants, devices, and special equipment available: yes     Site/side marked: yes     Patient identity confirmed:  Verbally with patient, arm band and hospital-assigned identification number Location:    Location:  Knee   Knee:  R knee Anesthesia:    Anesthesia method:  Local infiltration   Local anesthetic:  Lidocaine 1% w/o epi Procedure details:    Preparation: Patient was prepped and draped in usual sterile fashion     Needle gauge:  18 G   Ultrasound guidance:  no     Approach:  Lateral   Aspirate characteristics:  Clear   Steroid injected: no   Post-procedure details:    Dressing:  Adhesive bandage   Procedure completion:  Tolerated well, no immediate complications     Awaiting disposition plan from case management/social work.    Corena Herter, MD 06/23/23 1429

## 2023-06-23 NOTE — Progress Notes (Signed)
   06/23/23 1042  Mobility  Activity Turned to left side;Turned to right side  Activity Response Tolerated fair  Mobility Referral Yes  $Mobility charge 1 Mobility   Pt declined further mobility than assistance performing hygiene tasks. Pt turned to left and right side and bridged up with hips in bed.  Pt endorses increased pain both knees and heavy pain in scrotum. RN Notified. Pt left with need in reach and interpreter at bedside.   Johnathan Hausen Mobility Specialist 06/23/23, 12:33 PM

## 2023-06-23 NOTE — Assessment & Plan Note (Addendum)
Patient had a knee washout by orthopedic surgery.  Patient had total of 2 weeks of antibiotics and also completed steroid taper.

## 2023-06-23 NOTE — ED Notes (Signed)
Patient lying in bed at this time with eyes closed. RR symmetric and equal.Skin pink and radial pulse strong at this time. Vitals last completed at 0608 will reassess when patient is awake.
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
## 2023-06-23 NOTE — Assessment & Plan Note (Signed)
Deaf Follow nursing protocol for communication with patient's disabilities Sign language interpreter is needed

## 2023-06-23 NOTE — ED Notes (Signed)
Obtaining consent for right knee arthrocentesis from patient at this time. Sign language interpreter at bedside also to help interpret.

## 2023-06-24 ENCOUNTER — Inpatient Hospital Stay: Payer: Medicare Other | Admitting: Anesthesiology

## 2023-06-24 ENCOUNTER — Encounter
Admission: EM | Disposition: A | Discharge status: Other Acute Inpatient Hospital | Payer: Self-pay | Source: Home / Self Care | Attending: Internal Medicine

## 2023-06-24 DIAGNOSIS — M00861 Arthritis due to other bacteria, right knee: Secondary | ICD-10-CM

## 2023-06-24 DIAGNOSIS — Z933 Colostomy status: Secondary | ICD-10-CM

## 2023-06-24 HISTORY — PX: KNEE ARTHROSCOPY: SHX127

## 2023-06-24 LAB — IRON AND TIBC
Iron: 34 ug/dL — ABNORMAL LOW (ref 45–182)
Saturation Ratios: 9 % — ABNORMAL LOW (ref 17.9–39.5)
TIBC: 360 ug/dL (ref 250–450)
UIBC: 326 ug/dL

## 2023-06-24 LAB — CBC
HCT: 26.9 % — ABNORMAL LOW (ref 39.0–52.0)
Hemoglobin: 8.6 g/dL — ABNORMAL LOW (ref 13.0–17.0)
MCH: 30.5 pg (ref 26.0–34.0)
MCHC: 32 g/dL (ref 30.0–36.0)
MCV: 95.4 fL (ref 80.0–100.0)
Platelets: 201 10*3/uL (ref 150–400)
RBC: 2.82 MIL/uL — ABNORMAL LOW (ref 4.22–5.81)
RDW: 14.2 % (ref 11.5–15.5)
WBC: 4.9 10*3/uL (ref 4.0–10.5)
nRBC: 0 % (ref 0.0–0.2)

## 2023-06-24 LAB — C-REACTIVE PROTEIN: CRP: 4.1 mg/dL — ABNORMAL HIGH (ref <1.0)

## 2023-06-24 LAB — FOLATE: Folate: 24 ng/mL (ref >5.9)

## 2023-06-24 LAB — VITAMIN B12: Vitamin B-12: 575 pg/mL (ref 180–914)

## 2023-06-24 LAB — HIV ANTIBODY (ROUTINE TESTING W REFLEX): HIV Screen 4th Generation wRfx: NONREACTIVE

## 2023-06-24 LAB — PROTIME-INR
INR: 1 (ref 0.8–1.2)
Prothrombin Time: 13.7 s (ref 11.4–15.2)

## 2023-06-24 LAB — VITAMIN D 25 HYDROXY (VIT D DEFICIENCY, FRACTURES): Vit D, 25-Hydroxy: 21.79 ng/mL — ABNORMAL LOW (ref 30–100)

## 2023-06-24 SURGERY — ARTHROSCOPY, KNEE
Anesthesia: General | Site: Knee | Laterality: Right

## 2023-06-24 MED ORDER — OXYCODONE HCL 5 MG PO TABS
5.0000 mg | ORAL_TABLET | Freq: Once | ORAL | Status: AC | PRN
  Administered 2023-06-24: 5 mg via ORAL

## 2023-06-24 MED ORDER — LIDOCAINE-EPINEPHRINE 1 %-1:100000 IJ SOLN
INTRAMUSCULAR | Status: AC
  Filled 2023-06-24: qty 1

## 2023-06-24 MED ORDER — NEOMYCIN-POLYMYXIN B GU 40-200000 IR SOLN
Status: DC | PRN
  Administered 2023-06-24: 24 mL

## 2023-06-24 MED ORDER — MIDAZOLAM HCL 2 MG/2ML IJ SOLN
INTRAMUSCULAR | Status: AC
  Filled 2023-06-24: qty 2

## 2023-06-24 MED ORDER — BUPIVACAINE HCL (PF) 0.5 % IJ SOLN
INTRAMUSCULAR | Status: AC
  Filled 2023-06-24: qty 30

## 2023-06-24 MED ORDER — METOCLOPRAMIDE HCL 5 MG/ML IJ SOLN: 5.0000 mg | Freq: Three times a day (TID) | INTRAMUSCULAR | Status: DC | PRN

## 2023-06-24 MED ORDER — PHENYLEPHRINE 80 MCG/ML (10ML) SYRINGE FOR IV PUSH (FOR BLOOD PRESSURE SUPPORT)
PREFILLED_SYRINGE | INTRAVENOUS | Status: AC
  Filled 2023-06-24: qty 10

## 2023-06-24 MED ORDER — PHENYLEPHRINE 80 MCG/ML (10ML) SYRINGE FOR IV PUSH (FOR BLOOD PRESSURE SUPPORT)
PREFILLED_SYRINGE | INTRAVENOUS | Status: DC | PRN
  Administered 2023-06-24: 80 ug via INTRAVENOUS

## 2023-06-24 MED ORDER — VANCOMYCIN HCL 1500 MG/300ML IV SOLN
1500.0000 mg | Freq: Once | INTRAVENOUS | Status: AC
  Administered 2023-06-24: 1500 mg via INTRAVENOUS
  Filled 2023-06-24: qty 300

## 2023-06-24 MED ORDER — EPHEDRINE SULFATE-NACL 50-0.9 MG/10ML-% IV SOSY
PREFILLED_SYRINGE | INTRAVENOUS | Status: DC | PRN
Start: 2023-06-24 — End: 2023-06-24
  Administered 2023-06-24: 10 mg via INTRAVENOUS
  Administered 2023-06-24: 5 mg via INTRAVENOUS
  Administered 2023-06-24: 10 mg via INTRAVENOUS

## 2023-06-24 MED ORDER — OXYCODONE HCL 5 MG/5ML PO SOLN: 5.0000 mg | Freq: Once | ORAL | Status: AC | PRN

## 2023-06-24 MED ORDER — MIDAZOLAM HCL 2 MG/2ML IJ SOLN
INTRAMUSCULAR | Status: DC | PRN
  Administered 2023-06-24: 2 mg via INTRAVENOUS

## 2023-06-24 MED ORDER — BUPIVACAINE HCL (PF) 0.5 % IJ SOLN
INTRAMUSCULAR | Status: DC | PRN
Start: 2023-06-24 — End: 2023-06-24
  Administered 2023-06-24: 30 mL

## 2023-06-24 MED ORDER — METOCLOPRAMIDE HCL 5 MG PO TABS: 5.0000 mg | ORAL_TABLET | Freq: Three times a day (TID) | ORAL | Status: DC | PRN

## 2023-06-24 MED ORDER — ONDANSETRON HCL 4 MG/2ML IJ SOLN: 4.0000 mg | Freq: Four times a day (QID) | INTRAMUSCULAR | Status: DC | PRN

## 2023-06-24 MED ORDER — OXYCODONE HCL 5 MG PO TABS
ORAL_TABLET | ORAL | Status: AC
  Filled 2023-06-24: qty 1

## 2023-06-24 MED ORDER — SODIUM CHLORIDE 0.9 % IR SOLN
Status: DC | PRN
Start: 2023-06-24 — End: 2023-06-24
  Administered 2023-06-24: 6000 mL

## 2023-06-24 MED ORDER — FENTANYL CITRATE (PF) 100 MCG/2ML IJ SOLN
INTRAMUSCULAR | Status: DC | PRN
  Administered 2023-06-24: 50 ug via INTRAVENOUS
  Administered 2023-06-24 (×2): 25 ug via INTRAVENOUS

## 2023-06-24 MED ORDER — HYDROCODONE-ACETAMINOPHEN 7.5-325 MG PO TABS
1.0000 | ORAL_TABLET | ORAL | Status: DC | PRN
  Administered 2023-06-26: 2 via ORAL
  Administered 2023-07-12: 1 via ORAL
  Filled 2023-06-24: qty 2
  Filled 2023-06-24: qty 1

## 2023-06-24 MED ORDER — ACETAMINOPHEN 325 MG PO TABS: 325.0000 mg | ORAL_TABLET | Freq: Four times a day (QID) | ORAL | Status: DC | PRN

## 2023-06-24 MED ORDER — HYDROCODONE-ACETAMINOPHEN 5-325 MG PO TABS
1.0000 | ORAL_TABLET | ORAL | Status: DC | PRN
  Administered 2023-06-30 – 2023-07-06 (×3): 2 via ORAL
  Filled 2023-06-24 (×4): qty 2

## 2023-06-24 MED ORDER — LIDOCAINE-EPINEPHRINE 1 %-1:100000 IJ SOLN
INTRAMUSCULAR | Status: DC | PRN
Start: 2023-06-24 — End: 2023-06-24
  Administered 2023-06-24: 10 mL

## 2023-06-24 MED ORDER — LIDOCAINE HCL (CARDIAC) PF 100 MG/5ML IV SOSY
PREFILLED_SYRINGE | INTRAVENOUS | Status: DC | PRN
  Administered 2023-06-24: 100 mg via INTRAVENOUS

## 2023-06-24 MED ORDER — ONDANSETRON HCL 4 MG/2ML IJ SOLN
INTRAMUSCULAR | Status: AC
  Filled 2023-06-24: qty 2

## 2023-06-24 MED ORDER — VANCOMYCIN HCL IN DEXTROSE 1-5 GM/200ML-% IV SOLN
1000.0000 mg | INTRAVENOUS | Status: DC
  Administered 2023-06-25 – 2023-06-26 (×2): 1000 mg via INTRAVENOUS
  Filled 2023-06-24 (×4): qty 200

## 2023-06-24 MED ORDER — ONDANSETRON HCL 4 MG/2ML IJ SOLN
INTRAMUSCULAR | Status: DC | PRN
  Administered 2023-06-24: 4 mg via INTRAVENOUS

## 2023-06-24 MED ORDER — PROPOFOL 10 MG/ML IV BOLUS
INTRAVENOUS | Status: DC | PRN
  Administered 2023-06-24: 150 mg via INTRAVENOUS

## 2023-06-24 MED ORDER — MORPHINE SULFATE (PF) 2 MG/ML IV SOLN: 0.5000 mg | INTRAVENOUS | Status: DC | PRN

## 2023-06-24 MED ORDER — FENTANYL CITRATE (PF) 100 MCG/2ML IJ SOLN
INTRAMUSCULAR | Status: AC
  Filled 2023-06-24: qty 2

## 2023-06-24 MED ORDER — FENTANYL CITRATE (PF) 100 MCG/2ML IJ SOLN: 25.0000 ug | INTRAMUSCULAR | Status: DC | PRN

## 2023-06-24 MED ORDER — ONDANSETRON HCL 4 MG PO TABS: 4.0000 mg | ORAL_TABLET | Freq: Four times a day (QID) | ORAL | Status: DC | PRN

## 2023-06-24 MED ORDER — LACTATED RINGERS IV SOLN: INTRAVENOUS | Status: DC | PRN

## 2023-06-24 MED ORDER — EPHEDRINE 5 MG/ML INJ
INTRAVENOUS | Status: AC
  Filled 2023-06-24: qty 5

## 2023-06-24 MED ORDER — LIDOCAINE HCL (PF) 2 % IJ SOLN
INTRAMUSCULAR | Status: AC
  Filled 2023-06-24: qty 5

## 2023-06-24 SURGICAL SUPPLY — 44 items
APL PRP STRL LF DISP 70% ISPRP (MISCELLANEOUS) ×1
BLADE FULL RADIUS 3.5 (BLADE) IMPLANT
BNDG CMPR 5X4 CHSV STRCH STRL (GAUZE/BANDAGES/DRESSINGS) ×1
BNDG CMPR STD VLCR NS LF 5.8X4 (GAUZE/BANDAGES/DRESSINGS) ×1
BNDG CMPR STD VLCR NS LF 5.8X6 (GAUZE/BANDAGES/DRESSINGS) ×1
BNDG COHESIVE 4X5 TAN STRL LF (GAUZE/BANDAGES/DRESSINGS) IMPLANT
BNDG ELASTIC 4X5.8 VLCR NS LF (GAUZE/BANDAGES/DRESSINGS) ×1 IMPLANT
BNDG ELASTIC 6X5.8 VLCR NS LF (GAUZE/BANDAGES/DRESSINGS) ×1 IMPLANT
BNDG ESMARCH 4 X 12 STRL LF (GAUZE/BANDAGES/DRESSINGS) ×1
BNDG ESMARCH 4X12 STRL LF (GAUZE/BANDAGES/DRESSINGS) ×1 IMPLANT
CHLORAPREP W/TINT 26 (MISCELLANEOUS) ×1 IMPLANT
CUFF TOURN SGL QUICK 24 (TOURNIQUET CUFF)
CUFF TOURN SGL QUICK 34 (TOURNIQUET CUFF)
CUFF TRNQT CYL 24X4X16.5-23 (TOURNIQUET CUFF) IMPLANT
CUFF TRNQT CYL 34X4.125X (TOURNIQUET CUFF) IMPLANT
DRAPE ARTHRO LIMB 89X125 STRL (DRAPES) IMPLANT
ELECT REM PT RETURN 9FT ADLT (ELECTROSURGICAL) ×1 IMPLANT
ELECTRODE REM PT RTRN 9FT ADLT (ELECTROSURGICAL) ×1 IMPLANT
GAUZE SPONGE 4X4 12PLY STRL (GAUZE/BANDAGES/DRESSINGS) ×1 IMPLANT
GAUZE XEROFORM 1X8 LF (GAUZE/BANDAGES/DRESSINGS) ×1 IMPLANT
GLOVE BIO SURGEON STRL SZ8 (GLOVE) ×1 IMPLANT
GOWN STRL REUS W/ TWL LRG LVL3 (GOWN DISPOSABLE) ×1 IMPLANT
GOWN STRL REUS W/TWL LRG LVL3 (GOWN DISPOSABLE) ×1
GOWN STRL REUS W/TWL LRG LVL4 (GOWN DISPOSABLE) ×1 IMPLANT
KIT TURNOVER KIT A (KITS) ×1 IMPLANT
MANIFOLD NEPTUNE II (INSTRUMENTS) ×1 IMPLANT
NS IRRIG 1000ML POUR BTL (IV SOLUTION) ×1 IMPLANT
PACK EXTREMITY ARMC (MISCELLANEOUS) ×1 IMPLANT
PAD CAST 3X4 CTTN HI CHSV (CAST SUPPLIES) ×2 IMPLANT
PADDING CAST COTTON 3X4 STRL (CAST SUPPLIES) ×2
PULSAVAC PLUS IRRIG FAN TIP (DISPOSABLE) IMPLANT
STOCKINETTE 48X4 2 PLY STRL (GAUZE/BANDAGES/DRESSINGS) ×1 IMPLANT
STOCKINETTE BIAS CUT 6 980064 (GAUZE/BANDAGES/DRESSINGS) IMPLANT
STOCKINETTE M/LG 89821 (MISCELLANEOUS) IMPLANT
STOCKINETTE STRL 4IN 9604848 (GAUZE/BANDAGES/DRESSINGS) ×2 IMPLANT
SUT ETHILON 3-0 FS-10 30 BLK (SUTURE) ×1 IMPLANT
SUT ETHILON 4 0 P 3 18 (SUTURE) IMPLANT
SUTURE EHLN 3-0 FS-10 30 BLK (SUTURE) IMPLANT
TIP FAN IRRIG PULSAVAC PLUS (DISPOSABLE) IMPLANT
TRAP FLUID SMOKE EVACUATOR (MISCELLANEOUS) ×1 IMPLANT
TUBE SET DOUBLEFLO INFLOW (TUBING) IMPLANT
TUBE SET DOUBLEFLO OUTFLOW (TUBING) IMPLANT
WAND COBLATION FLOW 50 (SURGICAL WAND) IMPLANT
WATER STERILE IRR 500ML POUR (IV SOLUTION) ×1 IMPLANT

## 2023-06-24 NOTE — Progress Notes (Signed)
Pharmacy Antibiotic Note  Martin [name] is a 67 y.o. male admitted on 03/30/2023 with  septic joint .  Pharmacy has been consulted for Vancomycin  dosing.  Plan: Vancomycin 1500 mg IV x 1 given on 9/14 @ 0207. Vancomycin 1 gm IV Q24H ordered to start on 9/15 @ 0200.  AUC = 471.3 Vanc trouch = 10.8   Weight: 60 kg (132 lb 4.4 oz)  Temp (24hrs), Avg:98.1 F (36.7 C), Min:97.9 F (36.6 C), Max:98.3 F (36.8 C)  Recent Labs  Lab 06/23/23 1943  WBC 5.4  CREATININE 1.16    CrCl cannot be calculated (Unknown ideal weight.).    Allergies  Allergen Reactions   Ivp Dye [Iodinated Contrast Media] Other (See Comments)    Pt reports he has seizures.    Antimicrobials this admission:   >>    >>   Dose adjustments this admission:   Microbiology results:  BCx:   UCx:    Sputum:    MRSA PCR:   Thank you for allowing pharmacy to be a part of this patient's care.  Martin [name] 06/24/2023 2:45 AM
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
## 2023-06-24 NOTE — H&P (Signed)
THE PATIENT WAS SEEN PRIOR TO SURGERY TODAY.  HISTORY, ALLERGIES, HOME MEDICATIONS AND OPERATIVE PROCEDURE WERE REVIEWED. RISKS AND BENEFITS OF SURGERY DISCUSSED WITH PATIENT AGAIN.  NO CHANGES FROM INITIAL HISTORY AND PHYSICAL NOTED.

## 2023-06-24 NOTE — Assessment & Plan Note (Signed)
Colostomy status Colostomy care

## 2023-06-24 NOTE — Consult Note (Signed)
ORTHOPAEDIC CONSULTATION  REQUESTING PHYSICIAN: Gillis Santa, MD  Chief Complaint: Right knee pain and swelling  HPI: Martin [name] is a 67 y.o. male who complains of right knee pain and swelling over the last week or 2.  Patient was admitted to the emergency room room at [facility] June 20 20th of 2024.  He had psychiatric issues at that time and was expelled from his care facility.  He has remained in the emergency room at [facility] because of placement problems since then.  He is blind and deaf and has very little visual acuity.  He is able to interpret sign language.  The sign language interpreter indicates that he has had swelling in the right knee for the last couple weeks.  This became more acute yesterday.  The knee was aspirated and fluid sent for culture and evaluation.  No crystals were seen.  White count was 41,000.  Patient has a history of probable septic arthritis of this knee in 2017.  He was treated at [facility] by Dr. Margarita Rana in March 2017 with an arthroscopic washout of the knee.  after that went underwent IV antibiotic and oral antibiotic therapy for an unknown length of time.  He is apparently not had any flareups since then.  He also has a colostomy.  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 Today his right knee is more swollen.  It is painful.  He is slightly warm.  There is not much redness.  Range of motion is painful.  White blood count yesterday was 5400.  Temperature 99.6.  Past Medical History:  Diagnosis Date   Chronic constipation    Deaf    Gout    Hypothyroidism    Legally blind    Ogilvie's syndrome    Pyogenic arthritis of right knee joint [facility])    Past Surgical History:  Procedure Laterality Date   EYE SURGERY Right    "relieved fluid pressure and cleaned it out"   KNEE ARTHROSCOPY Right 12/22/2015   Procedure: ARTHROSCOPY WASHOUT OF SEPTIC RIGHT KNEE;  Surgeon: Sheral Apley, MD;  Location: [facility] OR;  Service: Orthopedics;  Laterality:  Right;   KNEE SURGERY Left 2015/2016   Social History   Socioeconomic History   Marital status: Married    Spouse name: Not on file   Number of children: Not on file   Years of education: Not on file   Highest education level: Not on file  Occupational History   Not on file  Tobacco Use   Smoking status: Never   Smokeless tobacco: Never  Substance and Sexual Activity   Alcohol use: No   Drug use: No   Sexual activity: Yes  Other Topics Concern   Not on file  Social History Narrative   Not on file   Social Determinants of Health   Financial Resource Strain: Not on file  Food Insecurity: Not on file  Transportation Needs: Not on file  Physical Activity: Not on file  Stress: Not on file  Social Connections: Not on file   Family History  Family history unknown: Yes   Allergies  Allergen Reactions   Ivp Dye [Iodinated Contrast Media] Other (See Comments)    Pt reports he has seizures.   Prior to Admission medications   Medication Sig Start Date End Date Taking? Authorizing Provider  acetaminophen (TYLENOL) 325 MG tablet Take 650 mg by mouth every 8 (eight) hours as needed.   Yes [provider]  bimatoprost (LUMIGAN) 0.01 %  SOLN Place 1 drop into both eyes at bedtime. Patient taking differently: Place 1 drop into both eyes in the morning, at noon, and at bedtime. 0900/1300/2100 04/05/17  Yes Noel, Tiffany S, PA-C  clonazePAM (KLONOPIN) 0.5 MG tablet Take 0.5 mg by mouth 2 (two) times daily as needed for anxiety.   Yes [provider]  diclofenac Sodium (VOLTAREN) 1 % GEL Apply 2 g topically every 6 (six) hours as needed. Apply to right shoulder   Yes [provider]  dorzolamide-timolol (COSOPT) 22.3-6.8 MG/ML ophthalmic solution Place 1 drop into both eyes 2 (two) times daily. 04/05/17  Yes Vivianne Master, PA-C  levothyroxine (SYNTHROID) 125 MCG tablet Take 1 tablet (125 mcg total) by mouth daily before breakfast. 06/06/20  Yes Dorcas Carrow, MD   linaclotide Karlene Einstein) 290 MCG CAPS capsule Take 1 capsule (290 mcg total) by mouth daily before breakfast. 12/09/20  Yes Khatri, Hina, PA-C  Menthol, Topical Analgesic, (BIOFREEZE) 4 % GEL Apply 1 Application topically in the morning and at bedtime.   Yes [provider]  Netarsudil Dimesylate (RHOPRESSA) 0.02 % SOLN Place 1 drop into both eyes at bedtime.   Yes [provider]  polyethylene glycol powder (GLYCOLAX/MIRALAX) 17 GM/SCOOP powder Take 17 g by mouth 2 (two) times daily. 06/06/20  Yes Dorcas Carrow, MD  traMADol (ULTRAM) 50 MG tablet Take by mouth every 12 (twelve) hours as needed.   Yes [provider]  diclofenac (VOLTAREN) 75 MG EC tablet Take 75 mg by mouth. Patient not taking: Reported on 03/30/2023    [provider]  GABAPENTIN PO Take by mouth. Patient not taking: Reported on 03/30/2023    [provider]   US SCROTUM W/DOPPLER  Result Date: 06/23/2023 CLINICAL DATA:  Right-sided testicular pain for a day EXAM: SCROTAL ULTRASOUND DOPPLER ULTRASOUND OF THE TESTICLES TECHNIQUE: Complete ultrasound examination of the testicles, epididymis, and other scrotal structures was performed. Color and spectral Doppler ultrasound were also utilized to evaluate blood flow to the testicles. COMPARISON:  Abdomen pelvis CT without contrast 03/31/2023 FINDINGS: Right testicle Measurements: 2.7 x 2.0 x 2.2 cm. Heterogeneous echotexture. Small parenchymal calcification. Left testicle Measurements:  3.1 x 2.0 x 2.5 cm.  Heterogeneous echotexture. Right epididymis:  Normal in size and appearance. Left epididymis: Left-sided epididymal cysts or spermatoceles measuring up to 11 mm Hydrocele:  Moderate hydroceles bilaterally. Varicocele:  None visualized. Pulsed Doppler interrogation of both testes demonstrates low resistance arterial and venous waveforms bilaterally. Globally the flow is less robust than usually seen to the testicles bilaterally. Sonographer states  there was some limitation of the examination due to the patient's clinical condition. IMPRESSION: Small heterogeneous testicles bilaterally. There is flow to each testicle on Doppler but less robust than usually seen. Moderate bilateral hydroceles. Small left-sided epididymal cyst or spermatocele. Electronically Signed   By: Karen Kays M.D.   On: 06/23/2023 13:36    Positive ROS: All other systems have been reviewed and were otherwise negative with the exception of those mentioned in the HPI and as above.  Physical Exam: General: Alert, no acute distress Cardiovascular: No pedal edema Respiratory: No cyanosis, no use of accessory musculature GI: No organomegaly, abdomen is soft and non-tender Skin: No lesions in the area of chief complaint Neurologic: Sensation intact distally Psychiatric: Patient is competent for consent with normal mood and affect Lymphatic: No axillary or cervical lymphadenopathy  MUSCULOSKELETAL: Patient is lying quietly in the bed.  In sign language interpreter is present who appears to understand.  The right knee is quite swollen.  4+ effusion.  Tender to touch.  No significant redness.  Range of motion is painful.  Neurovascular status good distally.  No other orthopedic injuries are noted.  Assessment: Probable septic knee right knee  Plan: I recommended arthroscopic washout of the knee due to the large amount of swelling.  Cultures are pending at this time.  Will plan to proceed later this afternoon with this.    Valinda Hoar, MD [phone]   06/24/2023 11:48 AM

## 2023-06-24 NOTE — Progress Notes (Signed)
   06/24/23 1400  Spiritual Encounters  Type of Visit Initial  Care provided to: Patient  Conversation partners present during encounter Other (comment)  Referral source Nurse (RN/NT/LPN)  Reason for visit Surgical  OnCall Visit Yes   Chaplain responded to pre-surgery request for prayer.

## 2023-06-24 NOTE — Anesthesia Procedure Notes (Signed)
Procedure Name: LMA Insertion Date/Time: 06/24/2023 3:44 PM  Performed by: Omer Jack, CRNAPre-anesthesia Checklist: Patient identified, Patient being monitored, Timeout performed, Emergency Drugs available and Suction available Patient Re-evaluated:Patient Re-evaluated prior to induction Oxygen Delivery Method: Circle system utilized Preoxygenation: Pre-oxygenation with 100% oxygen Induction Type: IV induction Ventilation: Mask ventilation without difficulty LMA: LMA inserted LMA Size: 4.0 Tube type: Oral Number of attempts: 1 Placement Confirmation: positive ETCO2 and breath sounds checked- equal and bilateral Tube secured with: Tape Dental Injury: Teeth and Oropharynx as per pre-operative assessment

## 2023-06-24 NOTE — Anesthesia Preprocedure Evaluation (Signed)
Anesthesia Evaluation  Patient identified by MRN, date of birth, ID band Patient awake    Reviewed: Allergy & Precautions, H&P , NPO status , Patient's Chart, lab work & pertinent test results  History of Anesthesia Complications Negative for: history of anesthetic complications  Airway Mallampati: II  TM Distance: >3 FB Neck ROM: full    Dental no notable dental hx. (+) Dental Advidsory Given   Pulmonary neg pulmonary ROS   Pulmonary exam normal breath sounds clear to auscultation       Cardiovascular negative cardio ROS Normal cardiovascular exam Rhythm:regular Rate:Normal     Neuro/Psych negative neurological ROS     GI/Hepatic negative GI ROS, Neg liver ROS,,,  Endo/Other  Hypothyroidism    Renal/GU negative Renal ROS     Musculoskeletal  (+) Arthritis ,    Abdominal   Peds  Hematology negative hematology ROS (+)   Anesthesia Other Findings Patient is mostly blind and deaf, consent obtained through the use of an interpreter  Denies cardiac or pulmonary history, NPO appropriate  Reproductive/Obstetrics negative OB ROS                             Anesthesia Physical Anesthesia Plan  ASA: 3  Anesthesia Plan: General ETT and General   Post-op Pain Management:    Induction: Intravenous  PONV Risk Score and Plan: 2 and Ondansetron and Treatment may vary due to age or medical condition  Airway Management Planned: LMA  Additional Equipment:   Intra-op Plan:   Post-operative Plan: Extubation in OR  Informed Consent: I have reviewed the patients History and Physical, chart, labs and discussed the procedure including the risks, benefits and alternatives for the proposed anesthesia with the patient or authorized representative who has indicated his/her understanding and acceptance.     Dental Advisory Given and Interpreter used for interveiw  Plan Discussed with:  Anesthesiologist, CRNA and Surgeon  Anesthesia Plan Comments: (Patient consented for risks of anesthesia including but not limited to:  - adverse reactions to medications - damage to eyes, teeth, lips or other oral mucosa - nerve damage due to positioning  - sore throat or hoarseness - Damage to heart, brain, nerves, lungs, other parts of body or loss of life  Patient voiced understanding.)        Anesthesia Quick Evaluation

## 2023-06-24 NOTE — Transfer of Care (Signed)
Immediate Anesthesia Transfer of Care Note  Patient: Martin [name]  Procedure(s) Performed: ARTHROSCOPY KNEE WASHOUT (Right: Knee)  Patient Location: PACU  Anesthesia Type:General  Level of Consciousness: awake, alert , and oriented  Airway & Oxygen Therapy: Patient Spontanous Breathing  Post-op Assessment: Report given to RN and Post -op Vital signs reviewed and stable  Post vital signs: Reviewed and stable  Last Vitals:  Vitals Value Taken Time  BP 110/71 06/24/23 1700  Temp    Pulse 90 06/24/23 1707  Resp 18 06/24/23 1707  SpO2 97 % 06/24/23 1707  Vitals shown include unfiled device data.  Last Pain:  Vitals:   06/23/23 2240  TempSrc:   PainSc: 0-No pain      Patients Stated Pain Goal: 0 (04/20/23 1924)  Complications: No notable events documented.

## 2023-06-24 NOTE — Progress Notes (Signed)
Pt arrived in PACU via bed from OR Amy, the interpreter with pt in PACU, pt c/o min pain in right knee medicated with relief.

## 2023-06-24 NOTE — Op Note (Signed)
06/24/2023  5:09 PM  PATIENT:  Martin [name]  67 y.o. male  PRE-OPERATIVE DIAGNOSIS:  Right Knee infection  POST-OPERATIVE DIAGNOSIS:  Same  PROCEDURE:  ARTHROSCOPY KNEE WASHOUT with irrigation and debridement right  SURGEON:  Takeem Krotzer E Reveca Desmarais MD  ASST:    ANESTHESIA:   General  EBL: Minimal  TOURNIQUET TIME: None  OPERATIVE FINDINGS: 60 cc of turbid fluid with multiple fibrin particles.  It was not thick or creamy like pus.  OPERATIVE PROCEDURE: Patient was brought to the operating room and underwent satisfactory general anesthesia supine position.  Right leg was prepped and draped in sterile fashion.  Inferomedial and inferolateral portal was made adjacent to the patellar tendon and arthroscope inserted inferolaterally.  Working portal was made medially.  The majority of fluid was expressed through a large cannula.  This was collected and sent for culture and sensitivity and Gram stain.  Following that the joint was thoroughly irrigated with 6 L of saline.  A small 3.5 mm motorized shaver was introduced to remove fibrin particles.  There was significant scarring from his previous infection 7 years ago.  There is mild fraying of the articular surfaces of the femur and tibia.  ACL was difficult to visualize but with probing seemed intact.  After finishing the irrigation the stab wounds were closed with 4-0 nylon.  30 cc of half percent Marcaine was placed in the wound.  Dry sterile compression dressing was applied.  Trigger was not used.  Patient was awakened and taken recovery in good condition.  Deeann Saint, MD

## 2023-06-24 NOTE — Plan of Care (Signed)
  Problem: Pain Managment: Goal: General experience of comfort will improve Outcome: Progressing   Problem: Education: Goal: Knowledge of General Education information will improve Description: Including pain rating scale, medication(s)/side effects and non-pharmacologic comfort measures Outcome: Not Progressing   Problem: Activity: Goal: Risk for activity intolerance will decrease Outcome: Not Progressing   Problem: Coping: Goal: Level of anxiety will decrease Outcome: Not Progressing

## 2023-06-24 NOTE — Plan of Care (Signed)

## 2023-06-25 ENCOUNTER — Other Ambulatory Visit: Payer: Self-pay

## 2023-06-25 DIAGNOSIS — M00861 Arthritis due to other bacteria, right knee: Secondary | ICD-10-CM | POA: Diagnosis not present

## 2023-06-25 MED ORDER — POLYSACCHARIDE IRON COMPLEX 150 MG PO CAPS
150.0000 mg | ORAL_CAPSULE | Freq: Every day | ORAL | Status: DC
  Administered 2023-06-26 – 2023-06-30 (×3): 150 mg via ORAL
  Filled 2023-06-25 (×18): qty 1

## 2023-06-25 MED ORDER — SODIUM CHLORIDE 0.9 % IV SOLN
1.0000 g | INTRAVENOUS | Status: DC
  Administered 2023-06-26 – 2023-06-27 (×2): 1 g via INTRAVENOUS
  Filled 2023-06-25 (×4): qty 10

## 2023-06-25 MED ORDER — VITAMIN C 500 MG PO TABS
500.0000 mg | ORAL_TABLET | Freq: Every day | ORAL | Status: DC
  Administered 2023-06-26 – 2023-06-30 (×3): 500 mg via ORAL
  Filled 2023-06-25 (×16): qty 1

## 2023-06-25 MED ORDER — VITAMIN D (ERGOCALCIFEROL) 1.25 MG (50000 UNIT) PO CAPS
50000.0000 [IU] | ORAL_CAPSULE | ORAL | Status: AC
  Administered 2023-07-30 – 2023-08-06 (×2): 50000 [IU] via ORAL
  Filled 2023-06-25 (×9): qty 1

## 2023-06-25 NOTE — Progress Notes (Signed)
PT Cancellation Note  Patient Details Name: Martin [name] MRN: [id number] DOB: 1956-01-29   Cancelled Treatment:    Reason Eval/Treat Not Completed: Patient declined, no reason specified. Pt's chart reviewed prior to entry. PT attempts made to see pt and educate on new WB precautions and educate on gait mechanics with appropriate AD. ASL interpreter, Richard present attempting to communicate with pt. Pt does not make any efforts to respond to interpreter at this time despite multiple efforts. Unfortunately interpreter only available until 10 a.m. today so further attempts deferred until tomorrow as able.    [name]. Fairly IV, PT, DPT Physical Therapist- [place]  [facility]  06/25/2023, 9:15 AM
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
## 2023-06-25 NOTE — Progress Notes (Addendum)
Patient has refused multiple vital attempts and refused lab work being drawn. Patient refused by hollering and pushing away staff with both hands aggressively. Did not want to give prn medications for agitation in case it  masked any post op symptoms. Was able to obtain blood pressure and heart rate and start IV medication and give pain medication and eye medication earlier in the shift. Spoke to on call sericves to see if any on call person could help come relieve patient's agitation and anxiety. Stated someone would come in the AM. Will continue to monitor and provide care in a safe way for both patient and nurse. We have been using the white board to communicate as instructed by dayshift treatment team. Bed in lowest position, side rails up X2, all belongings still in place next to patient as they were placed by patient upon assuming care of this patient, all lights on to help patient with limited vision and call bell within reach.
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
## 2023-06-25 NOTE — Progress Notes (Signed)
Patient utilizing white board to communicate meal requests and needs at this time. RN attempted to administer medications and patient wrote "I said no". Patient does not demonstrate agitation except when approached about particular care. Allowed RN to burp ostomy bag and reposition his legs. Becomes very agitated when RN attempts to access IV and pushes hand away. Educated patient via whiteboard again on the importance of medication and antibiotic therapy. RN offered multiple opportunities to receive medication and patient repeatedly wrote denials.
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
## 2023-06-25 NOTE — Progress Notes (Signed)
Subjective: 1 Day Post-Op Procedure(s) (LRB): ARTHROSCOPY KNEE  DEBRIDEMENT AND WASHOUT (Right) Patient appears to be lying lying quietly in bed.  Interpreter is not available at this time of the day.  His dressing is currently dry.  Neurovascular status good distally.  I am unable to continue to make communicate with him otherwise.  His lab work is pending.  The fluid yesterday was very turbid but runny and not thickened.  There is a lot of fibrin product in it.  This has been sent for cultures.  Cell count showed only a few white cells.  I have ordered a rheumatoid factor ANA and ASO titer on him.  Patient reports pain as  unclear. .  Objective:   VITALS:   Vitals:   06/24/23 1835 06/25/23 0200  BP: 103/68 98/65  Pulse: 71 80  Resp: 16 16  SpO2: 95%     Neurologically intact Incision: dressing C/D/I  LABS Recent Labs    06/23/23 1943 06/24/23 1206  HGB 8.8* 8.6*  HCT 27.4* 26.9*  WBC 5.4 4.9  PLT 227 201    Recent Labs    06/23/23 1943  NA 137  K 3.7  BUN 32*  CREATININE 1.16  GLUCOSE 119*    Recent Labs    06/24/23 1206  INR 1.0     Assessment/Plan: 1 Day Post-Op Procedure(s) (LRB): ARTHROSCOPY KNEE  DEBRIDEMENT AND WASHOUT (Right)   Advance diet Up with therapy

## 2023-06-25 NOTE — Progress Notes (Signed)
RN notified team at 7:26 am of estimated time for in-person translator. Requested attempts for rounds be made during this time if possible.

## 2023-06-25 NOTE — Plan of Care (Signed)
  Problem: Clinical Measurements: Goal: Ability to maintain clinical measurements within normal limits will improve Outcome: Progressing   Problem: Clinical Measurements: Goal: Diagnostic test results will improve Outcome: Progressing   Problem: Clinical Measurements: Goal: Cardiovascular complication will be avoided Outcome: Progressing   Problem: Pain Managment: Goal: General experience of comfort will improve Outcome: Progressing   Problem: Safety: Goal: Ability to remain free from injury will improve Outcome: Progressing   Problem: Education: Goal: Knowledge of General Education information will improve Description: Including pain rating scale, medication(s)/side effects and non-pharmacologic comfort measures Outcome: Not Progressing   Problem: Health Behavior/Discharge Planning: Goal: Ability to manage health-related needs will improve Outcome: Not Progressing   Problem: Clinical Measurements: Goal: Will remain free from infection Outcome: Not Progressing   Problem: Activity: Goal: Risk for activity intolerance will decrease Outcome: Not Progressing   Problem: Coping: Goal: Level of anxiety will decrease Outcome: Not Progressing   Problem: Skin Integrity: Goal: Risk for impaired skin integrity will decrease Outcome: Not Progressing

## 2023-06-26 ENCOUNTER — Encounter: Payer: Self-pay | Admitting: Specialist

## 2023-06-26 DIAGNOSIS — M00861 Arthritis due to other bacteria, right knee: Secondary | ICD-10-CM | POA: Diagnosis not present

## 2023-06-26 LAB — CBC WITH DIFFERENTIAL/PLATELET
Abs Immature Granulocytes: 0.01 10*3/uL (ref 0.00–0.07)
Basophils Absolute: 0 10*3/uL (ref 0.0–0.1)
Basophils Relative: 1 %
Eosinophils Absolute: 0.1 10*3/uL (ref 0.0–0.5)
Eosinophils Relative: 2 %
HCT: 25.1 % — ABNORMAL LOW (ref 39.0–52.0)
Hemoglobin: 8.1 g/dL — ABNORMAL LOW (ref 13.0–17.0)
Immature Granulocytes: 0 %
Lymphocytes Relative: 14 %
Lymphs Abs: 0.8 10*3/uL (ref 0.7–4.0)
MCH: 30.6 pg (ref 26.0–34.0)
MCHC: 32.3 g/dL (ref 30.0–36.0)
MCV: 94.7 fL (ref 80.0–100.0)
Monocytes Absolute: 0.4 10*3/uL (ref 0.1–1.0)
Monocytes Relative: 7 %
Neutro Abs: 4.3 10*3/uL (ref 1.7–7.7)
Neutrophils Relative %: 76 %
Platelets: 183 10*3/uL (ref 150–400)
RBC: 2.65 MIL/uL — ABNORMAL LOW (ref 4.22–5.81)
RDW: 14.5 % (ref 11.5–15.5)
WBC: 5.5 10*3/uL (ref 4.0–10.5)
nRBC: 0 % (ref 0.0–0.2)

## 2023-06-26 LAB — COMPREHENSIVE METABOLIC PANEL
ALT: 20 U/L (ref 0–44)
AST: 31 U/L (ref 15–41)
Albumin: 3.5 g/dL (ref 3.5–5.0)
Alkaline Phosphatase: 48 U/L (ref 38–126)
Anion gap: 8 (ref 5–15)
BUN: 27 mg/dL — ABNORMAL HIGH (ref 8–23)
CO2: 27 mmol/L (ref 22–32)
Calcium: 9 mg/dL (ref 8.9–10.3)
Chloride: 100 mmol/L (ref 98–111)
Creatinine, Ser: 0.98 mg/dL (ref 0.61–1.24)
GFR, Estimated: >60 mL/min (ref >60)
Glucose, Bld: 90 mg/dL (ref 70–99)
Potassium: 4.3 mmol/L (ref 3.5–5.1)
Sodium: 135 mmol/L (ref 135–145)
Total Bilirubin: 0.5 mg/dL (ref 0.3–1.2)
Total Protein: 7.1 g/dL (ref 6.5–8.1)

## 2023-06-26 LAB — PHOSPHORUS: Phosphorus: 2.3 mg/dL — ABNORMAL LOW (ref 2.5–4.6)

## 2023-06-26 LAB — MAGNESIUM: Magnesium: 2 mg/dL (ref 1.7–2.4)

## 2023-06-26 MED ORDER — HALOPERIDOL LACTATE 5 MG/ML IJ SOLN
2.0000 mg | Freq: Once | INTRAMUSCULAR | Status: DC
  Filled 2023-06-26: qty 1

## 2023-06-26 MED ORDER — MIDODRINE HCL 5 MG PO TABS
10.0000 mg | ORAL_TABLET | Freq: Three times a day (TID) | ORAL | Status: DC
  Administered 2023-06-26 – 2023-08-06 (×19): 10 mg via ORAL
  Filled 2023-06-26 (×44): qty 2

## 2023-06-26 NOTE — TOC Progression Note (Signed)
Transition of Care [facility]) - Progression Note    Patient Details  Name: Martin [name] MRN: [id number] Date of Birth: 1955-12-12  [facility] CM/SW Contact  Marlowe Sax, RN Phone Number: 06/26/2023, 3:57 PM  Clinical Narrative:    No bed offers at this time, Resent the bed search thru out the entire Hub The patient is legally lind and deaf and needs a sign language interpreter   Expected Discharge Plan:  (TBD) Barriers to Discharge: Continued Medical Work up  Expected Discharge Plan and Services                                               Social Determinants of Health (SDOH) Interventions SDOH Screenings   Tobacco Use: Low Risk  (04/29/2023)    Readmission Risk Interventions     No data to display
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
## 2023-06-26 NOTE — Progress Notes (Signed)
OT Cancellation Note  Patient Details Name: Martin [name] MRN: [id number] DOB: 1956-08-03   Cancelled Treatment:    Reason Eval/Treat Not Completed: Pain limiting ability to participate;Patient declined, no reason specified. Chart reviewed, ASL interp present. Patient refused all mobility attempts, citing pain and recent surgery. Educated on importance of mobility, pt refused this date. Will reattempt as pt willing.  Glenard Haring, OTS    Dashauna Heymann 06/26/2023, 9:50 AM

## 2023-06-26 NOTE — Plan of Care (Signed)

## 2023-06-26 NOTE — Anesthesia Postprocedure Evaluation (Signed)
Anesthesia Post Note  Patient: Martin [name]  Procedure(s) Performed: ARTHROSCOPY KNEE  DEBRIDEMENT AND WASHOUT (Right: Knee)  Patient location during evaluation: PACU Anesthesia Type: General Level of consciousness: awake and alert Pain management: pain level controlled Vital Signs Assessment: post-procedure vital signs reviewed and stable Respiratory status: spontaneous breathing, nonlabored ventilation, respiratory function stable and patient connected to nasal cannula oxygen Cardiovascular status: blood pressure returned to baseline and stable Postop Assessment: no apparent nausea or vomiting Anesthetic complications: no   No notable events documented.   Last Vitals:  Vitals:   06/26/23 0838 06/26/23 0840  BP: (!) 88/58 (!) 89/62  Pulse: 78 77  Resp: 14 14  Temp: 36.9 C   SpO2: 100% 98%    Last Pain:  Vitals:   06/24/23 1835  TempSrc:   PainSc: 3                  Stephanie Coup

## 2023-06-26 NOTE — Progress Notes (Signed)
PT Cancellation Note  Patient Details Name: Martin [name] MRN: [id number] DOB: [birth year]   Cancelled Treatment:    Reason Eval/Treat Not Completed: Patient declined, no reason specified Chart reviewed. ASL interpreter present. Patient refusing therapy at this time 2/2 pain despite encouragement and suggestion of pain meds prior to mobility. Educated patient on benefits of mobility and pain will continue to maintain with immobility, patient declined. PT will re-attempt at later date/time when interpreter present.   Maylon Peppers, PT, DPT Physical Therapist - [facility]  [facility]     Elery Cadenhead A Cheryl Stabenow 06/26/2023, 9:46 AM

## 2023-06-26 NOTE — Progress Notes (Signed)
Subjective: 2 Days Post-Op Procedure(s) (LRB): ARTHROSCOPY KNEE  DEBRIDEMENT AND WASHOUT (Right) Patient is stable.  He is combative and has refused all blood work last 2 days.  He is also not had antibiotics on a routine basis due to his noncompliance.  He remains afebrile.  Patient reports pain as mild.  Objective:   VITALS:   Vitals:   06/26/23 0838 06/26/23 0840  BP: (!) 88/58 (!) 89/62  Pulse: 78 77  Resp: 14 14  Temp: 98.4 F (36.9 C)   SpO2: 100% 98%    Neurologically intact Dorsiflexion/Plantar flexion intact Incision: dressing C/D/I  LABS Recent Labs    06/23/23 1943 06/24/23 1206  HGB 8.8* 8.6*  HCT 27.4* 26.9*  WBC 5.4 4.9  PLT 227 201    Recent Labs    06/23/23 1943 06/26/23 0950  NA 137 135  K 3.7 4.3  BUN 32* 27*  CREATININE 1.16 0.98  GLUCOSE 119* 90    Recent Labs    06/24/23 1206  INR 1.0     Assessment/Plan: 2 Days Post-Op Procedure(s) (LRB): ARTHROSCOPY KNEE  DEBRIDEMENT AND WASHOUT (Right)   Advance diet Up with therapy Continue IV antibiotics. Try to get lab work done.  His rheumatoid factor and ANA have still not been drawn.  He has not had a CBC in 2 days.

## 2023-06-26 NOTE — Progress Notes (Signed)
Patient refused medication and care today. Patient stated via interpreter that he is scared and would like to go back to his old room in the ED. He also requested his levothyroxine then refused stating he doesn't believe the pharmacy cannot prescribe 1 pill with the full dosage of 125 mg he was offered 3 pills (1*25mg  and 2*50mg ). He stated via the interpreter that in the past he was given medication without being told what they were. He also likes to be touched gently on the shoulder when communicating.

## 2023-06-27 DIAGNOSIS — M00861 Arthritis due to other bacteria, right knee: Secondary | ICD-10-CM | POA: Diagnosis not present

## 2023-06-27 DIAGNOSIS — Z933 Colostomy status: Secondary | ICD-10-CM | POA: Diagnosis not present

## 2023-06-27 DIAGNOSIS — H548 Legal blindness, as defined in USA: Secondary | ICD-10-CM

## 2023-06-27 DIAGNOSIS — M25461 Effusion, right knee: Secondary | ICD-10-CM | POA: Diagnosis not present

## 2023-06-27 DIAGNOSIS — K5981 Ogilvie syndrome: Secondary | ICD-10-CM

## 2023-06-27 DIAGNOSIS — H9193 Unspecified hearing loss, bilateral: Secondary | ICD-10-CM

## 2023-06-27 DIAGNOSIS — F4323 Adjustment disorder with mixed anxiety and depressed mood: Secondary | ICD-10-CM | POA: Diagnosis not present

## 2023-06-27 LAB — COMPREHENSIVE METABOLIC PANEL
ALT: 25 U/L (ref 0–44)
AST: 38 U/L (ref 15–41)
Albumin: 3.8 g/dL (ref 3.5–5.0)
Alkaline Phosphatase: 53 U/L (ref 38–126)
Anion gap: 9 (ref 5–15)
BUN: 24 mg/dL — ABNORMAL HIGH (ref 8–23)
CO2: 29 mmol/L (ref 22–32)
Calcium: 9.8 mg/dL (ref 8.9–10.3)
Chloride: 98 mmol/L (ref 98–111)
Creatinine, Ser: 1.02 mg/dL (ref 0.61–1.24)
GFR, Estimated: >60 mL/min (ref >60)
Glucose, Bld: 125 mg/dL — ABNORMAL HIGH (ref 70–99)
Potassium: 4.1 mmol/L (ref 3.5–5.1)
Sodium: 136 mmol/L (ref 135–145)
Total Bilirubin: 0.4 mg/dL (ref 0.3–1.2)
Total Protein: 8 g/dL (ref 6.5–8.1)

## 2023-06-27 LAB — BODY FLUID CULTURE W GRAM STAIN: Culture: NO GROWTH

## 2023-06-27 LAB — CBC WITH DIFFERENTIAL/PLATELET
Abs Immature Granulocytes: 0.02 10*3/uL (ref 0.00–0.07)
Basophils Absolute: 0 10*3/uL (ref 0.0–0.1)
Basophils Relative: 1 %
Eosinophils Absolute: 0.1 10*3/uL (ref 0.0–0.5)
Eosinophils Relative: 2 %
HCT: 28.2 % — ABNORMAL LOW (ref 39.0–52.0)
Hemoglobin: 9 g/dL — ABNORMAL LOW (ref 13.0–17.0)
Immature Granulocytes: 0 %
Lymphocytes Relative: 17 %
Lymphs Abs: 0.9 10*3/uL (ref 0.7–4.0)
MCH: 30.6 pg (ref 26.0–34.0)
MCHC: 31.9 g/dL (ref 30.0–36.0)
MCV: 95.9 fL (ref 80.0–100.0)
Monocytes Absolute: 0.3 10*3/uL (ref 0.1–1.0)
Monocytes Relative: 7 %
Neutro Abs: 3.6 10*3/uL (ref 1.7–7.7)
Neutrophils Relative %: 73 %
Platelets: 181 10*3/uL (ref 150–400)
RBC: 2.94 MIL/uL — ABNORMAL LOW (ref 4.22–5.81)
RDW: 14.2 % (ref 11.5–15.5)
WBC: 4.9 10*3/uL (ref 4.0–10.5)
nRBC: 0 % (ref 0.0–0.2)

## 2023-06-27 LAB — PHOSPHORUS: Phosphorus: 2.4 mg/dL — ABNORMAL LOW (ref 2.5–4.6)

## 2023-06-27 LAB — MAGNESIUM: Magnesium: 1.9 mg/dL (ref 1.7–2.4)

## 2023-06-27 NOTE — Plan of Care (Signed)
Problem: Nutrition: Goal: Adequate nutrition will be maintained Outcome: Progressing   Problem: Coping: Goal: Level of anxiety will decrease Outcome: Progressing   Problem: Pain Managment: Goal: General experience of comfort will improve Outcome: Progressing
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
## 2023-06-27 NOTE — Progress Notes (Signed)
Subjective: 3 Days Post-Op Procedure(s) (LRB): ARTHROSCOPY KNEE  DEBRIDEMENT AND WASHOUT (Right) Patient is a little awake and alert.  I am unable to communicate it with him verbally. His dressings were changed.  He has some recurrent soft effusion.  There is no redness or cellulitis.  His wounds are benign.  Neurovascular status good distally. White blood count remains normal at 4900. He has remained afebrile. Dr. Noralee Space from infectious disease is to see the patient. Cultures are negativ to date from surgery and from preoperatively as well as blood cultures. This may well be a rheumatologic issue.  We have been unable to get his blood drawn to run all the tests however.  Patient reports pain as mild.  Objective:   VITALS:   Vitals:   06/26/23 1539 06/26/23 2352  BP: (!) 96/59 (!) 97/57  Pulse: 64 (!) 59  Resp: 14 16  Temp: 98.2 F (36.8 C) 97.8 F (36.6 C)  SpO2: 99% 97%    Neurologically intact Incision: dressing C/D/I  LABS Recent Labs    06/26/23 2122 06/27/23 1240  HGB 8.1* 9.0*  HCT 25.1* 28.2*  WBC 5.5 4.9  PLT 183 181    Recent Labs    06/26/23 0950 06/27/23 1240  NA 135 136  K 4.3 4.1  BUN 27* 24*  CREATININE 0.98 1.02  GLUCOSE 90 125*    No results for input(s): "LABPT", "INR" in the last 72 hours.   Assessment/Plan: 3 Days Post-Op Procedure(s) (LRB): ARTHROSCOPY KNEE  DEBRIDEMENT AND WASHOUT (Right)   Up with therapy Continue medical evaluation and workup.

## 2023-06-27 NOTE — Progress Notes (Signed)
OT Cancellation Note  Patient Details Name: Martin [name] MRN: [id number] DOB: [birth year]   Cancelled Treatment:    Reason Eval/Treat Not Completed: Patient declined, no reason specified. OT arrived to room with PT for skilled evaluation. Interpreter present. Patient declined mobility/evaluations despite encouragement. He reports continued knee pain and he asked not to be touched or have his belongings touched. OT will re-attempt as time allows.   Jackquline Denmark, MS, OTR/L , CBIS ascom [phone]  06/27/23, 1:38 PM

## 2023-06-27 NOTE — Plan of Care (Signed)

## 2023-06-27 NOTE — Progress Notes (Signed)
PT Cancellation Note  Patient Details Name: Martin [name] MRN: [id number] DOB: Apr 01, 1956   Cancelled Treatment:    Reason Eval/Treat Not Completed: Patient declined, no reason specified (Interpreter present. Patient declined mobility despite encouragement. He reports continued knee pain and he asked not to be touched or have his belongings touched. PT is willing to assist with mobility when patient feels he can participate.)  Donna Bernard, PT, MPT  Ina Homes 06/27/2023, 12:50 PM

## 2023-06-28 DIAGNOSIS — M00861 Arthritis due to other bacteria, right knee: Secondary | ICD-10-CM | POA: Diagnosis not present

## 2023-06-28 DIAGNOSIS — F4323 Adjustment disorder with mixed anxiety and depressed mood: Secondary | ICD-10-CM | POA: Diagnosis not present

## 2023-06-28 DIAGNOSIS — M25461 Effusion, right knee: Secondary | ICD-10-CM | POA: Diagnosis not present

## 2023-06-28 DIAGNOSIS — H548 Legal blindness, as defined in USA: Secondary | ICD-10-CM | POA: Diagnosis not present

## 2023-06-28 DIAGNOSIS — Z933 Colostomy status: Secondary | ICD-10-CM | POA: Diagnosis not present

## 2023-06-28 LAB — CULTURE, BLOOD (ROUTINE X 2)
Culture: NO GROWTH
Culture: NO GROWTH
Special Requests: ADEQUATE

## 2023-06-28 MED ORDER — SODIUM CHLORIDE 0.9 % IV SOLN
2.0000 g | INTRAVENOUS | Status: DC
  Administered 2023-06-29: 2 g via INTRAVENOUS
  Filled 2023-06-28 (×2): qty 20

## 2023-06-28 NOTE — TOC Progression Note (Signed)
Transition of Care [facility]) - Progression Note    Patient Details  Name: Prithvi Merrifield MRN: [id number] Date of Birth: [birth year]  Transition of [facility] CM/SW Contact  Marlowe Sax, RN Phone Number: 06/28/2023, 1:27 PM  Clinical Narrative:    [facility] declined accepting the patient for their multiple facilities due to behaviors, at this time there are no bed offers   Expected Discharge Plan:  (TBD) Barriers to Discharge: Continued Medical Work up  Expected Discharge Plan and Services                                               Social Determinants of Health (SDOH) Interventions SDOH Screenings   Tobacco Use: Low Risk  (04/29/2023)    Readmission Risk Interventions     No data to display
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
## 2023-06-28 NOTE — Plan of Care (Signed)
Problem: Nutrition: Goal: Adequate nutrition will be maintained Outcome: Progressing   Problem: Elimination: Goal: Will not experience complications related to bowel motility Outcome: Progressing Goal: Will not experience complications related to urinary retention Outcome: Progressing   Problem: Safety: Goal: Ability to remain free from injury will improve Outcome: Progressing

## 2023-06-28 NOTE — Progress Notes (Signed)
Patient approached with interpreter in the room. Discussed with patient via interpreter how to approach him to avoid startling him. Patient refuses IV antibiotics and medications. Education provided regarding need for antibiotics, patient still refuses. Agreeable for eyedrops. Patient states he just wants to sleep now.  Interpreter will return in the afternoon to reattempt administration of IV abx. Appreciate interpreter assistance.

## 2023-06-28 NOTE — Progress Notes (Signed)
PT Cancellation Note  Patient Details Name: Martin [name] MRN: [id number] DOB: 07/08/1956   Cancelled Treatment:    Reason Eval/Treat Not Completed: Patient declined, no reason specified Interpreter present. Patient continues to refuse therapy despite education and encouragement. Stated very clearly that he was not interested in moving and has no plans to move. Educated patient on refusal policy with this being patient's 4th refusal for PT and that PT will be signing off, patient stated he did not care. PT will sign off at this time after 4 consecutive refusals. Please re-consult if patient truly becomes agreeable to mobility.   Maylon Peppers, PT, DPT Physical Therapist - [place]  [facility]    Tj Kitchings A Agatha Duplechain 06/28/2023, 9:59 AM

## 2023-06-28 NOTE — Progress Notes (Signed)
Date of Admission:  03/30/2023     ID: Martin [name] is a 67 y.o. male  Principal Problem:   Septic joint of right knee joint (HCC) Active Problems:   Legally blind   Deaf   Adjustment disorder with mixed anxiety and depressed mood   Ogilvie's syndrome   Colostomy status (HCC)    Subjective: I wrote order for and patient responded by hand signs  Medications:   vitamin C  500 mg Oral Daily   dorzolamide  1 drop Both Eyes BID   feeding supplement  237 mL Oral BID BM   haloperidol lactate  2 mg Intravenous Once   iron polysaccharides  150 mg Oral Daily   latanoprost  1 drop Both Eyes QHS   levothyroxine  125 mcg Oral Q0600   lidocaine (PF)  5 mL Other Once   midodrine  10 mg Oral TID WC   polyethylene glycol  17 g Oral Daily   Vitamin D (Ergocalciferol)  50,000 Units Oral Q7 days    Objective: Vital signs in last 24 hours: Patient Vitals for the past 24 hrs:  BP Temp Temp src Pulse Resp SpO2  06/27/23 1620 110/64 97.9 F (36.6 C) Oral 68 18 93 %      PHYSICAL EXAM:  General: Alert, , no distress, appears stated age.  Lungs: Clear to auscultation bilaterally. No Wheezing or Rhonchi. No rales. Heart: Regular rate and rhythm, no murmur, rub or gallop. Abdomen: Soft, non-tender,not distended. Bowel sounds normal. No masses Extremities: Right leg surgical dressing Right leg knee scar  skin: No rashes or lesions. Or bruising Lymph: Cervical, supraclavicular normal. Neurologic: Grossly non-focal  Lab Results    Latest Ref Rng & Units 06/27/2023   12:40 PM 06/26/2023    9:22 PM 06/24/2023   12:06 PM  CBC  WBC 4.0 - 10.5 K/uL 4.9  5.5  4.9   Hemoglobin 13.0 - 17.0 g/dL 9.0  8.1  8.6   Hematocrit 39.0 - 52.0 % 28.2  25.1  26.9   Platelets 150 - 400 K/uL 181  183  201        Latest Ref Rng & Units 06/27/2023   12:40 PM 06/26/2023    9:50 AM 06/23/2023    7:43 PM  CMP  Glucose 70 - 99 mg/dL 478  90  295   BUN 8 - 23 mg/dL 24  27  32   Creatinine 0.61 - 1.24 mg/dL  6.21  3.08  6.57   Sodium 135 - 145 mmol/L 136  135  137   Potassium 3.5 - 5.1 mmol/L 4.1  4.3  3.7   Chloride 98 - 111 mmol/L 98  100  97   CO2 22 - 32 mmol/L 29  27  29    Calcium 8.9 - 10.3 mg/dL 9.8  9.0  9.8   Total Protein 6.5 - 8.1 g/dL 8.0  7.1    Total Bilirubin 0.3 - 1.2 mg/dL 0.4  0.5    Alkaline Phos 38 - 126 U/L 53  48    AST 15 - 41 U/L 38  31    ALT 0 - 44 U/L 25  20        Microbiology: Synovitic fluid culture no growth so far    Assessment/Plan:  Recurrent Rt knee acute effusion Arthritis- Septic VS non infectious cause Likely the latter Culture pending  Added  Afb and fungal to the synovial fluid  Currently on ceftriaxone -will give total of 2 weeks  of antibiotics.07/07/23   While in the hospital continue with IV ceftriaxone.  On discharge can be switched to p.o. cefadroxil 1 gram BID ( 500mg  x2 BID) Patient did not allow for Lyme serology to be done today  ID will sign off.  Call if needed.

## 2023-06-28 NOTE — TOC Progression Note (Addendum)
Transition of Care [facility]) - CM/SW Discharge Note   Patient Details  Name: Martin [name] MRN: [id number] Date of Birth: [birth year]  [facility] CM/SW Contact:  Marlowe Sax, RN Phone Number: 06/28/2023, 11:22 AM   Clinical Narrative:     I reached out to Micael Hampshire and Geanie Berlin to review for a Long term bed offer No bed offers at this time, Resent the search thru the entire Hub Will need a sign language interpreter    Barriers to Discharge: Continued Medical Work up   Patient Goals and CMS Choice CMS Medicare.gov Compare Post Acute Care list provided to:: Patient Choice offered to / list presented to : Patient  Discharge Placement                         Discharge Plan and Services Additional resources added to the After Visit Summary for                                       Social Determinants of Health (SDOH) Interventions SDOH Screenings   Tobacco Use: Low Risk  (04/29/2023)     Readmission Risk Interventions     No data to display

## 2023-06-28 NOTE — Care Management Important Message (Signed)
Important Message  Patient Details  Name: Martin [name] MRN: [id number] Date of Birth: 03-26-1956   Medicare Important Message Given:  Other (see comment)  Attempted to review and obtain a signature but the patient was sleeping so I left a copy of the Important Message from Medicare on his bedside table. Will try again at another time with an interpreter.   Olegario Messier A Britt Petroni 06/28/2023, 10:58 AM

## 2023-06-28 NOTE — Progress Notes (Signed)
OT Cancellation Note  Patient Details Name: Martin [name] MRN: [id number] DOB: 1956-04-04   Cancelled Treatment:    Reason Eval/Treat Not Completed: Patient declined, no reason specified. OT arrived to room with PT and interpreter present during session. Pt reports he is not interested in therapy and does not want to move at this time. This is 3rd consecutive attempt to evaluate pt. Attempts to educate and encourage but pt declines. OT notified pt that we would be signing off on him and pt states he does not care. MD notified.   Jackquline Denmark, MS, OTR/L , CBIS ascom [phone]  06/28/23, 10:41 AM

## 2023-06-29 DIAGNOSIS — F4323 Adjustment disorder with mixed anxiety and depressed mood: Secondary | ICD-10-CM | POA: Diagnosis not present

## 2023-06-29 DIAGNOSIS — Z933 Colostomy status: Secondary | ICD-10-CM | POA: Diagnosis not present

## 2023-06-29 DIAGNOSIS — H548 Legal blindness, as defined in USA: Secondary | ICD-10-CM | POA: Diagnosis not present

## 2023-06-29 DIAGNOSIS — M00861 Arthritis due to other bacteria, right knee: Secondary | ICD-10-CM | POA: Diagnosis not present

## 2023-06-29 MED ORDER — CEFADROXIL 500 MG PO CAPS
1000.0000 mg | ORAL_CAPSULE | Freq: Two times a day (BID) | ORAL | Status: AC
  Administered 2023-06-30 – 2023-07-06 (×5): 1000 mg via ORAL
  Filled 2023-06-29 (×16): qty 2
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
## 2023-06-29 NOTE — Plan of Care (Addendum)
Dr. Informed - Patient has been refusing ALL care since lunchtime - Including midodrine 1200 and 1700, plus Lab draws.  Patient also refusing to eat dinner - has covers over head and does not want to come out or be bothered

## 2023-06-30 DIAGNOSIS — Z933 Colostomy status: Secondary | ICD-10-CM | POA: Diagnosis not present

## 2023-06-30 DIAGNOSIS — H548 Legal blindness, as defined in USA: Secondary | ICD-10-CM | POA: Diagnosis not present

## 2023-06-30 DIAGNOSIS — M00861 Arthritis due to other bacteria, right knee: Secondary | ICD-10-CM | POA: Diagnosis not present

## 2023-06-30 DIAGNOSIS — F4323 Adjustment disorder with mixed anxiety and depressed mood: Secondary | ICD-10-CM | POA: Diagnosis not present

## 2023-06-30 LAB — AEROBIC/ANAEROBIC CULTURE W GRAM STAIN (SURGICAL/DEEP WOUND): Culture: NO GROWTH
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
## 2023-06-30 NOTE — Plan of Care (Signed)
  Problem: Education: Goal: Knowledge of General Education information will improve Description: Including pain rating scale, medication(s)/side effects and non-pharmacologic comfort measures 06/30/2023 1649 by Grayland Ormond, LPN Outcome: Progressing 06/30/2023 1648 by Grayland Ormond, LPN Outcome: Progressing   Problem: Health Behavior/Discharge Planning: Goal: Ability to manage health-related needs will improve 06/30/2023 1649 by Grayland Ormond, LPN Outcome: Progressing 06/30/2023 1648 by Grayland Ormond, LPN Outcome: Progressing   Problem: Clinical Measurements: Goal: Ability to maintain clinical measurements within normal limits will improve 06/30/2023 1649 by Grayland Ormond, LPN Outcome: Progressing 06/30/2023 1648 by Grayland Ormond, LPN Outcome: Progressing Goal: Will remain free from infection 06/30/2023 1649 by Grayland Ormond, LPN Outcome: Progressing 06/30/2023 1648 by Grayland Ormond, LPN Outcome: Progressing Goal: Diagnostic test results will improve 06/30/2023 1649 by Grayland Ormond, LPN Outcome: Progressing 06/30/2023 1648 by Grayland Ormond, LPN Outcome: Progressing Goal: Cardiovascular complication will be avoided 06/30/2023 1649 by Grayland Ormond, LPN Outcome: Progressing 06/30/2023 1648 by Grayland Ormond, LPN Outcome: Progressing   Problem: Activity: Goal: Risk for activity intolerance will decrease 06/30/2023 1649 by Grayland Ormond, LPN Outcome: Progressing 06/30/2023 1648 by Grayland Ormond, LPN Outcome: Progressing   Problem: Nutrition: Goal: Adequate nutrition will be maintained 06/30/2023 1649 by Grayland Ormond, LPN Outcome: Progressing 06/30/2023 1648 by Grayland Ormond, LPN Outcome: Progressing   Problem: Coping: Goal: Level of anxiety will decrease 06/30/2023 1649 by Grayland Ormond, LPN Outcome: Progressing 06/30/2023 1648 by Grayland Ormond, LPN Outcome: Progressing   Problem:  Elimination: Goal: Will not experience complications related to bowel motility 06/30/2023 1649 by Grayland Ormond, LPN Outcome: Progressing 06/30/2023 1648 by Grayland Ormond, LPN Outcome: Progressing Goal: Will not experience complications related to urinary retention 06/30/2023 1649 by Grayland Ormond, LPN Outcome: Progressing 06/30/2023 1648 by Grayland Ormond, LPN Outcome: Progressing   Problem: Pain Managment: Goal: General experience of comfort will improve 06/30/2023 1649 by Grayland Ormond, LPN Outcome: Progressing 06/30/2023 1648 by Grayland Ormond, LPN Outcome: Progressing   Problem: Safety: Goal: Ability to remain free from injury will improve 06/30/2023 1649 by Vin Yonke J, LPN Outcome: Progressing 06/30/2023 1648 by Grayland Ormond, LPN Outcome: Progressing   Problem: Skin Integrity: Goal: Risk for impaired skin integrity will decrease 06/30/2023 1649 by Grayland Ormond, LPN Outcome: Progressing 06/30/2023 1648 by Grayland Ormond, LPN Outcome: Progressing

## 2023-06-30 NOTE — Plan of Care (Signed)
  Problem: Education: Goal: Knowledge of General Education information will improve Description: Including pain rating scale, medication(s)/side effects and non-pharmacologic comfort measures Outcome: Not Progressing   Problem: Activity: Goal: Risk for activity intolerance will decrease Outcome: Not Progressing   Problem: Nutrition: Goal: Adequate nutrition will be maintained Outcome: Not Progressing    Patient is not communicating with virtual interpreter. Patient refused to be repositioned, unable to  complete a thorough assessment. Patient is uncooperative and refusing medical care.

## 2023-06-30 NOTE — Progress Notes (Signed)
Mobility Specialist - Progress Note     06/30/23 1022  Mobility  Activity Turned to right side;Turned to left side  Activity Response Tolerated well  Mobility Referral Yes  $Mobility charge 1 Mobility   Pt resting in bed on RA upon entry. Pt denied mobility due to pain but, allowed me to take his vitals and interpreter successfully gave pain meds. Pt given education on the importance on exercise and give the option to go outside or possible outpatient gym, pt refused. Pt remains frustrated with staff and asked if he can be approached differently to avoid frustration. Pt requested light touch on right top of shoulder when entering room and announce who you are to establish familiarity. Author will attempt session again at later date.   Martin [name] Mobility Specialist 06/30/23, 10:30 AM

## 2023-07-01 DIAGNOSIS — H548 Legal blindness, as defined in USA: Secondary | ICD-10-CM | POA: Diagnosis not present

## 2023-07-01 DIAGNOSIS — F4323 Adjustment disorder with mixed anxiety and depressed mood: Secondary | ICD-10-CM | POA: Diagnosis not present

## 2023-07-01 DIAGNOSIS — M00861 Arthritis due to other bacteria, right knee: Secondary | ICD-10-CM | POA: Diagnosis not present

## 2023-07-01 DIAGNOSIS — Z933 Colostomy status: Secondary | ICD-10-CM | POA: Diagnosis not present

## 2023-07-01 NOTE — Plan of Care (Signed)
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
## 2023-07-01 NOTE — Plan of Care (Signed)
  Problem: Clinical Measurements: Goal: Diagnostic test results will improve Outcome: Not Applicable Goal: Cardiovascular complication will be avoided Outcome: Progressing   Problem: Activity: Goal: Risk for activity intolerance will decrease Outcome: Not Progressing   Problem: Skin Integrity: Goal: Risk for impaired skin integrity will decrease Outcome: Not Progressing

## 2023-07-01 NOTE — Progress Notes (Signed)
Patient continues to refused medical care.

## 2023-07-01 NOTE — Progress Notes (Signed)
Mobility Specialist - Progress Note     07/01/23 1100  Mobility  Activity Refused mobility   Pt resting in bed upon entry with head covered with blanket. Author announced presence in room so patient could identify through interpreter. Pt refused any mobility exercises and when asked about pain he told me "he already had pain medicine and didn't want to do anything; leave him to sleep". Pt also refused medication and vitals when asked why (through interpreter) he wouldn't provide a direct answer only "I dont want it, leave me alone". Pt given education on the importance of taking medicine(blood pressure, antibiotics, etc) and participating in mobility to gain strength back. Pt was previously motivated to participate in mobility and exercise; performed well during sessions. Author will continue to attempt ambulation.   Johnathan Hausen Mobility Specialist 07/01/23, 11:42 AM

## 2023-07-02 DIAGNOSIS — M00861 Arthritis due to other bacteria, right knee: Secondary | ICD-10-CM | POA: Diagnosis not present

## 2023-07-02 NOTE — Progress Notes (Addendum)
Refused oral medications and breakfast this AM . Communication with white board. Agreed to eye drops, vital signs, Colostomy bag change.
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
## 2023-07-02 NOTE — Plan of Care (Signed)

## 2023-07-02 NOTE — Plan of Care (Signed)
Problem: Education: Goal: Knowledge of General Education information will improve Description: Including pain rating scale, medication(s)/side effects and non-pharmacologic comfort measures Outcome: Not Progressing   Problem: Activity: Goal: Risk for activity intolerance will decrease Outcome: Not Progressing   Problem: Nutrition: Goal: Adequate nutrition will be maintained Outcome: Not Progressing

## 2023-07-02 NOTE — Plan of Care (Addendum)
Pt refused assessments and changing the bed/linen. He also demonstrated aggressive behavior with the staff.   Problem: Pain Managment: Goal: General experience of comfort will improve Outcome: Progressing   Problem: Education: Goal: Knowledge of General Education information will improve Description: Including pain rating scale, medication(s)/side effects and non-pharmacologic comfort measures Outcome: Not Progressing   Problem: Health Behavior/Discharge Planning: Goal: Ability to manage health-related needs will improve Outcome: Not Progressing   Problem: Clinical Measurements: Goal: Ability to maintain clinical measurements within normal limits will improve Outcome: Not Progressing   Problem: Activity: Goal: Risk for activity intolerance will decrease Outcome: Not Progressing   Problem: Nutrition: Goal: Adequate nutrition will be maintained Outcome: Not Progressing   Problem: Coping: Goal: Level of anxiety will decrease Outcome: Not Progressing   Problem: Elimination: Goal: Will not experience complications related to bowel motility Outcome: Not Progressing

## 2023-07-03 DIAGNOSIS — Z933 Colostomy status: Secondary | ICD-10-CM | POA: Diagnosis not present

## 2023-07-03 DIAGNOSIS — M00861 Arthritis due to other bacteria, right knee: Secondary | ICD-10-CM | POA: Diagnosis not present

## 2023-07-03 DIAGNOSIS — F4323 Adjustment disorder with mixed anxiety and depressed mood: Secondary | ICD-10-CM | POA: Diagnosis not present

## 2023-07-03 DIAGNOSIS — H9193 Unspecified hearing loss, bilateral: Secondary | ICD-10-CM | POA: Diagnosis not present
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
## 2023-07-03 NOTE — Plan of Care (Signed)

## 2023-07-04 DIAGNOSIS — H9193 Unspecified hearing loss, bilateral: Secondary | ICD-10-CM | POA: Diagnosis not present

## 2023-07-04 DIAGNOSIS — M00861 Arthritis due to other bacteria, right knee: Secondary | ICD-10-CM | POA: Diagnosis not present

## 2023-07-04 DIAGNOSIS — Z933 Colostomy status: Secondary | ICD-10-CM | POA: Diagnosis not present

## 2023-07-04 DIAGNOSIS — F4323 Adjustment disorder with mixed anxiety and depressed mood: Secondary | ICD-10-CM | POA: Diagnosis not present

## 2023-07-04 NOTE — Progress Notes (Signed)
Interpreter at bedside. Refused morning medications and Vital signs. Md notified. Patient educated. Eye drops given. Requested and received tylenol for mild knee pain.
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
## 2023-07-04 NOTE — Plan of Care (Signed)
  Problem: Pain Managment: Goal: General experience of comfort will improve 07/04/2023 1555 by Danella Sensing, RN Outcome: Progressing 07/04/2023 1554 by Danella Sensing, RN Outcome: Not Progressing   Problem: Safety: Goal: Ability to remain free from injury will improve 07/04/2023 1555 by Danella Sensing, RN Outcome: Progressing 07/04/2023 1554 by Danella Sensing, RN Outcome: Not Progressing   Problem: Elimination: Goal: Will not experience complications related to bowel motility 07/04/2023 1555 by Danella Sensing, RN Outcome: Progressing 07/04/2023 1554 by Danella Sensing, RN Outcome: Not Progressing Goal: Will not experience complications related to urinary retention 07/04/2023 1555 by Danella Sensing, RN Outcome: Progressing 07/04/2023 1554 by Danella Sensing, RN Outcome: Not Progressing

## 2023-07-04 NOTE — Plan of Care (Signed)

## 2023-07-04 NOTE — TOC Progression Note (Signed)
Transition of Care [facility]) - Progression Note    Patient Details  Name: Nitish Kestel MRN: [id number] Date of Birth: [birth year]  Transition of Care [facility]) CM/SW Contact  Marlowe Sax, RN Phone Number: 07/04/2023, 3:36 PM  Clinical Narrative:     Reached out to [facility], they will review the patient to see if they can make a bed offer  Expected Discharge Plan:  (TBD) Barriers to Discharge: Continued Medical Work up  Expected Discharge Plan and Services                                               Social Determinants of Health (SDOH) Interventions SDOH Screenings   Tobacco Use: Low Risk  (04/29/2023)    Readmission Risk Interventions     No data to display

## 2023-07-04 NOTE — TOC Progression Note (Signed)
Transition of Care [facility]) - Progression Note    Patient Details  Name: Martin [name] MRN: [id number] Date of Birth: [birth year]  Transition of Care [facility]) CM/SW Contact  Marlowe Sax, RN Phone Number: 07/04/2023, 3:28 PM  Clinical Narrative:     The patient has had no behaviors while here, he does need Interpreter for being deaf and extremely visually impaired Patient will get startled easily due to his hearing and vision deficits, staff needs to alert him that they are nearby and not abruptly startle him All communication is done through patient writing on a dry erase board. Or in-person sign language interpreter as patient is visually impaired. Patient continues to need long term care placement TOC will continue to attempt to locate a facility that will accommodate his needs    Expected Discharge Plan:  (TBD) Barriers to Discharge: Continued Medical Work up  Expected Discharge Plan and Services                                               Social Determinants of Health (SDOH) Interventions SDOH Screenings   Tobacco Use: Low Risk  (04/29/2023)    Readmission Risk Interventions     No data to display

## 2023-07-04 NOTE — Progress Notes (Signed)
Pt refused bedtime medication except eye drops. He however asked for Tylenol using white board which he took. No other concerns voiced. Dr Arville Care notified of refusal

## 2023-07-05 DIAGNOSIS — M00861 Arthritis due to other bacteria, right knee: Secondary | ICD-10-CM | POA: Diagnosis not present

## 2023-07-05 LAB — CBC
HCT: 27 % — ABNORMAL LOW (ref 39.0–52.0)
Hemoglobin: 8.5 g/dL — ABNORMAL LOW (ref 13.0–17.0)
MCH: 30 pg (ref 26.0–34.0)
MCHC: 31.5 g/dL (ref 30.0–36.0)
MCV: 95.4 fL (ref 80.0–100.0)
Platelets: 199 10*3/uL (ref 150–400)
RBC: 2.83 MIL/uL — ABNORMAL LOW (ref 4.22–5.81)
RDW: 14.1 % (ref 11.5–15.5)
WBC: 4.5 10*3/uL (ref 4.0–10.5)
nRBC: 0 % (ref 0.0–0.2)

## 2023-07-05 LAB — BASIC METABOLIC PANEL
Anion gap: 9 (ref 5–15)
BUN: 21 mg/dL (ref 8–23)
CO2: 30 mmol/L (ref 22–32)
Calcium: 9.3 mg/dL (ref 8.9–10.3)
Chloride: 97 mmol/L — ABNORMAL LOW (ref 98–111)
Creatinine, Ser: 1.07 mg/dL (ref 0.61–1.24)
GFR, Estimated: >60 mL/min (ref >60)
Glucose, Bld: 108 mg/dL — ABNORMAL HIGH (ref 70–99)
Potassium: 4 mmol/L (ref 3.5–5.1)
Sodium: 136 mmol/L (ref 135–145)

## 2023-07-05 NOTE — Progress Notes (Signed)
Refused labs, vital signs, scheduled medications. Communicated via whiteboard. MD notified.

## 2023-07-05 NOTE — Progress Notes (Signed)
Interpreter at bedside. Educated on the need for oral antibiotics, vital signs, and labs.  Received oral antibiotics. Patient only agrees to take antibiotics if packaging is shown and underlined before administration. Patient deaf and legally blind, can see shadows. Writing name of antibiotic big and bold on white board helps patient see/ understand the medication that is being administered. Short phases written on white board are very helpful.  PT/OT order placed. Patient has agreed to work with PT/OT tomorrow 9/26. Willing to do sponge bath, change shirt, and linens at the time of therapy. Interpreter will be at bedside at 1300 9/26.
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
## 2023-07-05 NOTE — Plan of Care (Signed)
Problem: Pain Managment: Goal: General experience of comfort will improve Outcome: Progressing   Problem: Safety: Goal: Ability to remain free from injury will improve Outcome: Progressing

## 2023-07-05 NOTE — TOC Progression Note (Signed)
Transition of Care [facility]) - Progression Note    Patient Details  Name: Brij Vanvactor MRN: [id number] Date of Birth: 02-12-1956  Transition of Care [facility]) CM/SW Contact  Marlowe Sax, RN Phone Number: 07/05/2023, 3:22 PM  Clinical Narrative:    Reached out to Stanton Kidney at [facility] to see if they were able to review, awaiting a response   Expected Discharge Plan:  (TBD) Barriers to Discharge: Continued Medical Work up  Expected Discharge Plan and Services                                               Social Determinants of Health (SDOH) Interventions SDOH Screenings   Tobacco Use: Low Risk  (04/29/2023)    Readmission Risk Interventions     No data to display

## 2023-07-05 NOTE — Plan of Care (Signed)

## 2023-07-05 NOTE — TOC Progression Note (Signed)
Transition of Care [facility]) - Progression Note    Patient Details  Name: Martin [name] MRN: [id number] Date of Birth: [birth year]  Transition of Care [facility]) CM/SW Contact  Marlowe Sax, RN Phone Number: 07/05/2023, 4:20 PM  Clinical Narrative:    I met with the patient and introduced myself using the dry erase white board to communicate, he told me he wants a [place] sandwich with butter and brown bread, I notified the nursing staff.  He dismissed me from his room waving me away with his hand   Expected Discharge Plan:  (TBD) Barriers to Discharge: Continued Medical Work up  Expected Discharge Plan and Services                                               Social Determinants of Health (SDOH) Interventions SDOH Screenings   Tobacco Use: Low Risk  (04/29/2023)    Readmission Risk Interventions     No data to display

## 2023-07-06 DIAGNOSIS — M00861 Arthritis due to other bacteria, right knee: Secondary | ICD-10-CM | POA: Diagnosis not present

## 2023-07-06 NOTE — Progress Notes (Signed)
Pt refused all mediations and vitals this morning. Pt however improved when the interpreter showed up in the room at 1:00 pm. Pt took PO antibiotic and was given a wash up from the physical therapist Belenda Cruise. Pt is now resting in his room.
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
## 2023-07-06 NOTE — Evaluation (Signed)
Physical Therapy Evaluation Patient Details Name: Martin [name] MRN: [id number] DOB: [birth year] Today's Date: 07/06/2023  History of Present Illness  Pt is a 67 y.o. male presenting to hospital 03/30/23 under IVC from residential facility due to behavioral concerns at facility; c/o chronic L knee pain.  PMH includes deafness, impaired vision, OA, hypothyroidism, L knee surgery, ostomy. Patient reporting increased L knee swelling chronic L knee pain.  Status post R knee debridement and washout (06/24/23), PWB.  Clinical Impression  Patient resting in bed, ASL interpreter present at bedside (and throughout session).  Patient alert and oriented to basic information; follows commands, and agreeable to patient-led activities throughout session.  Does require increased time for processing and initiation; often assisted with hand-over-hand to locate items/barriers in room (bedrails) to assist with functional tasks and mobility.  Endorses R knee pain 10/10; meds requested and received per RN during session (with max encouragement from therapist to allow/accept meds).  Demonstrates LE strength grossly 3+ to 4-/5 throughout available ROM; very limited ROM tolerated R knee (limited by pain) and very minimal ROM available L knee (baseline).  Currently requiring min/mod assist for rolling bilat in bed (very slow and deliberate for pain management); does transition to modified-long sitting with bilat UEs on bedrails, sup.  Politely declines attempts for sitting edge of bed or OOB activities due to pain in R knee.  Will continue to assess/progress next session, attempting to facilitate pre-medication as patient allows. Would benefit from skilled PT to address above deficits and promote optimal return to PLOF.; recommend post-acute PT follow up as indicated by interdisciplinary care team.               If plan is discharge home, recommend the following: A lot of help with walking and/or transfers;A lot of help with  bathing/dressing/bathroom   Can travel by private vehicle   No    Equipment Recommendations    Recommendations for Other Services       Functional Status Assessment Patient has had a recent decline in their functional status and demonstrates the ability to make significant improvements in function in a reasonable and predictable amount of time.     Precautions / Restrictions Precautions Precautions: Fall Restrictions Weight Bearing Restrictions: Yes RLE Weight Bearing: Partial weight bearing RLE Partial Weight Bearing Percentage or Pounds: 50%      Mobility  Bed Mobility Overal bed mobility: Modified Independent Bed Mobility: Rolling Rolling: Mod assist, Min assist         General bed mobility comments: hand-over-hand assist for placement on bedrails to assist with bedrails; increased time/effort for rolling, limited by pain in bilat LEs    Transfers                   General transfer comment: refused attempts    Ambulation/Gait               General Gait Details: refused attempts  Stairs            Wheelchair Mobility     Tilt Bed    Modified Rankin (Stroke Patients Only)       Balance                                             Pertinent Vitals/Pain Pain Assessment Pain Assessment: 0-10 Pain Score: 10-Worst pain ever Pain Location: R > L knee  Pain Descriptors / Indicators: Aching, Grimacing, Moaning Pain Intervention(s): Limited activity within patient's tolerance, Monitored during session, Repositioned, RN gave pain meds during session, Patient requesting pain meds-RN notified    Home Living Family/patient expects to be discharged to:: Unsure                   Additional Comments: Residential facility    Prior Function Prior Level of Function : Independent/Modified Independent             Mobility Comments: Has been ambulating here with mobility specialist using RW and bilat axillary  crutches. Began to report increased R knee swelling; has allowed minimal/no mobility since procedure.       Extremity/Trunk Assessment   Upper Extremity Assessment Upper Extremity Assessment: Overall WFL for tasks assessed    Lower Extremity Assessment Lower Extremity Assessment: Generalized weakness (grossly 3+ to 4-/5 throughout, globally limited due to pain; very minimal ROM available L knee, very minimal ROM tolerated R knee.  Notably swollen throughout R knee joint.)       Communication   Communication Communication: Hearing impairment;Difficulty communicating thoughts/reduced clarity of speech (ASL interpreter present throughout session)  Cognition Arousal: Alert Behavior During Therapy: WFL for tasks assessed/performed Overall Cognitive Status: Within Functional Limits for tasks assessed                                          General Comments      Exercises Other Exercises Other Exercises: Assisted with ADL (sponge bath) and linen change, max/dep assist.  Patient did assist with light grooming, upper body bathing and peri-care with set up/supervision; dep for lower body bathing and dressing.  Rolling bilat, min/mod assist-does require hand-over-hand assist for placement on bedrails to facilitate movement   Assessment/Plan    PT Assessment Patient needs continued PT services  PT Problem List Decreased activity tolerance;Decreased mobility;Pain;Decreased range of motion;Decreased safety awareness;Decreased strength;Decreased cognition;Decreased knowledge of use of DME;Decreased knowledge of precautions       PT Treatment Interventions DME instruction;Gait training;Functional mobility training;Therapeutic activities;Therapeutic exercise;Balance training;Patient/family education    PT Goals (Current goals can be found in the Care Plan section)  Acute Rehab PT Goals Patient Stated Goal: for R knee to feel better PT Goal Formulation: With patient Time  For Goal Achievement: 08/05/23 Potential to Achieve Goals: Fair    Frequency Min 1X/week     Co-evaluation               AM-PAC PT "6 Clicks" Mobility  Outcome Measure Help needed turning from your back to your side while in a flat bed without using bedrails?: A Little Help needed moving from lying on your back to sitting on the side of a flat bed without using bedrails?: A Lot Help needed moving to and from a bed to a chair (including a wheelchair)?: Total Help needed standing up from a chair using your arms (e.g., wheelchair or bedside chair)?: Total Help needed to walk in hospital room?: Total Help needed climbing 3-5 steps with a railing? : Total 6 Click Score: 9    End of Session   Activity Tolerance: Patient tolerated treatment well;Patient limited by pain Patient left: in bed;with call bell/phone within reach;with bed alarm set Nurse Communication: Mobility status PT Visit Diagnosis: Other abnormalities of gait and mobility (R26.89);Muscle weakness (generalized) (M62.81);Difficulty in walking, not elsewhere classified (R26.2);Pain Pain - Right/Left:  Right Pain - part of body: Knee    Time: 1610-9604 PT Time Calculation (min) (ACUTE ONLY): 71 min   Charges:   PT Evaluation $PT Re-evaluation: 1 Re-eval PT Treatments $Therapeutic Activity: 38-52 mins PT General Charges $$ ACUTE PT VISIT: 1 Visit        Kenni Newton H. Manson Passey, PT, DPT, NCS 07/06/23, 3:31 PM [phone]

## 2023-07-06 NOTE — Progress Notes (Signed)
Subjective: 12 Days Post-Op Procedure(s) (LRB): ARTHROSCOPY KNEE  DEBRIDEMENT AND WASHOUT (Right) I have been asked to see the patient again about the swelling in his knee.  The effusion is soft, there is no redness, there is no warmth, there is no real tenderness. His white blood count cell remains completely normal at 4500.  He has remained completely afebrile.  I think his pain is just from the fullness of the knee. He has never grown any organisms from the knee, either now or in 2017.  He needs a rheumatologic workup.  Dr. Joylene Draft did not think that this was an infection either.  Patient reports pain as mild.  Objective:   VITALS:   Vitals:   07/05/23 1651 07/06/23 1539  BP: 101/71 91/64  Pulse: 65 62  Resp: 18 16  Temp:  97.8 F (36.6 C)  SpO2: 100% 96%    Neurologically intact Dorsiflexion/Plantar flexion intact Incision: dressing C/D/I  LABS Recent Labs    07/05/23 1718  HGB 8.5*  HCT 27.0*  WBC 4.5  PLT 199    Recent Labs    07/05/23 1718  NA 136  K 4.0  BUN 21  CREATININE 1.07  GLUCOSE 108*    No results for input(s): "LABPT", "INR" in the last 72 hours.   Assessment/Plan: 12 Days Post-Op Procedure(s) (LRB): ARTHROSCOPY KNEE  DEBRIDEMENT AND WASHOUT (Right)   Up with therapy Recommend rheumatology workup.  I do not think although labs I ordered were never drawn postop.

## 2023-07-06 NOTE — Plan of Care (Signed)

## 2023-07-06 NOTE — TOC Progression Note (Addendum)
Transition of Care [facility]) - Progression Note    Patient Details  Name: Martin [name] MRN: [id number] Date of Birth: [birth year]  Transition of Care [facility]) CM/SW Contact  Marlowe Sax, RN Phone Number: 07/06/2023, 12:13 PM  Clinical Narrative:    The Don at [facility] is reviewing the patient for a potential bedoffer, in the meantime the patient remains here and has been pleasant.  He continues to use a dry erase board for communication or the sign language interpreter  Also reached out to [employer] who helps with placement to see if she was able to identify any possible facilities that can accommodate the patient    Expected Discharge Plan:  (TBD) Barriers to Discharge: Continued Medical Work up  Expected Discharge Plan and Services                                               Social Determinants of Health (SDOH) Interventions SDOH Screenings   Tobacco Use: Low Risk  (04/29/2023)    Readmission Risk Interventions     No data to display

## 2023-07-06 NOTE — NC FL2 (Cosign Needed Addendum)
[place] MEDICAID FL2 LEVEL OF CARE FORM     IDENTIFICATION  Patient Name: Martin [name] Birthdate: 1956/01/15 Sex: male Admission Date (Current Location): 03/30/2023  [place] and [place] Number:  [name] [id number] R Facility and Address:  [facility], [address], [place], [place] [address]      Provider Number: [id number]  Attending Physician Name and Address:  Lucile Shutters, MD  Relative Name and Phone Number:  Geoffery Lyons, brother [phone]    Current Level of Care: Hospital Recommended Level of Care: Group Home or Family Care home Prior Approval Number:    Date Approved/Denied:   PASRR Number: [id number]  Discharge Plan: Domiciliary (Rest home)    Current Diagnoses: Patient Active Problem List   Diagnosis Date Noted   Colostomy status (HCC) 06/24/2023   Abdominal pain 07/15/2020   Abdominal distension 06/04/2020   Ogilvie's syndrome    Leukopenia    Thrombocytopenia (HCC)    CKD (chronic kidney disease)    Distended abdomen    Gout    Adjustment disorder with mixed anxiety and depressed mood 03/22/2017   Lethargy 03/21/2017   Acute encephalopathy    FTT (failure to thrive) in adult    Protein-calorie malnutrition, severe 12/23/2015   Septic joint of right knee joint (HCC) 12/21/2015   Chronic pain syndrome 12/21/2015   Legally blind 12/21/2015   Deaf 12/21/2015   Hypothyroidism 12/21/2015   Swelling of left hand 12/21/2015   Chronic constipation     Orientation RESPIRATION BLADDER Height & Weight     Self, Time, Situation, Place  Normal Continent Weight: 60 kg Height:     BEHAVIORAL SYMPTOMS/MOOD NEUROLOGICAL BOWEL NUTRITION STATUS      Colostomy  (see dc summary)  AMBULATORY STATUS COMMUNICATION OF NEEDS Skin   Wheelchair bound, can transferColostomy Non-Verbally (uses dry erase board or sign language interpreter) Other (Comment) (colostomy)                       Personal Care Assistance Level of  Assistance  Bathing, Feeding, Dressing Bathing Assistance: Limited assistance Feeding assistance: Limited assistance Dressing Assistance: Limited assistance     Functional Limitations Info  Sight, Hearing, Speech Sight Info: Impaired (legally blind requires a dry erase board) Hearing Info: Impaired (deaf) Speech Info: Impaired (non verbal)    SPECIAL CARE FACTORS FREQUENCY  Colostomy      As needed              Contractures Contractures Info: Not present    Additional Factors Info  Code Status, Allergies Code Status Info: Full code Allergies Info: Ivp Dye (Iodinated Contrast Media)           Current Medications (07/06/2023):  This is the current hospital active medication list Current Facility-Administered Medications  Medication Dose Route Frequency Provider Last Rate Last Admin   acetaminophen (TYLENOL) tablet 650 mg  650 mg Oral Q6H PRN Deeann Saint, MD   650 mg at 07/04/23 2220   Or   acetaminophen (TYLENOL) suppository 650 mg  650 mg Rectal Q6H PRN Deeann Saint, MD       alum & mag hydroxide-simeth (MAALOX/MYLANTA) 200-200-20 MG/5ML suspension 30 mL  30 mL Oral Q6H PRN Deeann Saint, MD       ascorbic acid (VITAMIN C) tablet 500 mg  500 mg Oral Daily Gillis Santa, MD   500 mg at 06/30/23 8413   cefadroxil (DURICEF) capsule 1,000 mg  1,000 mg Oral BID Marcelino Duster, MD   1,000  mg at 07/05/23 2228   diclofenac Sodium (VOLTAREN) 1 % topical gel 2 g  2 g Topical Q6H PRN Deeann Saint, MD       dorzolamide (TRUSOPT) 2 % ophthalmic solution 1 drop  1 drop Both Eyes BID Deeann Saint, MD   1 drop at 07/05/23 2229   feeding supplement (ENSURE ENLIVE / ENSURE PLUS) liquid 237 mL  237 mL Oral BID BM Deeann Saint, MD   237 mL at 06/30/23 1400   haloperidol lactate (HALDOL) injection 2 mg  2 mg Intravenous Once Gillis Santa, MD       HYDROcodone-acetaminophen (NORCO) 7.5-325 MG per tablet 1-2 tablet  1-2 tablet Oral Q4H PRN Deeann Saint, MD   2 tablet at  06/26/23 1111   HYDROcodone-acetaminophen (NORCO/VICODIN) 5-325 MG per tablet 1-2 tablet  1-2 tablet Oral Q4H PRN Deeann Saint, MD   2 tablet at 07/03/23 1349   hydrOXYzine (ATARAX) tablet 25 mg  25 mg Oral TID PRN Deeann Saint, MD   25 mg at 04/15/23 0327   iron polysaccharides (NIFEREX) capsule 150 mg  150 mg Oral Daily Gillis Santa, MD   150 mg at 06/30/23 0937   latanoprost (XALATAN) 0.005 % ophthalmic solution 1 drop  1 drop Both Eyes QHS Deeann Saint, MD   1 drop at 07/05/23 2229   levothyroxine (SYNTHROID) tablet 125 mcg  125 mcg Oral Q0600 Deeann Saint, MD       lidocaine (PF) (XYLOCAINE) 1 % injection 5 mL  5 mL Other Once Deeann Saint, MD       metoCLOPramide (REGLAN) tablet 5-10 mg  5-10 mg Oral Q8H PRN Deeann Saint, MD       Or   metoCLOPramide (REGLAN) injection 5-10 mg  5-10 mg Intravenous Q8H PRN Deeann Saint, MD       midodrine (PROAMATINE) tablet 10 mg  10 mg Oral TID WC Gillis Santa, MD   10 mg at 06/30/23 0800   morphine (PF) 2 MG/ML injection 0.5-1 mg  0.5-1 mg Intravenous Q2H PRN Deeann Saint, MD       OLANZapine zydis (ZYPREXA) disintegrating tablet 5 mg  5 mg Oral BID PRN Deeann Saint, MD       Or   OLANZapine [facility]) injection 5 mg  5 mg Intramuscular BID PRN Deeann Saint, MD       ondansetron [facility]) tablet 4 mg  4 mg Oral Q6H PRN Deeann Saint, MD       Or   ondansetron [facility]) injection 4 mg  4 mg Intravenous Q6H PRN Deeann Saint, MD       polyethylene glycol (MIRALAX / GLYCOLAX) packet 17 g  17 g Oral Daily Deeann Saint, MD   17 g at 06/30/23 1610   polyvinyl alcohol (LIQUIFILM TEARS) 1.4 % ophthalmic solution 2 drop  2 drop Both Eyes PRN Deeann Saint, MD   2 drop at 06/16/23 0806   traZODone (DESYREL) tablet 50 mg  50 mg Oral QHS PRN Deeann Saint, MD   50 mg at 04/19/23 0028   trolamine salicylate (ASPERCREME) 10 % cream   Topical BID PRN Deeann Saint, MD   Given at 06/09/23 1157   Vitamin D (Ergocalciferol) (DRISDOL) 1.25 MG  (50000 UNIT) capsule 50,000 Units  50,000 Units Oral Q7 days Gillis Santa, MD         Discharge Medications: Please see discharge summary for a list of discharge medications.  Relevant Imaging Results:  Relevant Lab Results:   Additional Information SSN [id number]  Marlowe Sax, RN

## 2023-07-06 NOTE — Plan of Care (Signed)

## 2023-07-07 DIAGNOSIS — M009 Pyogenic arthritis, unspecified: Secondary | ICD-10-CM | POA: Diagnosis not present

## 2023-07-07 NOTE — TOC Progression Note (Signed)
Transition of Care [facility]) - Progression Note    Patient Details  Name: Martin [name] MRN: [id number] Date of Birth: Jun 24, 1956  Transition of Care [facility]) CM/SW Contact  Marlowe Sax, RN Phone Number: 07/07/2023, 12:10 PM  Clinical Narrative:     Met with the patient in the room with the sign language interpreter, had a pleasant conversation,  he would like to go to a facility in [place], I reached out to [employer] at  [phone] they do not have any long term beds available Reached out to [employer] [phone], they do not have availability  The patient does have a wife that does not want to be any part of his care and does not want to be called  Has been ambulating here with mobility specialist using RW and bilat axillary crutches. Began to report increased R knee swelling; has allowed minimal/no mobility since procedure.   Is to have rheumatology see him on Monday for the knee     Expected Discharge Plan:  (TBD) Barriers to Discharge: Continued Medical Work up  Expected Discharge Plan and Services                                               Social Determinants of Health (SDOH) Interventions SDOH Screenings   Tobacco Use: Low Risk  (04/29/2023)    Readmission Risk Interventions     No data to display

## 2023-07-07 NOTE — Plan of Care (Signed)

## 2023-07-07 NOTE — Plan of Care (Incomplete)

## 2023-07-07 NOTE — Progress Notes (Signed)
PT Cancellation Note  Patient Details Name: Martin [name] MRN: [id number] DOB: Aug 18, 1956   Cancelled Treatment:    Reason Eval/Treat Not Completed: Patient declined, no reason specified (Coordinated time for follow up with session with intepreter on previous date.  Patient sleeping upon arrival; awakens to light touch to R shoulder, but declines participation with session at this time despite encouragement.  Will re-attempt at later time/date as patient allows.)   Kavitha Lansdale H. Manson Passey, PT, DPT, NCS 07/07/23, 12:05 PM [phone]
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
## 2023-07-08 DIAGNOSIS — Z933 Colostomy status: Secondary | ICD-10-CM | POA: Diagnosis not present

## 2023-07-08 DIAGNOSIS — F4323 Adjustment disorder with mixed anxiety and depressed mood: Secondary | ICD-10-CM | POA: Diagnosis not present

## 2023-07-08 DIAGNOSIS — H9193 Unspecified hearing loss, bilateral: Secondary | ICD-10-CM | POA: Diagnosis not present

## 2023-07-08 DIAGNOSIS — M00861 Arthritis due to other bacteria, right knee: Secondary | ICD-10-CM | POA: Diagnosis not present
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
## 2023-07-08 NOTE — Plan of Care (Signed)

## 2023-07-09 DIAGNOSIS — Z933 Colostomy status: Secondary | ICD-10-CM | POA: Diagnosis not present

## 2023-07-09 DIAGNOSIS — M00861 Arthritis due to other bacteria, right knee: Secondary | ICD-10-CM | POA: Diagnosis not present

## 2023-07-09 DIAGNOSIS — F4323 Adjustment disorder with mixed anxiety and depressed mood: Secondary | ICD-10-CM | POA: Diagnosis not present

## 2023-07-09 DIAGNOSIS — H9193 Unspecified hearing loss, bilateral: Secondary | ICD-10-CM | POA: Diagnosis not present

## 2023-07-09 NOTE — Plan of Care (Signed)
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
## 2023-07-09 NOTE — Plan of Care (Signed)

## 2023-07-10 DIAGNOSIS — M009 Pyogenic arthritis, unspecified: Secondary | ICD-10-CM | POA: Diagnosis not present

## 2023-07-10 MED ORDER — PREDNISONE 20 MG PO TABS: 30.0000 mg | ORAL_TABLET | Freq: Every day | ORAL | Status: DC

## 2023-07-10 MED ORDER — PREDNISONE 20 MG PO TABS: 20.0000 mg | ORAL_TABLET | Freq: Every day | ORAL | Status: DC

## 2023-07-10 MED ORDER — PREDNISONE 20 MG PO TABS
40.0000 mg | ORAL_TABLET | Freq: Every day | ORAL | Status: AC
  Administered 2023-07-10 – 2023-07-11 (×2): 40 mg via ORAL
  Filled 2023-07-10 (×3): qty 2

## 2023-07-10 MED ORDER — PREDNISONE 10 MG PO TABS: 10.0000 mg | ORAL_TABLET | Freq: Every day | ORAL | Status: DC

## 2023-07-10 NOTE — TOC Progression Note (Signed)
Transition of Care [facility]) - Progression Note    Patient Details  Name: Martin [name] MRN: [id number] Date of Birth: 03-27-1956  Transition of Care [facility]) CM/SW Contact  Marlowe Sax, RN Phone Number: 07/10/2023, 2:58 PM  Clinical Narrative:    Still no bed offers at this time, resent out to all that are considering or pending   Expected Discharge Plan:  (TBD) Barriers to Discharge: Continued Medical Work up  Expected Discharge Plan and Services                                               Social Determinants of Health (SDOH) Interventions SDOH Screenings   Tobacco Use: Low Risk  (04/29/2023)    Readmission Risk Interventions     No data to display

## 2023-07-10 NOTE — Plan of Care (Signed)
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
## 2023-07-11 DIAGNOSIS — M009 Pyogenic arthritis, unspecified: Secondary | ICD-10-CM | POA: Diagnosis not present

## 2023-07-11 MED ORDER — PANTOPRAZOLE SODIUM 40 MG PO TBEC: 40.0000 mg | DELAYED_RELEASE_TABLET | Freq: Every day | ORAL | Status: DC

## 2023-07-11 NOTE — TOC Progression Note (Signed)
Transition of Care [facility]) - Progression Note    Patient Details  Name: Martin [name] MRN: [id number] Date of Birth: [birth year]  Transition of Care [facility]) CM/SW Contact  Marlowe Sax, RN Phone Number: 07/11/2023, 10:53 AM  Clinical Narrative:    [facility], [phone] asking if they can review the patient for a long term ALF bed, faxed the referral to [phone] Awaiting to see if they can accept the patient    Expected Discharge Plan:  (TBD) Barriers to Discharge: Continued Medical Work up  Expected Discharge Plan and Services                                               Social Determinants of Health (SDOH) Interventions SDOH Screenings   Tobacco Use: Low Risk  (04/29/2023)    Readmission Risk Interventions     No data to display

## 2023-07-11 NOTE — Plan of Care (Signed)

## 2023-07-11 NOTE — Plan of Care (Signed)

## 2023-07-11 NOTE — Plan of Care (Signed)
°  Problem: Education: Goal: Knowledge of General Education information will improve Description: Including pain rating scale, medication(s)/side effects and non-pharmacologic comfort measures Outcome: Progressing   Problem: Health Behavior/Discharge Planning: Goal: Ability to manage health-related needs will improve Outcome: Progressing   Problem: Clinical Measurements: Goal: Ability to maintain clinical measurements within normal limits will improve Outcome: Progressing   Problem: Nutrition: Goal: Adequate nutrition will be maintained Outcome: Progressing   Problem: Coping: Goal: Level of anxiety will decrease Outcome: Progressing   Problem: Elimination: Goal: Will not experience complications related to bowel motility Outcome: Progressing   Problem: Safety: Goal: Ability to remain free from injury will improve Outcome: Progressing
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
## 2023-07-12 DIAGNOSIS — M009 Pyogenic arthritis, unspecified: Secondary | ICD-10-CM | POA: Diagnosis not present

## 2023-07-12 DIAGNOSIS — H9193 Unspecified hearing loss, bilateral: Secondary | ICD-10-CM | POA: Diagnosis not present

## 2023-07-12 DIAGNOSIS — F4323 Adjustment disorder with mixed anxiety and depressed mood: Secondary | ICD-10-CM | POA: Diagnosis not present

## 2023-07-12 DIAGNOSIS — H548 Legal blindness, as defined in USA: Secondary | ICD-10-CM | POA: Diagnosis not present

## 2023-07-12 MED ORDER — POLYETHYLENE GLYCOL 3350 17 G PO PACK
17.0000 g | PACK | ORAL | Status: DC
  Administered 2023-07-13 – 2023-09-26 (×32): 17 g via ORAL
  Filled 2023-07-12 (×53): qty 1

## 2023-07-12 MED ORDER — PREDNISONE 20 MG PO TABS
20.0000 mg | ORAL_TABLET | ORAL | Status: AC
  Administered 2023-07-16 – 2023-07-18 (×2): 20 mg via ORAL
  Filled 2023-07-12 (×3): qty 1

## 2023-07-12 MED ORDER — DORZOLAMIDE HCL 2 % OP SOLN
1.0000 [drp] | Freq: Two times a day (BID) | OPHTHALMIC | Status: DC
  Administered 2023-07-12 – 2023-07-24 (×24): 1 [drp] via OPHTHALMIC
  Filled 2023-07-12: qty 10

## 2023-07-12 MED ORDER — POLYSACCHARIDE IRON COMPLEX 150 MG PO CAPS
150.0000 mg | ORAL_CAPSULE | ORAL | Status: DC
  Administered 2023-07-13 – 2023-09-27 (×9): 150 mg via ORAL
  Filled 2023-07-12 (×99): qty 1

## 2023-07-12 MED ORDER — PANTOPRAZOLE SODIUM 40 MG PO TBEC
40.0000 mg | DELAYED_RELEASE_TABLET | ORAL | Status: DC
  Administered 2023-07-24 – 2023-09-29 (×20): 40 mg via ORAL
  Filled 2023-07-12 (×45): qty 1

## 2023-07-12 MED ORDER — LEVOTHYROXINE SODIUM 50 MCG PO TABS
125.0000 ug | ORAL_TABLET | ORAL | Status: DC
  Administered 2023-07-13 – 2024-02-13 (×99): 125 ug via ORAL
  Filled 2023-07-12 (×135): qty 1

## 2023-07-12 MED ORDER — PREDNISONE 20 MG PO TABS
30.0000 mg | ORAL_TABLET | ORAL | Status: AC
  Administered 2023-07-13 – 2023-07-15 (×3): 30 mg via ORAL
  Filled 2023-07-12 (×3): qty 1

## 2023-07-12 MED ORDER — PREDNISONE 10 MG PO TABS
10.0000 mg | ORAL_TABLET | ORAL | Status: AC
  Administered 2023-07-19: 10 mg via ORAL
  Filled 2023-07-12 (×3): qty 1

## 2023-07-12 MED ORDER — VITAMIN C 500 MG PO TABS
500.0000 mg | ORAL_TABLET | ORAL | Status: DC
  Administered 2023-07-13 – 2023-10-07 (×41): 500 mg via ORAL
  Filled 2023-07-12 (×61): qty 1

## 2023-07-12 NOTE — TOC Progression Note (Signed)
Transition of Care [facility]) - Progression Note    Patient Details  Name: Martin [name] MRN: [id number] Date of Birth: May 04, 1956  Transition of Care [facility]) CM/SW Contact  Marlowe Sax, RN Phone Number: 07/12/2023, 11:58 AM  Clinical Narrative:     TOC continues to follow and at this time has no bed offers for LTC bed either in ALF or group home,  Will continue to reach out and send looking for a bed offer  Expected Discharge Plan:  (TBD) Barriers to Discharge: Continued Medical Work up  Expected Discharge Plan and Services                                               Social Determinants of Health (SDOH) Interventions SDOH Screenings   Tobacco Use: Low Risk  (04/29/2023)    Readmission Risk Interventions     No data to display

## 2023-07-12 NOTE — Progress Notes (Signed)
Physical Therapy Treatment Patient Details Name: Martin [name] MRN: [id number] DOB: 01/13/1956 Today's Date: 07/12/2023   History of Present Illness Pt is a 67 y.o. male presenting to hospital 03/30/23 under IVC from residential facility due to behavioral concerns at facility; c/o chronic L knee pain.  PMH includes deafness, impaired vision, OA, hypothyroidism, L knee surgery, ostomy. Patient reporting increased L knee swelling chronic L knee pain.  Status post R knee debridement and washout (06/24/23), PWB.    PT Comments  Patient agreeable to self-initiated, active ROM therex to R knee/LE as described below.  Improving tolerance for ROM, improved participation with isolated activities; however, much prefers to design his own plan (with gentle guidance). Reviewed role of chair position in bed, importance of transition to edge of bed; patient voices understanding, but prefers to defer these activities to later session.  ASL Interpreter, [name], present throughout session to assist with communication   If plan is discharge home, recommend the following: A lot of help with walking and/or transfers;A lot of help with bathing/dressing/bathroom   Can travel by private vehicle     No  Equipment Recommendations       Recommendations for Other Services       Precautions / Restrictions Precautions Precautions: Fall Precaution Comments: Legally blind, deaf, Ostomy Restrictions Weight Bearing Restrictions: Yes RLE Weight Bearing: Partial weight bearing RLE Partial Weight Bearing Percentage or Pounds: 50 LLE Weight Bearing: Weight bearing as tolerated     Mobility  Bed Mobility Overal bed mobility: Needs Assistance Bed Mobility: Rolling Rolling: Supervision, Contact guard assist         General bed mobility comments: refused attempts    Transfers                   General transfer comment: refused attempts    Ambulation/Gait               General Gait Details:  refused attempts   Stairs             Wheelchair Mobility     Tilt Bed    Modified Rankin (Stroke Patients Only)       Balance Overall balance assessment: Needs assistance                                          Cognition Arousal: Alert Behavior During Therapy: WFL for tasks assessed/performed Overall Cognitive Status: Within Functional Limits for tasks assessed                                          Exercises Other Exercises Other Exercises: Supine R LE therex, 1x10, act ROM: heel slides (tolerating approx 30-35 degrees of knee flexion), hip abduct/adduct.  Minimal clinical indicators of pain in knee with gentle, graded, patient-initiated activities. Other Exercises: Did encourage transition to chair position in bed; patient declined, "not today" Other Exercises: Patient was agreeable to trial transition towards edge of bed, but experiences increased pain to R knee when attempting to move towards edge of bed and aborts efforts despite encouragement.    General Comments        Pertinent Vitals/Pain Pain Assessment Pain Assessment: Faces Faces Pain Scale: Hurts little more Pain Location: R > L knee Pain Descriptors / Indicators: Aching, Grimacing Pain Intervention(s): Limited  activity within patient's tolerance, Monitored during session, Repositioned    Home Living                          Prior Function            PT Goals (current goals can now be found in the care plan section) Acute Rehab PT Goals Patient Stated Goal: for R knee to feel better PT Goal Formulation: With patient Time For Goal Achievement: 08/05/23 Potential to Achieve Goals: Fair Progress towards PT goals: Progressing toward goals    Frequency    Min 1X/week      PT Plan Current plan remains appropriate    Co-evaluation              AM-PAC PT "6 Clicks" Mobility   Outcome Measure  Help needed turning from your back  to your side while in a flat bed without using bedrails?: A Little   Help needed moving to and from a bed to a chair (including a wheelchair)?: Total Help needed standing up from a chair using your arms (e.g., wheelchair or bedside chair)?: Total Help needed to walk in hospital room?: Total Help needed climbing 3-5 steps with a railing? : Total 6 Click Score: 7    End of Session   Activity Tolerance: Patient tolerated treatment well;Patient limited by pain Patient left: in bed;with call bell/phone within reach;with bed alarm set Nurse Communication: Mobility status PT Visit Diagnosis: Other abnormalities of gait and mobility (R26.89);Muscle weakness (generalized) (M62.81);Difficulty in walking, not elsewhere classified (R26.2);Pain Pain - Right/Left: Right Pain - part of body: Knee     Time: 1312-1330 PT Time Calculation (min) (ACUTE ONLY): 18 min  Charges:    $Therapeutic Exercise: 8-22 mins PT General Charges $$ ACUTE PT VISIT: 1 Visit                    Caeleb Batalla H. Manson Passey, PT, DPT, NCS 07/12/23, 1:41 PM [phone]
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
## 2023-07-12 NOTE — Progress Notes (Signed)
Physical Therapy Treatment Patient Details Name: Martin [name] MRN: [id number] DOB: Apr 04, 1956 Today's Date: 07/12/2023   History of Present Illness Pt is a 67 y.o. male presenting to hospital 03/30/23 under IVC from residential facility due to behavioral concerns at facility; c/o chronic L knee pain.  PMH includes deafness, impaired vision, OA, hypothyroidism, L knee surgery, ostomy. Patient reporting increased L knee swelling chronic L knee pain.  Status post R knee debridement and washout (06/24/23), PWB.    PT Comments  Patient with improved effort and participation during session today; does require significantly increased time to participate with/complete all activities and remains very self-directive in tasks and overall participation.  Demonstrates improved active effort and participation compared to previous sessions, but remains unable/unwilling to tolerate transition towards edge of bed.  Will continue efforts as appropriate with emphasis on R knee ROM and OOB mobility as allowed.  Communication via white board and via in-person ASL interpreter, Amy.  Will continue to try to align sessions with interpreter present to maximize efficiency of efforts.   If plan is discharge home, recommend the following: A lot of help with walking and/or transfers;A lot of help with bathing/dressing/bathroom   Can travel by private vehicle        Equipment Recommendations  None recommended by PT    Recommendations for Other Services       Precautions / Restrictions Precautions Precautions: Fall Precaution Comments: Legally blind, deaf, Ostomy Restrictions Weight Bearing Restrictions: Yes RLE Weight Bearing: Partial weight bearing RLE Partial Weight Bearing Percentage or Pounds: 50% LLE Weight Bearing: Weight bearing as tolerated     Mobility  Bed Mobility Overal bed mobility: Needs Assistance Bed Mobility: Rolling Rolling: Supervision, Contact guard assist         General bed mobility  comments: supine to modified long sitting, supervision; limited tolerance for any strain to R knee with movement transitions    Transfers                   General transfer comment: refused attempts    Ambulation/Gait               General Gait Details: refused attempts   Stairs             Wheelchair Mobility     Tilt Bed    Modified Rankin (Stroke Patients Only)       Balance                                            Cognition Arousal: Alert Behavior During Therapy: WFL for tasks assessed/performed Overall Cognitive Status: Within Functional Limits for tasks assessed                                          Exercises Other Exercises Other Exercises: Assisted with grooming (shaving) and ADL activities, supine in bed, min/mod assist; more active effort and participation with task this date, actively bathing self (upper body, peri-area) with set sup/supervision.  Mod assist to manage briefs and lower body bathing; but does tolerate bridging (with full clearance) and functional flexion of R hip/knee while managing clothing. Other Exercises: R LE SLR and heel slides, 1x8-10, active ROM through tolerable range.  Notable decrease in edema to R knee, improved tolerance  for self-guided ROM. Encouraged to continue ROM on regular basis to maintain mobility in knee during continued healing. Other Exercises: Patient was agreeable to trial transition towards edge of bed, but experiences increased pain to R knee when attempting to move towards edge of bed and aborts efforts despite encouragement.    General Comments        Pertinent Vitals/Pain Pain Assessment Pain Assessment: Faces Faces Pain Scale: Hurts little more Pain Location: R > L knee Pain Intervention(s): Limited activity within patient's tolerance, Monitored during session, Repositioned    Home Living                          Prior Function             PT Goals (current goals can now be found in the care plan section) Acute Rehab PT Goals Patient Stated Goal: for R knee to feel better PT Goal Formulation: With patient Time For Goal Achievement: 08/05/23 Potential to Achieve Goals: Fair Progress towards PT goals: Progressing toward goals    Frequency    Min 1X/week      PT Plan Current plan remains appropriate    Co-evaluation              AM-PAC PT "6 Clicks" Mobility   Outcome Measure  Help needed turning from your back to your side while in a flat bed without using bedrails?: A Little Help needed moving from lying on your back to sitting on the side of a flat bed without using bedrails?: A Lot Help needed moving to and from a bed to a chair (including a wheelchair)?: Total Help needed standing up from a chair using your arms (e.g., wheelchair or bedside chair)?: Total Help needed to walk in hospital room?: Total Help needed climbing 3-5 steps with a railing? : Total 6 Click Score: 9    End of Session   Activity Tolerance: Patient tolerated treatment well;Patient limited by pain Patient left: in bed;with call bell/phone within reach;with bed alarm set Nurse Communication: Mobility status PT Visit Diagnosis: Other abnormalities of gait and mobility (R26.89);Muscle weakness (generalized) (M62.81);Difficulty in walking, not elsewhere classified (R26.2);Pain Pain - Right/Left: Right Pain - part of body: Knee     Time: 1324-4010 PT Time Calculation (min) (ACUTE ONLY): 98 min  Charges:    $Therapeutic Exercise: 8-22 mins $Therapeutic Activity: 68-82 mins PT General Charges $$ ACUTE PT VISIT: 1 Visit                    Hilliard Borges H. Manson Passey, PT, DPT, NCS 07/12/23, 9:30 AM [phone]

## 2023-07-12 NOTE — Progress Notes (Signed)
Patient refusing medications and vitals this am

## 2023-07-12 NOTE — Plan of Care (Signed)

## 2023-07-13 DIAGNOSIS — M009 Pyogenic arthritis, unspecified: Secondary | ICD-10-CM | POA: Diagnosis not present

## 2023-07-13 NOTE — Plan of Care (Signed)

## 2023-07-13 NOTE — Progress Notes (Signed)
Physical Therapy Treatment Patient Details Name: Martin [name] MRN: [id number] DOB: Feb 06, 1956 Today's Date: 07/13/2023   History of Present Illness Pt is a 67 y.o. male presenting to hospital 03/30/23 under IVC from residential facility due to behavioral concerns at facility; c/o chronic L knee pain.  PMH includes deafness, impaired vision, OA, hypothyroidism, L knee surgery, ostomy. Patient reporting increased L knee swelling chronic L knee pain.  Status post R knee debridement and washout (06/24/23), PWB.    PT Comments  Patient willing/able to progress to unsupported edge of bed this date, min assist for R LE management.  Tolerated position well with minimal/no reports of dizziness with transition to upright. Progressive increase in tolerance for R LE activity, with emphasis on R knee flexion as tolerated.  Remains very limited in flexion, but voices understanding of importance of knee flexion and willing to address as able within tolerable range.  Plan to attempt OOB to chair (anticipate need for scoot pivot) next session as appropriate.  Cindi Carbon, ASL interpreter, present throughout session to assist with communication needs.    If plan is discharge home, recommend the following: A lot of help with walking and/or transfers;A lot of help with bathing/dressing/bathroom   Can travel by private vehicle     No  Equipment Recommendations       Recommendations for Other Services       Precautions / Restrictions Precautions Precautions: Fall Precaution Comments: Legally blind, deaf, Ostomy Restrictions Weight Bearing Restrictions: Yes RLE Weight Bearing: Partial weight bearing RLE Partial Weight Bearing Percentage or Pounds: 50 LLE Weight Bearing: Weight bearing as tolerated     Mobility  Bed Mobility Overal bed mobility: Needs Assistance Bed Mobility: Supine to Sit     Supine to sit: Min assist Sit to supine: Min assist   General bed mobility comments: assist for LE  management (for pain control)    Transfers                   General transfer comment: declined attempts    Ambulation/Gait               General Gait Details: declined attempts   Stairs             Wheelchair Mobility     Tilt Bed    Modified Rankin (Stroke Patients Only)       Balance Overall balance assessment: Needs assistance Sitting-balance support: No upper extremity supported, Feet supported Sitting balance-Leahy Scale: Normal                                      Cognition Arousal: Alert Behavior During Therapy: WFL for tasks assessed/performed Overall Cognitive Status: Within Functional Limits for tasks assessed                                          Exercises Other Exercises Other Exercises: Supine R LE therex, 1x15, act ROM: ankle pumps, heel slides, hip abduct/adduct, SLR, TKE (emphasis on gravity-assisted flexion stretching) Other Exercises: Unsupported sitting, R LE heel slides on floor (emphasis on knee flexion), 1x15 Other Exercises: Lateral scooting edge of bed, close sup.  Good UB strength, good clearance of buttocks from seating surface; limited active use of bilat LEs for WBing with task.  Anticipate adequate ability for scoot/squat  pivot from bed/chair, but patient declines this date.    General Comments        Pertinent Vitals/Pain Pain Assessment Pain Assessment: Faces Faces Pain Scale: Hurts little more Pain Location: R > L knee Pain Descriptors / Indicators: Aching, Grimacing Pain Intervention(s): Limited activity within patient's tolerance, Monitored during session, Repositioned    Home Living                          Prior Function            PT Goals (current goals can now be found in the care plan section) Acute Rehab PT Goals Patient Stated Goal: for R knee to feel better PT Goal Formulation: With patient Time For Goal Achievement: 08/05/23 Potential to  Achieve Goals: Fair Progress towards PT goals: Progressing toward goals    Frequency    Min 1X/week      PT Plan Current plan remains appropriate    Co-evaluation              AM-PAC PT "6 Clicks" Mobility   Outcome Measure  Help needed turning from your back to your side while in a flat bed without using bedrails?: A Little Help needed moving from lying on your back to sitting on the side of a flat bed without using bedrails?: A Little Help needed moving to and from a bed to a chair (including a wheelchair)?: A Lot Help needed standing up from a chair using your arms (e.g., wheelchair or bedside chair)?: Total Help needed to walk in hospital room?: Total Help needed climbing 3-5 steps with a railing? : Total 6 Click Score: 11    End of Session   Activity Tolerance: Patient tolerated treatment well;Patient limited by pain Patient left: in bed;with call bell/phone within reach;with bed alarm set Nurse Communication: Mobility status PT Visit Diagnosis: Other abnormalities of gait and mobility (R26.89);Muscle weakness (generalized) (M62.81);Difficulty in walking, not elsewhere classified (R26.2);Pain Pain - Right/Left: Right Pain - part of body: Knee     Time: 1040-1145 PT Time Calculation (min) (ACUTE ONLY): 65 min  Charges:    $Therapeutic Exercise: 23-37 mins $Therapeutic Activity: 23-37 mins PT General Charges $$ ACUTE PT VISIT: 1 Visit                     Briceyda Abdullah H. Manson Passey, PT, DPT, NCS 07/13/23, 1:55 PM [phone]
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
## 2023-07-14 DIAGNOSIS — M009 Pyogenic arthritis, unspecified: Secondary | ICD-10-CM | POA: Diagnosis not present

## 2023-07-14 NOTE — Progress Notes (Signed)
PT Cancellation Note  Patient Details Name: Martin [name] MRN: [id number] DOB: 1956-06-06   Cancelled Treatment:    Reason Eval/Treat Not Completed: Patient declined, no reason specified (Treatment session attempted at time pre-arranged with patient, ASL interpreter and therapist. Patient sleeping upon arrival, but awakens to light touch.  Declines participation with session despite encouragement from therapist, stating that "I need to rest and it's my choice".  Did request to speak with CSW while interpreter present; phone call placed and request made. Deliliah present at bedside as therapist leaving.  Will continue efforts on Monday; patient aware and voices understanding of plan.)  Seabron Iannello H. Manson Passey, PT, DPT, NCS 07/14/23, 4:48 PM [phone]

## 2023-07-14 NOTE — TOC Progression Note (Signed)
Transition of Care [facility]) - Progression Note    Patient Details  Name: Martin [name] MRN: [id number] Date of Birth: 1956/02/16  [facility] of [facility]) CM/SW Contact  Marlowe Sax, RN Phone Number: 07/14/2023, 4:22 PM  Clinical Narrative:     Met with the Patient and interpreter in the room, He asked me if I could help him find an apartment and he stated that he knows that he qualifies for assistance Thru deaf and blind association  I called Kelle at [facility] at [phone] I inquired if there is any companies that can help him No agency would be allowed to do things on his behalf from state agencies thru state employees He has refused to go to another Skilled Nursing Agency, I explained that I can not help him find an apartment She will send me email with agency numbers And send emails introducing me and the patient needs to each agency  Expected Discharge Plan:  (TBD) Barriers to Discharge: Continued Medical Work up  Expected Discharge Plan and Services                                               Social Determinants of Health (SDOH) Interventions SDOH Screenings   Tobacco Use: Low Risk  (04/29/2023)    Readmission Risk Interventions     No data to display

## 2023-07-14 NOTE — Plan of Care (Signed)
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
## 2023-07-15 DIAGNOSIS — M009 Pyogenic arthritis, unspecified: Secondary | ICD-10-CM | POA: Diagnosis not present

## 2023-07-15 NOTE — Plan of Care (Signed)

## 2023-07-15 NOTE — Plan of Care (Signed)
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
## 2023-07-16 DIAGNOSIS — M009 Pyogenic arthritis, unspecified: Secondary | ICD-10-CM | POA: Diagnosis not present

## 2023-07-16 NOTE — Plan of Care (Signed)
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
## 2023-07-17 DIAGNOSIS — M009 Pyogenic arthritis, unspecified: Secondary | ICD-10-CM | POA: Diagnosis not present

## 2023-07-17 NOTE — Care Management Important Message (Signed)
Important Message  Patient Details  Name: Martin [name] MRN: [id number] Date of Birth: [birth year]   Important Message Given:  Yes - Medicare IM     Bernadette Hoit 07/17/2023, 3:28 PM
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
## 2023-07-17 NOTE — Plan of Care (Signed)

## 2023-07-18 DIAGNOSIS — M009 Pyogenic arthritis, unspecified: Secondary | ICD-10-CM | POA: Diagnosis not present

## 2023-07-18 NOTE — Plan of Care (Signed)

## 2023-07-18 NOTE — TOC Progression Note (Addendum)
Transition of Care [facility]) - Progression Note    Patient Details  Name: Martin [name] MRN: [id number] Date of Birth: 1955/12/15  Transition of Care [facility]) CM/SW Contact  Marlowe Sax, RN Phone Number: 07/18/2023, 11:57 AM  Clinical Narrative:    [facility] at [phone]  Left a secure VM, No email was received with agencies requested on Friday I met with the patient at the bedside with the Interpreter in the room, the patient is wanting a list of low income housing in [place]< I explained that he will have to make the arrangements on his own but I would provide him with the list Compiled a list of all apartments in [place] and provided to the patient  Expected Discharge Plan:  (TBD) Barriers to Discharge: Continued Medical Work up  Expected Discharge Plan and Services                                               Social Determinants of Health (SDOH) Interventions SDOH Screenings   Tobacco Use: Low Risk  (04/29/2023)    Readmission Risk Interventions     No data to display

## 2023-07-18 NOTE — Plan of Care (Signed)
  Problem: Health Behavior/Discharge Planning: Goal: Ability to manage health-related needs will improve Outcome: Progressing   Problem: Clinical Measurements: Goal: Ability to maintain clinical measurements within normal limits will improve Outcome: Progressing Goal: Cardiovascular complication will be avoided Outcome: Progressing   Problem: Activity: Goal: Risk for activity intolerance will decrease Outcome: Progressing   Problem: Nutrition: Goal: Adequate nutrition will be maintained Outcome: Progressing   Problem: Coping: Goal: Level of anxiety will decrease Outcome: Progressing   Problem: Elimination: Goal: Will not experience complications related to urinary retention Outcome: Progressing   Problem: Pain Managment: Goal: General experience of comfort will improve Outcome: Progressing
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
## 2023-07-19 DIAGNOSIS — M009 Pyogenic arthritis, unspecified: Secondary | ICD-10-CM | POA: Diagnosis not present

## 2023-07-19 NOTE — Plan of Care (Signed)
  Problem: Education: Goal: Knowledge of General Education information will improve Description: Including pain rating scale, medication(s)/side effects and non-pharmacologic comfort measures Outcome: Adequate for Discharge   Problem: Health Behavior/Discharge Planning: Goal: Ability to manage health-related needs will improve Outcome: Adequate for Discharge   Problem: Clinical Measurements: Goal: Ability to maintain clinical measurements within normal limits will improve Outcome: Adequate for Discharge Goal: Will remain free from infection Outcome: Adequate for Discharge Goal: Cardiovascular complication will be avoided Outcome: Adequate for Discharge   Problem: Activity: Goal: Risk for activity intolerance will decrease Outcome: Adequate for Discharge   Problem: Nutrition: Goal: Adequate nutrition will be maintained Outcome: Adequate for Discharge   Problem: Coping: Goal: Level of anxiety will decrease Outcome: Adequate for Discharge   Problem: Elimination: Goal: Will not experience complications related to bowel motility Outcome: Adequate for Discharge Goal: Will not experience complications related to urinary retention Outcome: Adequate for Discharge   Problem: Pain Managment: Goal: General experience of comfort will improve Outcome: Adequate for Discharge   Problem: Safety: Goal: Ability to remain free from injury will improve Outcome: Adequate for Discharge   Problem: Skin Integrity: Goal: Risk for impaired skin integrity will decrease Outcome: Adequate for Discharge

## 2023-07-19 NOTE — Progress Notes (Addendum)
Patient refuses AM vitals  Once interpreter was present, patient allowed for staff to get vital signs.

## 2023-07-19 NOTE — Progress Notes (Signed)
PT Cancellation Note  Patient Details Name: Martin [name] MRN: [id number] DOB: 04-02-1956   Cancelled Treatment:     PT attempt. In-person interpreter not present. Attempted communication via whiteboard however pt requested to be left alone to rest. Acute PT will continue to follow per POC. Will attempt tomorrow when in-person interpreter returns.    Rushie Chestnut 07/19/2023, 1:36 PM
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
## 2023-07-19 NOTE — Plan of Care (Signed)

## 2023-07-19 NOTE — Progress Notes (Signed)
Patient refuses PM vitals.

## 2023-07-20 DIAGNOSIS — M009 Pyogenic arthritis, unspecified: Secondary | ICD-10-CM | POA: Diagnosis not present

## 2023-07-20 LAB — CULTURE, FUNGUS WITHOUT SMEAR

## 2023-07-20 NOTE — Progress Notes (Signed)
Patient refuses AM vitals; patient also refuses AM medications except for eye drops.

## 2023-07-20 NOTE — Progress Notes (Signed)
Kennyth Arnold advises interpreter will likely be by between 1500-1600 today
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
## 2023-07-20 NOTE — Plan of Care (Signed)

## 2023-07-20 NOTE — Plan of Care (Signed)
  Problem: Education: Goal: Knowledge of General Education information will improve Description: Including pain rating scale, medication(s)/side effects and non-pharmacologic comfort measures Outcome: Progressing   Problem: Nutrition: Goal: Adequate nutrition will be maintained Outcome: Progressing   

## 2023-07-21 DIAGNOSIS — M009 Pyogenic arthritis, unspecified: Secondary | ICD-10-CM | POA: Diagnosis not present

## 2023-07-21 MED ORDER — HYDROCODONE-ACETAMINOPHEN 5-325 MG PO TABS: 1.0000 | ORAL_TABLET | Freq: Four times a day (QID) | ORAL | Status: DC | PRN

## 2023-07-21 NOTE — TOC Progression Note (Signed)
Transition of Care [facility]) - Progression Note    Patient Details  Name: Martin [name] MRN: [id number] Date of Birth: [birth year]  [facility] CM/SW Contact  Marlowe Sax, RN Phone Number: 07/21/2023, 11:59 AM  Clinical Narrative:    The patient prefers to go to an apartment, he understands that I am not able to locate an apartment for him, he has not started calling apartments himself, he was provided with a list of apartments 2 days ago, I reminded him that it is important to go ahead and start contacting them to arrange housing, I resent the bed search for a long term bed for him as well   Expected Discharge Plan:  (TBD) Barriers to Discharge: Continued Medical Work up  Expected Discharge Plan and Services                                               Social Determinants of Health (SDOH) Interventions SDOH Screenings   Tobacco Use: Low Risk  (04/29/2023)    Readmission Risk Interventions     No data to display
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
## 2023-07-21 NOTE — Plan of Care (Signed)

## 2023-07-21 NOTE — Progress Notes (Signed)
PT Cancellation Note  Patient Details Name: Martin [name] MRN: [id number] DOB: August 07, 1956   Cancelled Treatment:    Reason Eval/Treat Not Completed: Patient declined, no reason specified (Treatment session coordinated with ASL interpreter, Amy. Patient declining participation with session this date, despite encouragement. Will continue efforts next date as appropriate; session scheduled 0830/0900)   [name] H. Manson Passey, PT, DPT, NCS 07/21/23, 3:17 PM [phone]

## 2023-07-22 NOTE — Plan of Care (Signed)

## 2023-07-22 NOTE — Plan of Care (Signed)

## 2023-07-23 DIAGNOSIS — M009 Pyogenic arthritis, unspecified: Secondary | ICD-10-CM | POA: Diagnosis not present

## 2023-07-23 NOTE — Progress Notes (Signed)
Mobility Specialist - Progress Note     07/23/23 1605  Mobility  Activity Turned to right side;Turned to left side;Turned to back - supine  Range of Motion/Exercises Active;Right arm;Left arm;Right leg;Left leg  LLE Weight Bearing WBAT  Activity Response Tolerated well  Mobility Referral Yes  $Mobility charge 1 Mobility  Mobility Specialist Start Time (ACUTE ONLY) 1430  Mobility Specialist Stop Time (ACUTE ONLY) 1600  Mobility Specialist Time Calculation (min) (ACUTE ONLY) 90 min   Pt resting in bed eating upon entry. Pt performed hygiene task and shaving modI with only minimal help fr+bed and raised legs x5 times during session. Pt left with needs in reach. Vitals recorded by NT.   Johnathan Hausen Mobility Specialist 07/23/23, 4:16 PM
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
## 2023-07-24 ENCOUNTER — Inpatient Hospital Stay: Payer: Medicare Other

## 2023-07-24 DIAGNOSIS — M009 Pyogenic arthritis, unspecified: Secondary | ICD-10-CM | POA: Diagnosis not present

## 2023-07-24 MED ORDER — DORZOLAMIDE HCL 2 % OP SOLN: 1.0000 [drp] | Freq: Every day | OPHTHALMIC | Status: DC

## 2023-07-24 MED ORDER — DORZOLAMIDE HCL 2 % OP SOLN
1.0000 [drp] | Freq: Two times a day (BID) | OPHTHALMIC | Status: DC
  Administered 2023-07-25 – 2024-02-13 (×327): 1 [drp] via OPHTHALMIC
  Filled 2023-07-24 (×8): qty 10

## 2023-07-24 MED ORDER — DORZOLAMIDE HCL 2 % OP SOLN
1.0000 [drp] | Freq: Every day | OPHTHALMIC | Status: DC
  Filled 2023-07-24: qty 10

## 2023-07-24 NOTE — Progress Notes (Signed)
ASL Interpreter Maritsa called to ask if patient needed interpreter for tomorrow. Advised yes, patient will need interpreter for tomorrow. Maritsa asked what time should and interpreter come, advised around 11 am.

## 2023-07-24 NOTE — TOC Progression Note (Signed)
Transition of Care [facility]) - Progression Note    Patient Details  Name: Martin [name] MRN: [id number] Date of Birth: October 09, 1956  Transition of Care [facility]) CM/SW Contact  Marlowe Sax, RN Phone Number: 07/24/2023, 12:53 PM  Clinical Narrative:     I went in to see the patient, touched him lightly on the right shoulder. He motioned for me to leave him alone, I tried writing on his white dry erase board and he motioned for me to leave and then rolled over to ignore me  Expected Discharge Plan:  (TBD) Barriers to Discharge: Continued Medical Work up  Expected Discharge Plan and Services                                               Social Determinants of Health (SDOH) Interventions SDOH Screenings   Tobacco Use: Low Risk  (04/29/2023)    Readmission Risk Interventions     No data to display

## 2023-07-24 NOTE — Progress Notes (Signed)
The ASL interpreter returned to try and communicate with patient. Patient signaled hand motion to indicate he wanted eye drops. Administered dorzolamide to patient. Patient refused any other communication attempts. MD notified.

## 2023-07-24 NOTE — Progress Notes (Addendum)
Patient is refusing to communicate with nurse preventing me from doing an assessment, take vitals, administering medications, and telling him that he has food on his table. Nurse has made several attempts to communicate with patient via writing on his dry erase board but patient signaled for nurse to leave him alone. The interpreter came and he wouldn't speak with her either. Interpreter stated she would come back later and try again. MD notified.

## 2023-07-24 NOTE — Plan of Care (Signed)

## 2023-07-24 NOTE — Plan of Care (Signed)
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
## 2023-07-25 DIAGNOSIS — N451 Epididymitis: Secondary | ICD-10-CM | POA: Diagnosis not present
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
## 2023-07-25 NOTE — Plan of Care (Signed)

## 2023-07-25 NOTE — TOC Progression Note (Signed)
Transition of Care [facility]) - Progression Note    Patient Details  Name: Martin [name] MRN: [id number] Date of Birth: June 12, 1956  Transition [facility]) CM/SW Contact  Marlowe Sax, RN Phone Number: 07/25/2023, 11:37 AM  Clinical Narrative:     Reached out to Director and [facility] Supervisor asking for assistance in placement for this patient, He wants to go to an apartment however does not have an apartment and is not taking steps to get an apartment, There are no facilities willing to accept the patient and he is refusing to go to a long term facility even if one is found  Expected Discharge Plan:  (TBD) Barriers to Discharge: Continued Medical Work up  Expected Discharge Plan and Services                                               Social Determinants of Health (SDOH) Interventions SDOH Screenings   Tobacco Use: Low Risk  (04/29/2023)    Readmission Risk Interventions     No data to display

## 2023-07-25 NOTE — Plan of Care (Signed)

## 2023-07-25 NOTE — Care Management Important Message (Signed)
Important Message  Patient Details  Name: Martin [name] MRN: [id number] Date of Birth: 05/24/1956   Important Message Given:  Yes - Medicare IM     Bernadette Hoit 07/25/2023, 11:34 AM

## 2023-07-26 DIAGNOSIS — F4323 Adjustment disorder with mixed anxiety and depressed mood: Secondary | ICD-10-CM | POA: Diagnosis not present

## 2023-07-26 DIAGNOSIS — Z933 Colostomy status: Secondary | ICD-10-CM | POA: Diagnosis not present
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
## 2023-07-26 NOTE — Progress Notes (Signed)
Patient refused vitals.

## 2023-07-26 NOTE — Plan of Care (Signed)

## 2023-07-26 NOTE — Plan of Care (Signed)

## 2023-07-26 NOTE — Progress Notes (Signed)
PT Cancellation Note  Patient Details Name: Martin [name] MRN: [id number] DOB: January 11, 1956   Cancelled Treatment:    Reason Eval/Treat Not Completed: Fatigue/lethargy limiting ability to participate (Treatment session coordinated with ASL interpreter.  Patient sleeping upon arrival.  Awakens to light touch, but declines session despite encouragement, stating he "needs sleep".  Unable to redirect.  Will re-schedule with ASL interpreter for next date.)   Enyah Moman H. Manson Passey, PT, DPT, NCS 07/26/23, 10:27 AM [phone]
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
## 2023-07-27 DIAGNOSIS — D509 Iron deficiency anemia, unspecified: Secondary | ICD-10-CM | POA: Insufficient documentation

## 2023-07-27 DIAGNOSIS — D500 Iron deficiency anemia secondary to blood loss (chronic): Secondary | ICD-10-CM | POA: Diagnosis not present

## 2023-07-27 DIAGNOSIS — F4323 Adjustment disorder with mixed anxiety and depressed mood: Secondary | ICD-10-CM | POA: Diagnosis not present

## 2023-07-27 DIAGNOSIS — M009 Pyogenic arthritis, unspecified: Secondary | ICD-10-CM | POA: Diagnosis not present

## 2023-07-27 NOTE — Progress Notes (Signed)
PT Cancellation Note  Patient Details Name: Martin [name] MRN: [id number] DOB: January 24, 1956   Cancelled Treatment:    Reason Eval/Treat Not Completed:  (Planned treatment session attempted this AM (ASL interpreter present).  Patient declined participation with session; given choices of activities (patient-driven), but unable to redirect for participation.  Will continue efforts next date at later time in PM (seems to be patient preference), pending ASL interpreter availability).  Woodie Trusty H. Manson Passey, PT, DPT, NCS 07/27/23, 10:15 AM [phone]

## 2023-07-27 NOTE — Plan of Care (Signed)

## 2023-07-27 NOTE — TOC Progression Note (Signed)
Transition of Care [facility]) - Progression Note    Patient Details  Name: Martin [name] MRN: [id number] Date of Birth: 03-15-1956  [facility]) CM/SW Contact  Marlowe Sax, RN Phone Number: 07/27/2023, 12:02 PM  Clinical Narrative:    I met with the patient with the interpreter in the room, He stated that he will start calling around today about the apartments, using the list I provided to him in [place], I explained the importance of doing this and he agreed, We will touch base tomorrow and see what progress he made   Expected Discharge Plan:  (TBD) Barriers to Discharge: Continued Medical Work up  Expected Discharge Plan and Services                                               Social Determinants of Health (SDOH) Interventions SDOH Screenings   Tobacco Use: Low Risk  (04/29/2023)    Readmission Risk Interventions     No data to display
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
## 2023-07-27 NOTE — Plan of Care (Signed)
  Problem: Education: Goal: Knowledge of General Education information will improve Description: Including pain rating scale, medication(s)/side effects and non-pharmacologic comfort measures Outcome: Progressing   Problem: Health Behavior/Discharge Planning: Goal: Ability to manage health-related needs will improve Outcome: Progressing   Problem: Nutrition: Goal: Adequate nutrition will be maintained Outcome: Progressing   Problem: Coping: Goal: Level of anxiety will decrease Outcome: Progressing   Problem: Elimination: Goal: Will not experience complications related to bowel motility Outcome: Progressing   Problem: Pain Managment: Goal: General experience of comfort will improve Outcome: Progressing   Problem: Safety: Goal: Ability to remain free from injury will improve Outcome: Progressing

## 2023-07-28 DIAGNOSIS — F4323 Adjustment disorder with mixed anxiety and depressed mood: Secondary | ICD-10-CM | POA: Diagnosis not present

## 2023-07-28 DIAGNOSIS — H548 Legal blindness, as defined in USA: Secondary | ICD-10-CM | POA: Diagnosis not present

## 2023-07-28 DIAGNOSIS — M009 Pyogenic arthritis, unspecified: Secondary | ICD-10-CM | POA: Diagnosis not present

## 2023-07-28 NOTE — Progress Notes (Signed)
PT Cancellation Note  Patient Details Name: Samba Madeira MRN: [id number] DOB: 02-Jun-1956   Cancelled Treatment:    Reason Eval/Treat Not Completed:  (Treatment session attempted. ASL interpreter present at bedside, assisting with DC planning discussion (with TOC).  Patient continues to decline participation with therapy, stating "I'm tired".  Reminded that he has declined all week and reinforced expectation of continued participation with therapy when able to coordinate with ASL interpreters).  Jahmiyah Dullea H. Manson Passey, PT, DPT, NCS 07/28/23, 2:31 PM [phone]
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
## 2023-07-28 NOTE — Plan of Care (Signed)
[facility] Tonsillectomy/Adenoidectomy, Postoperative PEDS care plan entered in error.
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
## 2023-07-28 NOTE — Consult Note (Cosign Needed Addendum)
Attempted to complete consultation for capacity. The interpreter requested the consultation be rescheduled for tomorrow at 11AM. She said it would be difficult to first obtain his permission for the interview and then lengthy to perform the assessment related to his communication challenges.   Nanine Means, PMHNP

## 2023-07-28 NOTE — Progress Notes (Signed)
Pt's interpreter is going to be here tomorrow at 11:00 am for psych competency.

## 2023-07-28 NOTE — Plan of Care (Signed)
  Problem: Education: Goal: Knowledge of General Education information will improve Description: Including pain rating scale, medication(s)/side effects and non-pharmacologic comfort measures Outcome: Adequate for Discharge   Problem: Health Behavior/Discharge Planning: Goal: Ability to manage health-related needs will improve Outcome: Adequate for Discharge   Problem: Clinical Measurements: Goal: Ability to maintain clinical measurements within normal limits will improve Outcome: Adequate for Discharge Goal: Will remain free from infection Outcome: Adequate for Discharge Goal: Cardiovascular complication will be avoided Outcome: Adequate for Discharge   Problem: Activity: Goal: Risk for activity intolerance will decrease Outcome: Adequate for Discharge   Problem: Nutrition: Goal: Adequate nutrition will be maintained Outcome: Adequate for Discharge   Problem: Coping: Goal: Level of anxiety will decrease Outcome: Adequate for Discharge   Problem: Elimination: Goal: Will not experience complications related to bowel motility Outcome: Adequate for Discharge Goal: Will not experience complications related to urinary retention Outcome: Adequate for Discharge   Problem: Pain Managment: Goal: General experience of comfort will improve Outcome: Adequate for Discharge   Problem: Safety: Goal: Ability to remain free from injury will improve Outcome: Adequate for Discharge   Problem: Skin Integrity: Goal: Risk for impaired skin integrity will decrease Outcome: Adequate for Discharge   Problem: Education: Goal: Understanding of post-operative needs will improve Outcome: Adequate for Discharge Goal: Individualized Educational Video(s) Outcome: Adequate for Discharge   Problem: Clinical Measurements: Goal: Postoperative complications will be avoided or minimized Outcome: Adequate for Discharge   Problem: Respiratory: Goal: Will regain and/or maintain adequate  ventilation Outcome: Adequate for Discharge

## 2023-07-28 NOTE — Plan of Care (Signed)

## 2023-07-29 DIAGNOSIS — F4323 Adjustment disorder with mixed anxiety and depressed mood: Secondary | ICD-10-CM | POA: Diagnosis not present

## 2023-07-29 DIAGNOSIS — Z008 Encounter for other general examination: Secondary | ICD-10-CM | POA: Diagnosis not present

## 2023-07-29 DIAGNOSIS — H9193 Unspecified hearing loss, bilateral: Secondary | ICD-10-CM | POA: Diagnosis not present

## 2023-07-29 DIAGNOSIS — H548 Legal blindness, as defined in USA: Secondary | ICD-10-CM | POA: Diagnosis not present

## 2023-07-29 NOTE — Plan of Care (Signed)
  Problem: Clinical Measurements: Goal: Ability to maintain clinical measurements within normal limits will improve Outcome: Progressing Goal: Will remain free from infection Outcome: Progressing Goal: Cardiovascular complication will be avoided Outcome: Progressing   Problem: Activity: Goal: Risk for activity intolerance will decrease Outcome: Progressing   Problem: Nutrition: Goal: Adequate nutrition will be maintained Outcome: Progressing   Problem: Coping: Goal: Level of anxiety will decrease Outcome: Progressing   Problem: Elimination: Goal: Will not experience complications related to bowel motility Outcome: Progressing Goal: Will not experience complications related to urinary retention Outcome: Progressing   Problem: Pain Managment: Goal: General experience of comfort will improve Outcome: Progressing   Problem: Safety: Goal: Ability to remain free from injury will improve Outcome: Progressing   Problem: Skin Integrity: Goal: Risk for impaired skin integrity will decrease Outcome: Progressing

## 2023-07-29 NOTE — Progress Notes (Signed)
Pt has refused vital signs and po medications. Interpreter utilized to communicate with pt. Pt refused to acknowledge her presence by keeping eyes closed.

## 2023-07-29 NOTE — Consult Note (Signed)
[facility] Face-to-Face Psychiatry Consult   Reason for Consult:  Capacity to make medical decisions Referring Physician:  Dr. Chipper Herb Patient Identification: Martin [name] MRN:  [id number] Principal Diagnosis: Septic joint of right knee joint [facility]) Diagnosis:  Principal Problem:   Septic joint of right knee joint (HCC) Active Problems:   Legally blind   Deaf   Ogilvie's syndrome   Colostomy status (HCC)   Iron deficiency anemia   Total Time spent with patient: 1 hour  Subjective:   Martin [name] is a 67 y.o. male patient admitted with infected knee, placement issue.  HPI:  67 yo male admitted to the medical floor for an infection of the knee, who is also deaf and blind.  The assessment took place with the ASL interpreter who was in person at his bedside.  He is able to see her hands when placed directly in his vision and close to his face with tapping of his hand for reinforcement of communication.  The interpreter has worked with him for many years.  The client did communicate his name and date of birth correctly.  Based on his limitations, the interpreter let the team know that he would not know the date and time.  She did provide him options of the type of facility he was in which he refused to answer and wanted to get to the other questions.  He was fixated on going to a group home in [place] despite the interpreter letting him know there was absolutely nothing in the area, only nursing home facilities that he was strongly against.  She proposed a group home that she discovered through the deaf community in [place] that she and his other interpreter, [name], could continue their services and attend appointments with him.  There is also another deaf client there who is around his same age who could communicate with him.  He was adamantly against this and other options.  Martin [name] needs extensive assistance in his care with his physical limitations including an ostomy and need for assistance to ambulate with  crutches or a walker.  He does not recognize these physical limitations.  Even when the interpreter let him know that if he did not agree to reasonable accommodations that the state would petition for his guardianship and he would not be able to decide things for himself including that they would then dictate where he would live.  He pushed her hands away and was disagreeable to make any other decision.  Titus often refuses care, like this morning, including vital signs.  Martin [name] does not have capacity to make sound medical decisions as he does not recognize the acuity of his illness/disabilities, nor his functional status.  He expresses a refusal of reasonable, appropriate placement options along with daily care frequently.  He often refuses care without rationales.  Martin [name] needs assistance with colostomy care, ADLs, eating, bathing, etc.  He was able to walk with a walker or crutches prior to his knee infection which is now being negatively affected by his refusal of PT for the past three days, further compromising his ambulating ability.  Martin [name] is not able to make reasonable and sound medical decisions at this time.  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)  Past Surgical History:  Procedure Laterality Date   EYE SURGERY Right    "relieved fluid pressure and cleaned it out"   KNEE ARTHROSCOPY Right 12/22/2015   Procedure: ARTHROSCOPY WASHOUT OF SEPTIC RIGHT KNEE;  Surgeon: Sheral Apley, MD;  Location: [facility] OR;  Service: Orthopedics;  Laterality: Right;   KNEE ARTHROSCOPY Right 06/24/2023   Procedure: ARTHROSCOPY KNEE  DEBRIDEMENT AND WASHOUT;  Surgeon: Deeann Saint, MD;  Location: [facility] ORS;  Service: Orthopedics;  Laterality: Right;   KNEE SURGERY Left 2015/2016   Family History:  Family  History  Family history unknown: Yes   Family Psychiatric  History: unknown Social History:  Social History   Substance and Sexual Activity  Alcohol Use No     Social History   Substance and Sexual Activity  Drug Use No    Social History   Socioeconomic History   Marital status: Married    Spouse name: Not on file   Number of children: Not on file   Years of education: Not on file   Highest education level: Not on file  Occupational History   Not on file  Tobacco Use   Smoking status: Never   Smokeless tobacco: Never  Substance and Sexual Activity   Alcohol use: No   Drug use: No   Sexual activity: Yes  Other Topics Concern   Not on file  Social History Narrative   Not on file   Social Determinants of Health   Financial Resource Strain: Not on file  Food Insecurity: Not on file  Transportation Needs: Not on file  Physical Activity: Not on file  Stress: Not on file  Social Connections: Not on file    Allergies:   Allergies  Allergen Reactions   Ivp Dye [Iodinated Contrast Media] Other (See Comments)    Pt reports he has seizures.    Labs: No results found for this or any previous visit (from the past 48 hour(s)).  Current Facility-Administered Medications  Medication Dose Route Frequency Provider Last Rate Last Admin   acetaminophen (TYLENOL) tablet 650 mg  650 mg Oral Q6H PRN Deeann Saint, MD   650 mg at 07/11/23 1927   Or   acetaminophen (TYLENOL) suppository 650 mg  650 mg Rectal Q6H PRN Deeann Saint, MD       alum & mag hydroxide-simeth (MAALOX/MYLANTA) 200-200-20 MG/5ML suspension 30 mL  30 mL Oral Q6H PRN Deeann Saint, MD       ascorbic acid (VITAMIN C) tablet 500 mg  500 mg Oral Q24H Hunt, Madison H, RPH   500 mg at 07/25/23 0900   diclofenac Sodium (VOLTAREN) 1 % topical gel 2 g  2 g Topical Q6H PRN Deeann Saint, MD   2 g at 07/20/23 1634   dorzolamide (TRUSOPT) 2 % ophthalmic solution 1 drop  1 drop Both Eyes BID Jaynie Bream, RPH    1 drop at 07/28/23 1725   feeding supplement (ENSURE ENLIVE / ENSURE PLUS) liquid 237 mL  237 mL Oral BID BM Deeann Saint, MD   237 mL at 07/25/23 0900   hydrOXYzine (ATARAX) tablet 25 mg  25 mg Oral TID PRN Deeann Saint, MD   25 mg at 04/15/23 0327   iron polysaccharides (NIFEREX) capsule 150 mg  150 mg Oral Q24H Hunt, Madison H, RPH   150 mg at 07/24/23 1408   latanoprost (XALATAN) 0.005 % ophthalmic solution 1 drop  1 drop Both Eyes QHS Deeann Saint, MD   1 drop at 07/28/23 2152  levothyroxine (SYNTHROID) tablet 125 mcg  125 mcg Oral Q24H Hunt, Madison H, RPH   125 mcg at 07/25/23 0900   lidocaine (PF) (XYLOCAINE) 1 % injection 5 mL  5 mL Other Once Deeann Saint, MD       metoCLOPramide (REGLAN) tablet 5-10 mg  5-10 mg Oral Q8H PRN Deeann Saint, MD       Or   metoCLOPramide (REGLAN) injection 5-10 mg  5-10 mg Intravenous Q8H PRN Deeann Saint, MD       midodrine (PROAMATINE) tablet 10 mg  10 mg Oral TID WC Gillis Santa, MD   10 mg at 07/26/23 0735   OLANZapine zydis (ZYPREXA) disintegrating tablet 5 mg  5 mg Oral BID PRN Deeann Saint, MD       Or   OLANZapine [facility]) injection 5 mg  5 mg Intramuscular BID PRN Deeann Saint, MD       ondansetron [facility]) tablet 4 mg  4 mg Oral Q6H PRN Deeann Saint, MD       Or   ondansetron [facility]) injection 4 mg  4 mg Intravenous Q6H PRN Deeann Saint, MD       pantoprazole (PROTONIX) EC tablet 40 mg  40 mg Oral Q24H Hunt, Madison H, RPH   40 mg at 07/25/23 0901   polyethylene glycol (MIRALAX / GLYCOLAX) packet 17 g  17 g Oral Q24H Hunt, Madison H, RPH   17 g at 07/28/23 1032   polyvinyl alcohol (LIQUIFILM TEARS) 1.4 % ophthalmic solution 2 drop  2 drop Both Eyes PRN Deeann Saint, MD   2 drop at 07/17/23 1436   traZODone (DESYREL) tablet 50 mg  50 mg Oral QHS PRN Deeann Saint, MD   50 mg at 04/19/23 0028   trolamine salicylate (ASPERCREME) 10 % cream   Topical BID PRN Deeann Saint, MD   Given at 06/09/23 1157   Vitamin D  (Ergocalciferol) (DRISDOL) 1.25 MG (50000 UNIT) capsule 50,000 Units  50,000 Units Oral Q7 days Gillis Santa, MD        Musculoskeletal: Strength & Muscle Tone: decreased Gait & Station:  did not witness Patient leans: Right  Psychiatric Specialty Exam: Physical Exam Vitals and nursing note reviewed.  Constitutional:      Appearance: Normal appearance.  HENT:     Head: Normocephalic.     Nose: Nose normal.  Pulmonary:     Effort: Pulmonary effort is normal.  Musculoskeletal:     Cervical back: Normal range of motion.  Neurological:     General: No focal deficit present.     Mental Status: He is alert.     Review of Systems  HENT:  Positive for hearing loss.   Eyes:        Legally blind    Blood pressure 101/62, pulse 72, temperature 98.3 F (36.8 C), resp. rate 18, weight 60 kg, SpO2 96%.Body mass index is 17.45 kg/m.  General Appearance: Casual  Eye Contact:  Fair, turned toward voices  Speech:  Negative  Volume:   NA  Mood:  Irritable  Affect:  Blunt  Thought Process:  Linear  Orientation:  Person  Thought Content:  unreasonable  Suicidal Thoughts:  No  Homicidal Thoughts:  No  Memory:  UTA  Judgement:  Impaired  Insight:  Lacking  Psychomotor Activity:  Decreased  Concentration:  Concentration: Fair and Attention Span: Fair  Recall:  NA  Fund of Knowledge:  UTA  Language:  Sign language  Akathisia:  No  Handed:  Right  AIMS (if indicated):     Assets:  Communication Skills Leisure Time Resilience  ADL's:  Impaired  Cognition:  understood the questions  Sleep:        Physical Exam: Physical Exam Vitals and nursing note reviewed.  Constitutional:      Appearance: Normal appearance.  HENT:     Head: Normocephalic.     Nose: Nose normal.  Pulmonary:     Effort: Pulmonary effort is normal.  Musculoskeletal:     Cervical back: Normal range of motion.  Neurological:     General: No focal deficit present.     Mental Status: He is alert.     Review of Systems  HENT:  Positive for hearing loss.   Eyes:        Legally blind   Blood pressure 101/62, pulse 72, temperature 98.3 F (36.8 C), resp. rate 18, weight 60 kg, SpO2 96%. Body mass index is 17.45 kg/m.  Treatment Plan Summary: 67 yo male admitted to the medical floor for a knee infection, need for a new placement. Loyal is not able to make reasonable and sound medical decisions at this time based on his inability to recognize the acuity of his disabilities and functional status.  Frequent refusal of care, including PT, without reasonable rationales and consequences of his actions.  He also continually refuses appropriate placement options to meet his needs.  Disposition:  Group home or appropriate placement  Nanine Means, NP 07/29/2023 11:38 AM
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
## 2023-07-30 DIAGNOSIS — M009 Pyogenic arthritis, unspecified: Secondary | ICD-10-CM | POA: Diagnosis not present

## 2023-07-30 DIAGNOSIS — F4323 Adjustment disorder with mixed anxiety and depressed mood: Secondary | ICD-10-CM | POA: Diagnosis not present

## 2023-07-30 DIAGNOSIS — H548 Legal blindness, as defined in USA: Secondary | ICD-10-CM | POA: Diagnosis not present

## 2023-07-30 NOTE — Plan of Care (Signed)

## 2023-07-30 NOTE — Plan of Care (Signed)
Problem: Education: Goal: Knowledge of General Education information will improve Description: Including pain rating scale, medication(s)/side effects and non-pharmacologic comfort measures Outcome: Progressing   Problem: Clinical Measurements: Goal: Ability to maintain clinical measurements within normal limits will improve Outcome: Progressing Goal: Will remain free from infection Outcome: Progressing Goal: Cardiovascular complication will be avoided Outcome: Progressing   Problem: Nutrition: Goal: Adequate nutrition will be maintained Outcome: Progressing   Problem: Coping: Goal: Level of anxiety will decrease Outcome: Progressing   Problem: Elimination: Goal: Will not experience complications related to bowel motility Outcome: Progressing Goal: Will not experience complications related to urinary retention Outcome: Progressing   Problem: Pain Managment: Goal: General experience of comfort will improve Outcome: Progressing   Problem: Safety: Goal: Ability to remain free from injury will improve Outcome: Progressing   Problem: Skin Integrity: Goal: Risk for impaired skin integrity will decrease Outcome: Progressing
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
## 2023-07-30 NOTE — Progress Notes (Signed)
Patient refused vitals.

## 2023-07-31 DIAGNOSIS — F4323 Adjustment disorder with mixed anxiety and depressed mood: Secondary | ICD-10-CM | POA: Diagnosis not present

## 2023-07-31 DIAGNOSIS — H548 Legal blindness, as defined in USA: Secondary | ICD-10-CM | POA: Diagnosis not present

## 2023-07-31 DIAGNOSIS — Z789 Other specified health status: Secondary | ICD-10-CM | POA: Insufficient documentation

## 2023-07-31 DIAGNOSIS — M009 Pyogenic arthritis, unspecified: Secondary | ICD-10-CM | POA: Diagnosis not present

## 2023-07-31 MED ORDER — WHITE PETROLATUM EX OINT
TOPICAL_OINTMENT | CUTANEOUS | Status: DC | PRN
  Filled 2023-07-31: qty 5

## 2023-07-31 NOTE — TOC Progression Note (Signed)
Transition of Care [facility]) - Progression Note    Patient Details  Name: Martin [name] MRN: [id number] Date of Birth: July 21, 1956  [facility] CM/SW Contact  Marlowe Sax, RN Phone Number: 07/31/2023, 4:23 PM  Clinical Narrative:    Met with the patient along with the interpreter to discuss plans, there are no group homes that will be able to meet his needs as of yet, I reached out to his niece at his request to try and get his nephew in [place] phone number niece [phone] Joetta Manners, no answer no VM to leave a message   Expected Discharge Plan:  (TBD) Barriers to Discharge: Continued Medical Work up  Expected Discharge Plan and Services                                               Social Determinants of Health (SDOH) Interventions SDOH Screenings   Tobacco Use: Low Risk  (04/29/2023)    Readmission Risk Interventions     No data to display
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
## 2023-07-31 NOTE — Plan of Care (Signed)

## 2023-07-31 NOTE — Progress Notes (Signed)
PT Cancellation Note  Patient Details Name: Martin [name] MRN: [id number] DOB: Nov 01, 1955   Cancelled Treatment:    Reason Eval/Treat Not Completed: Patient declined, no reason specified (Treatment session coordinated with ASL interpreter.  Patient refusing participation with all tasks. Offered choice of activities, reviewed role of mobility and status updates for discharge planning process, encouraged as able; patient continues to decline participation, stating "it's my choice", and feels he will do the things necessary when he gets to his next level of care. Unable to redirect or reason with further.  Will continue efforts next date.)   Juan Olthoff H. Manson Passey, PT, DPT, NCS 07/31/23, 1:18 PM [phone]

## 2023-07-31 NOTE — Plan of Care (Signed)
  Problem: Education: Goal: Knowledge of General Education information will improve Description: Including pain rating scale, medication(s)/side effects and non-pharmacologic comfort measures Outcome: Progressing   Problem: Clinical Measurements: Goal: Ability to maintain clinical measurements within normal limits will improve Outcome: Progressing   Problem: Elimination: Goal: Will not experience complications related to bowel motility Outcome: Progressing   Problem: Pain Managment: Goal: General experience of comfort will improve Outcome: Progressing   Problem: Safety: Goal: Ability to remain free from injury will improve Outcome: Progressing   

## 2023-08-01 DIAGNOSIS — F4323 Adjustment disorder with mixed anxiety and depressed mood: Secondary | ICD-10-CM | POA: Diagnosis not present

## 2023-08-01 DIAGNOSIS — I9589 Other hypotension: Secondary | ICD-10-CM | POA: Diagnosis not present

## 2023-08-01 DIAGNOSIS — I959 Hypotension, unspecified: Secondary | ICD-10-CM | POA: Insufficient documentation

## 2023-08-01 MED ORDER — LORAZEPAM 2 MG/ML IJ SOLN: 0.5000 mg | Freq: Four times a day (QID) | INTRAMUSCULAR | Status: DC | PRN

## 2023-08-01 MED ORDER — LORAZEPAM 0.5 MG PO TABS: 0.5000 mg | ORAL_TABLET | Freq: Four times a day (QID) | ORAL | Status: DC | PRN

## 2023-08-01 MED ORDER — SODIUM CHLORIDE 0.9 % IV BOLUS: 500.0000 mL | Freq: Once | INTRAVENOUS | Status: DC

## 2023-08-01 NOTE — Plan of Care (Signed)
  Problem: Education: Goal: Knowledge of General Education information will improve Description: Including pain rating scale, medication(s)/side effects and non-pharmacologic comfort measures Outcome: Not Progressing   Problem: Activity: Goal: Risk for activity intolerance will decrease Outcome: Not Progressing Problem: Elimination: Goal: Will not experience complications related to bowel motility Outcome: Progressing Goal: Will not experience complications related to urinary retention Outcome: Progressing   Problem: Pain Managment: Goal: General experience of comfort will improve Outcome: Progressing   Problem: Skin Integrity: Goal: Risk for impaired skin integrity will decrease Outcome: Progressing   Problem: Safety: Goal: Ability to remain free from injury will improve Outcome: Progressing

## 2023-08-01 NOTE — Progress Notes (Signed)
Interpreter at bedside x 2. Soft BP's. Patient refused most medications today. (Only eyedrops given) MD notified of refusal and Blood pressures. Will continue to monitor.  Sign Language Interpreter is available 24 hrs a day [phone] [name] or Amy )
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
## 2023-08-01 NOTE — TOC Progression Note (Signed)
Transition of Care [facility]) - Progression Note    Patient Details  Name: Martin [name] MRN: [id number] Date of Birth: December 05, 1955  Transition of Care [facility]) CM/SW Contact  Marlowe Sax, RN Phone Number: 08/01/2023, 4:20 PM  Clinical Narrative:    [place] DSS sent 2 individuals today to assess the patient, the interpreter was here at the time and the patient was not willing to respond to their questions, I asked if they thought that they would be seeking guardianship and they said they do not know yet   Expected Discharge Plan:  (TBD) Barriers to Discharge: Continued Medical Work up  Expected Discharge Plan and Services                                               Social Determinants of Health (SDOH) Interventions SDOH Screenings   Tobacco Use: Low Risk  (04/29/2023)    Readmission Risk Interventions     No data to display

## 2023-08-01 NOTE — TOC Progression Note (Addendum)
Transition of Care [facility]) - Progression Note    Patient Details  Name: Martin [name] MRN: [id number] Date of Birth: [birth year]  Transition of Care [facility]) CM/SW Contact  Marlowe Sax, RN Phone Number: 08/01/2023, 10:54 AM  Clinical Narrative:    The interpreter [name] is at the bedside, the patient is sleeping and does not want to talk to me, I called APS hotline intake department, and made a referral for possible guardianship, The psych eval deemed him incapable of making medical decisions on Sat 10/19 He has the colostomy, deaf and blind,  Wheelchair bound   Met with the patient and the interpreter and the Unit Ass director to explain to him that his BP is low and that they want to give him medication to bring it up, I asked if he understood what we are saying and he said he understood but was not willing to take the medication  Barriers to Discharge: Continued Medical Work up  Expected Discharge Plan and Services                                               Social Determinants of Health (SDOH) Interventions SDOH Screenings   Tobacco Use: Low Risk  (04/29/2023)    Readmission Risk Interventions     No data to display

## 2023-08-01 NOTE — Plan of Care (Signed)
  Problem: Clinical Measurements: Goal: Cardiovascular complication will be avoided Outcome: Progressing   Problem: Nutrition: Goal: Adequate nutrition will be maintained Outcome: Progressing   Problem: Elimination: Goal: Will not experience complications related to bowel motility Outcome: Progressing Goal: Will not experience complications related to urinary retention Outcome: Progressing

## 2023-08-02 ENCOUNTER — Inpatient Hospital Stay: Payer: Medicare Other

## 2023-08-02 DIAGNOSIS — E039 Hypothyroidism, unspecified: Secondary | ICD-10-CM

## 2023-08-02 DIAGNOSIS — I959 Hypotension, unspecified: Secondary | ICD-10-CM | POA: Diagnosis not present

## 2023-08-02 DIAGNOSIS — N451 Epididymitis: Secondary | ICD-10-CM | POA: Diagnosis not present

## 2023-08-02 DIAGNOSIS — M009 Pyogenic arthritis, unspecified: Secondary | ICD-10-CM | POA: Diagnosis not present

## 2023-08-02 DIAGNOSIS — D508 Other iron deficiency anemias: Secondary | ICD-10-CM

## 2023-08-02 DIAGNOSIS — F4320 Adjustment disorder, unspecified: Secondary | ICD-10-CM | POA: Diagnosis present

## 2023-08-02 MED ORDER — LEVOFLOXACIN 500 MG PO TABS
500.0000 mg | ORAL_TABLET | Freq: Every day | ORAL | Status: DC
  Administered 2023-08-02 – 2023-08-06 (×3): 500 mg via ORAL
  Filled 2023-08-02 (×5): qty 1

## 2023-08-02 MED ORDER — CAMPHOR-MENTHOL 0.5-0.5 % EX LOTN
TOPICAL_LOTION | CUTANEOUS | Status: DC | PRN
  Filled 2023-08-02: qty 222

## 2023-08-02 NOTE — Plan of Care (Signed)
  Problem: Clinical Measurements: Goal: Ability to maintain clinical measurements within normal limits will improve Outcome: Progressing Goal: Will remain free from infection Outcome: Progressing Goal: Cardiovascular complication will be avoided Outcome: Progressing   Problem: Nutrition: Goal: Adequate nutrition will be maintained Outcome: Progressing   Problem: Elimination: Goal: Will not experience complications related to bowel motility Outcome: Progressing Goal: Will not experience complications related to urinary retention Outcome: Progressing   Problem: Pain Managment: Goal: General experience of comfort will improve Outcome: Progressing

## 2023-08-02 NOTE — Progress Notes (Signed)
Patient refused to have his VS checked by both RN and NT.

## 2023-08-02 NOTE — Assessment & Plan Note (Addendum)
Last hemoglobin 8.5.  Declined further lab draw.  I explained that his last lab draw he was anemic and I would like to check labs again.  He refused today.

## 2023-08-02 NOTE — Progress Notes (Signed)
Patient keep on asking for food that is not available in the pantry, the author offered him cereals and [place] sandwich and explain to him that it's not breakfast time yet but instead he throw the communication board.

## 2023-08-02 NOTE — Assessment & Plan Note (Signed)
As needed Zyprexa

## 2023-08-02 NOTE — Assessment & Plan Note (Addendum)
On levothyroxine  ?

## 2023-08-02 NOTE — Assessment & Plan Note (Addendum)
Midodrine discontinued since he has not been taking it anyway.

## 2023-08-02 NOTE — Assessment & Plan Note (Addendum)
Patient took Levaquin today.  Explained that he needs to take it in order to improve.  Sonogram does not show any abscess or testicular torsion.  Does show varicocele.

## 2023-08-02 NOTE — Progress Notes (Signed)
PT Cancellation Note  Patient Details Name: Martin [name] MRN: [id number] DOB: 1956-08-14   Cancelled Treatment:     PT attempted x 2. First attempt, pt off floor for [place]. Second attempt. Pt refused. Will return tomorrow and continue to follow + progress per current POC.    Rushie Chestnut 08/02/2023, 1:42 PM
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
## 2023-08-03 DIAGNOSIS — M009 Pyogenic arthritis, unspecified: Secondary | ICD-10-CM | POA: Diagnosis not present

## 2023-08-03 DIAGNOSIS — I9589 Other hypotension: Secondary | ICD-10-CM | POA: Diagnosis not present

## 2023-08-03 DIAGNOSIS — F4320 Adjustment disorder, unspecified: Secondary | ICD-10-CM | POA: Diagnosis not present

## 2023-08-03 DIAGNOSIS — N451 Epididymitis: Secondary | ICD-10-CM | POA: Diagnosis not present

## 2023-08-03 NOTE — Progress Notes (Signed)
PT Cancellation Note  Patient Details Name: Martin [name] MRN: [id number] DOB: [birth year]   Cancelled Treatment:     Several attempts made to treat pt this date. Interpreter not coming to hospital until after 5PM. Author woke pt up every attempt to work with pt with gentle touch on R shoulder. PT awakes but quickly closes his eyes and returns to sleep. Untouched food trays on bedside table x 2. Will return tomorrow and continue our efforts to treat when in person interpreter is present.     Rushie Chestnut 08/03/2023, 3:59 PM
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
## 2023-08-04 DIAGNOSIS — N451 Epididymitis: Secondary | ICD-10-CM | POA: Diagnosis not present

## 2023-08-04 DIAGNOSIS — M009 Pyogenic arthritis, unspecified: Secondary | ICD-10-CM | POA: Diagnosis not present

## 2023-08-04 DIAGNOSIS — F4320 Adjustment disorder, unspecified: Secondary | ICD-10-CM | POA: Diagnosis not present

## 2023-08-04 DIAGNOSIS — I9589 Other hypotension: Secondary | ICD-10-CM | POA: Diagnosis not present
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
## 2023-08-04 NOTE — Plan of Care (Signed)

## 2023-08-04 NOTE — Progress Notes (Signed)
Patient refused vital signs check

## 2023-08-04 NOTE — Progress Notes (Signed)
PT Cancellation Note  Patient Details Name: Martin [name] MRN: [id number] DOB: Nov 16, 1955   Cancelled Treatment:     PT attempt. Pt more non responsive even with interpreter present. He did shake head no but with overall increased time to process/respond. Remains unwilling to sign/communicate even with max encouragement. Unwilling to attempt any mobility at this time. PT will continue efforts as able per pt willingness. MD aware of pt current presentation and change over past few days-week.    Rushie Chestnut 08/04/2023, 11:16 AM

## 2023-08-05 DIAGNOSIS — M009 Pyogenic arthritis, unspecified: Secondary | ICD-10-CM | POA: Diagnosis not present

## 2023-08-05 DIAGNOSIS — N451 Epididymitis: Secondary | ICD-10-CM | POA: Diagnosis not present

## 2023-08-05 DIAGNOSIS — F4320 Adjustment disorder, unspecified: Secondary | ICD-10-CM | POA: Diagnosis not present

## 2023-08-05 DIAGNOSIS — H548 Legal blindness, as defined in USA: Secondary | ICD-10-CM | POA: Diagnosis not present

## 2023-08-05 NOTE — Plan of Care (Signed)

## 2023-08-05 NOTE — Progress Notes (Signed)
Patient does not want staff to wake him from sleep to do blood pressures or to take medications. He will ask for his eye drops, antibiotics, and anti-itch medication( atarax) when he awakes.
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
## 2023-08-06 DIAGNOSIS — M009 Pyogenic arthritis, unspecified: Secondary | ICD-10-CM | POA: Diagnosis not present

## 2023-08-06 DIAGNOSIS — E038 Other specified hypothyroidism: Secondary | ICD-10-CM

## 2023-08-06 DIAGNOSIS — N451 Epididymitis: Secondary | ICD-10-CM | POA: Diagnosis not present

## 2023-08-06 DIAGNOSIS — F4320 Adjustment disorder, unspecified: Secondary | ICD-10-CM | POA: Diagnosis not present

## 2023-08-06 DIAGNOSIS — I9589 Other hypotension: Secondary | ICD-10-CM | POA: Diagnosis not present

## 2023-08-06 MED ORDER — LEVOFLOXACIN 500 MG PO TABS
500.0000 mg | ORAL_TABLET | Freq: Every day | ORAL | Status: DC
  Administered 2023-08-07 – 2023-08-09 (×3): 500 mg via ORAL
  Filled 2023-08-06 (×4): qty 1

## 2023-08-06 NOTE — Plan of Care (Signed)
  Problem: Clinical Measurements: Goal: Cardiovascular complication will be avoided Outcome: Progressing   Problem: Nutrition: Goal: Adequate nutrition will be maintained Outcome: Progressing   Problem: Elimination: Goal: Will not experience complications related to bowel motility Outcome: Progressing Goal: Will not experience complications related to urinary retention Outcome: Progressing   Problem: Safety: Goal: Ability to remain free from injury will improve Outcome: Progressing

## 2023-08-06 NOTE — Plan of Care (Signed)
  Problem: Education: Goal: Knowledge of General Education information will improve Description: Including pain rating scale, medication(s)/side effects and non-pharmacologic comfort measures Outcome: Progressing   Problem: Clinical Measurements: Goal: Ability to maintain clinical measurements within normal limits will improve Outcome: Progressing Goal: Will remain free from infection Outcome: Progressing Goal: Cardiovascular complication will be avoided Outcome: Progressing
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
## 2023-08-07 DIAGNOSIS — N451 Epididymitis: Secondary | ICD-10-CM | POA: Diagnosis not present

## 2023-08-07 DIAGNOSIS — Z789 Other specified health status: Secondary | ICD-10-CM

## 2023-08-07 DIAGNOSIS — F4320 Adjustment disorder, unspecified: Secondary | ICD-10-CM | POA: Diagnosis not present

## 2023-08-07 DIAGNOSIS — I959 Hypotension, unspecified: Secondary | ICD-10-CM | POA: Diagnosis not present

## 2023-08-07 DIAGNOSIS — M009 Pyogenic arthritis, unspecified: Secondary | ICD-10-CM | POA: Diagnosis not present

## 2023-08-07 MED ORDER — TRAMADOL HCL 50 MG PO TABS
50.0000 mg | ORAL_TABLET | Freq: Two times a day (BID) | ORAL | Status: DC | PRN
  Administered 2023-08-09 – 2023-09-16 (×12): 50 mg via ORAL
  Filled 2023-08-07 (×12): qty 1

## 2023-08-07 MED ORDER — ADULT MULTIVITAMIN W/MINERALS CH
1.0000 | ORAL_TABLET | Freq: Every day | ORAL | Status: DC
Start: 2023-08-07 — End: 2023-10-19
  Administered 2023-08-07 – 2023-10-07 (×9): 1 via ORAL
  Filled 2023-08-07 (×22): qty 1

## 2023-08-07 NOTE — Plan of Care (Signed)
  Problem: Education: Goal: Knowledge of General Education information will improve Description: Including pain rating scale, medication(s)/side effects and non-pharmacologic comfort measures Outcome: Progressing   Problem: Pain Managment: Goal: General experience of comfort will improve Outcome: Progressing   Problem: Safety: Goal: Ability to remain free from injury will improve Outcome: Progressing   

## 2023-08-07 NOTE — Plan of Care (Signed)
  Problem: Clinical Measurements: Goal: Ability to maintain clinical measurements within normal limits will improve Outcome: Progressing Goal: Will remain free from infection Outcome: Progressing Goal: Cardiovascular complication will be avoided Outcome: Progressing   Problem: Elimination: Goal: Will not experience complications related to bowel motility Outcome: Progressing Goal: Will not experience complications related to urinary retention Outcome: Progressing   Problem: Pain Managment: Goal: General experience of comfort will improve Outcome: Progressing

## 2023-08-07 NOTE — Assessment & Plan Note (Signed)
As per psychiatry 

## 2023-08-07 NOTE — TOC Progression Note (Signed)
Transition of Care [facility]) - Progression Note    Patient Details  Name: Nezar Langer MRN: [id number] Date of Birth: 09-14-1956  Transition of Care [facility]) CM/SW Contact  Marlowe Sax, RN Phone Number: 08/07/2023, 12:52 PM  Clinical Narrative:    Reached out to Milinda Pointer at [facility] to inquire about who is assigned to him and what the plan is. Awaiting a return call   Expected Discharge Plan:  (TBD) Barriers to Discharge: Continued Medical Work up  Expected Discharge Plan and Services                                               Social Determinants of Health (SDOH) Interventions SDOH Screenings   Tobacco Use: Low Risk  (04/29/2023)    Readmission Risk Interventions     No data to display
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
## 2023-08-08 DIAGNOSIS — F4320 Adjustment disorder, unspecified: Secondary | ICD-10-CM | POA: Diagnosis not present

## 2023-08-08 DIAGNOSIS — B351 Tinea unguium: Secondary | ICD-10-CM | POA: Insufficient documentation

## 2023-08-08 DIAGNOSIS — Z933 Colostomy status: Secondary | ICD-10-CM | POA: Diagnosis not present

## 2023-08-08 DIAGNOSIS — N451 Epididymitis: Secondary | ICD-10-CM | POA: Diagnosis not present

## 2023-08-08 DIAGNOSIS — M009 Pyogenic arthritis, unspecified: Secondary | ICD-10-CM | POA: Diagnosis not present

## 2023-08-08 NOTE — Assessment & Plan Note (Signed)
I was able to trim his toenails today since the nursing staff was able to get me a toenail clipper from the gift shop.
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
## 2023-08-08 NOTE — Plan of Care (Signed)
  Problem: Education: Goal: Knowledge of General Education information will improve Description Including pain rating scale, medication(s)/side effects and non-pharmacologic comfort measures Outcome: Progressing   Problem: Clinical Measurements: Goal: Ability to maintain clinical measurements within normal limits will improve Outcome: Progressing Goal: Will remain free from infection Outcome: Progressing Goal: Cardiovascular complication will be avoided Outcome: Progressing   Problem: Activity: Goal: Risk for activity intolerance will decrease Outcome: Progressing   Problem: Nutrition: Goal: Adequate nutrition will be maintained Outcome: Progressing   Problem: Coping: Goal: Level of anxiety will decrease Outcome: Progressing   Problem: Elimination: Goal: Will not experience complications related to bowel motility Outcome: Progressing Goal: Will not experience complications related to urinary retention Outcome: Progressing   Problem: Pain Managment: Goal: General experience of comfort will improve Outcome: Progressing   Problem: Safety: Goal: Ability to remain free from injury will improve Outcome: Progressing   Problem: Skin Integrity: Goal: Risk for impaired skin integrity will decrease Outcome: Progressing   

## 2023-08-08 NOTE — [place] FL2 (Signed)
[place] MEDICAID FL2 LEVEL OF CARE FORM     IDENTIFICATION  Patient Name: Martin [name] Birthdate: Jan 20, 1956 Sex: male Admission Date (Current Location): 03/30/2023  [place] and [place] Number:  [name] [phone] R Facility and Address:  [facility], [address], [place], [place] [address]      Provider Number: [id number]  Attending Physician Name and Address:  Alford Highland, MD  Relative Name and Phone Number:  none want to be called    Current Level of Care: Hospital Recommended Level of Care: Other (Comment) (group home) Prior Approval Number:    Date Approved/Denied:   PASRR Number: [id number]  Discharge Plan: Domiciliary (Rest home)    Current Diagnoses: Patient Active Problem List   Diagnosis Date Noted   Epididymitis, bilateral 08/02/2023   Adjustment disorder 08/02/2023   Hypotension 08/01/2023   Impaired decision making 07/31/2023   Evaluation by psychiatric service required 07/29/2023   Iron deficiency anemia 07/27/2023   Colostomy status (HCC) 06/24/2023   Ogilvie's syndrome    Thrombocytopenia (HCC)    CKD (chronic kidney disease)    Septic joint of right knee joint (HCC) 12/21/2015   Chronic pain syndrome 12/21/2015   Legally blind 12/21/2015   Deaf 12/21/2015   Hypothyroidism, unspecified 12/21/2015   Chronic constipation     Orientation RESPIRATION BLADDER Height & Weight     Self, Time, Situation, Place  Normal Continent Weight: 60 kg Height:     BEHAVIORAL SYMPTOMS/MOOD NEUROLOGICAL BOWEL NUTRITION STATUS  Other (Comment) (has to be approched slowley due to deaf and blind, lightly touch on right shoulder to alert him of your presence, need sign ;language interpreter)   Colostomy Diet (regular diet)  AMBULATORY STATUS COMMUNICATION OF NEEDS Skin   Limited Assist Non-Verbally Normal                       Personal Care Assistance Level of Assistance  Bathing, Feeding, Dressing Bathing Assistance:  Limited assistance Feeding assistance: Limited assistance Dressing Assistance: Limited assistance     Functional Limitations Info  Sight, Hearing, Speech Sight Info: Impaired (mostly blind,) Hearing Info: Impaired (Deaf) Speech Info: Impaired (unable to speak)    SPECIAL CARE FACTORS FREQUENCY   (colostomy care)                    Contractures Contractures Info: Not present    Additional Factors Info  Code Status, Allergies Code Status Info: Full code Allergies Info: IV Dye Iodinated Contrast media           Current Medications (08/08/2023):  This is the current hospital active medication list Current Facility-Administered Medications  Medication Dose Route Frequency Provider Last Rate Last Admin   acetaminophen (TYLENOL) tablet 650 mg  650 mg Oral Q6H PRN Deeann Saint, MD   650 mg at 07/11/23 1927   Or   acetaminophen (TYLENOL) suppository 650 mg  650 mg Rectal Q6H PRN Deeann Saint, MD       alum & mag hydroxide-simeth (MAALOX/MYLANTA) 200-200-20 MG/5ML suspension 30 mL  30 mL Oral Q6H PRN Deeann Saint, MD       ascorbic acid (VITAMIN C) tablet 500 mg  500 mg Oral Q24H Hunt, Madison H, RPH   500 mg at 08/07/23 1258   camphor-menthol (SARNA) lotion   Topical PRN Alford Highland, MD   Given at 08/06/23 7846   diclofenac Sodium (VOLTAREN) 1 % topical gel 2 g  2 g Topical Q6H PRN Deeann Saint, MD  2 g at 07/20/23 1634   dorzolamide (TRUSOPT) 2 % ophthalmic solution 1 drop  1 drop Both Eyes BID Jaynie Bream, RPH   1 drop at 08/08/23 1026   feeding supplement (ENSURE ENLIVE / ENSURE PLUS) liquid 237 mL  237 mL Oral BID BM Deeann Saint, MD   237 mL at 08/07/23 1300   hydrOXYzine (ATARAX) tablet 25 mg  25 mg Oral TID PRN Deeann Saint, MD   25 mg at 08/08/23 1023   iron polysaccharides (NIFEREX) capsule 150 mg  150 mg Oral Q24H Hunt, Madison H, RPH   150 mg at 07/24/23 1408   latanoprost (XALATAN) 0.005 % ophthalmic solution 1 drop  1 drop Both Eyes QHS  Deeann Saint, MD   1 drop at 08/07/23 2109   levofloxacin (LEVAQUIN) tablet 500 mg  500 mg Oral Daily Alford Highland, MD   500 mg at 08/08/23 1024   levothyroxine (SYNTHROID) tablet 125 mcg  125 mcg Oral Q24H Merryl Hacker, RPH   125 mcg at 08/08/23 1023   LORazepam (ATIVAN) tablet 0.5 mg  0.5 mg Oral Q6H PRN Marrion Coy, MD       Or   LORazepam (ATIVAN) injection 0.5 mg  0.5 mg Intramuscular Q6H PRN Marrion Coy, MD       multivitamin with minerals tablet 1 tablet  1 tablet Oral Daily Alford Highland, MD   1 tablet at 08/08/23 1023   OLANZapine zydis (ZYPREXA) disintegrating tablet 5 mg  5 mg Oral BID PRN Deeann Saint, MD       Or   OLANZapine [facility]) injection 5 mg  5 mg Intramuscular BID PRN Deeann Saint, MD       ondansetron [facility]) tablet 4 mg  4 mg Oral Q6H PRN Deeann Saint, MD       Or   ondansetron [facility]) injection 4 mg  4 mg Intravenous Q6H PRN Deeann Saint, MD       pantoprazole (PROTONIX) EC tablet 40 mg  40 mg Oral Q24H Hunt, Madison H, RPH   40 mg at 08/07/23 1258   polyethylene glycol (MIRALAX / GLYCOLAX) packet 17 g  17 g Oral Q24H Hunt, Madison H, RPH   17 g at 08/07/23 1300   polyvinyl alcohol (LIQUIFILM TEARS) 1.4 % ophthalmic solution 2 drop  2 drop Both Eyes PRN Deeann Saint, MD   2 drop at 07/17/23 1436   traMADol (ULTRAM) tablet 50 mg  50 mg Oral Q12H PRN Alford Highland, MD       traZODone (DESYREL) tablet 50 mg  50 mg Oral QHS PRN Deeann Saint, MD   50 mg at 04/19/23 0028   trolamine salicylate (ASPERCREME) 10 % cream   Topical BID PRN Deeann Saint, MD   Given at 06/09/23 1157   Vitamin D (Ergocalciferol) (DRISDOL) 1.25 MG (50000 UNIT) capsule 50,000 Units  50,000 Units Oral Q7 days Gillis Santa, MD   50,000 Units at 08/06/23 1328   white petrolatum (VASELINE) gel   Topical PRN Marrion Coy, MD         Discharge Medications: Please see discharge summary for a list of discharge medications.  Relevant Imaging Results:  Relevant Lab  Results:   Additional Information SSN [id number]  Marlowe Sax, RN

## 2023-08-08 NOTE — Progress Notes (Signed)
PT Cancellation Note  Patient Details Name: Martin [name] MRN: [id number] DOB: July 05, 1956   Cancelled Treatment:    Reason Eval/Treat Not Completed:  (Treatment session attempted (but interpreter already left for the day). Encouraged participation with transfer to provide updated notes/information necessary for discharge planning.  Patient voiced understanding of request and need for transfer, but politely requested (via white board) that therapist re-attempt next date when interpreter is available.  Interpreter scheduled for 1000 next date; will continue efforts at that time.  Manual WC, pants, socks and linens left in bathroom of patient's room to be available for session next date.)   Chadd Tollison H. Manson Passey, PT, DPT, NCS 08/08/23, 1:49 PM [phone]

## 2023-08-09 DIAGNOSIS — H548 Legal blindness, as defined in USA: Secondary | ICD-10-CM | POA: Diagnosis not present

## 2023-08-09 DIAGNOSIS — H9193 Unspecified hearing loss, bilateral: Secondary | ICD-10-CM | POA: Diagnosis not present

## 2023-08-09 DIAGNOSIS — N451 Epididymitis: Secondary | ICD-10-CM | POA: Diagnosis not present

## 2023-08-09 MED ORDER — AMOXICILLIN-POT CLAVULANATE 875-125 MG PO TABS: 1.0000 | ORAL_TABLET | Freq: Two times a day (BID) | ORAL | Status: DC

## 2023-08-09 MED ORDER — AZITHROMYCIN 500 MG PO TABS: 500.0000 mg | ORAL_TABLET | Freq: Every day | ORAL | Status: DC

## 2023-08-09 MED ORDER — LEVOFLOXACIN 500 MG PO TABS
500.0000 mg | ORAL_TABLET | Freq: Every day | ORAL | Status: DC
  Administered 2023-08-10 – 2023-08-15 (×4): 500 mg via ORAL
  Filled 2023-08-09 (×6): qty 1

## 2023-08-09 NOTE — Plan of Care (Signed)
  Problem: Education: Goal: Knowledge of General Education information will improve Description Including pain rating scale, medication(s)/side effects and non-pharmacologic comfort measures Outcome: Progressing   Problem: Clinical Measurements: Goal: Ability to maintain clinical measurements within normal limits will improve Outcome: Progressing Goal: Will remain free from infection Outcome: Progressing Goal: Cardiovascular complication will be avoided Outcome: Progressing   Problem: Activity: Goal: Risk for activity intolerance will decrease Outcome: Progressing   Problem: Nutrition: Goal: Adequate nutrition will be maintained Outcome: Progressing   Problem: Coping: Goal: Level of anxiety will decrease Outcome: Progressing   Problem: Elimination: Goal: Will not experience complications related to bowel motility Outcome: Progressing Goal: Will not experience complications related to urinary retention Outcome: Progressing   Problem: Pain Managment: Goal: General experience of comfort will improve Outcome: Progressing   Problem: Safety: Goal: Ability to remain free from injury will improve Outcome: Progressing   Problem: Skin Integrity: Goal: Risk for impaired skin integrity will decrease Outcome: Progressing   
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
## 2023-08-09 NOTE — Progress Notes (Signed)
Physical Therapy Treatment Patient Details Name: Martin [name] MRN: [id number] DOB: May 05, 1956 Today's Date: 08/09/2023   History of Present Illness Martin [name] is a 67yoM presenting to hospital 03/30/23 under IVC from residential facility due to behavioral concerns at facility; c/o chronic L knee pain.  PMH includes deafness, impaired vision, OA, hypothyroidism, L knee surgery, ostomy. Patient reporting increased L knee swelling chronic L knee pain. Status post R knee debridement and washout (06/24/23).    PT Comments  Pt was long sitting in bed with ASL interpreter [name]) at bedside. Pt has recently finished taking medication. Untouched breakfast at bedside with pt reporting he will eat later.  Martin [name] was unwilling to attempt OOB activity even with max encouragement. He did however participate in exercises in bed and endorses willingness to try OOB next date. Author will return tomorrow and progress pt to OOB if he is willing. See exercises performed listed below. He has been pain/self limited. Still demonstrates abilities/strength required to perform OOB task.    If plan is discharge home, recommend the following: A lot of help with walking and/or transfers;A lot of help with bathing/dressing/bathroom     Equipment Recommendations  Other (comment) (ongoing assessment)       Precautions / Restrictions Precautions Precautions: Fall Precaution Comments: Legally blind, deaf, Ostomy Restrictions Weight Bearing Restrictions: Yes RLE Weight Bearing: Weight bearing as tolerated LLE Weight Bearing: Weight bearing as tolerated     Mobility  Bed Mobility    General bed mobility comments: pt unwilling to attempt getting OOB. he did participate in ther ex list below.    Transfers  General transfer comment: unwilling this date but per pt will attempt tomorrow       Cognition Arousal: Alert Behavior During Therapy: [facility] for tasks assessed/performed Overall Cognitive Status: Within Functional  Limits for tasks assessed    General Comments: Pt observed to be much more alert and conversational than previous attempt by author. INterpreter used throughout. PT unwilling to attempt OOB activity but did fully participate in minimal ther ex. Pt endorses wanting to get OOB next date.        Exercises General Exercises - Lower Extremity Ankle Circles/Pumps: 10 reps, Both Quad Sets: AROM, Both, 10 reps Heel Slides: AROM, 5 reps Straight Leg Raises: AROM, Both, 5 reps    General Comments General comments (skin integrity, edema, etc.): Author progressed pt slowly. pt does not like to feel rushed and likes things explained before performed.      Pertinent Vitals/Pain Pain Assessment Pain Assessment: PAINAD Breathing: normal Negative Vocalization: none Facial Expression: smiling or inexpressive Body Language: relaxed Consolability: no need to console PAINAD Score: 0 Pain Location: L > R knee today Pain Descriptors / Indicators: Moaning, Guarding, Grimacing Pain Intervention(s): Limited activity within patient's tolerance, Monitored during session, Premedicated before session, Repositioned (unwilling to use ICE)     PT Goals (current goals can now be found in the care plan section) Acute Rehab PT Goals Patient Stated Goal: L knee hurts today more than R Progress towards PT goals: Not progressing toward goals - comment    Frequency    Min 1X/week      PT Plan Current plan remains appropriate       AM-PAC PT "6 Clicks" Mobility   Outcome Measure  Help needed turning from your back to your side while in a flat bed without using bedrails?: None Help needed moving from lying on your back to sitting on the side of a flat bed  without using bedrails?: None Help needed moving to and from a bed to a chair (including a wheelchair)?: A Lot Help needed standing up from a chair using your arms (e.g., wheelchair or bedside chair)?: A Lot Help needed to walk in hospital room?: A  Lot Help needed climbing 3-5 steps with a railing? : Total 6 Click Score: 15    End of Session   Activity Tolerance: Patient limited by pain;Other (comment) (limited willingness to  attempt OOB) Patient left: in bed;with call bell/phone within reach;with bed alarm set Nurse Communication: Mobility status PT Visit Diagnosis: Other abnormalities of gait and mobility (R26.89);Muscle weakness (generalized) (M62.81);Difficulty in walking, not elsewhere classified (R26.2);Pain Pain - Right/Left: Right Pain - part of body: Knee     Time: 4098-1191 PT Time Calculation (min) (ACUTE ONLY): 15 min  Charges:    $Therapeutic Exercise: 8-22 mins PT General Charges $$ ACUTE PT VISIT: 1 Visit                     Jetta Lout PTA 08/09/23, 11:07 AM

## 2023-08-09 NOTE — TOC Progression Note (Signed)
Transition of Care [facility]) - Progression Note    Patient Details  Name: Martin [name] MRN: [id number] Date of Birth: 04-12-1956  Transition [facility]) CM/SW Contact  Marlowe Sax, RN Phone Number: 08/09/2023, 2:23 PM  Clinical Narrative:    Kipp Brood at [phone] The Romelle Starcher she has is in a facility that requires him to be independent however another facility may be able to accommodate his needs, she requested to call her Back on Friday and they would let us know  Expected Discharge Plan:  (TBD) Barriers to Discharge: Continued Medical Work up  Expected Discharge Plan and Services                                               Social Determinants of Health (SDOH) Interventions SDOH Screenings   Tobacco Use: Low Risk  (04/29/2023)    Readmission Risk Interventions     No data to display

## 2023-08-10 DIAGNOSIS — Z789 Other specified health status: Secondary | ICD-10-CM | POA: Diagnosis not present

## 2023-08-10 DIAGNOSIS — N451 Epididymitis: Secondary | ICD-10-CM | POA: Diagnosis not present

## 2023-08-10 DIAGNOSIS — F4320 Adjustment disorder, unspecified: Secondary | ICD-10-CM | POA: Diagnosis not present

## 2023-08-10 NOTE — Plan of Care (Signed)

## 2023-08-10 NOTE — Progress Notes (Signed)
Physical Therapy Treatment Patient Details Name: Martin [name] MRN: [id number] DOB: April 16, 1956 Today's Date: 08/10/2023   History of Present Illness Martin [name] is a 67yoM presenting to hospital 03/30/23 under IVC from residential facility due to behavioral concerns at facility; c/o chronic L knee pain.  PMH includes deafness, impaired vision, OA, hypothyroidism, L knee surgery, ostomy. Patient reporting increased L knee swelling chronic L knee pain. Status post R knee debridement and washout (06/24/23).    PT Comments  Pt was long sitting in bed independently shaving upon arrival. In-person ASL interpreter present throughout. Pt does not like to feel rushed and does dictate session progression however much more cooperative and willing to participate this date. Pt did not c/o severe pain at rest. He signs," A little." Pt is easily able to achieve EOB short sit  with supervision + occasional CGA to communicate desired positioning requested of him. He stood EOB 2 x. Pt wanted to attempt standing to crutches versus RW. First attempt to crutches was poor due to pt impulsively standing prior to crutches being adjusted to proper height. First attempt, pt stood only a few seconds. 2nd attempt, pt agreeable to stand to RW. Due to pt's poor BLE knee ROM, pt requires assistance to fully achieve standing. Min assist of one with fwd wt shift. Pt c/o severe R quad pain in standing and returns to seated EOB after ~ 8-10 sec standing. Heavy reliance on BUE support due to pain. Pt states pain is too much to attempt further transfers. Lengthy education on need to get OOB more frequently to improve strength and activity tolerance. Pt seemed to understand. Acute PT will make sure pt is premedicated prior to further OOB attempts in hopes pt will continue to progress per current POC.   If plan is discharge home, recommend the following: A lot of help with walking and/or transfers;A lot of help with bathing/dressing/bathroom      Equipment Recommendations  Rolling walker (2 wheels);BSC/3in1;Wheelchair (measurements PT);Wheelchair cushion (measurements PT)       Precautions / Restrictions Precautions Precautions: Fall Precaution Comments: Legally blind, deaf, Ostomy Restrictions Weight Bearing Restrictions: Yes RLE Weight Bearing: Weight bearing as tolerated LLE Weight Bearing: Weight bearing as tolerated     Mobility  Bed Mobility Overal bed mobility: Needs Assistance Bed Mobility: Supine to Sit Rolling: Supervision, Used rails Supine to sit: Supervision Sit to supine: Supervision, Contact guard assist   Transfers Overall transfer level: Needs assistance Equipment used: Rolling walker (2 wheels), Crutches Transfers: Sit to/from Stand Sit to Stand: Min assist  General transfer comment: min assist required for holding RW. Poor knee flexion in both LEs. pt stood 2 x but was only able to tolerate for ~ 10 sec prior to c/o R quad pain and sitting back down. Will have pt premedicated for pain prior to next session.    Ambulation/Gait  General Gait Details: pt unwilling/unable to progress away from EOB   Balance Overall balance assessment: Needs assistance Sitting-balance support: No upper extremity supported, Feet supported Sitting balance-Leahy Scale: Good     Standing balance support: Bilateral upper extremity supported, During functional activity, Reliant on assistive device for balance Standing balance-Leahy Scale: Poor Standing balance comment: limited time to assess due to poor standing tolerate however slight posterior LOB       Cognition Arousal: Alert Behavior During Therapy: WFL for tasks assessed/performed Overall Cognitive Status: Within Functional Limits for tasks assessed      General Comments: Pt is A and cooperative however does  not liek to be rushed and dictates session progression.        Exercises Total Joint Exercises Towel Squeeze: AROM, Both, 10 reps, Supine General  Exercises - Lower Extremity Ankle Circles/Pumps: AROM, 10 reps Quad Sets: AROM, 10 reps Heel Slides: AROM, 5 reps, Both Straight Leg Raises: AROM, 5 reps, Both        Pertinent Vitals/Pain Pain Assessment Pain Assessment: PAINAD Breathing: normal Negative Vocalization: occasional moan/groan, low speech, negative/disapproving quality Facial Expression: smiling or inexpressive Body Language: tense, distressed pacing, fidgeting Consolability: distracted or reassured by voice/touch PAINAD Score: 3 Pain Location: R quad today Pain Descriptors / Indicators: Moaning, Guarding, Grimacing Pain Intervention(s): Limited activity within patient's tolerance, Monitored during session, Repositioned     PT Goals (current goals can now be found in the care plan section) Acute Rehab PT Goals Patient Stated Goal: R quad pain    Frequency    Min 1X/week      PT Plan Current plan remains appropriate       AM-PAC PT "6 Clicks" Mobility   Outcome Measure  Help needed turning from your back to your side while in a flat bed without using bedrails?: None Help needed moving from lying on your back to sitting on the side of a flat bed without using bedrails?: None Help needed moving to and from a bed to a chair (including a wheelchair)?: A Lot Help needed standing up from a chair using your arms (e.g., wheelchair or bedside chair)?: A Lot Help needed to walk in hospital room?: A Lot Help needed climbing 3-5 steps with a railing? : Total 6 Click Score: 15    End of Session   Activity Tolerance: Patient tolerated treatment well Patient left: in bed;with call bell/phone within reach;with bed alarm set Nurse Communication: Mobility status PT Visit Diagnosis: Other abnormalities of gait and mobility (R26.89);Muscle weakness (generalized) (M62.81);Difficulty in walking, not elsewhere classified (R26.2);Pain Pain - Right/Left: Right Pain - part of body: Knee     Time:  -     Charges:                             Jetta Lout PTA 08/10/23, 4:48 PM
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
## 2023-08-11 DIAGNOSIS — N451 Epididymitis: Secondary | ICD-10-CM | POA: Diagnosis not present

## 2023-08-11 DIAGNOSIS — F4320 Adjustment disorder, unspecified: Secondary | ICD-10-CM | POA: Diagnosis not present

## 2023-08-11 DIAGNOSIS — H548 Legal blindness, as defined in USA: Secondary | ICD-10-CM | POA: Diagnosis not present

## 2023-08-11 NOTE — TOC Progression Note (Signed)
Transition of Care [facility]) - Progression Note    Patient Details  Name: Martin [name] MRN: [id number] Date of Birth: Feb 20, 1956  Transition of Care [facility]) CM/SW Contact  Marlowe Sax, RN Phone Number: 08/11/2023, 11:54 AM  Clinical Narrative:    Myrtie Soman Ruck to check on bed availability her voice mail is full and I could not leave a VM    Expected Discharge Plan:  (TBD) Barriers to Discharge: Continued Medical Work up  Expected Discharge Plan and Services                                               Social Determinants of Health (SDOH) Interventions SDOH Screenings   Tobacco Use: Low Risk  (04/29/2023)    Readmission Risk Interventions     No data to display

## 2023-08-11 NOTE — Progress Notes (Signed)
PT Cancellation Note  Patient Details Name: Martin [name] MRN: [id number] DOB: Sep 09, 1956   Cancelled Treatment:    Reason Eval/Treat Not Completed: Patient declined, no reason specified (PM treatment session attempted with interpreter present.  Offered/encouraged OOB and mobility activities; patient declined despite encouragement, citing "not today". Unable to redirect.)   Ilyana Manuele H. Manson Passey, PT, DPT, NCS 08/11/23, 2:43 PM [phone]
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
## 2023-08-11 NOTE — Plan of Care (Signed)
  Problem: Clinical Measurements: Goal: Ability to maintain clinical measurements within normal limits will improve Outcome: Progressing Goal: Will remain free from infection Outcome: Progressing Goal: Cardiovascular complication will be avoided Outcome: Progressing   Problem: Activity: Goal: Risk for activity intolerance will decrease Outcome: Progressing

## 2023-08-11 NOTE — Plan of Care (Signed)
  Problem: Education: Goal: Knowledge of General Education information will improve Description: Including pain rating scale, medication(s)/side effects and non-pharmacologic comfort measures Outcome: Progressing   Problem: Clinical Measurements: Goal: Ability to maintain clinical measurements within normal limits will improve Outcome: Progressing   Problem: Coping: Goal: Level of anxiety will decrease Outcome: Progressing   Problem: Safety: Goal: Ability to remain free from injury will improve Outcome: Progressing   Problem: Skin Integrity: Goal: Risk for impaired skin integrity will decrease Outcome: Progressing

## 2023-08-12 DIAGNOSIS — H548 Legal blindness, as defined in USA: Secondary | ICD-10-CM | POA: Diagnosis not present

## 2023-08-12 DIAGNOSIS — N451 Epididymitis: Secondary | ICD-10-CM | POA: Diagnosis not present

## 2023-08-12 DIAGNOSIS — F4320 Adjustment disorder, unspecified: Secondary | ICD-10-CM | POA: Diagnosis not present

## 2023-08-12 LAB — ACID FAST CULTURE WITH REFLEXED SENSITIVITIES (MYCOBACTERIA): Acid Fast Culture: NEGATIVE
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
## 2023-08-12 NOTE — Plan of Care (Signed)
Pt alert and oriented. Not cooperative with care. Refused medication and vitals. Voided in urinal. Used white board for communication.  Problem: Clinical Measurements: Goal: Ability to maintain clinical measurements within normal limits will improve Outcome: Progressing Goal: Will remain free from infection Outcome: Progressing Goal: Cardiovascular complication will be avoided Outcome: Progressing   Problem: Elimination: Goal: Will not experience complications related to urinary retention Outcome: Progressing   Problem: Pain Managment: Goal: General experience of comfort will improve Outcome: Progressing   Problem: Safety: Goal: Ability to remain free from injury will improve Outcome: Progressing

## 2023-08-12 NOTE — Progress Notes (Signed)
RN has attempted to give patient daily medications multiple times throughout her shift. Patient continues to ignore RN despite efforts with interpretor and written expression.

## 2023-08-13 DIAGNOSIS — H548 Legal blindness, as defined in USA: Secondary | ICD-10-CM | POA: Diagnosis not present

## 2023-08-13 DIAGNOSIS — N451 Epididymitis: Secondary | ICD-10-CM | POA: Diagnosis not present

## 2023-08-13 DIAGNOSIS — F4320 Adjustment disorder, unspecified: Secondary | ICD-10-CM | POA: Diagnosis not present

## 2023-08-13 NOTE — Progress Notes (Signed)
RN attempted to communicate with patient, so far the patient ignores this RN and has refused vitals, and medications. Awaiting for interpreter to arrive and this RN will re-attempt.

## 2023-08-13 NOTE — Plan of Care (Signed)
  Problem: Education: Goal: Knowledge of General Education information will improve Description Including pain rating scale, medication(s)/side effects and non-pharmacologic comfort measures Outcome: Progressing   Problem: Clinical Measurements: Goal: Ability to maintain clinical measurements within normal limits will improve Outcome: Progressing Goal: Will remain free from infection Outcome: Progressing Goal: Cardiovascular complication will be avoided Outcome: Progressing   Problem: Activity: Goal: Risk for activity intolerance will decrease Outcome: Progressing   Problem: Nutrition: Goal: Adequate nutrition will be maintained Outcome: Progressing   Problem: Coping: Goal: Level of anxiety will decrease Outcome: Progressing   Problem: Elimination: Goal: Will not experience complications related to bowel motility Outcome: Progressing Goal: Will not experience complications related to urinary retention Outcome: Progressing   Problem: Pain Managment: Goal: General experience of comfort will improve Outcome: Progressing   Problem: Safety: Goal: Ability to remain free from injury will improve Outcome: Progressing   Problem: Skin Integrity: Goal: Risk for impaired skin integrity will decrease Outcome: Progressing   
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
## 2023-08-13 NOTE — Plan of Care (Signed)
Patient refused assessment and patient care

## 2023-08-14 DIAGNOSIS — H9193 Unspecified hearing loss, bilateral: Secondary | ICD-10-CM | POA: Diagnosis not present

## 2023-08-14 DIAGNOSIS — F4320 Adjustment disorder, unspecified: Secondary | ICD-10-CM | POA: Diagnosis not present

## 2023-08-14 DIAGNOSIS — H548 Legal blindness, as defined in USA: Secondary | ICD-10-CM | POA: Diagnosis not present

## 2023-08-14 DIAGNOSIS — N451 Epididymitis: Secondary | ICD-10-CM | POA: Diagnosis not present
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
## 2023-08-14 NOTE — Plan of Care (Signed)
  Problem: Education: Goal: Knowledge of General Education information will improve Description: Including pain rating scale, medication(s)/side effects and non-pharmacologic comfort measures Outcome: Progressing   Problem: Clinical Measurements: Goal: Ability to maintain clinical measurements within normal limits will improve Outcome: Progressing   Problem: Coping: Goal: Level of anxiety will decrease Outcome: Progressing   Problem: Elimination: Goal: Will not experience complications related to bowel motility Outcome: Progressing   Problem: Safety: Goal: Ability to remain free from injury will improve Outcome: Progressing

## 2023-08-14 NOTE — Progress Notes (Signed)
PT Cancellation Note  Patient Details Name: Martin [name] MRN: [id number] DOB: [birth year]   Cancelled Treatment:    Reason Eval/Treat Not Completed:  (Treatment session attempted.  Patient sleeping soundly upon arrival; unable to awaken for adequate participation with session.  Will continue efforts next date as appropriate and available.)  Iram Astorino H. Manson Passey, PT, DPT, NCS 08/14/23, 2:53 PM [phone]

## 2023-08-15 DIAGNOSIS — N451 Epididymitis: Secondary | ICD-10-CM | POA: Diagnosis not present

## 2023-08-15 DIAGNOSIS — H9193 Unspecified hearing loss, bilateral: Secondary | ICD-10-CM | POA: Diagnosis not present

## 2023-08-15 DIAGNOSIS — F4320 Adjustment disorder, unspecified: Secondary | ICD-10-CM | POA: Diagnosis not present

## 2023-08-15 NOTE — Plan of Care (Signed)
  Problem: Education: Goal: Knowledge of General Education information will improve Description Including pain rating scale, medication(s)/side effects and non-pharmacologic comfort measures Outcome: Progressing   Problem: Clinical Measurements: Goal: Ability to maintain clinical measurements within normal limits will improve Outcome: Progressing Goal: Will remain free from infection Outcome: Progressing Goal: Cardiovascular complication will be avoided Outcome: Progressing   Problem: Activity: Goal: Risk for activity intolerance will decrease Outcome: Progressing   Problem: Nutrition: Goal: Adequate nutrition will be maintained Outcome: Progressing   Problem: Coping: Goal: Level of anxiety will decrease Outcome: Progressing   Problem: Elimination: Goal: Will not experience complications related to bowel motility Outcome: Progressing Goal: Will not experience complications related to urinary retention Outcome: Progressing   Problem: Pain Managment: Goal: General experience of comfort will improve Outcome: Progressing   Problem: Safety: Goal: Ability to remain free from injury will improve Outcome: Progressing   Problem: Skin Integrity: Goal: Risk for impaired skin integrity will decrease Outcome: Progressing   
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
## 2023-08-15 NOTE — Progress Notes (Signed)
PT Cancellation Note  Patient Details Name: Martin [name] MRN: [id number] DOB: 03/06/1956   Cancelled Treatment:    Reason Eval/Treat Not Completed: Patient declined, no reason specified (Treatment attempted with ASL interpreter at bedside.  Patient persistently refusing participation with session; unable to redirect, reason with. Patient with limited insight into role of therapy, overall recovery and necessity of active participation; self-limiting in participation and progress)   Glynn Yepes H. Manson Passey, PT, DPT, NCS 08/15/23, 11:35 AM [phone]

## 2023-08-16 DIAGNOSIS — N451 Epididymitis: Secondary | ICD-10-CM | POA: Diagnosis not present

## 2023-08-16 MED ORDER — CLONAZEPAM 0.5 MG PO TABS: 0.5000 mg | ORAL_TABLET | Freq: Two times a day (BID) | ORAL | Status: DC | PRN

## 2023-08-16 NOTE — Plan of Care (Signed)
  Problem: Education: Goal: Knowledge of General Education information will improve Description Including pain rating scale, medication(s)/side effects and non-pharmacologic comfort measures Outcome: Progressing   Problem: Clinical Measurements: Goal: Ability to maintain clinical measurements within normal limits will improve Outcome: Progressing Goal: Will remain free from infection Outcome: Progressing Goal: Cardiovascular complication will be avoided Outcome: Progressing   Problem: Activity: Goal: Risk for activity intolerance will decrease Outcome: Progressing   Problem: Nutrition: Goal: Adequate nutrition will be maintained Outcome: Progressing   Problem: Coping: Goal: Level of anxiety will decrease Outcome: Progressing   Problem: Elimination: Goal: Will not experience complications related to bowel motility Outcome: Progressing Goal: Will not experience complications related to urinary retention Outcome: Progressing   Problem: Pain Managment: Goal: General experience of comfort will improve Outcome: Progressing   Problem: Safety: Goal: Ability to remain free from injury will improve Outcome: Progressing   Problem: Skin Integrity: Goal: Risk for impaired skin integrity will decrease Outcome: Progressing   

## 2023-08-16 NOTE — Progress Notes (Signed)
PT Cancellation Note  Patient Details Name: Martin [name] MRN: [id number] DOB: [birth year]   Cancelled Treatment:     Pt unwilling to even read white/communication board. He shakes his head no, rolls over, and covers his head with blanket. Per discussion with RN and interpreter earlier, pt unwilling to perform much today. Acute PT will continue to follow and progress as able + pt willingness   Rushie Chestnut 08/16/2023, 12:16 PM
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
## 2023-08-16 NOTE — Progress Notes (Signed)
Primary RN communicated with patient with the help of bedside interpreter; patient refused all care this morning except for scheduled eye drop; patient refused refused to have vital signs taken.

## 2023-08-16 NOTE — TOC Progression Note (Signed)
Transition of Care [facility]) - Progression Note    Patient Details  Name: Martin [name] MRN: [id number] Date of Birth: 1956-04-06  [facility] CM/SW Contact  Marlowe Sax, RN Phone Number: 08/16/2023, 12:38 PM  Clinical Narrative:    Went to see the patient with interpreter, he signed that he did not want to be bothered today to go away   Expected Discharge Plan:  (TBD) Barriers to Discharge: Continued Medical Work up  Expected Discharge Plan and Services                                               Social Determinants of Health (SDOH) Interventions SDOH Screenings   Tobacco Use: Low Risk  (04/29/2023)    Readmission Risk Interventions     No data to display

## 2023-08-17 DIAGNOSIS — N451 Epididymitis: Secondary | ICD-10-CM | POA: Diagnosis not present

## 2023-08-17 MED ORDER — MICONAZOLE NITRATE 2 % EX CREA
TOPICAL_CREAM | Freq: Two times a day (BID) | CUTANEOUS | Status: DC
  Filled 2023-08-17: qty 14
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
## 2023-08-17 NOTE — Plan of Care (Signed)
  Problem: Education: Goal: Knowledge of General Education information will improve Description: Including pain rating scale, medication(s)/side effects and non-pharmacologic comfort measures Outcome: Not Progressing   Problem: Activity: Goal: Risk for activity intolerance will decrease Outcome: Not Progressing   Problem: Pain Managment: Goal: General experience of comfort will improve Outcome: Progressing   Problem: Safety: Goal: Ability to remain free from injury will improve Outcome:  Problem: Elimination: Goal: Will not experience complications related to bowel motility Outcome: Progressing Goal: Will not experience complications related to urinary retention Outcome: Progressing  Progressing

## 2023-08-17 NOTE — Progress Notes (Signed)
Refused vital signs this shift.

## 2023-08-18 DIAGNOSIS — N451 Epididymitis: Secondary | ICD-10-CM | POA: Diagnosis not present

## 2023-08-18 NOTE — Plan of Care (Signed)
  Problem: Education: Goal: Knowledge of General Education information will improve Description Including pain rating scale, medication(s)/side effects and non-pharmacologic comfort measures Outcome: Progressing   Problem: Clinical Measurements: Goal: Ability to maintain clinical measurements within normal limits will improve Outcome: Progressing Goal: Will remain free from infection Outcome: Progressing Goal: Cardiovascular complication will be avoided Outcome: Progressing   Problem: Activity: Goal: Risk for activity intolerance will decrease Outcome: Progressing   Problem: Nutrition: Goal: Adequate nutrition will be maintained Outcome: Progressing   Problem: Coping: Goal: Level of anxiety will decrease Outcome: Progressing   Problem: Elimination: Goal: Will not experience complications related to bowel motility Outcome: Progressing Goal: Will not experience complications related to urinary retention Outcome: Progressing   Problem: Pain Managment: Goal: General experience of comfort will improve Outcome: Progressing   Problem: Safety: Goal: Ability to remain free from injury will improve Outcome: Progressing   Problem: Skin Integrity: Goal: Risk for impaired skin integrity will decrease Outcome: Progressing   
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
## 2023-08-18 NOTE — Progress Notes (Signed)
PT Cancellation Note  Patient Details Name: Martin [name] MRN: [id number] DOB: 1956/02/17   Cancelled Treatment:     PT attempt. 2nd attempt this date. First attempt, Thereasa Parkin communicated with pt via white board. He requested I return after interpreter arrived. 2nd attempt, Victorino Dike (ASL interpreter present) but pt remained unwilling to participate." I want to be Dc'd to hotel in [place], [place]. Ill call someone when I get there." Author discussed concerns with how he would manage performing normal everyday ADLs. Pt instantly shuts down and refused to communicate with author further. Pt lacks the insight of current situation or overall safety concerns. Pt has been unwilling to get OOB or perform desired task throughout this week. Acute PT struggling to progress pt to more functional independence due to pt's overall refusal/willingness to participate.   He will require continued all discipline approach to have any chance of success/more independence going forward.    Rushie Chestnut 08/18/2023, 2:42 PM

## 2023-08-19 DIAGNOSIS — N451 Epididymitis: Secondary | ICD-10-CM | POA: Diagnosis not present

## 2023-08-19 NOTE — Plan of Care (Signed)
  Problem: Education: Goal: Knowledge of General Education information will improve Description: Including pain rating scale, medication(s)/side effects and non-pharmacologic comfort measures Outcome: Progressing   Problem: Clinical Measurements: Goal: Ability to maintain clinical measurements within normal limits will improve Outcome: Progressing   Problem: Activity: Goal: Risk for activity intolerance will decrease Outcome: Progressing   Problem: Nutrition: Goal: Adequate nutrition will be maintained Outcome: Progressing   Problem: Coping: Goal: Level of anxiety will decrease Outcome: Progressing   Problem: Pain Managment: Goal: General experience of comfort will improve Outcome: Progressing   Problem: Safety: Goal: Ability to remain free from injury will improve Outcome: Progressing   Problem: Skin Integrity: Goal: Risk for impaired skin integrity will decrease Outcome: Progressing

## 2023-08-19 NOTE — Plan of Care (Signed)
  Problem: Education: Goal: Knowledge of General Education information will improve Description Including pain rating scale, medication(s)/side effects and non-pharmacologic comfort measures Outcome: Progressing   Problem: Clinical Measurements: Goal: Ability to maintain clinical measurements within normal limits will improve Outcome: Progressing Goal: Will remain free from infection Outcome: Progressing Goal: Cardiovascular complication will be avoided Outcome: Progressing   Problem: Activity: Goal: Risk for activity intolerance will decrease Outcome: Progressing   Problem: Nutrition: Goal: Adequate nutrition will be maintained Outcome: Progressing   Problem: Coping: Goal: Level of anxiety will decrease Outcome: Progressing   Problem: Elimination: Goal: Will not experience complications related to bowel motility Outcome: Progressing Goal: Will not experience complications related to urinary retention Outcome: Progressing   Problem: Pain Managment: Goal: General experience of comfort will improve Outcome: Progressing   Problem: Safety: Goal: Ability to remain free from injury will improve Outcome: Progressing   Problem: Skin Integrity: Goal: Risk for impaired skin integrity will decrease Outcome: Progressing   
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
## 2023-08-20 DIAGNOSIS — N451 Epididymitis: Secondary | ICD-10-CM | POA: Diagnosis not present

## 2023-08-20 MED ORDER — MICONAZOLE NITRATE 2 % EX CREA: TOPICAL_CREAM | Freq: Two times a day (BID) | CUTANEOUS | Status: DC | PRN

## 2023-08-20 NOTE — Plan of Care (Signed)
  Problem: Education: Goal: Knowledge of General Education information will improve Description: Including pain rating scale, medication(s)/side effects and non-pharmacologic comfort measures Outcome: Progressing   Problem: Clinical Measurements: Goal: Ability to maintain clinical measurements within normal limits will improve Outcome: Progressing   Problem: Elimination: Goal: Will not experience complications related to bowel motility Outcome: Progressing   Problem: Pain Managment: Goal: General experience of comfort will improve Outcome: Progressing   Problem: Safety: Goal: Ability to remain free from injury will improve Outcome: Progressing   

## 2023-08-20 NOTE — Plan of Care (Signed)
  Problem: Education: Goal: Knowledge of General Education information will improve Description Including pain rating scale, medication(s)/side effects and non-pharmacologic comfort measures Outcome: Progressing   Problem: Clinical Measurements: Goal: Ability to maintain clinical measurements within normal limits will improve Outcome: Progressing Goal: Will remain free from infection Outcome: Progressing Goal: Cardiovascular complication will be avoided Outcome: Progressing   Problem: Activity: Goal: Risk for activity intolerance will decrease Outcome: Progressing   Problem: Nutrition: Goal: Adequate nutrition will be maintained Outcome: Progressing   Problem: Coping: Goal: Level of anxiety will decrease Outcome: Progressing   Problem: Elimination: Goal: Will not experience complications related to bowel motility Outcome: Progressing Goal: Will not experience complications related to urinary retention Outcome: Progressing   Problem: Pain Managment: Goal: General experience of comfort will improve Outcome: Progressing   Problem: Safety: Goal: Ability to remain free from injury will improve Outcome: Progressing   Problem: Skin Integrity: Goal: Risk for impaired skin integrity will decrease Outcome: Progressing   
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
## 2023-08-21 DIAGNOSIS — F4323 Adjustment disorder with mixed anxiety and depressed mood: Secondary | ICD-10-CM | POA: Diagnosis not present
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
## 2023-08-22 DIAGNOSIS — F4323 Adjustment disorder with mixed anxiety and depressed mood: Secondary | ICD-10-CM | POA: Diagnosis not present
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
## 2023-08-22 NOTE — TOC Progression Note (Signed)
Transition of Care [facility]) - Progression Note    Patient Details  Name: Martin [name] MRN: [id number] Date of Birth: 22-Oct-1955  [facility]) CM/SW Contact  Marlowe Sax, RN Phone Number: 08/22/2023, 11:03 AM  Clinical Narrative:    Sent secure email to DSS  Griselda Miner the worker assigned to him, I inquired if they were going to take legal guardianship Awaiting a response    Expected Discharge Plan:  (TBD) Barriers to Discharge: Continued Medical Work up  Expected Discharge Plan and Services                                               Social Determinants of Health (SDOH) Interventions SDOH Screenings   Tobacco Use: Low Risk  (04/29/2023)    Readmission Risk Interventions     No data to display

## 2023-08-23 DIAGNOSIS — D500 Iron deficiency anemia secondary to blood loss (chronic): Secondary | ICD-10-CM | POA: Diagnosis not present

## 2023-08-23 DIAGNOSIS — E039 Hypothyroidism, unspecified: Secondary | ICD-10-CM | POA: Diagnosis not present

## 2023-08-23 DIAGNOSIS — H548 Legal blindness, as defined in USA: Secondary | ICD-10-CM | POA: Diagnosis not present

## 2023-08-23 DIAGNOSIS — N451 Epididymitis: Secondary | ICD-10-CM | POA: Diagnosis not present

## 2023-08-23 NOTE — Plan of Care (Signed)
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
## 2023-08-23 NOTE — TOC Progression Note (Signed)
Transition of Care [facility]) - Progression Note    Patient Details  Name: Martin [name] MRN: [id number] Date of Birth: [birth year]  [facility] of [facility]) CM/SW Contact  Marlowe Sax, RN Phone Number: 08/23/2023, 11:39 AM  Clinical Narrative:    Sent secure email with attached documentation showing the patient does not have the mental capacity to make medical decisions to [facility].  Waiting on a response, she stated that they will start the legal guardian process   Expected Discharge Plan:  (TBD) Barriers to Discharge: Continued Medical Work up  Expected Discharge Plan and Services                                               Social Determinants of Health (SDOH) Interventions SDOH Screenings   Tobacco Use: Low Risk  (04/29/2023)    Readmission Risk Interventions     No data to display
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
## 2023-08-24 DIAGNOSIS — N451 Epididymitis: Secondary | ICD-10-CM | POA: Diagnosis not present

## 2023-08-24 DIAGNOSIS — E039 Hypothyroidism, unspecified: Secondary | ICD-10-CM | POA: Diagnosis not present

## 2023-08-24 DIAGNOSIS — E43 Unspecified severe protein-calorie malnutrition: Secondary | ICD-10-CM

## 2023-08-24 DIAGNOSIS — D500 Iron deficiency anemia secondary to blood loss (chronic): Secondary | ICD-10-CM | POA: Diagnosis not present

## 2023-08-24 DIAGNOSIS — H548 Legal blindness, as defined in USA: Secondary | ICD-10-CM | POA: Diagnosis not present

## 2023-08-24 NOTE — Progress Notes (Signed)
Physical Therapy Treatment Patient Details Name: Martin [name] MRN: [id number] DOB: 1956/03/17 Today's Date: 08/24/2023   History of Present Illness Martin [name] is a 67yoM presenting to hospital 03/30/23 under IVC from residential facility due to behavioral concerns at facility; c/o chronic L knee pain.  PMH includes deafness, impaired vision, OA, hypothyroidism, L knee surgery, ostomy. Patient reporting increased L knee swelling chronic L knee pain. Status post R knee debridement and washout (06/24/23).    PT Comments  Author in room for ~ 1 hour however pt still severely limited by pain and did not tolerate standing attempts. Pt was in very talkative mood with [name] assisting throughout for communication. Pt explained past history with spouse/sibling/ and who he stays in touch with and doesn't. He was able to achieve EOB sitting with supervision only. Attempted to stand but pt unable to fulling stand upright due to c/o severe BLEs pain. Pt remains self limiting. He continues to be educated on the importance of increasing activity daily if he cares to ever be independent/ ambulate again. He states understanding but remains unwilling to try again. Acute PT will continue to follow and progress per current pOC.    If plan is discharge home, recommend the following: A lot of help with walking and/or transfers;A lot of help with bathing/dressing/bathroom;Assistance with cooking/housework;Direct supervision/assist for medications management;Direct supervision/assist for financial management;Assist for transportation;Help with stairs or ramp for entrance;Supervision due to cognitive status     Equipment Recommendations  Rolling walker (2 wheels);BSC/3in1;Wheelchair (measurements PT);Wheelchair cushion (measurements PT)       Precautions / Restrictions Precautions Precautions: Fall Precaution Comments: Legally blind, deaf, Ostomy Restrictions Weight Bearing Restrictions: Yes RLE Weight  Bearing: Weight bearing as tolerated RLE Partial Weight Bearing Percentage or Pounds:  (per verbal dr Hyacinth Meeker)     Mobility  Bed Mobility Overal bed mobility: Needs Assistance Bed Mobility: Supine to Sit, Sit to Supine Rolling: Supervision, Used rails Supine to sit: Supervision Sit to supine: Supervision   Transfers  General transfer comment: Pt attempted to stand one time however unable to tolerate wt and returned to supine in bed after only sitting EOB x a few minutes       Balance Overall balance assessment: Needs assistance Sitting-balance support: Feet supported, Bilateral upper extremity supported Sitting balance-Leahy Scale: Good Sitting balance - Comments: no physical assistance required to maintain balance at EOB while he adjusted sheets       Standing balance comment: unable to tolerate due to pain       Cognition Arousal: Alert Behavior During Therapy: WFL for tasks assessed/performed Overall Cognitive Status: Within Functional Limits for tasks assessed      General Comments: Pt is alert and much more conversational throughout session. Pt basically gave Thereasa Parkin his life story and explained why he doesn't stay in touch with them. Pt overall remains self limiting/ pain limited        Exercises General Exercises - Lower Extremity Ankle Circles/Pumps: AROM, 10 reps Quad Sets: AROM, 10 reps Heel Slides: AAROM, 5 reps Hip ABduction/ADduction: AAROM, 5 reps        Pertinent Vitals/Pain Pain Assessment Pain Assessment: PAINAD Breathing: normal Negative Vocalization: occasional moan/groan, low speech, negative/disapproving quality Facial Expression: smiling or inexpressive Body Language: relaxed Consolability: no need to console PAINAD Score: 1 Pain Location: L quad today Pain Descriptors / Indicators: Moaning, Guarding, Grimacing Pain Intervention(s): Limited activity within patient's tolerance, Monitored during session, Premedicated before session,  Repositioned     PT Goals (current goals  can now be found in the care plan section) Acute Rehab PT Goals Patient Stated Goal: none stated Progress towards PT goals: Not progressing toward goals - comment (self limiting and pain limited)    Frequency    Min 1X/week      PT Plan Current plan remains appropriate       AM-PAC PT "6 Clicks" Mobility   Outcome Measure  Help needed turning from your back to your side while in a flat bed without using bedrails?: None Help needed moving from lying on your back to sitting on the side of a flat bed without using bedrails?: None Help needed moving to and from a bed to a chair (including a wheelchair)?: A Lot Help needed standing up from a chair using your arms (e.g., wheelchair or bedside chair)?: A Lot Help needed to walk in hospital room?: A Lot Help needed climbing 3-5 steps with a railing? : A Lot 6 Click Score: 16    End of Session   Activity Tolerance: Patient limited by pain Patient left: in bed;with call bell/phone within reach;with bed alarm set Nurse Communication: Mobility status PT Visit Diagnosis: Other abnormalities of gait and mobility (R26.89);Muscle weakness (generalized) (M62.81);Difficulty in walking, not elsewhere classified (R26.2);Pain Pain - Right/Left: Right Pain - part of body: Knee     Time: 1102-1204 PT Time Calculation (min) (ACUTE ONLY): 62 min  Charges:    $Therapeutic Exercise: 8-22 mins $Therapeutic Activity: 8-22 mins PT General Charges $$ ACUTE PT VISIT: 1 Visit                    Jetta Lout PTA 08/24/23, 12:41 PM
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
## 2023-08-24 NOTE — Plan of Care (Signed)

## 2023-08-25 DIAGNOSIS — E039 Hypothyroidism, unspecified: Secondary | ICD-10-CM | POA: Diagnosis not present

## 2023-08-25 DIAGNOSIS — E43 Unspecified severe protein-calorie malnutrition: Secondary | ICD-10-CM | POA: Diagnosis present

## 2023-08-25 DIAGNOSIS — H548 Legal blindness, as defined in USA: Secondary | ICD-10-CM | POA: Diagnosis not present

## 2023-08-25 DIAGNOSIS — E46 Unspecified protein-calorie malnutrition: Secondary | ICD-10-CM | POA: Diagnosis present

## 2023-08-25 DIAGNOSIS — D500 Iron deficiency anemia secondary to blood loss (chronic): Secondary | ICD-10-CM | POA: Diagnosis not present

## 2023-08-25 DIAGNOSIS — N451 Epididymitis: Secondary | ICD-10-CM | POA: Diagnosis not present
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
## 2023-08-25 NOTE — Plan of Care (Signed)
°  Problem: Clinical Measurements: Goal: Will remain free from infection Outcome: Progressing   Problem: Nutrition: Goal: Adequate nutrition will be maintained Outcome: Progressing   Problem: Coping: Goal: Level of anxiety will decrease Outcome: Progressing

## 2023-08-25 NOTE — Progress Notes (Signed)
[name], ASL interpretor at bedside assisting with communication. Patient agreed to take levothyroxine, multivitamin and ensure but refuses all other meds including iron. Patient denies pain. Patient refuses to allow RN to remove his personal items from bed so that he can be weighed. Need for weight explained to patient with interpretor's help and patient refused.
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
## 2023-08-25 NOTE — TOC Progression Note (Addendum)
Transition of Care [facility]) - Progression Note    Patient Details  Name: Martin [name] MRN: [id number] Date of Birth: 08-Jun-1956  Transition of [facility]) CM/SW Contact  Marlowe Sax, RN Phone Number: 08/25/2023, 11:50 AM  Clinical Narrative:    [place] DSS Aniya came and let me know that they will be seeking Guardianship of the patient, the are awaiting a court date   Expected Discharge Plan:  (TBD) Barriers to Discharge: Continued Medical Work up  Expected Discharge Plan and Services                                               Social Determinants of Health (SDOH) Interventions SDOH Screenings   Tobacco Use: Low Risk  (04/29/2023)    Readmission Risk Interventions     No data to display

## 2023-08-25 NOTE — Plan of Care (Signed)

## 2023-08-26 DIAGNOSIS — H548 Legal blindness, as defined in USA: Secondary | ICD-10-CM | POA: Diagnosis not present

## 2023-08-26 DIAGNOSIS — N451 Epididymitis: Secondary | ICD-10-CM | POA: Diagnosis not present

## 2023-08-26 DIAGNOSIS — D500 Iron deficiency anemia secondary to blood loss (chronic): Secondary | ICD-10-CM | POA: Diagnosis not present

## 2023-08-26 DIAGNOSIS — E039 Hypothyroidism, unspecified: Secondary | ICD-10-CM | POA: Diagnosis not present

## 2023-08-26 MED ORDER — DIPHENHYDRAMINE HCL 25 MG PO CAPS
25.0000 mg | ORAL_CAPSULE | Freq: Every evening | ORAL | Status: DC | PRN
  Administered 2023-08-28 – 2023-09-01 (×3): 25 mg via ORAL
  Filled 2023-08-26 (×3): qty 1
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
## 2023-08-26 NOTE — Plan of Care (Signed)
Pt refused assessment, vitals and medicine. Pt was offered as few times. Problem: Education: Goal: Knowledge of General Education information will improve Description: Including pain rating scale, medication(s)/side effects and non-pharmacologic comfort measures Outcome: Completed/Met   Problem: Clinical Measurements: Goal: Ability to maintain clinical measurements within normal limits will improve Outcome: Completed/Met Goal: Will remain free from infection Outcome: Completed/Met Goal: Cardiovascular complication will be avoided Outcome: Completed/Met   Problem: Activity: Goal: Risk for activity intolerance will decrease Outcome: Completed/Met   Problem: Nutrition: Goal: Adequate nutrition will be maintained Outcome: Completed/Met   Problem: Coping: Goal: Level of anxiety will decrease Outcome: Completed/Met   Problem: Elimination: Goal: Will not experience complications related to bowel motility Outcome: Completed/Met Goal: Will not experience complications related to urinary retention Outcome: Completed/Met   Problem: Pain Managment: Goal: General experience of comfort will improve Outcome: Completed/Met   Problem: Safety: Goal: Ability to remain free from injury will improve Outcome: Completed/Met   Problem: Skin Integrity: Goal: Risk for impaired skin integrity will decrease Outcome: Completed/Met

## 2023-08-26 NOTE — Plan of Care (Signed)
   Goal: Pt Will Ambulate Outcome: Adequate for Discharge   Problem: Malnutrition  (NI-5.2) Goal: Food and/or nutrient delivery Description: Individualized approach for food/nutrient provision. Outcome: Adequate for Discharge

## 2023-08-26 NOTE — Progress Notes (Signed)
Patient refused assessment and vitals this morning.

## 2023-08-27 DIAGNOSIS — H548 Legal blindness, as defined in USA: Secondary | ICD-10-CM | POA: Diagnosis not present

## 2023-08-27 DIAGNOSIS — N451 Epididymitis: Secondary | ICD-10-CM | POA: Diagnosis not present

## 2023-08-27 DIAGNOSIS — D500 Iron deficiency anemia secondary to blood loss (chronic): Secondary | ICD-10-CM | POA: Diagnosis not present

## 2023-08-27 DIAGNOSIS — E039 Hypothyroidism, unspecified: Secondary | ICD-10-CM | POA: Diagnosis not present

## 2023-08-27 NOTE — Progress Notes (Signed)
patient stated he would like to take his medications when the interpreter is in after lunch. He is requesting only his thyroid medication and multivitamins. he also said he hates when someone wakes him up on mornings to check vitals, if he is asleep let him sleep. Vitals and assessments can be done when interpreter is in his room because he wants to know what is being done. He is not fond of the white board.
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
## 2023-08-28 DIAGNOSIS — H9193 Unspecified hearing loss, bilateral: Secondary | ICD-10-CM | POA: Diagnosis not present

## 2023-08-28 DIAGNOSIS — Z933 Colostomy status: Secondary | ICD-10-CM | POA: Diagnosis not present

## 2023-08-28 DIAGNOSIS — N451 Epididymitis: Secondary | ICD-10-CM | POA: Diagnosis not present

## 2023-08-28 DIAGNOSIS — F4323 Adjustment disorder with mixed anxiety and depressed mood: Secondary | ICD-10-CM | POA: Diagnosis not present

## 2023-08-28 MED ORDER — ORAL CARE MOUTH RINSE: 15.0000 mL | OROMUCOSAL | Status: DC | PRN

## 2023-08-28 NOTE — TOC Progression Note (Signed)
Transition of Care [facility]) - Progression Note    Patient Details  Name: Yaasir Trotta MRN: [id number] Date of Birth: 07-Sep-1956  Transition of Care [facility]) CM/SW Contact  Marlowe Sax, RN Phone Number: 08/28/2023, 2:35 PM  Clinical Narrative:     Laurey Morale the director [facility]; communication services for deaf & hard of hearing I notified her that [facility] DSS is seeking legal guardianship, she requested that I send them her contact information, emailed to [employer] at [employer]  Expected Discharge Plan:  (TBD) Barriers to Discharge: Continued Medical Work up  Expected Discharge Plan and Services                                               Social Determinants of Health (SDOH) Interventions SDOH Screenings   Tobacco Use: Low Risk  (04/29/2023)    Readmission Risk Interventions     No data to display
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
## 2023-08-29 DIAGNOSIS — F4323 Adjustment disorder with mixed anxiety and depressed mood: Secondary | ICD-10-CM | POA: Diagnosis not present

## 2023-08-29 DIAGNOSIS — N451 Epididymitis: Secondary | ICD-10-CM | POA: Diagnosis not present

## 2023-08-29 DIAGNOSIS — H9193 Unspecified hearing loss, bilateral: Secondary | ICD-10-CM | POA: Diagnosis not present

## 2023-08-29 DIAGNOSIS — Z933 Colostomy status: Secondary | ICD-10-CM | POA: Diagnosis not present

## 2023-08-29 NOTE — Progress Notes (Signed)
PT Cancellation Note  Patient Details Name: Martin [name] MRN: [id number] DOB: October 15, 1955   Cancelled Treatment:    Reason Eval/Treat Not Completed:  (Treatment session attempted; missed time with interpreter. Will re-attempt at later time/date as medically appropriate and interpreter available.)   Alanya Vukelich H. Manson Passey, PT, DPT, NCS 08/29/23, 2:49 PM [phone]
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
## 2023-08-29 NOTE — Plan of Care (Signed)
  Problem: Safety: Goal: Ability to remain free from injury will improve Outcome: Progressing   

## 2023-08-30 DIAGNOSIS — N451 Epididymitis: Secondary | ICD-10-CM | POA: Diagnosis not present

## 2023-08-30 DIAGNOSIS — F4323 Adjustment disorder with mixed anxiety and depressed mood: Secondary | ICD-10-CM | POA: Diagnosis not present

## 2023-08-30 DIAGNOSIS — M009 Pyogenic arthritis, unspecified: Secondary | ICD-10-CM | POA: Diagnosis not present

## 2023-08-30 DIAGNOSIS — Z933 Colostomy status: Secondary | ICD-10-CM | POA: Diagnosis not present

## 2023-08-30 NOTE — Progress Notes (Signed)
Patient refused all care today with the exception of eye drops. This nurse with the NT and others from the team continue to monitor him to ensure his safety.

## 2023-08-30 NOTE — TOC Progression Note (Signed)
Transition of Care [facility]) - Progression Note    Patient Details  Name: Martin [name] MRN: [id number] Date of Birth: [birth year]  Transition of Care [facility]) CM/SW Contact  Marlowe Sax, RN Phone Number: 08/30/2023, 2:46 PM  Clinical Narrative:    The patient continues to refuse to speak to or interact with most caregivers including myself, he is still refusing to work with PT, tells everyone to go away   Expected Discharge Plan:  (TBD) Barriers to Discharge: Continued Medical Work up  Expected Discharge Plan and Services                                               Social Determinants of Health (SDOH) Interventions SDOH Screenings   Food Insecurity: Patient Declined (08/29/2023)  Housing: Patient Declined (08/29/2023)  Transportation Needs: Patient Declined (08/29/2023)  Utilities: Patient Declined (08/29/2023)  Tobacco Use: Low Risk  (04/29/2023)    Readmission Risk Interventions     No data to display
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
## 2023-08-30 NOTE — Plan of Care (Signed)
  Problem: Safety: Goal: Ability to remain free from injury will improve Outcome: Progressing   
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
## 2023-08-31 DIAGNOSIS — F4323 Adjustment disorder with mixed anxiety and depressed mood: Secondary | ICD-10-CM | POA: Diagnosis not present

## 2023-08-31 DIAGNOSIS — N451 Epididymitis: Secondary | ICD-10-CM | POA: Diagnosis not present

## 2023-08-31 DIAGNOSIS — M009 Pyogenic arthritis, unspecified: Secondary | ICD-10-CM | POA: Diagnosis not present

## 2023-08-31 DIAGNOSIS — Z933 Colostomy status: Secondary | ICD-10-CM | POA: Diagnosis not present

## 2023-08-31 NOTE — Plan of Care (Signed)
  Problem: Safety: Goal: Ability to remain free from injury will improve Outcome: Progressing   
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
## 2023-09-01 DIAGNOSIS — M009 Pyogenic arthritis, unspecified: Secondary | ICD-10-CM | POA: Diagnosis not present

## 2023-09-01 DIAGNOSIS — I9589 Other hypotension: Secondary | ICD-10-CM | POA: Diagnosis not present

## 2023-09-01 DIAGNOSIS — N451 Epididymitis: Secondary | ICD-10-CM | POA: Diagnosis not present

## 2023-09-01 DIAGNOSIS — F4320 Adjustment disorder, unspecified: Secondary | ICD-10-CM | POA: Diagnosis not present

## 2023-09-01 MED ORDER — HYDROXYZINE HCL 25 MG PO TABS
25.0000 mg | ORAL_TABLET | Freq: Three times a day (TID) | ORAL | Status: DC | PRN
  Filled 2023-09-01: qty 1

## 2023-09-01 MED ORDER — DIPHENHYDRAMINE HCL 25 MG PO CAPS
50.0000 mg | ORAL_CAPSULE | Freq: Every evening | ORAL | Status: DC | PRN
  Filled 2023-09-01: qty 2

## 2023-09-01 MED ORDER — DIPHENHYDRAMINE HCL 25 MG PO CAPS
25.0000 mg | ORAL_CAPSULE | Freq: Once | ORAL | Status: AC
  Administered 2023-09-01: 25 mg via ORAL
  Filled 2023-09-01 (×2): qty 1

## 2023-09-01 NOTE — Progress Notes (Signed)
Patient refused vitals today. Patient refused most medications with the exception of Benadryl and eye drops.   Of note, patient did let nurse complete an assessment.

## 2023-09-01 NOTE — Plan of Care (Signed)
  Problem: Safety: Goal: Ability to remain free from injury will improve Outcome: Progressing   

## 2023-09-01 NOTE — Progress Notes (Signed)
Pt had requested specifically to have his benadryl 25 mg to be changed to 50 mg. He was also offered other medications such as Atrax and klonopin which he had refused in the earlier shift.  Provider was requested per patients request to increase dose of benadryl to 50 mg.  Order of 50 mg was given. Provider came to evaluate patient as well. Pt informed Manuela Schwartz NP that he is in pain. We discussed

## 2023-09-01 NOTE — Progress Notes (Addendum)
PT Cancellation Note  Patient Details Name: Martin [name] MRN: [id number] DOB: 1956/06/27   Cancelled Treatment:    Pt was long sitting in bed watching videos on phone upon arrival. Communicated via whiteboard and interpreter when here earlier in the day.  Author has personally attempted to motivate and encourage pt to participate in PT/OOB activity ~ 10 times in past three days. Pt remains unwilling and with constant encouragement eventually refuses to communicate with author.    Rushie Chestnut 09/01/2023, 2:31 PM
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
## 2023-09-02 DIAGNOSIS — M009 Pyogenic arthritis, unspecified: Secondary | ICD-10-CM | POA: Diagnosis not present

## 2023-09-02 DIAGNOSIS — F4323 Adjustment disorder with mixed anxiety and depressed mood: Secondary | ICD-10-CM | POA: Diagnosis not present

## 2023-09-02 DIAGNOSIS — Z933 Colostomy status: Secondary | ICD-10-CM | POA: Diagnosis not present

## 2023-09-02 DIAGNOSIS — N451 Epididymitis: Secondary | ICD-10-CM | POA: Diagnosis not present

## 2023-09-02 MED ORDER — DIPHENHYDRAMINE HCL 25 MG PO CAPS
50.0000 mg | ORAL_CAPSULE | Freq: Four times a day (QID) | ORAL | Status: DC | PRN
  Administered 2023-09-11 – 2023-09-13 (×2): 50 mg via ORAL
  Filled 2023-09-02 (×2): qty 2

## 2023-09-02 NOTE — Plan of Care (Signed)
  Problem: Safety: Goal: Ability to remain free from injury will improve Outcome: Progressing   

## 2023-09-02 NOTE — Plan of Care (Signed)
  Problem: Safety: Goal: Ability to remain free from injury will improve 09/02/2023 2334 by Dorthula Nettles, RN Outcome: Progressing 09/02/2023 2306 by Dorthula Nettles, RN Outcome: Progressing
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
## 2023-09-03 DIAGNOSIS — F4323 Adjustment disorder with mixed anxiety and depressed mood: Secondary | ICD-10-CM | POA: Diagnosis not present

## 2023-09-03 DIAGNOSIS — N451 Epididymitis: Secondary | ICD-10-CM | POA: Diagnosis not present

## 2023-09-03 DIAGNOSIS — E039 Hypothyroidism, unspecified: Secondary | ICD-10-CM | POA: Diagnosis not present

## 2023-09-03 DIAGNOSIS — M009 Pyogenic arthritis, unspecified: Secondary | ICD-10-CM | POA: Diagnosis not present

## 2023-09-03 NOTE — Plan of Care (Signed)
Problem: Safety: Goal: Ability to remain free from injury will improve Outcome: Progressing

## 2023-09-03 NOTE — Progress Notes (Signed)
PT Cancellation Note  Patient Details Name: Martin [name] MRN: [id number] DOB: 06-06-1956   Cancelled Treatment:     Patient consistently refusing participation with PT session across multiple sessions.  Have attempted to accommodate and encourage patient in all avenues--different providers, different times of day, different session goals (allowing patient to request/dictate as appropriate), different communication methods.  Patient persistently refusing despite all efforts; at times, completely disengaging and refusing to acknowledge/interact with therapist.  Given persistent refusals, will discontinue current therapy orders and plan of care.  If patient re-engages with care plan and demonstrates consistent participation with care, please re-consult therapy as appropriate.  At this time, anticipate patient to be WC level as primary mobility at discharge, requiring 24-hour care/support for optimal safety and outcomes.  Kennedey Digilio H. Manson Passey, PT, DPT, NCS 09/03/23, 11:35 PM [phone]
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
## 2023-09-04 DIAGNOSIS — F4320 Adjustment disorder, unspecified: Secondary | ICD-10-CM | POA: Diagnosis not present

## 2023-09-04 DIAGNOSIS — M009 Pyogenic arthritis, unspecified: Secondary | ICD-10-CM | POA: Diagnosis not present

## 2023-09-04 DIAGNOSIS — F4323 Adjustment disorder with mixed anxiety and depressed mood: Secondary | ICD-10-CM | POA: Diagnosis not present

## 2023-09-04 DIAGNOSIS — N451 Epididymitis: Secondary | ICD-10-CM | POA: Diagnosis not present
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
## 2023-09-05 DIAGNOSIS — N451 Epididymitis: Secondary | ICD-10-CM | POA: Diagnosis not present

## 2023-09-05 DIAGNOSIS — F4323 Adjustment disorder with mixed anxiety and depressed mood: Secondary | ICD-10-CM | POA: Diagnosis not present

## 2023-09-05 DIAGNOSIS — F4320 Adjustment disorder, unspecified: Secondary | ICD-10-CM | POA: Diagnosis not present

## 2023-09-05 DIAGNOSIS — Z933 Colostomy status: Secondary | ICD-10-CM | POA: Diagnosis not present
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
## 2023-09-05 NOTE — Plan of Care (Signed)
  Problem: Safety: Goal: Ability to remain free from injury will improve 09/05/2023 2302 by Thayer Jew, RN Outcome: Progressing 09/05/2023 2302 by Thayer Jew, RN Outcome: Progressing

## 2023-09-05 NOTE — Plan of Care (Signed)
  Problem: Safety: Goal: Ability to remain free from injury will improve Outcome: Progressing   

## 2023-09-06 DIAGNOSIS — N451 Epididymitis: Secondary | ICD-10-CM | POA: Diagnosis not present

## 2023-09-06 NOTE — Plan of Care (Signed)
  Problem: Safety: Goal: Ability to remain free from injury will improve Outcome: Progressing   
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
## 2023-09-07 DIAGNOSIS — H9193 Unspecified hearing loss, bilateral: Secondary | ICD-10-CM | POA: Diagnosis not present

## 2023-09-07 DIAGNOSIS — H548 Legal blindness, as defined in USA: Secondary | ICD-10-CM | POA: Diagnosis not present

## 2023-09-07 DIAGNOSIS — E43 Unspecified severe protein-calorie malnutrition: Secondary | ICD-10-CM | POA: Diagnosis not present

## 2023-09-07 DIAGNOSIS — F4320 Adjustment disorder, unspecified: Secondary | ICD-10-CM | POA: Diagnosis not present

## 2023-09-07 NOTE — Plan of Care (Signed)
  Problem: Safety: Goal: Ability to remain free from injury will improve Outcome: Progressing   
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
## 2023-09-08 DIAGNOSIS — H548 Legal blindness, as defined in USA: Secondary | ICD-10-CM | POA: Diagnosis not present

## 2023-09-08 DIAGNOSIS — E43 Unspecified severe protein-calorie malnutrition: Secondary | ICD-10-CM | POA: Diagnosis not present

## 2023-09-08 DIAGNOSIS — F4320 Adjustment disorder, unspecified: Secondary | ICD-10-CM | POA: Diagnosis not present

## 2023-09-08 DIAGNOSIS — H9193 Unspecified hearing loss, bilateral: Secondary | ICD-10-CM | POA: Diagnosis not present

## 2023-09-08 NOTE — Plan of Care (Signed)
  Problem: Safety: Goal: Ability to remain free from injury will improve 09/08/2023 1510 by Frann Rider D, LPN Outcome: Progressing 09/08/2023 1510 by Frann Rider D, LPN Outcome: Progressing
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
## 2023-09-08 NOTE — Plan of Care (Signed)
  Problem: Safety: Goal: Ability to remain free from injury will improve Outcome: Progressing   

## 2023-09-09 DIAGNOSIS — H9193 Unspecified hearing loss, bilateral: Secondary | ICD-10-CM | POA: Diagnosis not present

## 2023-09-09 DIAGNOSIS — H548 Legal blindness, as defined in USA: Secondary | ICD-10-CM | POA: Diagnosis not present

## 2023-09-09 DIAGNOSIS — F4320 Adjustment disorder, unspecified: Secondary | ICD-10-CM | POA: Diagnosis not present

## 2023-09-09 DIAGNOSIS — E43 Unspecified severe protein-calorie malnutrition: Secondary | ICD-10-CM | POA: Diagnosis not present

## 2023-09-09 NOTE — Plan of Care (Signed)
  Problem: Safety: Goal: Ability to remain free from injury will improve Outcome: Progressing   
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
## 2023-09-09 NOTE — Progress Notes (Signed)
Patient asked for bath. Staff has washed patient twice this shift and applied lotion. Patient continues to ask for repeated baths. Explained to patient that he has been bathed and will not do another bath at this time. Patient snatched board from staff and threw cups and other items at staff. Staff is concerned for safety. Patient and staff are utilizing the white board for communication. Staff has picked up multiple items patient has thrown in the floor by patient.

## 2023-09-10 DIAGNOSIS — F4320 Adjustment disorder, unspecified: Secondary | ICD-10-CM | POA: Diagnosis not present

## 2023-09-10 DIAGNOSIS — E43 Unspecified severe protein-calorie malnutrition: Secondary | ICD-10-CM | POA: Diagnosis not present

## 2023-09-10 DIAGNOSIS — H9193 Unspecified hearing loss, bilateral: Secondary | ICD-10-CM | POA: Diagnosis not present

## 2023-09-10 DIAGNOSIS — H548 Legal blindness, as defined in USA: Secondary | ICD-10-CM | POA: Diagnosis not present

## 2023-09-10 NOTE — Plan of Care (Signed)
  Problem: Safety: Goal: Ability to remain free from injury will improve Outcome: Progressing   
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
## 2023-09-11 DIAGNOSIS — E43 Unspecified severe protein-calorie malnutrition: Secondary | ICD-10-CM | POA: Diagnosis not present

## 2023-09-11 DIAGNOSIS — H548 Legal blindness, as defined in USA: Secondary | ICD-10-CM | POA: Diagnosis not present

## 2023-09-11 DIAGNOSIS — H9193 Unspecified hearing loss, bilateral: Secondary | ICD-10-CM | POA: Diagnosis not present

## 2023-09-11 DIAGNOSIS — F4320 Adjustment disorder, unspecified: Secondary | ICD-10-CM | POA: Diagnosis not present
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
## 2023-09-11 NOTE — Progress Notes (Signed)
Patient refused his vitals to be taken. Patient educated. We continue to monitor.

## 2023-09-12 DIAGNOSIS — F4320 Adjustment disorder, unspecified: Secondary | ICD-10-CM | POA: Diagnosis not present

## 2023-09-12 DIAGNOSIS — H548 Legal blindness, as defined in USA: Secondary | ICD-10-CM | POA: Diagnosis not present

## 2023-09-12 DIAGNOSIS — E43 Unspecified severe protein-calorie malnutrition: Secondary | ICD-10-CM | POA: Diagnosis not present

## 2023-09-12 DIAGNOSIS — H9193 Unspecified hearing loss, bilateral: Secondary | ICD-10-CM | POA: Diagnosis not present

## 2023-09-12 NOTE — Plan of Care (Signed)
  Problem: Safety: Goal: Ability to remain free from injury will improve Outcome: Progressing   
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
## 2023-09-13 ENCOUNTER — Inpatient Hospital Stay: Payer: Medicare Other

## 2023-09-13 DIAGNOSIS — N451 Epididymitis: Secondary | ICD-10-CM | POA: Diagnosis not present

## 2023-09-13 NOTE — Plan of Care (Signed)
  Problem: Safety: Goal: Ability to remain free from injury will improve Outcome: Progressing   

## 2023-09-13 NOTE — Progress Notes (Signed)
Pt refused VS check and allowed only minimal physical assessment. Pt nodded appropriately when asked written questions via his bedside whiteboard. Will continue to monitor closely.
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
## 2023-09-14 DIAGNOSIS — N451 Epididymitis: Secondary | ICD-10-CM | POA: Diagnosis not present

## 2023-09-14 MED ORDER — CEFAZOLIN SODIUM-DEXTROSE 2-4 GM/100ML-% IV SOLN
INTRAVENOUS | Status: AC
Start: 2023-09-14 — End: ?
  Filled 2023-09-14: qty 100
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
## 2023-09-14 NOTE — Plan of Care (Signed)
  Problem: Safety: Goal: Ability to remain free from injury will improve Outcome: Progressing   

## 2023-09-15 DIAGNOSIS — N451 Epididymitis: Secondary | ICD-10-CM | POA: Diagnosis not present
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
## 2023-09-15 NOTE — Plan of Care (Signed)
  Problem: Safety: Goal: Ability to remain free from injury will improve 09/15/2023 1543 by Grayland Ormond, LPN Outcome: Progressing

## 2023-09-16 DIAGNOSIS — N451 Epididymitis: Secondary | ICD-10-CM | POA: Diagnosis not present
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
## 2023-09-16 NOTE — Plan of Care (Signed)
  Problem: Safety: Goal: Ability to remain free from injury will improve Outcome: Progressing   

## 2023-09-17 DIAGNOSIS — N451 Epididymitis: Secondary | ICD-10-CM | POA: Diagnosis not present
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
## 2023-09-18 DIAGNOSIS — N451 Epididymitis: Secondary | ICD-10-CM | POA: Diagnosis not present

## 2023-09-18 NOTE — Progress Notes (Signed)
Pt refused VS and physical assessment.
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
## 2023-09-18 NOTE — Plan of Care (Signed)
  Problem: Safety: Goal: Ability to remain free from injury will improve Outcome: Progressing   

## 2023-09-19 DIAGNOSIS — N451 Epididymitis: Secondary | ICD-10-CM | POA: Diagnosis not present
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
## 2023-09-19 NOTE — Plan of Care (Signed)
  Problem: Safety: Goal: Ability to remain free from injury will improve Outcome: Progressing   

## 2023-09-19 NOTE — TOC Progression Note (Signed)
Transition of Care [facility]) - Progression Note    Patient Details  Name: Martin [name] MRN: [id number] Date of Birth: 1956-01-21  Transition of Care [facility]) CM/SW Contact  Marlowe Sax, RN Phone Number: 09/19/2023, 12:06 PM  Clinical Narrative:    TOC continues to follow the patient and will help with DC Dispostion, to this point the patient has refused all plans, DSS is waiting on court hearing to take legal guardianship   Expected Discharge Plan:  (TBD) Barriers to Discharge: Continued Medical Work up  Expected Discharge Plan and Services                                               Social Determinants of Health (SDOH) Interventions SDOH Screenings   Food Insecurity: Patient Declined (08/29/2023)  Housing: Patient Declined (08/29/2023)  Transportation Needs: Patient Declined (08/29/2023)  Utilities: Patient Declined (08/29/2023)  Tobacco Use: Low Risk  (04/29/2023)    Readmission Risk Interventions     No data to display

## 2023-09-19 NOTE — TOC Progression Note (Signed)
Transition of Care [facility]) - Progression Note    Patient Details  Name: Martin [name] MRN: [id number] Date of Birth: 11-Apr-1956  Transition of Care [facility]) CM/SW Contact  Marlowe Sax, RN Phone Number: 09/19/2023, 1:14 PM  Clinical Narrative:    Sent secure email to [facility] at DSS asking if they can help him with his medicaid that is about to Lapse, awaiting a responce   Expected Discharge Plan:  (TBD) Barriers to Discharge: Continued Medical Work up  Expected Discharge Plan and Services                                               Social Determinants of Health (SDOH) Interventions SDOH Screenings   Food Insecurity: Patient Declined (08/29/2023)  Housing: Patient Declined (08/29/2023)  Transportation Needs: Patient Declined (08/29/2023)  Utilities: Patient Declined (08/29/2023)  Tobacco Use: Low Risk  (04/29/2023)    Readmission Risk Interventions     No data to display

## 2023-09-20 DIAGNOSIS — N451 Epididymitis: Secondary | ICD-10-CM | POA: Diagnosis not present

## 2023-09-20 MED ORDER — DICLOFENAC SODIUM 1 % EX GEL
4.0000 g | Freq: Four times a day (QID) | CUTANEOUS | Status: DC
  Administered 2023-09-20 – 2024-02-13 (×24): 4 g via TOPICAL
  Filled 2023-09-20: qty 100
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
## 2023-09-20 NOTE — TOC Progression Note (Signed)
Transition of Care [facility]) - Progression Note    Patient Details  Name: Martin [name] MRN: [id number] Date of Birth: 1956/04/15  Transition of Care [facility]) CM/SW Contact  Marlowe Sax, RN Phone Number: 09/20/2023, 10:27 AM  Clinical Narrative:    The patient has requested to see me, I contacted Interpreter services and am waiting on their arrival to communicate with the patient,  I sent a request to the financial assistance department asking them to help him renew his medicaid  Received a email from [place] at DSS stating they have submitted for legal guardianship and are waiting for next steps  Expected Discharge Plan:  (TBD) Barriers to Discharge: Continued Medical Work up  Expected Discharge Plan and Services                                               Social Determinants of Health (SDOH) Interventions SDOH Screenings   Food Insecurity: Patient Declined (08/29/2023)  Housing: Patient Declined (08/29/2023)  Transportation Needs: Patient Declined (08/29/2023)  Utilities: Patient Declined (08/29/2023)  Tobacco Use: Low Risk  (04/29/2023)    Readmission Risk Interventions     No data to display
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
## 2023-09-20 NOTE — Consult Note (Signed)
WOC consulted to re-evaluate ostomy.  Patient agitated, pushing nurses hand away when attempting to tap on right shoulder (as sign instructs).  Decided to attempt later with assistance from bedside nurse.   Came back to room 2 hours later with charge nurse and attempted to see.  Patient putting lotion on legs, WOC RN wrote out on a piece of paper why I was there and attempted to show patient, patient slapped at paper and pulled covers back up.    WOC team will attempt at later time when patient is in a more agreeable mood. MD notified of above.   Thanks,  [employer] MSN, RN-BC, [employer] [phone]
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
## 2023-09-21 DIAGNOSIS — N451 Epididymitis: Secondary | ICD-10-CM | POA: Diagnosis not present

## 2023-09-21 NOTE — Consult Note (Addendum)
WOC Nurse ostomy consult note; see previous consult notes 6/21 and 7/5  Stoma type/location:  LMQ colostomy  Stomal assessment/size: 1 1/2" pale, pink minimal prolapse at today's visit  Peristomal assessment: current skin barrier/pouch placed 09/20/2023, did not remove as intact at time of this visit, this had been cut wide and skin immediately around stoma appears intact  Treatment options for stomal/peristomal skin: 2" barrier ring  Output flatus in pouch only at time of this visit  Ostomy pouching: 2 piece 2 1/4" skin barrier [facility] [phone]), 2 1/4" pouch [facility] [place] [phone]) and 2" barrier ring [facility] [place] [phone])   Education provided: none, patient is dependent with ostomy care  Enrolled patient in [employer] DC program: no established ostomy   For nursing staff skin barrier needs to be cut at 1 1/2", no larger. Should always place a 2" barrier ring around stoma as this is extra skin protection and prevents breakdown.   I placed (4) sets of the 2 1/4" skin barrier, pouch and barrier rings at bedside.   Interpreter in room at time of visit and greatly appreciate his assistance.   Thank you,    Priscella Mann MSN, RN-BC, [employer] [phone]

## 2023-09-21 NOTE — Plan of Care (Signed)
  Problem: Safety: Goal: Ability to remain free from injury will improve Outcome: Progressing   

## 2023-09-21 NOTE — Plan of Care (Signed)
Patient had positive session with physical therapy with assistance from ASL interpreter. Was willing to take some of his medications and allowed nursing staff to listen to his heart and lungs. Did not want nursing staff to check his vital signs.   Problem: Safety: Goal: Ability to remain free from injury will improve 09/21/2023 1433 by Ivor Reining, RN Outcome: Progressing 09/21/2023 1433 by Ivor Reining, RN Outcome: Progressing

## 2023-09-21 NOTE — Evaluation (Signed)
Physical Therapy ReEvaluation Patient Details Name: Martin [name] MRN: [id number] DOB: [birth year] Today's Date: 09/21/2023  History of Present Illness  Martin [name] is a 67yoM presenting to hospital 03/30/23 under IVC from residential facility due to behavioral concerns at facility; c/o chronic L knee pain.  PMH includes deafness, impaired vision, OA, hypothyroidism, L knee surgery, ostomy. Patient reporting increased L knee swelling chronic L knee pain. Status post R knee debridement and washout (06/24/23).  Clinical Impression  Pt willing to work with PT, but ultimately not willing to try any mobility.  ASL interpreter [name] L assisting t/o session.  Spent much of the session educating on HEP and how much stiffer he is now than last round of PT and that he will need to be able to let use do more and more (and do exercises on his own and be willing to deal with a little pain) if he is indeed going to be mobile again. Pt reports continued desire to work with PT, however his lack of tolerance for all but the basics today could pose an issue.  Will pick him up for a PT, pt knows he will have to be better about compliance not only with his own HEP but also with willingness to participate with mobility that, due to his severely stiffened LEs, may not be the most comfortable.      If plan is discharge home, recommend the following: Two people to help with walking and/or transfers;Two people to help with bathing/dressing/bathroom;Assistance with cooking/housework;Assistance with feeding;Direct supervision/assist for medications management;Direct supervision/assist for financial management;Assist for transportation;Help with stairs or ramp for entrance   Can travel by private vehicle   No    Equipment Recommendations Rolling walker (2 wheels);BSC/3in1;Wheelchair (measurements PT);Wheelchair cushion (measurements PT)  Recommendations for Other Services       Functional Status Assessment Patient has had a  recent decline in their functional status and demonstrates the ability to make significant improvements in function in a reasonable and predictable amount of time.     Precautions / Restrictions Precautions Precautions: Fall Restrictions Weight Bearing Restrictions Per Provider Order: Yes RLE Weight Bearing Per Provider Order: Weight bearing as tolerated LLE Weight Bearing Per Provider Order: Weight bearing as tolerated      Mobility  Bed Mobility Overal bed mobility: Needs Assistance             General bed mobility comments: Pt was unwilling to do any activities other than supine (made this very clear early on in the session) but did get up to long sitting with UE use on rails but did not appear to be too labored doing it    Transfers                   General transfer comment: refused    Ambulation/Gait                  Stairs            Wheelchair Mobility     Tilt Bed    Modified Rankin (Stroke Patients Only)       Balance Overall balance assessment: Needs assistance                                           Pertinent Vitals/Pain Pain Assessment Pain Assessment: 0-10 Faces Pain Scale: Hurts worst Pain Location: Pt tolerates very little palpation  of LEs at any point, especially resistant to any ROM at knees    Home Living Family/patient expects to be discharged to:: Unsure                   Additional Comments: Residential facility - difficult d/c planning situation    Prior Function Prior Level of Function : Independent/Modified Independent             Mobility Comments: Had been ambulating here with mobility specialist using RW or bilat axillary crutches. Began to report increased R knee swelling; has allowed minimal/no mobility since procedure. ADLs Comments: needs assistance here in hospital     Extremity/Trunk Assessment   Upper Extremity Assessment Upper Extremity Assessment: Generalized  weakness (Pt able to use UEs to sign, pull up on rails, etc - formal testing difficult)    Lower Extremity Assessment Lower Extremity Assessment: Generalized weakness (R knee with flexion to ~40*, literally no ROM available on L, extremely resistant to any attempts of even minimal gentle PROM) RLE Deficits / Details: very limited knee ROM, but able to lift LEs against gravity, ankle DF/PF with limited ROM LLE Deficits / Details: no knee ROM, but able to lift LEs against gravity at hips, ankle DF/PF with limited ROM       Communication   Communication Communication: Hearing impairment Cueing Techniques: Gestural cues  Cognition Arousal: Alert Behavior During Therapy: WFL for tasks assessed/performed Overall Cognitive Status: Within Functional Limits for tasks assessed                                 General Comments: via interpreter pt able to make needs known, however difficult to get him to understand that he will have to start being willing to do more and more with [place] to actually benefit from working with PT        General Comments General comments (skin integrity, edema, etc.): Pt ostensibly ready to start working with PT again, however he is now extremely stiff (b/l knees an ankles) and today was unwilling to do anyting but supine activity.  He was able to do some LE exercises but needed a lot of encouragement to do so with ligth resistance and/or facilitation from PT.    Exercises General Exercises - Lower Extremity Ankle Circles/Pumps: PROM, AAROM, 5 reps Quad Sets: 10 reps, AROM (popliteal pressure) Heel Slides: AAROM, 5 reps (unable on L (no ROM)) Hip ABduction/ADduction: Strengthening, 10 reps Straight Leg Raises: AROM, Both, 15 reps   Assessment/Plan    PT Assessment Patient needs continued PT services  PT Problem List Decreased activity tolerance;Decreased mobility;Pain;Decreased range of motion;Decreased safety awareness;Decreased strength;Decreased  cognition;Decreased knowledge of use of DME;Decreased knowledge of precautions       PT Treatment Interventions DME instruction;Gait training;Functional mobility training;Therapeutic activities;Therapeutic exercise;Balance training;Patient/family education    PT Goals (Current goals can be found in the Care Plan section)  Acute Rehab PT Goals Patient Stated Goal: try to walk PT Goal Formulation: With patient Time For Goal Achievement: 08/05/23 Potential to Achieve Goals: Fair    Frequency Min 1X/week     Co-evaluation               AM-PAC PT "6 Clicks" Mobility  Outcome Measure Help needed turning from your back to your side while in a flat bed without using bedrails?: A Little Help needed moving from lying on your back to sitting on the side of  a flat bed without using bedrails?: A Lot Help needed moving to and from a bed to a chair (including a wheelchair)?: Total Help needed standing up from a chair using your arms (e.g., wheelchair or bedside chair)?: Total Help needed to walk in hospital room?: Total Help needed climbing 3-5 steps with a railing? : Total 6 Click Score: 9    End of Session Equipment Utilized During Treatment: Gait belt Activity Tolerance: Patient limited by pain Patient left: in bed;with call bell/phone within reach;with bed alarm set Nurse Communication: Mobility status PT Visit Diagnosis: Other abnormalities of gait and mobility (R26.89);Muscle weakness (generalized) (M62.81);Difficulty in walking, not elsewhere classified (R26.2);Pain Pain - Right/Left: Right Pain - part of body: Knee    Time: 9528-4132 PT Time Calculation (min) (ACUTE ONLY): 40 min   Charges:   PT Evaluation $PT Re-evaluation: 1 Re-eval PT Treatments $Therapeutic Exercise: 8-22 mins PT General Charges $$ ACUTE PT VISIT: 1 Visit         Malachi Pro, DPT 09/21/2023, 12:53 PM
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
## 2023-09-22 NOTE — Plan of Care (Signed)
  Problem: Safety: Goal: Ability to remain free from injury will improve Outcome: Progressing   

## 2023-09-22 NOTE — Progress Notes (Signed)
Brief note.  Nonbillable note.  Patient asleep.  Would not wake up for the ASL interpreter.  Attempted to see patient, he waved his arms and requested that I leave.  I suspect some day night cycle reversal given his extremely extended hospital stay.  He has been willing to work with physical therapy.  He agreed to ostomy care from the [facility] RN.  No changes in medical management at this time.  Will continue to follow.  Lolita Patella MD  No charge

## 2023-09-22 NOTE — Progress Notes (Signed)
Refused most medications today. Eyedrops and miralax given. No output from colostomy today. Flatus noted. Ate 100% lunch.
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
## 2023-09-23 DIAGNOSIS — N451 Epididymitis: Secondary | ICD-10-CM | POA: Diagnosis not present

## 2023-09-23 NOTE — Plan of Care (Signed)
  Problem: Safety: Goal: Ability to remain free from injury will improve Outcome: Progressing   
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
## 2023-09-24 DIAGNOSIS — M009 Pyogenic arthritis, unspecified: Secondary | ICD-10-CM | POA: Diagnosis not present

## 2023-09-24 DIAGNOSIS — N451 Epididymitis: Secondary | ICD-10-CM | POA: Diagnosis not present

## 2023-09-24 DIAGNOSIS — F4320 Adjustment disorder, unspecified: Secondary | ICD-10-CM | POA: Diagnosis not present

## 2023-09-24 DIAGNOSIS — F4323 Adjustment disorder with mixed anxiety and depressed mood: Secondary | ICD-10-CM | POA: Diagnosis not present
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
## 2023-09-24 NOTE — Plan of Care (Signed)
  Problem: Safety: Goal: Ability to remain free from injury will improve Outcome: Progressing   

## 2023-09-25 DIAGNOSIS — M009 Pyogenic arthritis, unspecified: Secondary | ICD-10-CM | POA: Diagnosis not present

## 2023-09-25 DIAGNOSIS — N451 Epididymitis: Secondary | ICD-10-CM | POA: Diagnosis not present

## 2023-09-25 DIAGNOSIS — Z933 Colostomy status: Secondary | ICD-10-CM | POA: Diagnosis not present

## 2023-09-25 DIAGNOSIS — F4323 Adjustment disorder with mixed anxiety and depressed mood: Secondary | ICD-10-CM | POA: Diagnosis not present
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
## 2023-09-25 NOTE — Progress Notes (Signed)
Physical Therapy Treatment Patient Details Name: Martin [name] MRN: [id number] DOB: August 20, 1956 Today's Date: 09/25/2023   History of Present Illness Martin [name] is a 67yoM presenting to hospital 03/30/23 under IVC from residential facility due to behavioral concerns at facility; c/o chronic L knee pain.  PMH includes deafness, impaired vision, OA, hypothyroidism, L knee surgery, ostomy. Patient reporting increased L knee swelling chronic L knee pain. Status post R knee debridement and washout (06/24/23).    PT Comments  Author coordinates services with timing of interpreter [place]. Pt is agreeable to PT, but wants to do his leg ROM exercises. Author encourages pt to do independent exercises on his own and take full advantage of the expertise and skills of the rehab department in performing more complex and limited mobility tasks, however he is not interested in getting to EOB due to high ostomy pain levels. Pt asks for and received tylenol during session. Pt assisted with bed level leg exercises. Education on importance of OOB. Also educated on concept of gas pain and that gas pain medication exists and may be of benefit for him- this interested him. All needs met at end of session.    If plan is discharge home, recommend the following: Two people to help with walking and/or transfers;Two people to help with bathing/dressing/bathroom;Assistance with cooking/housework;Assistance with feeding;Direct supervision/assist for medications management;Direct supervision/assist for financial management;Assist for transportation;Help with stairs or ramp for entrance   Can travel by private vehicle     No  Equipment Recommendations  Rolling walker (2 wheels);BSC/3in1;Wheelchair (measurements PT);Wheelchair cushion (measurements PT)    Recommendations for Other Services       Precautions / Restrictions Precautions Precautions: Fall Precaution Comments: Legally blind, deaf, Ostomy     Mobility  Bed  Mobility Overal bed mobility:  (pt refuses OOB- cites ostomoy pain levels)                  Transfers                        Ambulation/Gait                   Stairs             Wheelchair Mobility     Tilt Bed    Modified Rankin (Stroke Patients Only)       Balance                                            Cognition                                                Exercises Other Exercises Other Exercises: AA/ROM SLR/march hybrid left; SLR Left 1x10 bilat Other Exercises: AA/ROM 1x10 bilat supine hip fADCT/ADD in 40 degrees of hip flexion Other Exercises: A/ROM SLR 1x15 bilat, cues to limit ROM to pain free range Other Exercises: RLE manually resisted SSLR extension 1x10, then 1x10 isometric extension into 2 pillows (did not get to LLE due to interuptions)    General Comments        Pertinent Vitals/Pain Pain Assessment Pain Assessment: 0-10 Pain Score: 8  (ostomy, ABD)    Home Living  Prior Function            PT Goals (current goals can now be found in the care plan section) Acute Rehab PT Goals PT Goal Formulation: Patient unable to participate in goal setting    Frequency    Min 1X/week      PT Plan Current plan remains appropriate    Co-evaluation              AM-PAC PT "6 Clicks" Mobility   Outcome Measure  Help needed turning from your back to your side while in a flat bed without using bedrails?: A Lot Help needed moving from lying on your back to sitting on the side of a flat bed without using bedrails?: Total Help needed moving to and from a bed to a chair (including a wheelchair)?: Total Help needed standing up from a chair using your arms (e.g., wheelchair or bedside chair)?: Total Help needed to walk in hospital room?: Total   6 Click Score: 6    End of Session   Activity Tolerance: Patient tolerated treatment  well Patient left: in bed;with call bell/phone within reach;with family/visitor present Nurse Communication: Mobility status PT Visit Diagnosis: Other abnormalities of gait and mobility (R26.89);Muscle weakness (generalized) (M62.81);Difficulty in walking, not elsewhere classified (R26.2)     Time: 1353-1430 PT Time Calculation (min) (ACUTE ONLY): 37 min  Charges:    $Therapeutic Activity: 23-37 mins PT General Charges $$ ACUTE PT VISIT: 1 Visit                    4:04 PM, 09/25/23 Rosamaria Lints, PT, DPT Physical Therapist - [facility]  Outpatient Physical Therapy- Main Campus [phone]      Martin [name] C 09/25/2023, 4:01 PM

## 2023-09-25 NOTE — TOC Progression Note (Addendum)
Transition of Care [facility]) - Progression Note    Patient Details  Name: Martin [name] MRN: [id number] Date of Birth: [birth year]  Transition of Care [facility]) CM/SW Contact  Marlowe Sax, RN Phone Number: 09/25/2023, 12:11 PM  Clinical Narrative:    DSS continues to seek legal guardian ship and is awaiting a court hearing, no bed offers for placement at this time [facility] DSS at phone: [phone]. Financial department Called to obtain pt's Caseworker name and contact information. Confirmed [facility] [place] ph# [phone].    Expected Discharge Plan:  (TBD) Barriers to Discharge: Continued Medical Work up  Expected Discharge Plan and Services                                               Social Determinants of Health (SDOH) Interventions SDOH Screenings   Food Insecurity: Patient Declined (08/29/2023)  Housing: Patient Declined (08/29/2023)  Transportation Needs: Patient Declined (08/29/2023)  Utilities: Patient Declined (08/29/2023)  Tobacco Use: Low Risk  (04/29/2023)    Readmission Risk Interventions     No data to display

## 2023-09-25 NOTE — Progress Notes (Signed)
RN attempted to change patient's linens, shirt and mesh brief, communicated by writing it down, patient refused and told RN to "go!".

## 2023-09-25 NOTE — Progress Notes (Signed)
Patient is requesting food that is not available after 1930. Explained to patient via written communication and TTY. Patient was given chicken broth and crackers.

## 2023-09-25 NOTE — Plan of Care (Signed)
  Problem: Safety: Goal: Ability to remain free from injury will improve Outcome: Progressing   

## 2023-09-26 ENCOUNTER — Inpatient Hospital Stay: Payer: Medicare Other

## 2023-09-26 DIAGNOSIS — G252 Other specified forms of tremor: Secondary | ICD-10-CM

## 2023-09-26 DIAGNOSIS — M009 Pyogenic arthritis, unspecified: Secondary | ICD-10-CM | POA: Diagnosis not present

## 2023-09-26 DIAGNOSIS — N451 Epididymitis: Secondary | ICD-10-CM | POA: Diagnosis not present

## 2023-09-26 DIAGNOSIS — F4320 Adjustment disorder, unspecified: Secondary | ICD-10-CM | POA: Diagnosis not present

## 2023-09-26 LAB — CBC
HCT: 32.5 % — ABNORMAL LOW (ref 39.0–52.0)
Hemoglobin: 10.7 g/dL — ABNORMAL LOW (ref 13.0–17.0)
MCH: 29.4 pg (ref 26.0–34.0)
MCHC: 32.9 g/dL (ref 30.0–36.0)
MCV: 89.3 fL (ref 80.0–100.0)
Platelets: 155 10*3/uL (ref 150–400)
RBC: 3.64 MIL/uL — ABNORMAL LOW (ref 4.22–5.81)
RDW: 14.2 % (ref 11.5–15.5)
WBC: 6.8 10*3/uL (ref 4.0–10.5)
nRBC: 0 % (ref 0.0–0.2)

## 2023-09-26 LAB — BASIC METABOLIC PANEL
Anion gap: 8 (ref 5–15)
BUN: 24 mg/dL — ABNORMAL HIGH (ref 8–23)
CO2: 24 mmol/L (ref 22–32)
Calcium: 9.1 mg/dL (ref 8.9–10.3)
Chloride: 100 mmol/L (ref 98–111)
Creatinine, Ser: 0.88 mg/dL (ref 0.61–1.24)
GFR, Estimated: >60 mL/min (ref >60)
Glucose, Bld: 93 mg/dL (ref 70–99)
Potassium: 4.5 mmol/L (ref 3.5–5.1)
Sodium: 132 mmol/L — ABNORMAL LOW (ref 135–145)

## 2023-09-26 LAB — MAGNESIUM: Magnesium: 2 mg/dL (ref 1.7–2.4)

## 2023-09-26 LAB — GLUCOSE, CAPILLARY
Glucose-Capillary: 89 mg/dL (ref 70–99)
Glucose-Capillary: 83 mg/dL (ref 70–99)
Glucose-Capillary: 102 mg/dL — ABNORMAL HIGH (ref 70–99)

## 2023-09-26 MED ORDER — OXYCODONE HCL 5 MG PO TABS
5.0000 mg | ORAL_TABLET | Freq: Four times a day (QID) | ORAL | Status: DC | PRN
  Administered 2023-10-23 – 2023-11-20 (×14): 5 mg via ORAL
  Filled 2023-09-26 (×16): qty 1

## 2023-09-26 MED ORDER — OXYCODONE HCL 5 MG PO TABS
5.0000 mg | ORAL_TABLET | Freq: Once | ORAL | Status: AC
  Administered 2023-09-26: 5 mg via ORAL
  Filled 2023-09-26: qty 1

## 2023-09-26 MED ORDER — SENNOSIDES-DOCUSATE SODIUM 8.6-50 MG PO TABS
2.0000 | ORAL_TABLET | Freq: Two times a day (BID) | ORAL | Status: DC
  Administered 2023-09-26 – 2023-11-10 (×5): 2 via ORAL
  Filled 2023-09-26 (×23): qty 2

## 2023-09-26 MED ORDER — LORAZEPAM 2 MG/ML IJ SOLN: 0.5000 mg | INTRAMUSCULAR | Status: DC | PRN

## 2023-09-26 MED ORDER — MAGNESIUM HYDROXIDE 400 MG/5ML PO SUSP
30.0000 mL | Freq: Once | ORAL | Status: AC
  Administered 2023-09-26: 30 mL via ORAL
  Filled 2023-09-26: qty 30

## 2023-09-26 NOTE — Significant Event (Signed)
Rapid Response Event Note   Reason for Call :  Seizure   Initial Focused Assessment:  Patient shaking his arms in air but able to move extremity's willingly.  Vital signs stable on arrival BP 110/85 HR 75 O2 93 on RA. NP Jawo at bedside.  Patient yellling at staff during lab draw and communicated shortly after rapid his colostomy was hurting     Interventions:  -Labs -Pain Medication.  1610 Primary nurse Enrique Sack reached in regards to patients response to pain medication. Primary nurse informed rapid nurse that patients shaking of arms had gotten better   MD Notified: Jawo Call Time: 0240 Arrival Time:0241 End Time:0300  Judyann Munson, RN
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
## 2023-09-26 NOTE — Progress Notes (Signed)
A rapid response was called due to pt appearing to have seizure like activity. Provider on call came to bedside to assess the pt. Labs collected and orders for oxycodone for pain placed. After medication administration pt returned to baseline. Will continue to monitor.

## 2023-09-26 NOTE — Plan of Care (Signed)
This morning RN noticed patient had tremors that he had not previously had, CBG checked and read 83, given 4 oz orange juice, CBG came up to 102.  Pt c/o pain in his LLQ where his colostomy is. Given miralax, tylenol, and sennakot was added to his medication regimen. ASL interpreter states that the patient's signing seems to be slower today and he is less interactive.  MD ordered brain MRI to r/o any underlying pathology of tremors and seizure RRT that was called last night.  Pt refused vital signs until this evening. Vitals stable.   Problem: Safety: Goal: Ability to remain free from injury will improve Outcome: Progressing

## 2023-09-26 NOTE — Plan of Care (Signed)
  Problem: Safety: Goal: Ability to remain free from injury will improve Outcome: Progressing   

## 2023-09-26 NOTE — TOC Progression Note (Signed)
Transition of Care [facility]) - Progression Note    Patient Details  Name: Martin [name] MRN: [id number] Date of Birth: [birth year]  Transition of [facility] CM/SW Contact  Marlowe Sax, RN Phone Number: 09/26/2023, 3:31 PM  Clinical Narrative:     Interpreter services [name]) notified me that he was here to gain consent for an MRI, I explained that Psych deemed him to not have the capacity to make medical decisions and I questioned if he would be allowed to consent to any medical procedure, [name] repeated that he was called to get consent from the patient for the MRI and that he was here at the hospital to do so, I reached out to the physician and notified him of this, Physician advisor and Attending both agree, [name] the interpreter explained the MRI to the patient and he is calm and going for the MRI willingly  Expected Discharge Plan:  (TBD) Barriers to Discharge: Continued Medical Work up  Expected Discharge Plan and Services                                               Social Determinants of Health (SDOH) Interventions SDOH Screenings   Food Insecurity: Patient Declined (08/29/2023)  Housing: Patient Declined (08/29/2023)  Transportation Needs: Patient Declined (08/29/2023)  Utilities: Patient Declined (08/29/2023)  Tobacco Use: Low Risk  (04/29/2023)    Readmission Risk Interventions     No data to display

## 2023-09-26 NOTE — Progress Notes (Signed)
       CROSS COVER NOTE  NAME: Martin [name] MRN: [id number] DOB : [birth year]    Date of Service   09/26/2023   HPI/Events of Note   Nursing called a rapid response on this patient with concerns of a possible seizure.  This NP came to bedside to assess patient, patient did not appear to be having an active seizure at this time.  Patient not interacting as usual with staff.  Patient refusing to write on the paper as he usually did to communicate.  Unable to ascertain due to patient's unwillingness to participate.  Patient however moving all extremities willingly.  Interventions   -Hematology and chemistry labs sent to rule out any acute electrolyte imbalance. -Results unremarkable. -Patient still complained of pain, patient given some oxycodone. -Patient returned to baseline.       [name], MSN, APRN, AGACNP-BC [facility]  Pager: [phone]. Check Amion for Availability

## 2023-09-27 DIAGNOSIS — Z933 Colostomy status: Secondary | ICD-10-CM | POA: Diagnosis not present

## 2023-09-27 DIAGNOSIS — F4323 Adjustment disorder with mixed anxiety and depressed mood: Secondary | ICD-10-CM | POA: Diagnosis not present

## 2023-09-27 DIAGNOSIS — N451 Epididymitis: Secondary | ICD-10-CM | POA: Diagnosis not present

## 2023-09-27 DIAGNOSIS — M009 Pyogenic arthritis, unspecified: Secondary | ICD-10-CM | POA: Diagnosis not present

## 2023-09-27 MED ORDER — MAGNESIUM HYDROXIDE 400 MG/5ML PO SUSP
30.0000 mL | Freq: Every day | ORAL | Status: DC | PRN
  Administered 2023-09-29 – 2024-02-06 (×51): 30 mL via ORAL
  Filled 2023-09-27 (×60): qty 30

## 2023-09-27 MED ORDER — MAGNESIUM HYDROXIDE 400 MG/5ML PO SUSP
30.0000 mL | Freq: Every day | ORAL | Status: DC
  Administered 2023-09-27 – 2023-09-29 (×3): 30 mL via ORAL
  Filled 2023-09-27 (×6): qty 30
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
## 2023-09-27 NOTE — Care Management Important Message (Signed)
Important Message  Patient Details  Name: Martin [name] MRN: [id number] Date of Birth: 09/10/1956   Important Message Given:  Yes - Medicare IM     Bernadette Hoit 09/27/2023, 2:31 PM

## 2023-09-27 NOTE — Plan of Care (Signed)
  Problem: Safety: Goal: Ability to remain free from injury will improve Outcome: Progressing   

## 2023-09-28 DIAGNOSIS — N451 Epididymitis: Secondary | ICD-10-CM | POA: Diagnosis not present

## 2023-09-28 DIAGNOSIS — M009 Pyogenic arthritis, unspecified: Secondary | ICD-10-CM | POA: Diagnosis not present

## 2023-09-28 DIAGNOSIS — H548 Legal blindness, as defined in USA: Secondary | ICD-10-CM | POA: Diagnosis not present

## 2023-09-28 DIAGNOSIS — K5981 Ogilvie syndrome: Secondary | ICD-10-CM | POA: Diagnosis not present
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
## 2023-09-28 NOTE — Progress Notes (Signed)
PT Cancellation Note  Patient Details Name: Martin [name] MRN: [id number] DOB: [birth year]   Cancelled Treatment:    Reason Eval/Treat Not Completed: Other (comment) (Per interpreter- pt not interested in PT session today. Will defer to later date/time.)   Madilyn Cephas C 09/28/2023, 4:33 PM

## 2023-09-28 NOTE — Plan of Care (Signed)
  Problem: Safety: Goal: Ability to remain free from injury will improve Outcome: Progressing   Problem: Safety: Goal: Ability to remain free from injury will improve 09/28/2023 0001 by Eulis Manly, RN Outcome: Progressing 09/28/2023 0000 by Eulis Manly, RN Outcome: Progressing

## 2023-09-28 NOTE — Plan of Care (Signed)
  Problem: Safety: Goal: Ability to remain free from injury will improve Outcome: Progressing   
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
## 2023-09-29 NOTE — Plan of Care (Signed)
  Problem: Safety: Goal: Ability to remain free from injury will improve Outcome: Progressing   

## 2023-09-30 DIAGNOSIS — M009 Pyogenic arthritis, unspecified: Secondary | ICD-10-CM | POA: Diagnosis not present

## 2023-09-30 DIAGNOSIS — N451 Epididymitis: Secondary | ICD-10-CM | POA: Diagnosis not present

## 2023-09-30 DIAGNOSIS — F4323 Adjustment disorder with mixed anxiety and depressed mood: Secondary | ICD-10-CM | POA: Diagnosis not present

## 2023-09-30 NOTE — Plan of Care (Signed)
  Problem: Safety: Goal: Ability to remain free from injury will improve Outcome: Not Progressing   
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
## 2023-09-30 NOTE — Plan of Care (Signed)
  Problem: Safety: Goal: Ability to remain free from injury will improve Outcome: Adequate for Discharge

## 2023-10-01 DIAGNOSIS — N451 Epididymitis: Secondary | ICD-10-CM | POA: Diagnosis not present

## 2023-10-01 DIAGNOSIS — H9193 Unspecified hearing loss, bilateral: Secondary | ICD-10-CM | POA: Diagnosis not present

## 2023-10-01 DIAGNOSIS — F4323 Adjustment disorder with mixed anxiety and depressed mood: Secondary | ICD-10-CM | POA: Diagnosis not present

## 2023-10-01 NOTE — Progress Notes (Signed)
Patient is more frustrated today. Patient communicated to staff this morning that he wanted "bath now". This RN used written communication board to tell patient we would be in in 10 minutes to do bath. After 5 minutes the NT was available to help, so we presented patient with warm washcloth to which he shook his head and refused washcloth and covered his face with his hands. This RN attempted written communication again offering bath. Patient kept his face covered and would not communicate.   Just now, this RN was notified that patient called 911. This RN and Rod, Consulting civil engineer entered room and used communication board to attempt to communicate with patient. This RN approached and lightly touched patient on his right shoulder to alert him to my presence. Patient seemed startled then covered his face with his hands. This RN presented communication board to patient offering assistance but patient refused to acknowledge communication board. This Water engineer exited room.   Will attempt to call ASL interpreter at this time for additional support in order to meet patient needs.
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
## 2023-10-01 NOTE — Progress Notes (Signed)
Pt started acting up by calling an interpreter through his phone

## 2023-10-01 NOTE — Progress Notes (Signed)
ASL interpreter on unit from 1400-1500. Interpreter attempted to communicate with patient multiple times during the hour. Patient appeared asleep and did not respond to interpreter's multiple attempts at awaking him (touching patient's right shoulder). Patient kept his eyes closed during each communication attempt and did not respond.

## 2023-10-02 DIAGNOSIS — F4323 Adjustment disorder with mixed anxiety and depressed mood: Secondary | ICD-10-CM | POA: Diagnosis not present

## 2023-10-02 DIAGNOSIS — N451 Epididymitis: Secondary | ICD-10-CM | POA: Diagnosis not present

## 2023-10-02 DIAGNOSIS — F4320 Adjustment disorder, unspecified: Secondary | ICD-10-CM | POA: Diagnosis not present

## 2023-10-02 DIAGNOSIS — E039 Hypothyroidism, unspecified: Secondary | ICD-10-CM | POA: Diagnosis not present

## 2023-10-02 NOTE — Progress Notes (Signed)
Patient refusing meds and all patient care this shift.
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
## 2023-10-02 NOTE — Progress Notes (Signed)
PT Cancellation Note  Patient Details Name: Martin [name] MRN: [id number] DOB: 02-Aug-1956   Cancelled Treatment:    Reason Eval/Treat Not Completed: Fatigue/lethargy limiting ability to participate (Treatment session attempted. Per interpreter, patient sleeping soundly and unwilling to arouse for participation with session.  Will continue efforts as appropriate.)  Saria Haran H. Manson Passey, PT, DPT, NCS 10/02/23, 4:34 PM [phone]

## 2023-10-02 NOTE — Progress Notes (Signed)
Patient has refused care throughout my shift. Interpreter [name] was present and patient asked him to leave. Patient also refused medication and this nurses presence in his room.

## 2023-10-02 NOTE — Care Management Important Message (Signed)
Important Message  Patient Details  Name: Nayel Warncke MRN: [id number] Date of Birth: 1956-01-17   Important Message Given:  Yes - Medicare IM     Bernadette Hoit 10/02/2023, 11:38 AM

## 2023-10-03 DIAGNOSIS — Z933 Colostomy status: Secondary | ICD-10-CM | POA: Diagnosis not present

## 2023-10-03 DIAGNOSIS — N451 Epididymitis: Secondary | ICD-10-CM | POA: Diagnosis not present

## 2023-10-03 DIAGNOSIS — M009 Pyogenic arthritis, unspecified: Secondary | ICD-10-CM | POA: Diagnosis not present

## 2023-10-03 DIAGNOSIS — F4320 Adjustment disorder, unspecified: Secondary | ICD-10-CM | POA: Diagnosis not present
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
## 2023-10-03 NOTE — Plan of Care (Signed)
  Problem: Safety: Goal: Ability to remain free from injury will improve Outcome: Progressing   

## 2023-10-04 DIAGNOSIS — N451 Epididymitis: Secondary | ICD-10-CM | POA: Diagnosis not present

## 2023-10-04 DIAGNOSIS — M009 Pyogenic arthritis, unspecified: Secondary | ICD-10-CM | POA: Diagnosis not present

## 2023-10-04 DIAGNOSIS — Z933 Colostomy status: Secondary | ICD-10-CM | POA: Diagnosis not present

## 2023-10-04 DIAGNOSIS — F4320 Adjustment disorder, unspecified: Secondary | ICD-10-CM | POA: Diagnosis not present

## 2023-10-04 NOTE — Plan of Care (Signed)
  Problem: Safety: Goal: Ability to remain free from injury will improve Outcome: Progressing   
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
## 2023-10-05 DIAGNOSIS — F4320 Adjustment disorder, unspecified: Secondary | ICD-10-CM | POA: Diagnosis not present

## 2023-10-05 DIAGNOSIS — M009 Pyogenic arthritis, unspecified: Secondary | ICD-10-CM | POA: Diagnosis not present

## 2023-10-05 DIAGNOSIS — Z933 Colostomy status: Secondary | ICD-10-CM | POA: Diagnosis not present

## 2023-10-05 DIAGNOSIS — N451 Epididymitis: Secondary | ICD-10-CM | POA: Diagnosis not present
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
## 2023-10-05 NOTE — Plan of Care (Signed)
  Problem: Safety: Goal: Ability to remain free from injury will improve Outcome: Progressing   

## 2023-10-05 NOTE — Progress Notes (Signed)
PT Cancellation Note  Patient Details Name: Martin [name] MRN: [id number] DOB: 1956/07/24   Cancelled Treatment:     PT attempt. Pt laying with blankets over head upon arrival. He uncovered head and then refused PT requesting Thereasa Parkin return next time interpreter is present. He quickly covers head back up and was unwilling to participate.    Rushie Chestnut 10/05/2023, 2:37 PM

## 2023-10-06 DIAGNOSIS — M009 Pyogenic arthritis, unspecified: Secondary | ICD-10-CM | POA: Diagnosis not present

## 2023-10-06 DIAGNOSIS — F4320 Adjustment disorder, unspecified: Secondary | ICD-10-CM | POA: Diagnosis not present

## 2023-10-06 DIAGNOSIS — N451 Epididymitis: Secondary | ICD-10-CM | POA: Diagnosis not present

## 2023-10-06 DIAGNOSIS — Z933 Colostomy status: Secondary | ICD-10-CM | POA: Diagnosis not present
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
## 2023-10-06 NOTE — Progress Notes (Signed)
PT Cancellation Note  Patient Details Name: Naftali Bielenberg MRN: [id number] DOB: 1956-08-13   Cancelled Treatment:    Reason Eval/Treat Not Completed: Patient declined, no reason specified (Treatment session coordinated with interpreter. Patient sleeping with blanket over head upon arrival to room.  Does awaken to light touch on R shoulder (max effort) and allows interpreter to introduce PT.  Patient promptly waves off therapist and pulls covers back over head.  Attempted second time with similar response, then patient refusing to interact further.)  Will continue efforts additional 1-2x; however, refusals persist, will discontinue order due to lack of participation with POC.   Lativia Velie H. Manson Passey, PT, DPT, NCS 10/06/23, 2:57 PM [phone]

## 2023-10-06 NOTE — Progress Notes (Addendum)
Pt wrote on his white board saying he needs his sheets changed. When the NT and I attempted to do so he yelled and slapped the white board out of the NT hand and slapped her arm. We tried ask what exactly he needed and he just became frustrated and wouldn't let us help him.   Pt ended up changing the pad himself and proceeded to throw everything else he was changing.

## 2023-10-07 DIAGNOSIS — F4323 Adjustment disorder with mixed anxiety and depressed mood: Secondary | ICD-10-CM | POA: Diagnosis not present

## 2023-10-07 DIAGNOSIS — E039 Hypothyroidism, unspecified: Secondary | ICD-10-CM | POA: Diagnosis not present

## 2023-10-07 DIAGNOSIS — N451 Epididymitis: Secondary | ICD-10-CM | POA: Diagnosis not present

## 2023-10-07 DIAGNOSIS — F4320 Adjustment disorder, unspecified: Secondary | ICD-10-CM | POA: Diagnosis not present

## 2023-10-07 NOTE — Plan of Care (Signed)
  Problem: Safety: Goal: Ability to remain free from injury will improve Outcome: Progressing   
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
## 2023-10-08 DIAGNOSIS — N451 Epididymitis: Secondary | ICD-10-CM | POA: Diagnosis not present

## 2023-10-08 DIAGNOSIS — M009 Pyogenic arthritis, unspecified: Secondary | ICD-10-CM | POA: Diagnosis not present

## 2023-10-08 DIAGNOSIS — Z933 Colostomy status: Secondary | ICD-10-CM | POA: Diagnosis not present

## 2023-10-08 DIAGNOSIS — F4320 Adjustment disorder, unspecified: Secondary | ICD-10-CM | POA: Diagnosis not present
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
## 2023-10-09 DIAGNOSIS — N451 Epididymitis: Secondary | ICD-10-CM | POA: Diagnosis not present

## 2023-10-09 DIAGNOSIS — M009 Pyogenic arthritis, unspecified: Secondary | ICD-10-CM | POA: Diagnosis not present

## 2023-10-09 DIAGNOSIS — F4320 Adjustment disorder, unspecified: Secondary | ICD-10-CM | POA: Diagnosis not present

## 2023-10-09 DIAGNOSIS — Z933 Colostomy status: Secondary | ICD-10-CM | POA: Diagnosis not present

## 2023-10-09 NOTE — TOC Progression Note (Signed)
Transition of Care [facility]) - Progression Note    Patient Details  Name: Martin [name] MRN: [id number] Date of Birth: Feb 03, 1956  [facility]) CM/SW Contact  Marlowe Sax, RN Phone Number: 10/09/2023, 11:16 AM  Clinical Narrative:     Reached out to Aniya at DSS asking for update on legal guardianship hearing, awaiting a response  Expected Discharge Plan:  (TBD) Barriers to Discharge: Continued Medical Work up  Expected Discharge Plan and Services                                               Social Determinants of Health (SDOH) Interventions SDOH Screenings   Food Insecurity: Patient Declined (08/29/2023)  Housing: Patient Declined (08/29/2023)  Transportation Needs: Patient Declined (08/29/2023)  Utilities: Patient Declined (08/29/2023)  Tobacco Use: Low Risk  (04/29/2023)    Readmission Risk Interventions     No data to display
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
## 2023-10-09 NOTE — Plan of Care (Signed)
  Problem: Safety: Goal: Ability to remain free from injury will improve Outcome: Progressing   

## 2023-10-10 DIAGNOSIS — Z933 Colostomy status: Secondary | ICD-10-CM | POA: Diagnosis not present

## 2023-10-10 DIAGNOSIS — F4320 Adjustment disorder, unspecified: Secondary | ICD-10-CM | POA: Diagnosis not present

## 2023-10-10 DIAGNOSIS — N451 Epididymitis: Secondary | ICD-10-CM | POA: Diagnosis not present

## 2023-10-10 DIAGNOSIS — M009 Pyogenic arthritis, unspecified: Secondary | ICD-10-CM | POA: Diagnosis not present

## 2023-10-10 NOTE — Plan of Care (Signed)
   Problem: Safety: Goal: Ability to remain free from injury will improve Outcome: Progressing

## 2023-10-10 NOTE — Progress Notes (Signed)
 PT Cancellation Note  Patient Details Name: Martin [name] MRN: [id number] DOB: 03-12-1956   Cancelled Treatment:     Attempt made to see pt for PT with [name] (Interpreter). Pt reluctant to open eyes and acknowledge clinical research associate or interpreter. Once willing to open eyes, pt refused any activity. Pt notified that pt would possibly be discharged from PT services due to poor participation, pt acknowledged. Pt was Re-evaled by PT on 12/12, participated in minimal LE ROM exercises in bed on 12/16. After two weeks and 5 attempts to see pt with interpreter, pt has been non-compliant. Will discuss with primary PT regarding discontinuation of services due to poor participation. MD, Nursing, and CM notified.   [name] 10/10/2023, 2:52 PM
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
## 2023-10-11 DIAGNOSIS — N451 Epididymitis: Secondary | ICD-10-CM | POA: Diagnosis not present

## 2023-10-11 DIAGNOSIS — H9193 Unspecified hearing loss, bilateral: Secondary | ICD-10-CM | POA: Diagnosis not present

## 2023-10-11 DIAGNOSIS — H548 Legal blindness, as defined in USA: Secondary | ICD-10-CM | POA: Diagnosis not present
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
## 2023-10-12 DIAGNOSIS — N451 Epididymitis: Secondary | ICD-10-CM | POA: Diagnosis not present

## 2023-10-12 DIAGNOSIS — H548 Legal blindness, as defined in USA: Secondary | ICD-10-CM | POA: Diagnosis not present

## 2023-10-12 DIAGNOSIS — H9193 Unspecified hearing loss, bilateral: Secondary | ICD-10-CM | POA: Diagnosis not present
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
## 2023-10-13 DIAGNOSIS — H548 Legal blindness, as defined in USA: Secondary | ICD-10-CM | POA: Diagnosis not present

## 2023-10-13 DIAGNOSIS — H9193 Unspecified hearing loss, bilateral: Secondary | ICD-10-CM | POA: Diagnosis not present

## 2023-10-13 DIAGNOSIS — N451 Epididymitis: Secondary | ICD-10-CM | POA: Diagnosis not present
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
## 2023-10-13 NOTE — Progress Notes (Signed)
 PT Cancellation Note  Patient Details Name: Martin [name] MRN: [id number] DOB: 1956-09-23   Cancelled Treatment:    Reason Eval/Treat Not Completed: Patient declined, no reason specified (PT treatment session attempted; interpreter [place]) present). Upon introduction, patient immediately responding no to participation with therapy services.  Offered variety of activities, offered patient to lead/direct goals of session; continued to voice no.  Denies pain and offers no additional insight into continuous refusals.  Continues to answer no and then states I'm not talking anymore.)  Due to persistent refusals (6 consecutive) and very limited participation/general non-compliance with second course of skilled PT services, will discontinue current order.  Care team informed/aware.    Jullisa Grigoryan H. Delores, PT, DPT, NCS 10/13/23, 10:58 AM [phone]

## 2023-10-13 NOTE — Plan of Care (Signed)
   Problem: Safety: Goal: Ability to remain free from injury will improve Outcome: Progressing

## 2023-10-14 DIAGNOSIS — H548 Legal blindness, as defined in USA: Secondary | ICD-10-CM | POA: Diagnosis not present

## 2023-10-14 DIAGNOSIS — N451 Epididymitis: Secondary | ICD-10-CM | POA: Diagnosis not present

## 2023-10-14 DIAGNOSIS — H9193 Unspecified hearing loss, bilateral: Secondary | ICD-10-CM | POA: Diagnosis not present

## 2023-10-14 NOTE — Plan of Care (Signed)
   Problem: Safety: Goal: Ability to remain free from injury will improve Outcome: Progressing
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
## 2023-10-15 DIAGNOSIS — H548 Legal blindness, as defined in USA: Secondary | ICD-10-CM | POA: Diagnosis not present

## 2023-10-15 DIAGNOSIS — H9193 Unspecified hearing loss, bilateral: Secondary | ICD-10-CM | POA: Diagnosis not present

## 2023-10-15 DIAGNOSIS — N451 Epididymitis: Secondary | ICD-10-CM | POA: Diagnosis not present

## 2023-10-15 NOTE — Plan of Care (Signed)
   Problem: Safety: Goal: Ability to remain free from injury will improve Outcome: Progressing
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
## 2023-10-16 DIAGNOSIS — N451 Epididymitis: Secondary | ICD-10-CM | POA: Diagnosis not present

## 2023-10-16 DIAGNOSIS — H548 Legal blindness, as defined in USA: Secondary | ICD-10-CM | POA: Diagnosis not present

## 2023-10-16 DIAGNOSIS — H9193 Unspecified hearing loss, bilateral: Secondary | ICD-10-CM | POA: Diagnosis not present

## 2023-10-16 NOTE — TOC Progression Note (Signed)
 Transition of Care [facility]) - Progression Note    Patient Details  Name: Martin [name] MRN: [id number] Date of Birth: Sep 08, 1956  Transition of Care [facility]) CM/SW Contact  Tomasa JAYSON Childes, RN Phone Number: 10/16/2023, 3:51 PM  Clinical Narrative:    Attempt to contact Kayla @ [facility] DSS for update on hearing at [phone]. No answer. Left a message.   Expected Discharge Plan:  (TBD) Barriers to Discharge: Continued Medical Work up  Expected Discharge Plan and Services                                               Social Determinants of Health (SDOH) Interventions SDOH Screenings   Food Insecurity: Patient Declined (08/29/2023)  Housing: Patient Declined (08/29/2023)  Transportation Needs: Patient Declined (08/29/2023)  Utilities: Patient Declined (08/29/2023)  Social Connections: Unknown (10/10/2023)  Tobacco Use: Low Risk  (04/29/2023)    Readmission Risk Interventions     No data to display
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
## 2023-10-16 NOTE — Plan of Care (Signed)
   Problem: Safety: Goal: Ability to remain free from injury will improve Outcome: Progressing
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
## 2023-10-17 DIAGNOSIS — N451 Epididymitis: Secondary | ICD-10-CM | POA: Diagnosis not present

## 2023-10-17 DIAGNOSIS — H548 Legal blindness, as defined in USA: Secondary | ICD-10-CM | POA: Diagnosis not present

## 2023-10-17 DIAGNOSIS — H9193 Unspecified hearing loss, bilateral: Secondary | ICD-10-CM | POA: Diagnosis not present
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
## 2023-10-17 NOTE — Plan of Care (Signed)
   Problem: Safety: Goal: Ability to remain free from injury will improve Outcome: Progressing
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
## 2023-10-18 DIAGNOSIS — F4320 Adjustment disorder, unspecified: Secondary | ICD-10-CM | POA: Diagnosis not present

## 2023-10-18 DIAGNOSIS — H548 Legal blindness, as defined in USA: Secondary | ICD-10-CM | POA: Diagnosis not present

## 2023-10-18 DIAGNOSIS — N451 Epididymitis: Secondary | ICD-10-CM | POA: Diagnosis not present

## 2023-10-18 NOTE — Plan of Care (Signed)
   Problem: Safety: Goal: Ability to remain free from injury will improve Outcome: Progressing
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
## 2023-10-18 NOTE — TOC Progression Note (Addendum)
 Transition of Care [facility]) - Progression Note    Patient Details  Name: Martin [name] MRN: [id number] Date of Birth: 28-Sep-1956  Transition of Care [facility]) CM/SW Contact  Royanne JINNY Bernheim, RN Phone Number: 10/18/2023, 1:00 PM  Clinical Narrative:    Sent secure email to [facility] at DSS asking for update   Update, Received an update from Elease Molly with DSS  She stated that she has completed the paperwork for legal guardianship and is awaiting corrections she is seeking a court date and she will keep me posted, she is coming to the hospital soon to see the patient  Elease Molly Adult Management Consultant Social Worker III Office: [phone] Cell:[phone] Fax: [phone]  Expected Discharge Plan:  (TBD) Barriers to Discharge: Continued Medical Work up  Expected Discharge Plan and Services                                               Social Determinants of Health (SDOH) Interventions SDOH Screenings   Food Insecurity: Patient Declined (08/29/2023)  Housing: Patient Declined (08/29/2023)  Transportation Needs: Patient Declined (08/29/2023)  Utilities: Patient Declined (08/29/2023)  Social Connections: Unknown (10/10/2023)  Tobacco Use: Low Risk  (04/29/2023)    Readmission Risk Interventions     No data to display

## 2023-10-19 DIAGNOSIS — F4323 Adjustment disorder with mixed anxiety and depressed mood: Secondary | ICD-10-CM | POA: Diagnosis not present

## 2023-10-19 DIAGNOSIS — N451 Epididymitis: Secondary | ICD-10-CM | POA: Diagnosis not present

## 2023-10-19 DIAGNOSIS — H548 Legal blindness, as defined in USA: Secondary | ICD-10-CM | POA: Diagnosis not present

## 2023-10-19 MED ORDER — POLYETHYLENE GLYCOL 3350 17 G PO PACK
17.0000 g | PACK | Freq: Every day | ORAL | Status: DC | PRN
  Administered 2024-02-07: 17 g via ORAL
  Filled 2023-10-19 (×2): qty 1
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
## 2023-10-19 NOTE — Plan of Care (Signed)
   Problem: Safety: Goal: Ability to remain free from injury will improve Outcome: Progressing

## 2023-10-20 DIAGNOSIS — H9193 Unspecified hearing loss, bilateral: Secondary | ICD-10-CM | POA: Diagnosis not present

## 2023-10-20 DIAGNOSIS — F4323 Adjustment disorder with mixed anxiety and depressed mood: Secondary | ICD-10-CM | POA: Diagnosis not present

## 2023-10-20 DIAGNOSIS — H548 Legal blindness, as defined in USA: Secondary | ICD-10-CM | POA: Diagnosis not present

## 2023-10-20 NOTE — Plan of Care (Signed)
   Problem: Safety: Goal: Ability to remain free from injury will improve Outcome: Progressing
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
## 2023-10-21 DIAGNOSIS — N451 Epididymitis: Secondary | ICD-10-CM | POA: Diagnosis not present

## 2023-10-21 DIAGNOSIS — H9193 Unspecified hearing loss, bilateral: Secondary | ICD-10-CM | POA: Diagnosis not present

## 2023-10-21 DIAGNOSIS — F4323 Adjustment disorder with mixed anxiety and depressed mood: Secondary | ICD-10-CM | POA: Diagnosis not present

## 2023-10-21 NOTE — Plan of Care (Signed)
   Problem: Safety: Goal: Ability to remain free from injury will improve Outcome: Progressing

## 2023-10-21 NOTE — Progress Notes (Signed)
Pt refused vital signs check.
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
## 2023-10-22 DIAGNOSIS — F4320 Adjustment disorder, unspecified: Secondary | ICD-10-CM | POA: Diagnosis not present

## 2023-10-22 DIAGNOSIS — N451 Epididymitis: Secondary | ICD-10-CM | POA: Diagnosis not present

## 2023-10-22 DIAGNOSIS — H548 Legal blindness, as defined in USA: Secondary | ICD-10-CM | POA: Diagnosis not present

## 2023-10-22 NOTE — Plan of Care (Signed)
   Problem: Safety: Goal: Ability to remain free from injury will improve Outcome: Progressing

## 2023-10-22 NOTE — Progress Notes (Signed)
 Day RN said at shift change that pt does not have an IV and MD is okay with that.
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
## 2023-10-23 ENCOUNTER — Inpatient Hospital Stay: Payer: Medicare Other

## 2023-10-23 DIAGNOSIS — H548 Legal blindness, as defined in USA: Secondary | ICD-10-CM

## 2023-10-23 DIAGNOSIS — H9193 Unspecified hearing loss, bilateral: Secondary | ICD-10-CM | POA: Diagnosis not present

## 2023-10-23 DIAGNOSIS — R1032 Left lower quadrant pain: Secondary | ICD-10-CM

## 2023-10-23 DIAGNOSIS — F4320 Adjustment disorder, unspecified: Secondary | ICD-10-CM
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
## 2023-10-23 NOTE — Plan of Care (Signed)
   Problem: Safety: Goal: Ability to remain free from injury will improve Outcome: Progressing

## 2023-10-24 DIAGNOSIS — R1032 Left lower quadrant pain: Secondary | ICD-10-CM | POA: Diagnosis not present

## 2023-10-24 DIAGNOSIS — H9193 Unspecified hearing loss, bilateral: Secondary | ICD-10-CM | POA: Diagnosis not present

## 2023-10-24 DIAGNOSIS — F4320 Adjustment disorder, unspecified: Secondary | ICD-10-CM | POA: Diagnosis not present

## 2023-10-24 MED ORDER — LACTULOSE 10 GM/15ML PO SOLN
20.0000 g | Freq: Once | ORAL | Status: AC
  Administered 2023-10-24: 20 g via ORAL
  Filled 2023-10-24: qty 30
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
## 2023-10-25 DIAGNOSIS — N451 Epididymitis: Secondary | ICD-10-CM | POA: Diagnosis not present

## 2023-10-25 DIAGNOSIS — Z789 Other specified health status: Secondary | ICD-10-CM | POA: Diagnosis present

## 2023-10-25 NOTE — Assessment & Plan Note (Signed)
 Has been seen by psych. Deemed not to have capacity to make decisions. Awaiting guardianship hearing. Pt referred to APS.
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
## 2023-10-25 NOTE — Assessment & Plan Note (Signed)
 Pt needs sign language interpretor to communicate.

## 2023-10-25 NOTE — Assessment & Plan Note (Signed)
 Chronic. Pt with chronic constipation but refuses laxatives.

## 2023-10-25 NOTE — Subjective & Objective (Addendum)
Pt seen and observed. No ASL interpretor today. Pt has refused VS for over 48 hours. Has refused most medications for several days. Only inconsistently takes eye drops.  *update. Sign language interpretor [name] arrived around 3:45 pm. He states pt has behaved like this in the past. That when pt has bed covers pulled over his head and refuses to let them be pulled down that pt will be difficult to deal with. Pt already refusing most meds. No acute changes with him. Best not to disturb patient as there is not acute ongoing medical need.

## 2023-10-25 NOTE — Plan of Care (Signed)
   Problem: Safety: Goal: Ability to remain free from injury will improve Outcome: Progressing

## 2023-10-25 NOTE — Assessment & Plan Note (Signed)
 See RD note 10-17-2023 "Severe Malnutrition related to social / environmental circumstances as evidenced by moderate muscle depletion, severe muscle depletion, moderate fat depletion, severe fat depletion. "
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
## 2023-10-26 NOTE — Plan of Care (Signed)
  Problem: Safety: Goal: Ability to remain free from injury will improve Outcome: Progressing   
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
## 2023-10-27 NOTE — Plan of Care (Signed)
  Problem: Safety: Goal: Ability to remain free from injury will improve Outcome: Progressing   

## 2023-10-28 DIAGNOSIS — N451 Epididymitis: Secondary | ICD-10-CM | POA: Diagnosis not present

## 2023-10-28 DIAGNOSIS — F4323 Adjustment disorder with mixed anxiety and depressed mood: Secondary | ICD-10-CM | POA: Diagnosis not present

## 2023-10-28 DIAGNOSIS — F4320 Adjustment disorder, unspecified: Secondary | ICD-10-CM | POA: Diagnosis not present

## 2023-10-28 DIAGNOSIS — H9193 Unspecified hearing loss, bilateral: Secondary | ICD-10-CM | POA: Diagnosis not present
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
## 2023-10-28 NOTE — Plan of Care (Signed)
  Problem: Safety: Goal: Ability to remain free from injury will improve Outcome: Progressing   

## 2023-10-29 DIAGNOSIS — Z789 Other specified health status: Secondary | ICD-10-CM | POA: Diagnosis not present

## 2023-10-29 DIAGNOSIS — H9193 Unspecified hearing loss, bilateral: Secondary | ICD-10-CM | POA: Diagnosis not present

## 2023-10-29 DIAGNOSIS — H548 Legal blindness, as defined in USA: Secondary | ICD-10-CM | POA: Diagnosis not present

## 2023-10-29 DIAGNOSIS — N451 Epididymitis: Secondary | ICD-10-CM | POA: Diagnosis not present

## 2023-10-29 NOTE — Plan of Care (Signed)
  Problem: Safety: Goal: Ability to remain free from injury will improve Outcome: Progressing   
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
## 2023-10-30 DIAGNOSIS — H548 Legal blindness, as defined in USA: Secondary | ICD-10-CM | POA: Diagnosis not present

## 2023-10-30 DIAGNOSIS — I9589 Other hypotension: Secondary | ICD-10-CM | POA: Diagnosis not present

## 2023-10-30 DIAGNOSIS — H9193 Unspecified hearing loss, bilateral: Secondary | ICD-10-CM | POA: Diagnosis not present

## 2023-10-30 DIAGNOSIS — N451 Epididymitis: Secondary | ICD-10-CM | POA: Diagnosis not present

## 2023-10-30 NOTE — Progress Notes (Addendum)
Sign language interpreter at bedside to assist with patient assessments, medications and education of patient care.  Patient refused to allow staff to obtain vitals.

## 2023-10-30 NOTE — Plan of Care (Signed)
  Problem: Safety: Goal: Ability to remain free from injury will improve Outcome: Progressing   
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
## 2023-10-30 NOTE — Progress Notes (Signed)
Sign language interpreter requested via EPIC and via Annice Pih.

## 2023-10-31 NOTE — Plan of Care (Signed)
  Problem: Safety: Goal: Ability to remain free from injury will improve Outcome: Progressing   
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
## 2023-10-31 NOTE — TOC Progression Note (Signed)
Transition of Care [facility]) - Progression Note    Patient Details  Name: Martin [name] MRN: [id number] Date of Birth: 22-Aug-1956  Transition of [facility]) CM/SW Contact  Marlowe Sax, RN Phone Number: 10/31/2023, 11:08 AM  Clinical Narrative:     No updates on court date for Legal guardian hearing  Expected Discharge Plan:  (TBD) Barriers to Discharge: Continued Medical Work up  Expected Discharge Plan and Services                                               Social Determinants of Health (SDOH) Interventions SDOH Screenings   Food Insecurity: Patient Declined (08/29/2023)  Housing: Patient Declined (08/29/2023)  Transportation Needs: Patient Declined (08/29/2023)  Utilities: Patient Declined (08/29/2023)  Social Connections: Unknown (10/10/2023)  Tobacco Use: Low Risk  (04/29/2023)    Readmission Risk Interventions     No data to display

## 2023-11-01 DIAGNOSIS — E43 Unspecified severe protein-calorie malnutrition: Secondary | ICD-10-CM | POA: Diagnosis not present

## 2023-11-01 DIAGNOSIS — H9193 Unspecified hearing loss, bilateral: Secondary | ICD-10-CM | POA: Diagnosis not present

## 2023-11-01 DIAGNOSIS — F4323 Adjustment disorder with mixed anxiety and depressed mood: Secondary | ICD-10-CM | POA: Diagnosis not present

## 2023-11-01 NOTE — Progress Notes (Signed)
Attempted to give Martin [name] his nightly eye drops, pt refused to uncover his head with the blanket and when I attempted to get his attention with the white board he snatched it out of my hand trying to throw it. Charge Nurse informed. Pt refused nightly medication.
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
## 2023-11-02 NOTE — Plan of Care (Signed)
  Problem: Safety: Goal: Ability to remain free from injury will improve Outcome: Progressing   

## 2023-11-02 NOTE — Progress Notes (Signed)
Pt refused to communicate and kept head under covers. All nightly medication refused.

## 2023-11-02 NOTE — Progress Notes (Signed)
Came to the room with the interpreter, patient was upset that his lungs are misplaced.  We waited for him to reorganize his bags.  After that, he does not want talk to [place].  Not able to examine him.

## 2023-11-03 DIAGNOSIS — H548 Legal blindness, as defined in USA: Secondary | ICD-10-CM | POA: Diagnosis not present

## 2023-11-03 DIAGNOSIS — F4320 Adjustment disorder, unspecified: Secondary | ICD-10-CM | POA: Diagnosis not present

## 2023-11-03 DIAGNOSIS — H9193 Unspecified hearing loss, bilateral: Secondary | ICD-10-CM | POA: Diagnosis not present

## 2023-11-03 NOTE — Progress Notes (Signed)
Patient refusing most meds and VS. Also refusing assessment by this RN. Interpreter notified to assist with pt communication. Fall precautions in place.

## 2023-11-03 NOTE — Plan of Care (Signed)
Problem: Safety: Goal: Ability to remain free from injury will improve Outcome: Progressing

## 2023-11-03 NOTE — TOC Progression Note (Signed)
Transition of Care [facility]) - Progression Note    Patient Details  Name: Martin [name] MRN: [id number] Date of Birth: 06-05-1956  Transition of Care [facility]) CM/SW Contact  Marlowe Sax, RN Phone Number: 11/03/2023, 5:03 PM  Clinical Narrative:    Met with the patient and the interpreter in the room, the patient reports that he is concerned about his wife in [facility] and wants to have APS called there to report, I provided him with the information to call DSS to report a concern that he has, I explained again to him that I can not find him and apartment for him I can help with ALF, Group home or SNF and he stated that the does not want that   Expected Discharge Plan:  (TBD) Barriers to Discharge: Continued Medical Work up  Expected Discharge Plan and Services                                               Social Determinants of Health (SDOH) Interventions SDOH Screenings   Food Insecurity: Patient Declined (08/29/2023)  Housing: Patient Declined (08/29/2023)  Transportation Needs: Patient Declined (08/29/2023)  Utilities: Patient Declined (08/29/2023)  Social Connections: Unknown (10/10/2023)  Tobacco Use: Low Risk  (04/29/2023)    Readmission Risk Interventions     No data to display

## 2023-11-03 NOTE — Progress Notes (Signed)
Spoke with RN, patient has been using take his medications or vital signs. He did not open eyes for me.  Upon my exam, heart regular, no murmurs.  Abdomen soft, there is soft stool in the ostomy bag.  Extremities no edema. There is no treatment changes today.

## 2023-11-04 DIAGNOSIS — H548 Legal blindness, as defined in USA: Secondary | ICD-10-CM | POA: Diagnosis not present

## 2023-11-04 DIAGNOSIS — H9193 Unspecified hearing loss, bilateral: Secondary | ICD-10-CM | POA: Diagnosis not present

## 2023-11-04 DIAGNOSIS — Z933 Colostomy status: Secondary | ICD-10-CM | POA: Diagnosis not present

## 2023-11-04 MED ORDER — LACTULOSE 10 GM/15ML PO SOLN
20.0000 g | Freq: Once | ORAL | Status: AC
  Administered 2023-11-04: 20 g via ORAL
  Filled 2023-11-04: qty 30

## 2023-11-04 NOTE — Progress Notes (Signed)
Refused PO medications this shift, communicated by using whiteboard. Pt took eye drops and scheduled topical medication. Refused vitals signs.

## 2023-11-04 NOTE — Progress Notes (Signed)
Patient has expressed to nurse via ASL interpreter that he does not want vital signs taken anymore and would like only the doctor to complete assessments. MD notified.

## 2023-11-04 NOTE — Plan of Care (Signed)
Problem: Safety: Goal: Ability to remain free from injury will improve Outcome: Progressing
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
## 2023-11-05 DIAGNOSIS — H548 Legal blindness, as defined in USA: Secondary | ICD-10-CM | POA: Diagnosis not present

## 2023-11-05 DIAGNOSIS — K5981 Ogilvie syndrome: Secondary | ICD-10-CM

## 2023-11-05 DIAGNOSIS — H9193 Unspecified hearing loss, bilateral: Secondary | ICD-10-CM | POA: Diagnosis not present

## 2023-11-05 NOTE — Plan of Care (Signed)
Problem: Safety: Goal: Ability to remain free from injury will improve Outcome: Progressing
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
## 2023-11-06 DIAGNOSIS — H9193 Unspecified hearing loss, bilateral: Secondary | ICD-10-CM | POA: Diagnosis not present

## 2023-11-06 DIAGNOSIS — K5981 Ogilvie syndrome: Secondary | ICD-10-CM | POA: Diagnosis not present

## 2023-11-06 DIAGNOSIS — H548 Legal blindness, as defined in USA: Secondary | ICD-10-CM | POA: Diagnosis not present
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
## 2023-11-07 DIAGNOSIS — H548 Legal blindness, as defined in USA: Secondary | ICD-10-CM | POA: Diagnosis not present

## 2023-11-07 DIAGNOSIS — F4323 Adjustment disorder with mixed anxiety and depressed mood: Secondary | ICD-10-CM | POA: Diagnosis not present

## 2023-11-07 DIAGNOSIS — K5981 Ogilvie syndrome: Secondary | ICD-10-CM | POA: Diagnosis not present

## 2023-11-07 NOTE — Progress Notes (Signed)
Pt would not acknowledge this writer upon approach for assessment. Unable to complete assessment, vitals or empty colostomy bag so far this shift. Using urinal at bedside, emptied by this Clinical research associate.
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
## 2023-11-07 NOTE — Progress Notes (Signed)
136 Days Post-Op   Subjective/Chief Complaint: Patient seen at the request of hospitalist service for significantly elongated and thick toenails.  Possible ingrown toenail on the great toes.  Patient is legally blind and deaf and extremely difficult to communicate with.   Objective: Vital signs in last 24 hours:   Last BM Date : 11/07/23 (per ostomy)  Intake/Output from previous day: 01/27 0701 - 01/28 0700 In: 250 [P.O.:250] Out: 550 [Urine:550] Intake/Output this shift: Total I/O In: 360 [P.O.:360] Out: 475 [Urine:475]  Pulses are intact bilateral.  The skin is thin dry and atrophic with significant lower extremity muscle wasting.  All 10 toenails are thick, dystrophic, discolored, brittle with subungual debris.  The hallux nails are ingrown along the borders bilateral.  Some mild drainage and paronychia noted along the medial border of the right hallux nail.  Lab Results:  No results for input(s): "WBC", "HGB", "HCT", "PLT" in the last 72 hours. BMET No results for input(s): "NA", "K", "CL", "CO2", "GLUCOSE", "BUN", "CREATININE", "CALCIUM" in the last 72 hours. PT/INR No results for input(s): "LABPROT", "INR" in the last 72 hours. ABG No results for input(s): "PHART", "HCO3" in the last 72 hours.  Invalid input(s): "PCO2", "PO2"  Studies/Results: No results found.  Anti-infectives: Anti-infectives (From admission, onward)    Start     Dose/Rate Route Frequency Ordered Stop   08/10/23 1000  amoxicillin-clavulanate (AUGMENTIN) 875-125 MG per tablet 1 tablet  Status:  Discontinued        1 tablet Oral Every 12 hours 08/09/23 1624 08/09/23 1624   08/10/23 1000  azithromycin (ZITHROMAX) tablet 500 mg  Status:  Discontinued        500 mg Oral Daily 08/09/23 1624 08/09/23 1624   08/10/23 0800  levofloxacin (LEVAQUIN) tablet 500 mg  Status:  Discontinued        500 mg Oral Daily 08/09/23 1624 08/15/23 1330   08/07/23 1000  levofloxacin (LEVAQUIN) tablet 500 mg  Status:   Discontinued        500 mg Oral Daily 08/06/23 1351 08/09/23 1624   08/02/23 1230  levofloxacin (LEVAQUIN) tablet 500 mg  Status:  Discontinued        500 mg Oral Daily 08/02/23 1116 08/06/23 1351   06/30/23 1000  cefadroxil (DURICEF) capsule 1,000 mg        1,000 mg Oral 2 times daily 06/29/23 1038 07/07/23 2359   06/28/23 1015  cefTRIAXone (ROCEPHIN) 2 g in sodium chloride 0.9 % 100 mL IVPB  Status:  Discontinued        2 g 200 mL/hr over 30 Minutes Intravenous Every 24 hours 06/28/23 0915 06/29/23 1038   06/25/23 1000  cefTRIAXone (ROCEPHIN) 1 g in sodium chloride 0.9 % 100 mL IVPB  Status:  Discontinued        1 g 200 mL/hr over 30 Minutes Intravenous Every 24 hours 06/25/23 0510 06/28/23 0915   06/25/23 0200  vancomycin (VANCOCIN) IVPB 1000 mg/200 mL premix  Status:  Discontinued        1,000 mg 200 mL/hr over 60 Minutes Intravenous Every 24 hours 06/24/23 0244 06/27/23 1232   06/24/23 0600  cefTRIAXone (ROCEPHIN) 1 g in sodium chloride 0.9 % 100 mL IVPB  Status:  Discontinued        1 g 200 mL/hr over 30 Minutes Intravenous Every 24 hours 06/23/23 2142 06/25/23 0510   06/24/23 0145  vancomycin (VANCOREADY) IVPB 1500 mg/300 mL        1,500 mg 150 mL/hr over  120 Minutes Intravenous  Once 06/24/23 0049 06/24/23 0407   06/23/23 1715  vancomycin (VANCOCIN) IVPB 1000 mg/200 mL premix  Status:  Discontinued        1,000 mg 200 mL/hr over 60 Minutes Intravenous  Once 06/23/23 1712 06/24/23 0049   06/23/23 1715  ceFEPIme (MAXIPIME) 2 g in sodium chloride 0.9 % 100 mL IVPB        2 g 200 mL/hr over 30 Minutes Intravenous  Once 06/23/23 1712 06/23/23 2206       Assessment/Plan: s/p Procedure(s): ARTHROSCOPY KNEE  DEBRIDEMENT AND WASHOUT (Right) Assessment: 1.  Onychocryptosis with paronychia right hallux.  2.  Painful onychomycosis all 10 toes.  Plan: Debrided all 10 toenails in length and thickness sharply using toenail nippers.  Reconsult if any issues with continued paronychia on  the right hallux.  LOS: 137 days    Ricci Barker 11/07/2023

## 2023-11-07 NOTE — Plan of Care (Signed)
Problem: Safety: Goal: Ability to remain free from injury will improve Outcome: Progressing

## 2023-11-08 DIAGNOSIS — N451 Epididymitis: Secondary | ICD-10-CM | POA: Diagnosis not present

## 2023-11-08 NOTE — Plan of Care (Signed)
Problem: Safety: Goal: Ability to remain free from injury will improve Outcome: Progressing
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
## 2023-11-09 DIAGNOSIS — N451 Epididymitis: Secondary | ICD-10-CM | POA: Diagnosis not present

## 2023-11-09 NOTE — Plan of Care (Signed)
Problem: Safety: Goal: Ability to remain free from injury will improve Outcome: Progressing
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
## 2023-11-10 DIAGNOSIS — N451 Epididymitis: Secondary | ICD-10-CM | POA: Diagnosis not present

## 2023-11-10 MED ORDER — HYDROCERIN EX CREA
TOPICAL_CREAM | Freq: Every day | CUTANEOUS | Status: DC
  Administered 2023-12-05: 1 via TOPICAL
  Filled 2023-11-10: qty 113

## 2023-11-10 MED ORDER — POLYSACCHARIDE IRON COMPLEX 150 MG PO CAPS
150.0000 mg | ORAL_CAPSULE | Freq: Every day | ORAL | Status: DC
  Administered 2023-12-01: 150 mg via ORAL
  Filled 2023-11-10 (×58): qty 1

## 2023-11-10 MED ORDER — HYDROCERIN EX CREA: TOPICAL_CREAM | Freq: Every evening | CUTANEOUS | Status: DC

## 2023-11-10 MED ORDER — ADULT MULTIVITAMIN W/MINERALS CH
1.0000 | ORAL_TABLET | Freq: Every day | ORAL | Status: DC
  Administered 2023-11-11 – 2023-11-15 (×3): 1 via ORAL
  Filled 2023-11-10 (×6): qty 1

## 2023-11-10 MED ORDER — CLOTRIMAZOLE-BETAMETHASONE 1-0.05 % EX CREA
TOPICAL_CREAM | Freq: Two times a day (BID) | CUTANEOUS | Status: AC
  Administered 2023-12-05: 1 via TOPICAL
  Filled 2023-11-10 (×2): qty 15
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
## 2023-11-10 NOTE — Progress Notes (Signed)
Pt refused physical assessment and pushed away from communication board when trying to communicate with him. Pt layed in bed with covers hovering over his head. This is known to be his baseline unless he is requesting something.

## 2023-11-10 NOTE — Plan of Care (Signed)
   Problem: Safety: Goal: Ability to remain free from injury will improve Outcome: Progressing

## 2023-11-10 NOTE — Progress Notes (Signed)
Sign translator at bedside, physician arriving for rounds. Plan of care discussed with patient. Medications explained.

## 2023-11-11 DIAGNOSIS — N451 Epididymitis: Secondary | ICD-10-CM | POA: Diagnosis not present

## 2023-11-11 NOTE — Plan of Care (Signed)
   Problem: Safety: Goal: Ability to remain free from injury will improve Outcome: Progressing
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
## 2023-11-12 DIAGNOSIS — N451 Epididymitis: Secondary | ICD-10-CM | POA: Diagnosis not present

## 2023-11-12 NOTE — Plan of Care (Signed)
   Problem: Safety: Goal: Ability to remain free from injury will improve Outcome: Progressing
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
## 2023-11-12 NOTE — Progress Notes (Signed)
Sent this note to Dr Lucianne Muss, Dileep due to the pt and the interpreter asking for the dr to know the pt desire for a medication   "Pt is wanting his topical medication ie Diclofenac Sodium to be switced to a po form.

## 2023-11-13 DIAGNOSIS — N451 Epididymitis: Secondary | ICD-10-CM | POA: Diagnosis not present
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
## 2023-11-13 NOTE — Plan of Care (Signed)
   Problem: Safety: Goal: Ability to remain free from injury will improve Outcome: Progressing
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
## 2023-11-13 NOTE — TOC Progression Note (Signed)
Transition of Care [facility]) - Progression Note    Patient Details  Name: Martin [name] MRN: [id number] Date of Birth: 1955-12-19  [facility]) CM/SW Contact  Marlowe Sax, RN Phone Number: 11/13/2023, 4:39 PM  Clinical Narrative:    Met with the patient and his interpreter Amy, He was asking about his Medicaid and SS and Bank account, I saw a message from Medicaid to him and explained that they were telling him that his medicaid was on hold until he has a perm address, Amy will come tomorrow and assist him in calling the bank to answer questions about a new card and balance,  He has no additional TOC needs at this time  Expected Discharge Plan:  (TBD) Barriers to Discharge: Continued Medical Work up  Expected Discharge Plan and Services                                               Social Determinants of Health (SDOH) Interventions SDOH Screenings   Food Insecurity: Patient Declined (08/29/2023)  Housing: Patient Declined (08/29/2023)  Transportation Needs: Patient Declined (08/29/2023)  Utilities: Patient Declined (08/29/2023)  Social Connections: Unknown (10/10/2023)  Tobacco Use: Low Risk  (04/29/2023)    Readmission Risk Interventions     No data to display

## 2023-11-14 DIAGNOSIS — N451 Epididymitis: Secondary | ICD-10-CM | POA: Diagnosis not present
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
## 2023-11-15 DIAGNOSIS — N451 Epididymitis: Secondary | ICD-10-CM | POA: Diagnosis not present
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
## 2023-11-16 DIAGNOSIS — N451 Epididymitis: Secondary | ICD-10-CM | POA: Diagnosis not present

## 2023-11-16 MED ORDER — ADULT MULTIVITAMIN LIQUID CH
15.0000 mL | Freq: Every day | ORAL | Status: DC
  Administered 2023-11-17 – 2024-02-08 (×36): 15 mL via ORAL
  Filled 2023-11-16 (×91): qty 15
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
## 2023-11-16 NOTE — Plan of Care (Signed)
   Problem: Safety: Goal: Ability to remain free from injury will improve Outcome: Progressing

## 2023-11-17 DIAGNOSIS — N451 Epididymitis: Secondary | ICD-10-CM | POA: Diagnosis not present
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
## 2023-11-17 NOTE — Plan of Care (Signed)
   Problem: Safety: Goal: Ability to remain free from injury will improve Outcome: Progressing

## 2023-11-18 DIAGNOSIS — N451 Epididymitis: Secondary | ICD-10-CM | POA: Diagnosis not present

## 2023-11-18 NOTE — Plan of Care (Signed)
   Problem: Safety: Goal: Ability to remain free from injury will improve Outcome: Progressing

## 2023-11-18 NOTE — Progress Notes (Signed)
 RN changed pt colostomy bag.
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
## 2023-11-19 DIAGNOSIS — N451 Epididymitis: Secondary | ICD-10-CM | POA: Diagnosis not present

## 2023-11-19 MED ORDER — SIMETHICONE 80 MG PO CHEW
80.0000 mg | CHEWABLE_TABLET | Freq: Four times a day (QID) | ORAL | Status: DC
  Filled 2023-11-19 (×2): qty 1

## 2023-11-19 MED ORDER — SIMETHICONE 80 MG PO CHEW: 80.0000 mg | CHEWABLE_TABLET | Freq: Four times a day (QID) | ORAL | Status: DC | PRN

## 2023-11-19 NOTE — Plan of Care (Signed)
   Problem: Safety: Goal: Ability to remain free from injury will improve Outcome: Progressing
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
## 2023-11-20 DIAGNOSIS — N451 Epididymitis: Secondary | ICD-10-CM | POA: Diagnosis not present
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
## 2023-11-20 NOTE — Plan of Care (Signed)
   Problem: Safety: Goal: Ability to remain free from injury will improve Outcome: Progressing

## 2023-11-21 DIAGNOSIS — N451 Epididymitis: Secondary | ICD-10-CM | POA: Diagnosis not present
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
## 2023-11-21 NOTE — Plan of Care (Signed)
Problem: Safety: Goal: Ability to remain free from injury will improve Outcome: Progressing

## 2023-11-22 DIAGNOSIS — N451 Epididymitis: Secondary | ICD-10-CM | POA: Diagnosis not present

## 2023-11-22 MED ORDER — OXYCODONE HCL 5 MG PO TABS
5.0000 mg | ORAL_TABLET | Freq: Four times a day (QID) | ORAL | Status: DC | PRN
  Administered 2023-11-23 – 2024-01-05 (×24): 5 mg via ORAL
  Filled 2023-11-22 (×27): qty 1

## 2023-11-22 MED ORDER — IBUPROFEN 400 MG PO TABS: 400.0000 mg | ORAL_TABLET | Freq: Four times a day (QID) | ORAL | Status: DC | PRN

## 2023-11-22 NOTE — Plan of Care (Signed)
Problem: Safety: Goal: Ability to remain free from injury will improve Outcome: Progressing
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
## 2023-11-23 DIAGNOSIS — N451 Epididymitis: Secondary | ICD-10-CM | POA: Diagnosis not present
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
## 2023-11-23 NOTE — Plan of Care (Signed)
Problem: Safety: Goal: Ability to remain free from injury will improve Outcome: Progressing

## 2023-11-23 NOTE — Plan of Care (Signed)
  Problem: Safety: Goal: Ability to remain free from injury will improve 11/23/2023 2005 by Frann Rider D, LPN Outcome: Progressing 11/23/2023 2005 by Jomarie Longs, LPN Outcome: Progressing

## 2023-11-24 DIAGNOSIS — N451 Epididymitis: Secondary | ICD-10-CM | POA: Diagnosis not present

## 2023-11-24 NOTE — Plan of Care (Signed)
Problem: Safety: Goal: Ability to remain free from injury will improve Outcome: Progressing
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
## 2023-11-25 DIAGNOSIS — N451 Epididymitis: Secondary | ICD-10-CM | POA: Diagnosis not present

## 2023-11-25 NOTE — Progress Notes (Signed)
 Medications will be given when translator arrives, patient has been refusing most medications. Translator will instruct on which medications patient is willing to take today.
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
## 2023-11-25 NOTE — Plan of Care (Signed)
   Problem: Safety: Goal: Ability to remain free from injury will improve Outcome: Progressing

## 2023-11-26 DIAGNOSIS — N451 Epididymitis: Secondary | ICD-10-CM | POA: Diagnosis not present
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
## 2023-11-26 NOTE — Plan of Care (Signed)
   Problem: Safety: Goal: Ability to remain free from injury will improve Outcome: Progressing

## 2023-11-27 DIAGNOSIS — N451 Epididymitis: Secondary | ICD-10-CM | POA: Diagnosis not present

## 2023-11-27 NOTE — Plan of Care (Signed)
   Problem: Safety: Goal: Ability to remain free from injury will improve Outcome: Progressing
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
## 2023-11-28 DIAGNOSIS — N451 Epididymitis: Secondary | ICD-10-CM | POA: Diagnosis not present

## 2023-11-28 NOTE — Plan of Care (Signed)
   Problem: Safety: Goal: Ability to remain free from injury will improve Outcome: Progressing
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
## 2023-11-29 DIAGNOSIS — N451 Epididymitis: Secondary | ICD-10-CM | POA: Diagnosis not present

## 2023-11-29 DIAGNOSIS — Z789 Other specified health status: Secondary | ICD-10-CM | POA: Diagnosis not present

## 2023-11-29 DIAGNOSIS — E43 Unspecified severe protein-calorie malnutrition: Secondary | ICD-10-CM | POA: Diagnosis not present

## 2023-11-29 DIAGNOSIS — K9409 Other complications of colostomy: Secondary | ICD-10-CM

## 2023-11-29 DIAGNOSIS — R451 Restlessness and agitation: Principal | ICD-10-CM | POA: Diagnosis present

## 2023-11-29 DIAGNOSIS — F4323 Adjustment disorder with mixed anxiety and depressed mood: Secondary | ICD-10-CM | POA: Diagnosis not present

## 2023-11-29 NOTE — Plan of Care (Signed)
   Problem: Safety: Goal: Ability to remain free from injury will improve Outcome: Progressing
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
## 2023-11-30 DIAGNOSIS — F4323 Adjustment disorder with mixed anxiety and depressed mood: Secondary | ICD-10-CM | POA: Diagnosis not present

## 2023-11-30 DIAGNOSIS — Z789 Other specified health status: Secondary | ICD-10-CM | POA: Diagnosis not present

## 2023-11-30 DIAGNOSIS — N451 Epididymitis: Secondary | ICD-10-CM | POA: Diagnosis not present

## 2023-11-30 DIAGNOSIS — E43 Unspecified severe protein-calorie malnutrition: Secondary | ICD-10-CM | POA: Diagnosis not present
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
## 2023-12-01 DIAGNOSIS — Z789 Other specified health status: Secondary | ICD-10-CM | POA: Diagnosis not present

## 2023-12-01 DIAGNOSIS — E43 Unspecified severe protein-calorie malnutrition: Secondary | ICD-10-CM | POA: Diagnosis not present

## 2023-12-01 DIAGNOSIS — F4323 Adjustment disorder with mixed anxiety and depressed mood: Secondary | ICD-10-CM | POA: Diagnosis not present

## 2023-12-01 DIAGNOSIS — N451 Epididymitis: Secondary | ICD-10-CM | POA: Diagnosis not present

## 2023-12-01 NOTE — [place] FL2 (Signed)
 [place] MEDICAID FL2 LEVEL OF CARE FORM     IDENTIFICATION  Patient Name: Martin [name] Birthdate: 1956-02-13 Sex: male Admission Date (Current Location): 03/30/2023  [place] and [place] Number:  [name] [id number] Facility and Address:  [facility], [address] [place], [place] [address]      Provider Number: [id number]  Attending Physician Name and Address:  Marcelino Duster, MD  Relative Name and Phone Number:  None to be called    Current Level of Care: Hospital Recommended Level of Care: Nursing Facility Prior Approval Number:    Date Approved/Denied:   PASRR Number: [id number]  Discharge Plan: SNF    Current Diagnoses: Patient Active Problem List   Diagnosis Date Noted   Agitation 11/29/2023   Colostomy prolapse (HCC) 11/29/2023   Patient incapable of making informed decisions 10/25/2023   Protein-calorie malnutrition, severe 08/25/2023   Toenail fungus 08/08/2023   Epididymitis, bilateral 08/02/2023   Adjustment disorder 08/02/2023   Hypotension 08/01/2023   Impaired decision making 07/31/2023   Evaluation by psychiatric service required 07/29/2023   Iron deficiency anemia 07/27/2023   Colostomy status (HCC) 06/24/2023   Ogilvie's syndrome    Thrombocytopenia (HCC)    CKD (chronic kidney disease)    Septic joint of right knee joint (HCC) 12/21/2015   Chronic pain syndrome 12/21/2015   Legally blind 12/21/2015   Deaf 12/21/2015   Hypothyroidism, unspecified 12/21/2015   Chronic constipation     Orientation RESPIRATION BLADDER Height & Weight     Self, Time, Situation, Place  Normal Continent Weight: 60 kg Height:     BEHAVIORAL SYMPTOMS/MOOD NEUROLOGICAL BOWEL NUTRITION STATUS  Other (Comment) (has to be approched slowley due to deaf and blind, lightly touch on right shoulder to alert him of your presence, need sign ;language interpreter)   Colostomy Diet  AMBULATORY STATUS COMMUNICATION OF NEEDS Skin    Extensive Assist Non-Verbally Normal                       Personal Care Assistance Level of Assistance  Bathing, Dressing Bathing Assistance: Limited assistance Feeding assistance: Independent Dressing Assistance: Limited assistance     Functional Limitations Info  Sight, Hearing, Speech Sight Info: Impaired Hearing Info: Impaired (deaf) Speech Info: Impaired (non verbal, uses sign language)    SPECIAL CARE FACTORS FREQUENCY  PT (By licensed PT), OT (By licensed OT) (colosotmy care)     PT Frequency: 5 times per week OT Frequency: 5 times per week            Contractures Contractures Info: Not present    Additional Factors Info  Code Status, Allergies Code Status Info: full code Allergies Info: IV Dye Iodinated Contrast media           Current Medications (12/01/2023):  This is the current hospital active medication list Current Facility-Administered Medications  Medication Dose Route Frequency Provider Last Rate Last Admin   acetaminophen (TYLENOL) tablet 650 mg  650 mg Oral Q6H PRN Deeann Saint, MD   650 mg at 11/07/23 7322   Or   acetaminophen (TYLENOL) suppository 650 mg  650 mg Rectal Q6H PRN Deeann Saint, MD       alum & mag hydroxide-simeth (MAALOX/MYLANTA) 200-200-20 MG/5ML suspension 30 mL  30 mL Oral Q6H PRN Deeann Saint, MD   30 mL at 11/16/23 1100   camphor-menthol (SARNA) lotion   Topical PRN Alford Highland, MD   Given at 09/24/23 0215   clotrimazole-betamethasone (LOTRISONE) cream  Topical BID Gillis Santa, MD   Given at 11/24/23 1418   diclofenac Sodium (VOLTAREN) 1 % topical gel 4 g  4 g Topical QID Lolita Patella B, MD   4 g at 11/25/23 1236   diphenhydrAMINE (BENADRYL) capsule 50 mg  50 mg Oral Q6H PRN Delfino Lovett, MD   50 mg at 09/13/23 2353   dorzolamide (TRUSOPT) 2 % ophthalmic solution 1 drop  1 drop Both Eyes BID Jaynie Bream, RPH   1 drop at 12/01/23 1224   feeding supplement (ENSURE ENLIVE / ENSURE PLUS) liquid 237  mL  237 mL Oral BID BM Deeann Saint, MD   237 mL at 11/12/23 1206   hydrocerin (EUCERIN) cream   Topical QPC lunch Gillis Santa, MD   Given at 11/27/23 1248   ibuprofen (ADVIL) tablet 400 mg  400 mg Oral Q6H PRN Lurene Shadow, MD       iron polysaccharides (NIFEREX) capsule 150 mg  150 mg Oral Daily Gillis Santa, MD       latanoprost (XALATAN) 0.005 % ophthalmic solution 1 drop  1 drop Both Eyes QHS Deeann Saint, MD   1 drop at 11/30/23 2224   levothyroxine (SYNTHROID) tablet 125 mcg  125 mcg Oral Q24H Merryl Hacker, RPH   125 mcg at 11/30/23 1008   magnesium hydroxide (MILK OF MAGNESIA) suspension 30 mL  30 mL Oral Daily PRN Delfino Lovett, MD   30 mL at 11/30/23 1007   miconazole (MICOTIN) 2 % cream   Topical BID PRN Jonah Blue, MD   Given at 08/24/23 0901   multivitamin liquid 15 mL  15 mL Oral Daily Lolita Patella B, MD   15 mL at 11/30/23 1007   OLANZapine zydis (ZYPREXA) disintegrating tablet 5 mg  5 mg Oral BID PRN Deeann Saint, MD       ondansetron [facility]) tablet 4 mg  4 mg Oral Q6H PRN Deeann Saint, MD       Oral care mouth rinse  15 mL Mouth Rinse PRN Marcelino Duster, MD       oxyCODONE (Oxy IR/ROXICODONE) immediate release tablet 5 mg  5 mg Oral Q6H PRN Lurene Shadow, MD   5 mg at 11/30/23 1008   polyethylene glycol (MIRALAX / GLYCOLAX) packet 17 g  17 g Oral Daily PRN Marrion Coy, MD       polyvinyl alcohol (LIQUIFILM TEARS) 1.4 % ophthalmic solution 2 drop  2 drop Both Eyes PRN Deeann Saint, MD   2 drop at 07/17/23 1436   simethicone (MYLICON) chewable tablet 80 mg  80 mg Oral QID PRN Lolita Patella B, MD       traZODone (DESYREL) tablet 50 mg  50 mg Oral QHS PRN Deeann Saint, MD   50 mg at 04/19/23 0028   trolamine salicylate (ASPERCREME) 10 % cream   Topical BID PRN Deeann Saint, MD   Given at 06/09/23 1157   white petrolatum (VASELINE) gel   Topical PRN Marrion Coy, MD         Discharge Medications: Please see discharge summary for a list of  discharge medications.  Relevant Imaging Results:  Relevant Lab Results:   Additional Information SSN [id number]  Marlowe Sax, RN

## 2023-12-01 NOTE — Plan of Care (Signed)
   Problem: Safety: Goal: Ability to remain free from injury will improve Outcome: Progressing

## 2023-12-01 NOTE — TOC Progression Note (Signed)
 Transition of Care [facility]) - Progression Note    Patient Details  Name: Martin [name] MRN: [id number] Date of Birth: [birth year]  Transition of Care [facility]) CM/SW Contact  Marlowe Sax, RN Phone Number: 12/01/2023, 3:09 PM  Clinical Narrative:     Updated FL2, Resnt out looking for Nursing afacility Tammy with [facility] will come and eval  Expected Discharge Plan:  (TBD) Barriers to Discharge: Continued Medical Work up  Expected Discharge Plan and Services                                               Social Determinants of Health (SDOH) Interventions SDOH Screenings   Food Insecurity: Patient Declined (08/29/2023)  Housing: Patient Declined (08/29/2023)  Transportation Needs: Patient Declined (08/29/2023)  Utilities: Patient Declined (08/29/2023)  Social Connections: Unknown (10/10/2023)  Tobacco Use: Low Risk  (04/29/2023)    Readmission Risk Interventions     No data to display
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
## 2023-12-02 DIAGNOSIS — E43 Unspecified severe protein-calorie malnutrition: Secondary | ICD-10-CM | POA: Diagnosis not present

## 2023-12-02 DIAGNOSIS — N451 Epididymitis: Secondary | ICD-10-CM | POA: Diagnosis not present

## 2023-12-02 DIAGNOSIS — Z789 Other specified health status: Secondary | ICD-10-CM | POA: Diagnosis not present

## 2023-12-02 DIAGNOSIS — F4323 Adjustment disorder with mixed anxiety and depressed mood: Secondary | ICD-10-CM | POA: Diagnosis not present

## 2023-12-02 NOTE — Plan of Care (Signed)
   Problem: Safety: Goal: Ability to remain free from injury will improve Outcome: Progressing
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
## 2023-12-03 DIAGNOSIS — E43 Unspecified severe protein-calorie malnutrition: Secondary | ICD-10-CM | POA: Diagnosis not present

## 2023-12-03 DIAGNOSIS — Z789 Other specified health status: Secondary | ICD-10-CM | POA: Diagnosis not present

## 2023-12-03 DIAGNOSIS — N451 Epididymitis: Secondary | ICD-10-CM | POA: Diagnosis not present

## 2023-12-03 DIAGNOSIS — F4323 Adjustment disorder with mixed anxiety and depressed mood: Secondary | ICD-10-CM | POA: Diagnosis not present
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
## 2023-12-03 NOTE — Plan of Care (Signed)
   Problem: Safety: Goal: Ability to remain free from injury will improve Outcome: Progressing

## 2023-12-04 DIAGNOSIS — N451 Epididymitis: Secondary | ICD-10-CM | POA: Diagnosis not present

## 2023-12-04 DIAGNOSIS — E43 Unspecified severe protein-calorie malnutrition: Secondary | ICD-10-CM | POA: Diagnosis not present

## 2023-12-04 DIAGNOSIS — F4323 Adjustment disorder with mixed anxiety and depressed mood: Secondary | ICD-10-CM | POA: Diagnosis not present

## 2023-12-04 DIAGNOSIS — Z789 Other specified health status: Secondary | ICD-10-CM | POA: Diagnosis not present

## 2023-12-04 NOTE — Plan of Care (Signed)
   Problem: Safety: Goal: Ability to remain free from injury will improve Outcome: Progressing

## 2023-12-04 NOTE — TOC Progression Note (Signed)
 Transition of Care [facility]) - Progression Note    Patient Details  Name: Martin [name] MRN: [id number] Date of Birth: Dec 10, 1955  [facility]) CM/SW Contact  Marlowe Sax, RN Phone Number: 12/04/2023, 11:49 AM  Clinical Narrative:     Spoke with Tammy from [facility], they will not be able to make a bed offer for this patient with any of their facilities   Expected Discharge Plan:  (TBD) Barriers to Discharge: Continued Medical Work up  Expected Discharge Plan and Services                                               Social Determinants of Health (SDOH) Interventions SDOH Screenings   Food Insecurity: Patient Declined (08/29/2023)  Housing: Patient Declined (08/29/2023)  Transportation Needs: Patient Declined (08/29/2023)  Utilities: Patient Declined (08/29/2023)  Social Connections: Unknown (10/10/2023)  Tobacco Use: Low Risk  (04/29/2023)    Readmission Risk Interventions     No data to display
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
## 2023-12-05 DIAGNOSIS — E43 Unspecified severe protein-calorie malnutrition: Secondary | ICD-10-CM | POA: Diagnosis not present

## 2023-12-05 DIAGNOSIS — F4323 Adjustment disorder with mixed anxiety and depressed mood: Secondary | ICD-10-CM | POA: Diagnosis not present

## 2023-12-05 DIAGNOSIS — Z789 Other specified health status: Secondary | ICD-10-CM | POA: Diagnosis not present

## 2023-12-05 DIAGNOSIS — N451 Epididymitis: Secondary | ICD-10-CM | POA: Diagnosis not present
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
## 2023-12-06 DIAGNOSIS — N451 Epididymitis: Secondary | ICD-10-CM | POA: Diagnosis not present
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
## 2023-12-07 DIAGNOSIS — N451 Epididymitis: Secondary | ICD-10-CM | POA: Diagnosis not present

## 2023-12-07 NOTE — Plan of Care (Signed)
  Problem: Safety: Goal: Ability to remain free from injury will improve 12/07/2023 2356 by Danford Bad, RN Outcome: Progressing 12/07/2023 2355 by Danford Bad, RN Outcome: Progressing
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
## 2023-12-08 DIAGNOSIS — R4589 Other symptoms and signs involving emotional state: Secondary | ICD-10-CM

## 2023-12-08 DIAGNOSIS — Z659 Problem related to unspecified psychosocial circumstances: Secondary | ICD-10-CM | POA: Diagnosis present

## 2023-12-08 DIAGNOSIS — R4689 Other symptoms and signs involving appearance and behavior: Secondary | ICD-10-CM
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
## 2023-12-09 DIAGNOSIS — Z659 Problem related to unspecified psychosocial circumstances: Secondary | ICD-10-CM | POA: Diagnosis not present

## 2023-12-09 DIAGNOSIS — R4689 Other symptoms and signs involving appearance and behavior: Secondary | ICD-10-CM | POA: Diagnosis not present

## 2023-12-09 DIAGNOSIS — R4589 Other symptoms and signs involving emotional state: Secondary | ICD-10-CM | POA: Diagnosis not present

## 2023-12-09 MED ORDER — MENTHOL 3 MG MT LOZG
1.0000 | LOZENGE | OROMUCOSAL | Status: DC | PRN
  Filled 2023-12-09: qty 9
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
## 2023-12-10 DIAGNOSIS — Z659 Problem related to unspecified psychosocial circumstances: Secondary | ICD-10-CM | POA: Diagnosis not present

## 2023-12-10 DIAGNOSIS — R4589 Other symptoms and signs involving emotional state: Secondary | ICD-10-CM | POA: Diagnosis not present

## 2023-12-10 DIAGNOSIS — R4689 Other symptoms and signs involving appearance and behavior: Secondary | ICD-10-CM | POA: Diagnosis not present

## 2023-12-10 NOTE — Progress Notes (Signed)
 Patient has refused all care and medications except his eye drops, will continue to assess and monitor.
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
## 2023-12-10 NOTE — Progress Notes (Signed)
 Patient refused assessment and medications. Attempted to perform full assessment but would get highly upset and try to push away. Attempted to burp ostomy bag, but pushed away and would not allow. Patient does not appear to be in any distress at this time. Rounding performed.Will continue to monitor.
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
## 2023-12-10 NOTE — Plan of Care (Signed)
  Problem: Safety: Goal: Ability to remain free from injury will improve Outcome: Not Progressing   

## 2023-12-11 DIAGNOSIS — R4589 Other symptoms and signs involving emotional state: Secondary | ICD-10-CM | POA: Diagnosis not present

## 2023-12-11 DIAGNOSIS — R4689 Other symptoms and signs involving appearance and behavior: Secondary | ICD-10-CM | POA: Diagnosis not present

## 2023-12-11 DIAGNOSIS — Z659 Problem related to unspecified psychosocial circumstances: Secondary | ICD-10-CM | POA: Diagnosis not present

## 2023-12-11 NOTE — Plan of Care (Signed)
   Problem: Safety: Goal: Ability to remain free from injury will improve Outcome: Progressing
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
## 2023-12-12 DIAGNOSIS — R4689 Other symptoms and signs involving appearance and behavior: Secondary | ICD-10-CM | POA: Diagnosis not present

## 2023-12-12 DIAGNOSIS — R4589 Other symptoms and signs involving emotional state: Secondary | ICD-10-CM | POA: Diagnosis not present

## 2023-12-12 DIAGNOSIS — Z659 Problem related to unspecified psychosocial circumstances: Secondary | ICD-10-CM | POA: Diagnosis not present

## 2023-12-12 NOTE — TOC Progression Note (Signed)
 Transition of Care [facility]) - Progression Note    Patient Details  Name: Martin [name] MRN: [id number] Date of Birth: 22-Aug-1956  Transition of Care [facility] CM/SW Contact  Marlowe Sax, RN Phone Number: 12/12/2023, 2:00 PM  Clinical Narrative:      Met with DSS they stated that the court hearing is still pending, I let them know that there are not bed offers and we will need assistance placing the patient Expected Discharge Plan:  (TBD) Barriers to Discharge: Continued Medical Work up  Expected Discharge Plan and Services                                               Social Determinants of Health (SDOH) Interventions SDOH Screenings   Food Insecurity: Patient Declined (08/29/2023)  Housing: Patient Declined (08/29/2023)  Transportation Needs: Patient Declined (08/29/2023)  Utilities: Patient Declined (08/29/2023)  Social Connections: Unknown (10/10/2023)  Tobacco Use: Low Risk  (04/29/2023)    Readmission Risk Interventions     No data to display

## 2023-12-12 NOTE — Plan of Care (Signed)
   Problem: Safety: Goal: Ability to remain free from injury will improve Outcome: Progressing
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
## 2023-12-13 DIAGNOSIS — Z659 Problem related to unspecified psychosocial circumstances: Secondary | ICD-10-CM | POA: Diagnosis not present

## 2023-12-13 DIAGNOSIS — R4689 Other symptoms and signs involving appearance and behavior: Secondary | ICD-10-CM | POA: Diagnosis not present

## 2023-12-13 DIAGNOSIS — R4589 Other symptoms and signs involving emotional state: Secondary | ICD-10-CM | POA: Diagnosis not present
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
## 2023-12-13 NOTE — Plan of Care (Signed)
   Problem: Safety: Goal: Ability to remain free from injury will improve Outcome: Progressing

## 2023-12-14 DIAGNOSIS — Z789 Other specified health status: Secondary | ICD-10-CM | POA: Diagnosis not present

## 2023-12-14 DIAGNOSIS — H548 Legal blindness, as defined in USA: Secondary | ICD-10-CM | POA: Diagnosis not present

## 2023-12-14 DIAGNOSIS — E43 Unspecified severe protein-calorie malnutrition: Secondary | ICD-10-CM | POA: Diagnosis not present

## 2023-12-14 DIAGNOSIS — R4689 Other symptoms and signs involving appearance and behavior: Secondary | ICD-10-CM | POA: Diagnosis not present

## 2023-12-14 NOTE — Plan of Care (Signed)
   Problem: Safety: Goal: Ability to remain free from injury will improve Outcome: Progressing

## 2023-12-14 NOTE — Plan of Care (Signed)
 Patient awake, alert and without distress. Patient refused all assessment and mobility. Patient did allow me to administer his evening Latanoprost eye drops. Will continue to monitor.   Problem: Safety: Goal: Ability to remain free from injury will improve Outcome: Progressing
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
## 2023-12-15 DIAGNOSIS — E43 Unspecified severe protein-calorie malnutrition: Secondary | ICD-10-CM | POA: Diagnosis not present

## 2023-12-15 DIAGNOSIS — R4689 Other symptoms and signs involving appearance and behavior: Secondary | ICD-10-CM | POA: Diagnosis not present

## 2023-12-15 DIAGNOSIS — H548 Legal blindness, as defined in USA: Secondary | ICD-10-CM | POA: Diagnosis not present

## 2023-12-15 DIAGNOSIS — Z789 Other specified health status: Secondary | ICD-10-CM | POA: Diagnosis not present

## 2023-12-15 NOTE — Plan of Care (Signed)
   Problem: Safety: Goal: Ability to remain free from injury will improve Outcome: Progressing
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
## 2023-12-15 NOTE — Progress Notes (Signed)
 Patient reported feeling like "throwing up" Emesis bag provided. Nurse attempted to obtain a set of vitals - patient refused. Nurse attempted to offer Zofran via communication board and patient refused to read any notes from the nurse.   Nurse will will re attempt at a later time and date.

## 2023-12-16 DIAGNOSIS — R4589 Other symptoms and signs involving emotional state: Secondary | ICD-10-CM | POA: Diagnosis not present

## 2023-12-16 DIAGNOSIS — R4689 Other symptoms and signs involving appearance and behavior: Secondary | ICD-10-CM | POA: Diagnosis not present

## 2023-12-16 DIAGNOSIS — Z659 Problem related to unspecified psychosocial circumstances: Secondary | ICD-10-CM | POA: Diagnosis not present

## 2023-12-16 NOTE — Progress Notes (Signed)
 Pt refuses medication and care at this time. Q2h rounding performed. Will continue to monitor.

## 2023-12-16 NOTE — Plan of Care (Signed)
  Problem: Safety: Goal: Ability to remain free from injury will improve Outcome: Progressing Note: Pt still refuses some care at this time
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
## 2023-12-17 DIAGNOSIS — Z659 Problem related to unspecified psychosocial circumstances: Secondary | ICD-10-CM | POA: Diagnosis not present

## 2023-12-17 DIAGNOSIS — R4689 Other symptoms and signs involving appearance and behavior: Secondary | ICD-10-CM | POA: Diagnosis not present

## 2023-12-17 DIAGNOSIS — R4589 Other symptoms and signs involving emotional state: Secondary | ICD-10-CM | POA: Diagnosis not present

## 2023-12-17 NOTE — Plan of Care (Signed)
   Problem: Safety: Goal: Ability to remain free from injury will improve Outcome: Progressing
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
## 2023-12-18 DIAGNOSIS — Z789 Other specified health status: Secondary | ICD-10-CM | POA: Diagnosis not present

## 2023-12-18 DIAGNOSIS — R4689 Other symptoms and signs involving appearance and behavior: Secondary | ICD-10-CM | POA: Diagnosis not present

## 2023-12-18 DIAGNOSIS — E43 Unspecified severe protein-calorie malnutrition: Secondary | ICD-10-CM | POA: Diagnosis not present

## 2023-12-18 DIAGNOSIS — H548 Legal blindness, as defined in USA: Secondary | ICD-10-CM | POA: Diagnosis not present

## 2023-12-18 NOTE — Plan of Care (Signed)
   Problem: Safety: Goal: Ability to remain free from injury will improve Outcome: Progressing
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
## 2023-12-19 DIAGNOSIS — R4689 Other symptoms and signs involving appearance and behavior: Secondary | ICD-10-CM | POA: Diagnosis not present

## 2023-12-19 DIAGNOSIS — E43 Unspecified severe protein-calorie malnutrition: Secondary | ICD-10-CM | POA: Diagnosis not present

## 2023-12-19 DIAGNOSIS — H548 Legal blindness, as defined in USA: Secondary | ICD-10-CM | POA: Diagnosis not present

## 2023-12-19 DIAGNOSIS — Z789 Other specified health status: Secondary | ICD-10-CM | POA: Diagnosis not present

## 2023-12-19 NOTE — Plan of Care (Signed)
   Problem: Safety: Goal: Ability to remain free from injury will improve Outcome: Progressing
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
## 2023-12-20 DIAGNOSIS — R4689 Other symptoms and signs involving appearance and behavior: Secondary | ICD-10-CM | POA: Diagnosis not present

## 2023-12-20 DIAGNOSIS — Z789 Other specified health status: Secondary | ICD-10-CM | POA: Diagnosis not present

## 2023-12-20 DIAGNOSIS — H548 Legal blindness, as defined in USA: Secondary | ICD-10-CM | POA: Diagnosis not present

## 2023-12-20 DIAGNOSIS — E43 Unspecified severe protein-calorie malnutrition: Secondary | ICD-10-CM | POA: Diagnosis not present
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
## 2023-12-21 DIAGNOSIS — Z789 Other specified health status: Secondary | ICD-10-CM | POA: Diagnosis not present

## 2023-12-21 DIAGNOSIS — H548 Legal blindness, as defined in USA: Secondary | ICD-10-CM | POA: Diagnosis not present

## 2023-12-21 DIAGNOSIS — R4689 Other symptoms and signs involving appearance and behavior: Secondary | ICD-10-CM | POA: Diagnosis not present

## 2023-12-21 DIAGNOSIS — E43 Unspecified severe protein-calorie malnutrition: Secondary | ICD-10-CM | POA: Diagnosis not present
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
## 2023-12-21 NOTE — Plan of Care (Signed)
   Problem: Safety: Goal: Ability to remain free from injury will improve Outcome: Progressing

## 2023-12-22 DIAGNOSIS — R4689 Other symptoms and signs involving appearance and behavior: Secondary | ICD-10-CM | POA: Diagnosis not present

## 2023-12-22 DIAGNOSIS — R4589 Other symptoms and signs involving emotional state: Secondary | ICD-10-CM | POA: Diagnosis not present

## 2023-12-22 DIAGNOSIS — Z659 Problem related to unspecified psychosocial circumstances: Secondary | ICD-10-CM | POA: Diagnosis not present

## 2023-12-22 NOTE — Plan of Care (Signed)
   Problem: Safety: Goal: Ability to remain free from injury will improve Outcome: Progressing

## 2023-12-22 NOTE — Assessment & Plan Note (Signed)
 Noted.

## 2023-12-22 NOTE — Assessment & Plan Note (Signed)
 As needed zyprexa.
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
## 2023-12-22 NOTE — TOC Progression Note (Signed)
 Transition of Care [facility]) - Progression Note    Patient Details  Name: Martin [name] MRN: [id number] Date of Birth: [birth year]  [facility] CM/SW Contact  Marlowe Sax, RN Phone Number: 12/22/2023, 9:45 AM  Clinical Narrative:     No new updates, no bed offers, dss working on getting court hearing  Expected Discharge Plan:  (TBD) Barriers to Discharge: Continued Medical Work up  Expected Discharge Plan and Services                                               Social Determinants of Health (SDOH) Interventions SDOH Screenings   Food Insecurity: Patient Declined (08/29/2023)  Housing: Patient Declined (08/29/2023)  Transportation Needs: Patient Declined (08/29/2023)  Utilities: Patient Declined (08/29/2023)  Social Connections: Unknown (10/10/2023)  Tobacco Use: Low Risk  (04/29/2023)    Readmission Risk Interventions     No data to display

## 2023-12-23 DIAGNOSIS — R4689 Other symptoms and signs involving appearance and behavior: Secondary | ICD-10-CM | POA: Diagnosis not present

## 2023-12-23 DIAGNOSIS — Z659 Problem related to unspecified psychosocial circumstances: Secondary | ICD-10-CM | POA: Diagnosis not present

## 2023-12-23 DIAGNOSIS — L603 Nail dystrophy: Secondary | ICD-10-CM | POA: Diagnosis present

## 2023-12-23 DIAGNOSIS — R4589 Other symptoms and signs involving emotional state: Secondary | ICD-10-CM | POA: Diagnosis not present
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
## 2023-12-23 NOTE — Plan of Care (Signed)
   Problem: Safety: Goal: Ability to remain free from injury will improve Outcome: Progressing

## 2023-12-23 NOTE — Consult Note (Signed)
.    PODIATRY CONSULTATION  NAME Martin [name] MRN [id number] DOB 01/02/1956 DOA 03/30/2023   Reason for consult:  Chief Complaint  Patient presents with   Placement/Boarder    Attending/Consulting physician: Fran Lowes, DO  History of present illness: 68 y.o. male who is here primarily for placement issue. He is deaf and legally blind. 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)        Latest Ref Rng & Units 09/26/2023    2:49 AM 07/05/2023    5:18 PM 06/27/2023   12:40 PM  CBC  WBC 4.0 - 10.5 K/uL 6.8  4.5  4.9   Hemoglobin 13.0 - 17.0 g/dL 82.9  8.5  9.0   Hematocrit 39.0 - 52.0 % 32.5  27.0  28.2   Platelets 150 - 400 K/uL 155  199  181        Latest Ref Rng & Units 09/26/2023    2:49 AM 07/05/2023    5:18 PM 06/27/2023   12:40 PM  BMP  Glucose 70 - 99 mg/dL 93  562  130   BUN 8 - 23 mg/dL 24  21  24    Creatinine 0.61 - 1.24 mg/dL 8.65  7.84  6.96   Sodium 135 - 145 mmol/L 132  136  136   Potassium 3.5 - 5.1 mmol/L 4.5  4.0  4.1   Chloride 98 - 111 mmol/L 100  97  98   CO2 22 - 32 mmol/L 24  30  29    Calcium 8.9 - 10.3 mg/dL 9.1  9.3  9.8       Physical Exam: Lower Extremity Exam Vasc: R - PT palpable, DP palpable. Cap refill < 3 sec to digits  L - PT palpable, DP palpable. Cap refill <3 sec to digits  Derm: Nailplates x10 mildy thickened, mildy elongated. No signs of infection or signs of nails causing wound  MSK:  Muscle strength intact. Bunion and hammertoe deformities  Neuro: R - Gross sensation intact. Gross motor function intact   L - Gross sensation intact. Gross motor function intact    ASSESSMENT/PLAN OF CARE 68 y.o. male with PMHx significant for blindness, deafness, Ogilvie's syndrome, hypothyroidism, podiatry consulted for painful toenails  - Nails  x10 were trimmed without incident using sterile nippers - no signs of acute infection or nails causing potential wounds -Podiatry signing off   Thank you for the consult.  Please contact me directly with any questions or concerns.           Bronwen Betters, DPM [facility] / [facility]    [address] [place], [place] [address]                Office [phone]  Fax [phone]

## 2023-12-24 DIAGNOSIS — Z659 Problem related to unspecified psychosocial circumstances: Secondary | ICD-10-CM | POA: Diagnosis not present

## 2023-12-24 DIAGNOSIS — R4589 Other symptoms and signs involving emotional state: Secondary | ICD-10-CM | POA: Diagnosis not present

## 2023-12-24 DIAGNOSIS — R4689 Other symptoms and signs involving appearance and behavior: Secondary | ICD-10-CM | POA: Diagnosis not present

## 2023-12-24 NOTE — Plan of Care (Signed)
   Problem: Safety: Goal: Ability to remain free from injury will improve Outcome: Progressing
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
## 2023-12-24 NOTE — Assessment & Plan Note (Signed)
 Podiatry trimmed nails on 12/23/2023.

## 2023-12-25 DIAGNOSIS — R4689 Other symptoms and signs involving appearance and behavior: Secondary | ICD-10-CM | POA: Diagnosis not present

## 2023-12-25 DIAGNOSIS — Z659 Problem related to unspecified psychosocial circumstances: Secondary | ICD-10-CM | POA: Diagnosis not present

## 2023-12-25 DIAGNOSIS — R4589 Other symptoms and signs involving emotional state: Secondary | ICD-10-CM | POA: Diagnosis not present
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
## 2023-12-25 NOTE — Plan of Care (Signed)
   Problem: Safety: Goal: Ability to remain free from injury will improve Outcome: Progressing

## 2023-12-26 DIAGNOSIS — R4589 Other symptoms and signs involving emotional state: Secondary | ICD-10-CM | POA: Diagnosis not present

## 2023-12-26 DIAGNOSIS — Z659 Problem related to unspecified psychosocial circumstances: Secondary | ICD-10-CM | POA: Diagnosis not present

## 2023-12-26 DIAGNOSIS — R4689 Other symptoms and signs involving appearance and behavior: Secondary | ICD-10-CM | POA: Diagnosis not present

## 2023-12-26 NOTE — Plan of Care (Signed)
   Problem: Safety: Goal: Ability to remain free from injury will improve Outcome: Progressing
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
## 2023-12-27 DIAGNOSIS — H548 Legal blindness, as defined in USA: Secondary | ICD-10-CM | POA: Diagnosis not present

## 2023-12-27 DIAGNOSIS — Z789 Other specified health status: Secondary | ICD-10-CM | POA: Diagnosis not present

## 2023-12-27 DIAGNOSIS — R4689 Other symptoms and signs involving appearance and behavior: Secondary | ICD-10-CM | POA: Diagnosis not present

## 2023-12-27 DIAGNOSIS — E43 Unspecified severe protein-calorie malnutrition: Secondary | ICD-10-CM | POA: Diagnosis not present
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
## 2023-12-27 NOTE — Plan of Care (Signed)
   Problem: Safety: Goal: Ability to remain free from injury will improve Outcome: Progressing

## 2023-12-27 NOTE — TOC Progression Note (Signed)
 Transition of Care [facility]) - Progression Note    Patient Details  Name: Martin [name] MRN: [id number] Date of Birth: 12/24/1955  [facility] CM/SW Contact  Marlowe Sax, RN Phone Number: 12/27/2023, 1:26 PM  Clinical Narrative:     Court date is 4/7 for guardianship   Expected Discharge Plan:  (TBD) Barriers to Discharge: Continued Medical Work up  Expected Discharge Plan and Services                                               Social Determinants of Health (SDOH) Interventions SDOH Screenings   Food Insecurity: Patient Declined (08/29/2023)  Housing: Patient Declined (08/29/2023)  Transportation Needs: Patient Declined (08/29/2023)  Utilities: Patient Declined (08/29/2023)  Social Connections: Unknown (10/10/2023)  Tobacco Use: Low Risk  (04/29/2023)    Readmission Risk Interventions     No data to display

## 2023-12-28 DIAGNOSIS — H548 Legal blindness, as defined in USA: Secondary | ICD-10-CM | POA: Diagnosis not present

## 2023-12-28 DIAGNOSIS — R4689 Other symptoms and signs involving appearance and behavior: Secondary | ICD-10-CM | POA: Diagnosis not present

## 2023-12-28 DIAGNOSIS — E43 Unspecified severe protein-calorie malnutrition: Secondary | ICD-10-CM | POA: Diagnosis not present

## 2023-12-28 DIAGNOSIS — Z789 Other specified health status: Secondary | ICD-10-CM | POA: Diagnosis not present
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
## 2023-12-28 NOTE — Plan of Care (Signed)
   Problem: Safety: Goal: Ability to remain free from injury will improve Outcome: Progressing

## 2023-12-29 DIAGNOSIS — H548 Legal blindness, as defined in USA: Secondary | ICD-10-CM | POA: Diagnosis not present

## 2023-12-29 DIAGNOSIS — E43 Unspecified severe protein-calorie malnutrition: Secondary | ICD-10-CM | POA: Diagnosis not present

## 2023-12-29 DIAGNOSIS — R4689 Other symptoms and signs involving appearance and behavior: Secondary | ICD-10-CM | POA: Diagnosis not present

## 2023-12-29 DIAGNOSIS — Z789 Other specified health status: Secondary | ICD-10-CM | POA: Diagnosis not present

## 2023-12-29 NOTE — Plan of Care (Signed)
   Problem: Safety: Goal: Ability to remain free from injury will improve Outcome: Progressing
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
## 2023-12-30 DIAGNOSIS — R4589 Other symptoms and signs involving emotional state: Secondary | ICD-10-CM | POA: Diagnosis not present

## 2023-12-30 DIAGNOSIS — R4689 Other symptoms and signs involving appearance and behavior: Secondary | ICD-10-CM | POA: Diagnosis not present

## 2023-12-30 DIAGNOSIS — Z659 Problem related to unspecified psychosocial circumstances: Secondary | ICD-10-CM | POA: Diagnosis not present
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
## 2023-12-30 NOTE — Plan of Care (Signed)
   Problem: Safety: Goal: Ability to remain free from injury will improve Outcome: Progressing

## 2023-12-31 DIAGNOSIS — Z659 Problem related to unspecified psychosocial circumstances: Secondary | ICD-10-CM | POA: Diagnosis not present

## 2023-12-31 DIAGNOSIS — R4689 Other symptoms and signs involving appearance and behavior: Secondary | ICD-10-CM | POA: Diagnosis not present

## 2023-12-31 DIAGNOSIS — R4589 Other symptoms and signs involving emotional state: Secondary | ICD-10-CM | POA: Diagnosis not present

## 2023-12-31 NOTE — Plan of Care (Signed)
   Problem: Safety: Goal: Ability to remain free from injury will improve Outcome: Progressing
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
## 2024-01-01 DIAGNOSIS — R4689 Other symptoms and signs involving appearance and behavior: Secondary | ICD-10-CM | POA: Diagnosis not present

## 2024-01-01 DIAGNOSIS — Z659 Problem related to unspecified psychosocial circumstances: Secondary | ICD-10-CM | POA: Diagnosis not present

## 2024-01-01 DIAGNOSIS — R4589 Other symptoms and signs involving emotional state: Secondary | ICD-10-CM | POA: Diagnosis not present
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
## 2024-01-02 DIAGNOSIS — R4689 Other symptoms and signs involving appearance and behavior: Secondary | ICD-10-CM | POA: Diagnosis not present

## 2024-01-02 DIAGNOSIS — R4589 Other symptoms and signs involving emotional state: Secondary | ICD-10-CM | POA: Diagnosis not present

## 2024-01-02 DIAGNOSIS — Z659 Problem related to unspecified psychosocial circumstances: Secondary | ICD-10-CM | POA: Diagnosis not present

## 2024-01-02 NOTE — TOC Progression Note (Signed)
 Transition of Care [facility]) - Progression Note    Patient Details  Name: Martin [name] MRN: [id number] Date of Birth: Apr 12, 1956  Transition of Care [facility]) CM/SW Contact  Marlowe Sax, RN Phone Number: 01/02/2024, 10:30 AM  Clinical Narrative:    No update for the patient, Court hearing for Legal guardianship with DSS still pending, no facilities willing to accept the patient   Expected Discharge Plan:  (TBD) Barriers to Discharge: Continued Medical Work up  Expected Discharge Plan and Services                                               Social Determinants of Health (SDOH) Interventions SDOH Screenings   Food Insecurity: Patient Declined (08/29/2023)  Housing: Patient Declined (08/29/2023)  Transportation Needs: Patient Declined (08/29/2023)  Utilities: Patient Declined (08/29/2023)  Social Connections: Unknown (10/10/2023)  Tobacco Use: Low Risk  (04/29/2023)    Readmission Risk Interventions     No data to display

## 2024-01-02 NOTE — Plan of Care (Signed)
   Problem: Safety: Goal: Ability to remain free from injury will improve Outcome: Progressing
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
## 2024-01-03 DIAGNOSIS — R4589 Other symptoms and signs involving emotional state: Secondary | ICD-10-CM | POA: Diagnosis not present

## 2024-01-03 DIAGNOSIS — R4689 Other symptoms and signs involving appearance and behavior: Secondary | ICD-10-CM | POA: Diagnosis not present

## 2024-01-03 DIAGNOSIS — Z659 Problem related to unspecified psychosocial circumstances: Secondary | ICD-10-CM | POA: Diagnosis not present
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
## 2024-01-03 NOTE — Consult Note (Signed)
 Did attempt to complete psychiatric evaluation requested for placement today, however the patient is uncooperative at this time.  States that he does not want to speak with a psychiatric provider because he has had negative experiences in the past.  We will reattempt in a day or 2.

## 2024-01-04 DIAGNOSIS — R4689 Other symptoms and signs involving appearance and behavior: Secondary | ICD-10-CM | POA: Diagnosis not present

## 2024-01-04 DIAGNOSIS — R4589 Other symptoms and signs involving emotional state: Secondary | ICD-10-CM | POA: Diagnosis not present

## 2024-01-04 DIAGNOSIS — Z659 Problem related to unspecified psychosocial circumstances: Secondary | ICD-10-CM | POA: Diagnosis not present
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
## 2024-01-05 DIAGNOSIS — Z659 Problem related to unspecified psychosocial circumstances: Secondary | ICD-10-CM | POA: Diagnosis not present

## 2024-01-05 DIAGNOSIS — R4689 Other symptoms and signs involving appearance and behavior: Secondary | ICD-10-CM | POA: Diagnosis not present

## 2024-01-05 DIAGNOSIS — R4589 Other symptoms and signs involving emotional state: Secondary | ICD-10-CM | POA: Diagnosis not present
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
## 2024-01-05 NOTE — Plan of Care (Signed)
   Problem: Safety: Goal: Ability to remain free from injury will improve Outcome: Progressing

## 2024-01-06 DIAGNOSIS — R4689 Other symptoms and signs involving appearance and behavior: Secondary | ICD-10-CM | POA: Diagnosis not present

## 2024-01-06 DIAGNOSIS — R451 Restlessness and agitation: Secondary | ICD-10-CM | POA: Diagnosis not present

## 2024-01-06 DIAGNOSIS — Z789 Other specified health status: Secondary | ICD-10-CM | POA: Diagnosis not present

## 2024-01-06 DIAGNOSIS — D500 Iron deficiency anemia secondary to blood loss (chronic): Secondary | ICD-10-CM

## 2024-01-06 DIAGNOSIS — H548 Legal blindness, as defined in USA: Secondary | ICD-10-CM | POA: Diagnosis not present

## 2024-01-06 DIAGNOSIS — E039 Hypothyroidism, unspecified: Secondary | ICD-10-CM
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
## 2024-01-08 DIAGNOSIS — R4589 Other symptoms and signs involving emotional state: Secondary | ICD-10-CM | POA: Diagnosis not present

## 2024-01-08 DIAGNOSIS — Z659 Problem related to unspecified psychosocial circumstances: Secondary | ICD-10-CM | POA: Diagnosis not present

## 2024-01-08 DIAGNOSIS — R4689 Other symptoms and signs involving appearance and behavior: Secondary | ICD-10-CM | POA: Diagnosis not present

## 2024-01-08 NOTE — Plan of Care (Signed)
   Problem: Safety: Goal: Ability to remain free from injury will improve Outcome: Progressing
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
## 2024-01-09 DIAGNOSIS — R4589 Other symptoms and signs involving emotional state: Secondary | ICD-10-CM | POA: Diagnosis not present

## 2024-01-09 DIAGNOSIS — R4689 Other symptoms and signs involving appearance and behavior: Secondary | ICD-10-CM | POA: Diagnosis not present

## 2024-01-09 DIAGNOSIS — Z659 Problem related to unspecified psychosocial circumstances: Secondary | ICD-10-CM | POA: Diagnosis not present

## 2024-01-09 NOTE — Plan of Care (Signed)
   Problem: Safety: Goal: Ability to remain free from injury will improve Outcome: Progressing
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
## 2024-01-10 DIAGNOSIS — R4689 Other symptoms and signs involving appearance and behavior: Secondary | ICD-10-CM | POA: Diagnosis not present

## 2024-01-10 DIAGNOSIS — R4589 Other symptoms and signs involving emotional state: Secondary | ICD-10-CM | POA: Diagnosis not present

## 2024-01-10 DIAGNOSIS — Z659 Problem related to unspecified psychosocial circumstances: Secondary | ICD-10-CM | POA: Diagnosis not present
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
## 2024-01-10 NOTE — TOC Progression Note (Signed)
 Transition of Care [facility]) - Progression Note    Patient Details  Name: Martin [name] MRN: [id number] Date of Birth: [birth year]  Transition of Care [facility]) CM/SW Contact  Marlowe Sax, RN Phone Number: 01/10/2024, 10:41 AM  Clinical Narrative:     Sent secure email to [facility] supervisor at DSS asking for update on guardian hearing  Expected Discharge Plan:  (TBD) Barriers to Discharge: Continued Medical Work up  Expected Discharge Plan and Services                                               Social Determinants of Health (SDOH) Interventions SDOH Screenings   Food Insecurity: Patient Declined (08/29/2023)  Housing: Patient Declined (08/29/2023)  Transportation Needs: Patient Declined (08/29/2023)  Utilities: Patient Declined (08/29/2023)  Social Connections: Unknown (10/10/2023)  Tobacco Use: Low Risk  (04/29/2023)    Readmission Risk Interventions     No data to display

## 2024-01-11 DIAGNOSIS — R4689 Other symptoms and signs involving appearance and behavior: Secondary | ICD-10-CM | POA: Diagnosis not present

## 2024-01-11 DIAGNOSIS — Z659 Problem related to unspecified psychosocial circumstances: Secondary | ICD-10-CM | POA: Diagnosis not present

## 2024-01-11 DIAGNOSIS — R4589 Other symptoms and signs involving emotional state: Secondary | ICD-10-CM | POA: Diagnosis not present

## 2024-01-11 NOTE — TOC Progression Note (Signed)
 Transition of Care [facility]) - Progression Note    Patient Details  Name: Martin [name] MRN: [id number] Date of Birth: 31-Dec-1955  [facility] CM/SW Contact  Marlowe Sax, RN Phone Number: 01/11/2024, 1:36 PM  Clinical Narrative:     Patient has refused to speak with Psychiatry, court hearing Pending for Legal guardianship thru DSS 4/7   Expected Discharge Plan:  (TBD) Barriers to Discharge: Continued Medical Work up  Expected Discharge Plan and Services                                               Social Determinants of Health (SDOH) Interventions SDOH Screenings   Food Insecurity: Patient Declined (08/29/2023)  Housing: Patient Declined (08/29/2023)  Transportation Needs: Patient Declined (08/29/2023)  Utilities: Patient Declined (08/29/2023)  Social Connections: Unknown (10/10/2023)  Tobacco Use: Low Risk  (04/29/2023)    Readmission Risk Interventions     No data to display
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
## 2024-01-12 DIAGNOSIS — R4689 Other symptoms and signs involving appearance and behavior: Secondary | ICD-10-CM | POA: Diagnosis not present

## 2024-01-12 DIAGNOSIS — R4589 Other symptoms and signs involving emotional state: Secondary | ICD-10-CM | POA: Diagnosis not present

## 2024-01-12 DIAGNOSIS — Z659 Problem related to unspecified psychosocial circumstances: Secondary | ICD-10-CM | POA: Diagnosis not present

## 2024-01-12 NOTE — Plan of Care (Signed)
   Problem: Safety: Goal: Ability to remain free from injury will improve Outcome: Progressing
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
## 2024-01-13 DIAGNOSIS — R4689 Other symptoms and signs involving appearance and behavior: Secondary | ICD-10-CM | POA: Diagnosis not present

## 2024-01-13 DIAGNOSIS — Z659 Problem related to unspecified psychosocial circumstances: Secondary | ICD-10-CM | POA: Diagnosis not present

## 2024-01-13 DIAGNOSIS — R4589 Other symptoms and signs involving emotional state: Secondary | ICD-10-CM | POA: Diagnosis not present
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
## 2024-01-14 DIAGNOSIS — R451 Restlessness and agitation: Secondary | ICD-10-CM | POA: Diagnosis not present

## 2024-01-14 DIAGNOSIS — R4689 Other symptoms and signs involving appearance and behavior: Secondary | ICD-10-CM | POA: Diagnosis not present

## 2024-01-14 DIAGNOSIS — E039 Hypothyroidism, unspecified: Secondary | ICD-10-CM | POA: Diagnosis not present

## 2024-01-14 DIAGNOSIS — H9193 Unspecified hearing loss, bilateral: Secondary | ICD-10-CM | POA: Diagnosis not present
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
## 2024-01-15 DIAGNOSIS — E039 Hypothyroidism, unspecified: Secondary | ICD-10-CM | POA: Diagnosis not present

## 2024-01-15 DIAGNOSIS — R4689 Other symptoms and signs involving appearance and behavior: Secondary | ICD-10-CM | POA: Diagnosis not present

## 2024-01-15 DIAGNOSIS — R451 Restlessness and agitation: Secondary | ICD-10-CM | POA: Diagnosis not present

## 2024-01-15 DIAGNOSIS — H9193 Unspecified hearing loss, bilateral: Secondary | ICD-10-CM | POA: Diagnosis not present

## 2024-01-15 NOTE — TOC Progression Note (Addendum)
 Transition of Care [facility]) - Progression Note    Patient Details  Name: Martin [name] MRN: [id number] Date of Birth: 04-07-1956  [facility] CM/SW Contact  Marlowe Sax, RN Phone Number: 01/15/2024, 1:07 PM  Clinical Narrative:     Met with the patient and the interpreter in the room, I notified him that he has Court hearing at 3 today and DSS SW will come around 230 to explain it to him, I did email Kailee Marrow at DSS and notify her that he will need an interpreter for the hearing The patient was upset and stated that he has rights and that no court can tell him he can't take care of himself, I explained that he has been refusing medical care for months and that could show that he is not able to make decisions about his medical care, I explained he would have an opportunity to explain to DSS and the the Oral his side of it,ibuprofen explained that DSS is coming at 230 to explain it to him, I encouraged him to remain calm and do not refuse to talk to them like he has been doing, I explained by refusing it would not likely be productive in showing he can care for himself., He remained upset and Angry , He kept saying his dad works in [employer] and stated that noone can do this to him, I encouraged him to have his dad come to the hospital for support during the hearing, he stated that he would not ask him that what he and his dad discuss is between them and noone else, The patient did not wish to speak to me any further, I left the room and asked the interpreter to have one here at 230 to support his communication I spoke with [name] the interpreter and he stated that DSS should supply a Interpreter or the court system should he will call me back if he will be here for the patient otherwise DSS or the court will have to supply the interpreter  Update, Court postponed until court can request an interpreter to be present for [name], I sent secure email to Milinda Pointer at [facility] and Laurin Coder at  interpreter services stating that interpreter can be available Wed at 1030 AM to assist in  answering any questions the patient has  Expected Discharge Plan:  (TBD) Barriers to Discharge: Continued Medical Work up  Expected Discharge Plan and Services                                               Social Determinants of Health (SDOH) Interventions SDOH Screenings   Food Insecurity: Patient Declined (08/29/2023)  Housing: Patient Declined (08/29/2023)  Transportation Needs: Patient Declined (08/29/2023)  Utilities: Patient Declined (08/29/2023)  Social Connections: Unknown (10/10/2023)  Tobacco Use: Low Risk  (04/29/2023)    Readmission Risk Interventions     No data to display
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
## 2024-01-16 DIAGNOSIS — Z659 Problem related to unspecified psychosocial circumstances: Secondary | ICD-10-CM | POA: Diagnosis not present

## 2024-01-16 DIAGNOSIS — R4689 Other symptoms and signs involving appearance and behavior: Secondary | ICD-10-CM | POA: Diagnosis not present

## 2024-01-16 DIAGNOSIS — R4589 Other symptoms and signs involving emotional state: Secondary | ICD-10-CM | POA: Diagnosis not present

## 2024-01-16 NOTE — TOC Progression Note (Signed)
 Transition of Care [facility]) - Progression Note    Patient Details  Name: Martin [name] MRN: [id number] Date of Birth: 1956/09/18  [facility] CM/SW Contact  Marlowe Sax, RN Phone Number: 01/16/2024, 8:34 AM  Clinical Narrative:     Received a message from DSS [facility] [place] " We were able to proceed with the hearing today and [facility] DSS was appointed the Guardian of Mr. Leatham. I have attached Sander Radon and Geraldine Solar to this email for point of contact. If Herbert Seta or Gabriel Rung are unable to make the planned interpreter visit, we will be more than happy to assist.   Heather/Joe - if you are unable to be there Wednesday at 10:30am - please let me know what information you would like to be asked and I will have a SW there to assist.   Thanks!  -Milinda Pointer "  The patient now has a legal guardian   I received a phone call from [employer] late yesterday asking for my personal number for after I am no longer working at [employer] Starting next week, I explained to him that once I am no longer an employee of [employer] that I will not be involved with this patient any longer and that it would be a HIPAA violation, he stated understanding and stated that he is going to report this occurrence to ADA.  I explained that he could do what he feels but that I will not be involved  Expected Discharge Plan:  (TBD) Barriers to Discharge: Continued Medical Work up  Expected Discharge Plan and Services                                               Social Determinants of Health (SDOH) Interventions SDOH Screenings   Food Insecurity: Patient Declined (08/29/2023)  Housing: Patient Declined (08/29/2023)  Transportation Needs: Patient Declined (08/29/2023)  Utilities: Patient Declined (08/29/2023)  Social Connections: Unknown (10/10/2023)  Tobacco Use: Low Risk  (04/29/2023)    Readmission Risk Interventions     No data to display
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
## 2024-01-17 DIAGNOSIS — R4589 Other symptoms and signs involving emotional state: Secondary | ICD-10-CM | POA: Diagnosis not present

## 2024-01-17 DIAGNOSIS — R4689 Other symptoms and signs involving appearance and behavior: Secondary | ICD-10-CM | POA: Diagnosis not present

## 2024-01-17 DIAGNOSIS — Z659 Problem related to unspecified psychosocial circumstances: Secondary | ICD-10-CM | POA: Diagnosis not present
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
## 2024-01-18 DIAGNOSIS — Z659 Problem related to unspecified psychosocial circumstances: Secondary | ICD-10-CM | POA: Diagnosis not present

## 2024-01-18 DIAGNOSIS — R4589 Other symptoms and signs involving emotional state: Secondary | ICD-10-CM | POA: Diagnosis not present

## 2024-01-18 DIAGNOSIS — R4689 Other symptoms and signs involving appearance and behavior: Secondary | ICD-10-CM | POA: Diagnosis not present
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
## 2024-01-18 NOTE — Progress Notes (Signed)
 Referral sent to [facility], as they specialize in services for deaf and blind.

## 2024-01-19 DIAGNOSIS — R4589 Other symptoms and signs involving emotional state: Secondary | ICD-10-CM | POA: Diagnosis not present

## 2024-01-19 DIAGNOSIS — Z659 Problem related to unspecified psychosocial circumstances: Secondary | ICD-10-CM | POA: Diagnosis not present

## 2024-01-19 DIAGNOSIS — R4689 Other symptoms and signs involving appearance and behavior: Secondary | ICD-10-CM | POA: Diagnosis not present

## 2024-01-19 NOTE — TOC Progression Note (Addendum)
 Transition of Care [facility]) - Progression Note    Patient Details  Name: Martin [name] MRN: [id number] Date of Birth: 07-13-1956  Transition of Care [facility]) CM/SW Contact  Marlowe Sax, RN Phone Number: 01/19/2024, 12:42 PM  Clinical Narrative:    [facility]  [address] [place], [place] [address] Has accepted the patient and will need updated PASSR sent to  Jeri Lager  admission director   Jeri Lager [facility]  [facility]  Social Worker Direct Dial: [phone]  Fax: [phone] Website: [email or web address]   [facility] DSS legal guardian is Joe Suggs [employer]ine [employer], [facility] Social Worker  Social Services [facility] [address] [place], [place] [address] [phone]  m: [phone]  f: [phone]  [email or web address]  [email or web address]      [phone] To obtain interpreter services   Requesting Services more than 48 hours in advance   Use the request form linked here to submit your request.  Once submitted, an auto generated email "receipt" will be sent to the requestor. Email [email or web address] with your request Call [facility] [phone] ext [phone]) during regular business hours Mon-Fri 9am - 5pm    Requesting services in less than 48 hours  Call [facility] [phone] ext [phone]) during regular business hours Mon-Fri 9am - 5pm.   IF you do not reach a live person, use extension 1 to page an on-call staff member.  Please leave the date/time/location of the appointment after the recording on extension 1.  Our on-call staff member will call you back within 10 minutes of receiving your request.           Aileen Pilot, MPA  Executive Museum/gallery exhibitions officer for the [employer]  [address]  [address]  [place], [place] [address]  [phone] Voice  [phone] VP  [phone] Fax  Expected Discharge Plan:   (TBD) Barriers to Discharge: Continued Medical Work up  Expected Discharge Plan and Services                                               Social Determinants of Health (SDOH) Interventions SDOH Screenings   Food Insecurity: Patient Declined (08/29/2023)  Housing: Patient Declined (08/29/2023)  Transportation Needs: Patient Declined (08/29/2023)  Utilities: Patient Declined (08/29/2023)  Social Connections: Unknown (10/10/2023)  Tobacco Use: Low Risk  (04/29/2023)    Readmission Risk Interventions     No data to display
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
## 2024-01-19 NOTE — TOC Progression Note (Signed)
 Transition of Care [facility]) - Progression Note    Patient Details  Name: Martin [name] MRN: [id number] Date of Birth: 08/24/1956  Transition of Care [facility]) CM/SW Contact  Marlowe Sax, RN Phone Number: 01/19/2024, 2:36 PM  Clinical Narrative:    Attempted to update PASSR 6 times, PASSR [place] MUST is not operating appropriately and error says to trya agin later, will continue to try to update PASSR    Expected Discharge Plan:  (TBD) Barriers to Discharge: Continued Medical Work up  Expected Discharge Plan and Services                                               Social Determinants of Health (SDOH) Interventions SDOH Screenings   Food Insecurity: Patient Declined (08/29/2023)  Housing: Patient Declined (08/29/2023)  Transportation Needs: Patient Declined (08/29/2023)  Utilities: Patient Declined (08/29/2023)  Social Connections: Unknown (10/10/2023)  Tobacco Use: Low Risk  (04/29/2023)    Readmission Risk Interventions     No data to display

## 2024-01-20 DIAGNOSIS — R4589 Other symptoms and signs involving emotional state: Secondary | ICD-10-CM | POA: Diagnosis not present

## 2024-01-20 DIAGNOSIS — R4689 Other symptoms and signs involving appearance and behavior: Secondary | ICD-10-CM | POA: Diagnosis not present

## 2024-01-20 DIAGNOSIS — Z659 Problem related to unspecified psychosocial circumstances: Secondary | ICD-10-CM | POA: Diagnosis not present
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
## 2024-01-21 DIAGNOSIS — R4689 Other symptoms and signs involving appearance and behavior: Secondary | ICD-10-CM | POA: Diagnosis not present

## 2024-01-21 DIAGNOSIS — R4589 Other symptoms and signs involving emotional state: Secondary | ICD-10-CM | POA: Diagnosis not present

## 2024-01-21 DIAGNOSIS — Z659 Problem related to unspecified psychosocial circumstances: Secondary | ICD-10-CM | POA: Diagnosis not present

## 2024-01-21 NOTE — Plan of Care (Signed)
   Problem: Safety: Goal: Ability to remain free from injury will improve Outcome: Progressing
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
## 2024-01-22 DIAGNOSIS — R4689 Other symptoms and signs involving appearance and behavior: Secondary | ICD-10-CM | POA: Diagnosis not present

## 2024-01-22 DIAGNOSIS — R4589 Other symptoms and signs involving emotional state: Secondary | ICD-10-CM | POA: Diagnosis not present

## 2024-01-22 DIAGNOSIS — Z659 Problem related to unspecified psychosocial circumstances: Secondary | ICD-10-CM | POA: Diagnosis not present
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
## 2024-01-22 NOTE — Plan of Care (Signed)
   Problem: Safety: Goal: Ability to remain free from injury will improve Outcome: Progressing

## 2024-01-22 NOTE — TOC Progression Note (Signed)
 Transition of Care [facility]) - Progression Note    Patient Details  Name: Martin [name] MRN: [id number] Date of Birth: 05-Jan-1956  Transition of Care [facility]) CM/SW Contact  Abagail Hoar, RN Phone Number: 01/22/2024, 2:01 PM  Clinical Narrative:     Spoke with Admissions Coordinator at [facility],m I let her know that the PASSR is pending, she is working with Kathaleen Pale at [facility] DSS to get the needed paperwork completed, will send PASSR information once obtained  Expected Discharge Plan:  (TBD) Barriers to Discharge: Continued Medical Work up  Expected Discharge Plan and Services                                               Social Determinants of Health (SDOH) Interventions SDOH Screenings   Food Insecurity: Patient Declined (08/29/2023)  Housing: Patient Declined (08/29/2023)  Transportation Needs: Patient Declined (08/29/2023)  Utilities: Patient Declined (08/29/2023)  Social Connections: Unknown (10/10/2023)  Tobacco Use: Low Risk  (04/29/2023)    Readmission Risk Interventions     No data to display

## 2024-01-22 NOTE — TOC Progression Note (Signed)
 Transition of Care [facility] - Progression Note    Patient Details  Name: Martin [name] MRN: [id number] Date of Birth: 1956-10-04  Transition of Care [facility]) CM/SW Contact  Abagail Hoar, RN Phone Number: 01/22/2024, 12:06 PM  Clinical Narrative:    Submitted clinical documents to [place] MUST to obtain PASSR to go to [facility]   Expected Discharge Plan:  (TBD) Barriers to Discharge: Continued Medical Work up  Expected Discharge Plan and Services                                               Social Determinants of Health (SDOH) Interventions SDOH Screenings   Food Insecurity: Patient Declined (08/29/2023)  Housing: Patient Declined (08/29/2023)  Transportation Needs: Patient Declined (08/29/2023)  Utilities: Patient Declined (08/29/2023)  Social Connections: Unknown (10/10/2023)  Tobacco Use: Low Risk  (04/29/2023)    Readmission Risk Interventions     No data to display

## 2024-01-22 NOTE — NC FL2 (Signed)
 [place] MEDICAID FL2 LEVEL OF CARE FORM     IDENTIFICATION  Patient Name: Martin [name] Birthdate: June 01, 1956 Sex: male Admission Date (Current Location): 03/30/2023  [place] and [place] Number:  Martin [name] [id number] Facility and Address:  [facility], [address], [place], [place] [address]      Provider Number: [id number]  Attending Physician Name and Address:  Enedina Finner, MD  Relative Name and Phone Number:  Geraldine Solar Legal guardian  [phone]    Current Level of Care: Hospital Recommended Level of Care: Nursing Facility Prior Approval Number:    Date Approved/Denied:   PASRR Number: pending  Discharge Plan: Other (Comment) (nursing facility)    Current Diagnoses: Patient Active Problem List   Diagnosis Date Noted   Nail dystrophy 12/23/2023   Behavioural, emotional, and social difficulties (BESD) 12/08/2023   Agitation 11/29/2023   Colostomy prolapse (HCC) 11/29/2023   Patient incapable of making informed decisions 10/25/2023   Protein-calorie malnutrition, severe 08/25/2023   Toenail fungus 08/08/2023   Epididymitis, bilateral 08/02/2023   Adjustment disorder 08/02/2023   Hypotension 08/01/2023   Impaired decision making 07/31/2023   Evaluation by psychiatric service required 07/29/2023   Iron deficiency anemia 07/27/2023   Colostomy status (HCC) 06/24/2023   Ogilvie's syndrome    Thrombocytopenia (HCC)    CKD (chronic kidney disease)    Septic joint of right knee joint (HCC) 12/21/2015   Chronic pain syndrome 12/21/2015   Legally blind 12/21/2015   Deaf 12/21/2015   Hypothyroidism, unspecified 12/21/2015   Chronic constipation     Orientation RESPIRATION BLADDER Height & Weight     Self, Time, Situation, Place  Normal Continent Weight: 60 kg Height:     BEHAVIORAL SYMPTOMS/MOOD NEUROLOGICAL BOWEL NUTRITION STATUS  Other (Comment) (has to be approched slowley due to deaf and blind, lightly touch on right shoulder to  alert him of your presence, need sign ;language interpreter)   Colostomy Diet  AMBULATORY STATUS COMMUNICATION OF NEEDS Skin   Total Care (non ambulatory) Non-Verbally Normal                       Personal Care Assistance Level of Assistance  Bathing, Feeding, Dressing Bathing Assistance: Limited assistance Feeding assistance: Limited assistance Dressing Assistance: Limited assistance     Functional Limitations Info  Sight, Hearing, Speech Sight Info: Impaired (limited sight in left eye, blind in right) Hearing Info: Impaired (deaf) Speech Info: Impaired (non verbal)    SPECIAL CARE FACTORS FREQUENCY  PT (By licensed PT), OT (By licensed OT) (colosotmy care)     PT Frequency: n/A OT Frequency: n/A            Contractures Contractures Info: Not present    Additional Factors Info  Code Status, Allergies Code Status Info: Full code Allergies Info: IV DYe Iodinated COntrast Media           Current Medications (01/22/2024):  This is the current hospital active medication list Current Facility-Administered Medications  Medication Dose Route Frequency Provider Last Rate Last Admin   acetaminophen (TYLENOL) tablet 650 mg  650 mg Oral Q6H PRN Deeann Saint, MD   650 mg at 01/10/24 1306   Or   acetaminophen (TYLENOL) suppository 650 mg  650 mg Rectal Q6H PRN Deeann Saint, MD       alum & mag hydroxide-simeth (MAALOX/MYLANTA) 200-200-20 MG/5ML suspension 30 mL  30 mL Oral Q6H PRN Deeann Saint, MD   30 mL at 12/09/23 1245   camphor-menthol (  SARNA) lotion   Topical PRN Verla Glaze, MD   Given at 09/24/23 0215   diclofenac Sodium (VOLTAREN) 1 % topical gel 4 g  4 g Topical QID Sreenath, Sudheer B, MD   4 g at 01/20/24 2219   diphenhydrAMINE (BENADRYL) capsule 50 mg  50 mg Oral Q6H PRN Shah, Vipul, MD   50 mg at 09/13/23 2353   dorzolamide (TRUSOPT) 2 % ophthalmic solution 1 drop  1 drop Both Eyes BID Augie Leaven, RPH   1 drop at 01/22/24 1029   hydrocerin  (EUCERIN) cream   Topical QPC lunch Althia Atlas, MD   Given at 01/01/24 1337   ibuprofen (ADVIL) tablet 400 mg  400 mg Oral Q6H PRN Sheril Dines, MD       latanoprost (XALATAN) 0.005 % ophthalmic solution 1 drop  1 drop Both Eyes QHS Marlynn Singer, MD   1 drop at 01/21/24 2312   levothyroxine (SYNTHROID) tablet 125 mcg  125 mcg Oral Q24H Hunt, Madison H, RPH   125 mcg at 01/13/24 1047   magnesium hydroxide (MILK OF MAGNESIA) suspension 30 mL  30 mL Oral Daily PRN Brenna Cam, MD   30 mL at 01/10/24 1306   menthol-cetylpyridinium (CEPACOL) lozenge 3 mg  1 lozenge Oral PRN Lorita Rosa, MD       miconazole (MICOTIN) 2 % cream   Topical BID PRN Lorita Rosa, MD   Given at 08/24/23 0901   multivitamin liquid 15 mL  15 mL Oral Daily Sreenath, Sudheer B, MD   15 mL at 01/13/24 1047   OLANZapine zydis (ZYPREXA) disintegrating tablet 5 mg  5 mg Oral BID PRN Marlynn Singer, MD       ondansetron (ZOFRAN) tablet 4 mg  4 mg Oral Q6H PRN Marlynn Singer, MD       Oral care mouth rinse  15 mL Mouth Rinse PRN Sreeram, Narendranath, MD       oxyCODONE (Oxy IR/ROXICODONE) immediate release tablet 5 mg  5 mg Oral Q6H PRN Sheril Dines, MD   5 mg at 01/05/24 1312   polyethylene glycol (MIRALAX / GLYCOLAX) packet 17 g  17 g Oral Daily PRN Zhang, Dekui, MD       polyvinyl alcohol (LIQUIFILM TEARS) 1.4 % ophthalmic solution 2 drop  2 drop Both Eyes PRN Marlynn Singer, MD   2 drop at 07/17/23 1436   simethicone (MYLICON) chewable tablet 80 mg  80 mg Oral QID PRN Sreenath, Sudheer B, MD       traZODone (DESYREL) tablet 50 mg  50 mg Oral QHS PRN Marlynn Singer, MD   50 mg at 04/19/23 0028   trolamine salicylate (ASPERCREME) 10 % cream   Topical BID PRN Marlynn Singer, MD   Given at 06/09/23 1157   white petrolatum (VASELINE) gel   Topical PRN Zhang, Dekui, MD         Discharge Medications: Please see discharge summary for a list of discharge medications.  Relevant Imaging Results:  Relevant Lab  Results:   Additional Information SSN [id number]  Abagail Hoar, RN

## 2024-01-23 DIAGNOSIS — R4689 Other symptoms and signs involving appearance and behavior: Secondary | ICD-10-CM | POA: Diagnosis not present

## 2024-01-23 DIAGNOSIS — R4589 Other symptoms and signs involving emotional state: Secondary | ICD-10-CM | POA: Diagnosis not present

## 2024-01-23 DIAGNOSIS — Z659 Problem related to unspecified psychosocial circumstances: Secondary | ICD-10-CM | POA: Diagnosis not present

## 2024-01-23 NOTE — Plan of Care (Signed)
   Problem: Safety: Goal: Ability to remain free from injury will improve Outcome: Progressing

## 2024-01-23 NOTE — TOC Progression Note (Signed)
 Transition of Care [facility]) - Progression Note    Patient Details  Name: Martin [name] MRN: [id number] Date of Birth: Jan 09, 1956  Transition of Care [facility]) CM/SW Contact  Abagail Hoar, RN Phone Number: 01/23/2024, 12:38 PM  Clinical Narrative:     PASSR still pending  Expected Discharge Plan:  (TBD) Barriers to Discharge: Continued Medical Work up  Expected Discharge Plan and Services                                               Social Determinants of Health (SDOH) Interventions SDOH Screenings   Food Insecurity: Patient Declined (08/29/2023)  Housing: Patient Declined (08/29/2023)  Transportation Needs: Patient Declined (08/29/2023)  Utilities: Patient Declined (08/29/2023)  Social Connections: Unknown (10/10/2023)  Tobacco Use: Low Risk  (04/29/2023)    Readmission Risk Interventions     No data to display
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
## 2024-01-24 DIAGNOSIS — R4589 Other symptoms and signs involving emotional state: Secondary | ICD-10-CM | POA: Diagnosis not present

## 2024-01-24 DIAGNOSIS — R4689 Other symptoms and signs involving appearance and behavior: Secondary | ICD-10-CM | POA: Diagnosis not present

## 2024-01-24 DIAGNOSIS — Z659 Problem related to unspecified psychosocial circumstances: Secondary | ICD-10-CM | POA: Diagnosis not present

## 2024-01-24 NOTE — TOC Progression Note (Signed)
 Transition of Care [facility]) - Progression Note    Patient Details  Name: Martin [name] MRN: [id number] Date of Birth: 1956/06/09  Transition of Care [facility]) CM/SW Contact  Alexandra Ice, RN Phone Number: 01/24/2024, 4:41 PM  Clinical Narrative:    Received call from PASRR representative regarding visiting patient for PASRR screening. He asked how long patient has been deaf and blind. 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 Stated he can come during morning and we can call for interpretater services if they are not in the room with patient.    Expected Discharge Plan:  (TBD) Barriers to Discharge: Continued Medical Work up  Expected Discharge Plan and Services                                               Social Determinants of Health (SDOH) Interventions SDOH Screenings   Food Insecurity: Patient Declined (08/29/2023)  Housing: Patient Declined (08/29/2023)  Transportation Needs: Patient Declined (08/29/2023)  Utilities: Patient Declined (08/29/2023)  Social Connections: Unknown (10/10/2023)  Tobacco Use: Low Risk  (04/29/2023)    Readmission Risk Interventions     No data to display
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
## 2024-01-25 DIAGNOSIS — Z659 Problem related to unspecified psychosocial circumstances: Secondary | ICD-10-CM | POA: Diagnosis not present

## 2024-01-25 DIAGNOSIS — R4689 Other symptoms and signs involving appearance and behavior: Secondary | ICD-10-CM | POA: Diagnosis not present

## 2024-01-25 DIAGNOSIS — R4589 Other symptoms and signs involving emotional state: Secondary | ICD-10-CM | POA: Diagnosis not present
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
## 2024-01-26 DIAGNOSIS — R4589 Other symptoms and signs involving emotional state: Secondary | ICD-10-CM

## 2024-01-26 DIAGNOSIS — Z659 Problem related to unspecified psychosocial circumstances: Secondary | ICD-10-CM

## 2024-01-26 DIAGNOSIS — R4689 Other symptoms and signs involving appearance and behavior: Secondary | ICD-10-CM

## 2024-01-26 NOTE — Progress Notes (Signed)
 Per onsite interpreter patient agreed to take his medications after he ate lunch. Nurse attempted X2 to get the patient to take his medications however, patient would not acknowledge the nurse in the room.

## 2024-01-26 NOTE — Plan of Care (Signed)
   Problem: Safety: Goal: Ability to remain free from injury will improve Outcome: Progressing
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
## 2024-01-26 NOTE — TOC Progression Note (Signed)
 Transition of Care [facility]) - Progression Note    Patient Details  Name: Martin [name] MRN: [id number] Date of Birth: [birth year]  Transition of Care [facility]) CM/SW Contact  Quintella Suzen Jansky, RN Phone Number: 01/26/2024, 2:43 PM  Clinical Narrative:     Patient had PASRR Level II evaluation 01/25/2024, he is now pending results of evaluation.   Expected Discharge Plan:  (TBD) Barriers to Discharge: Continued Medical Work up  Expected Discharge Plan and Services                                               Social Determinants of Health (SDOH) Interventions SDOH Screenings   Food Insecurity: Patient Declined (08/29/2023)  Housing: Patient Declined (08/29/2023)  Transportation Needs: Patient Declined (08/29/2023)  Utilities: Patient Declined (08/29/2023)  Social Connections: Unknown (10/10/2023)  Tobacco Use: Low Risk  (04/29/2023)    Readmission Risk Interventions     No data to display

## 2024-01-27 DIAGNOSIS — Z659 Problem related to unspecified psychosocial circumstances: Secondary | ICD-10-CM | POA: Diagnosis not present

## 2024-01-27 DIAGNOSIS — R4589 Other symptoms and signs involving emotional state: Secondary | ICD-10-CM | POA: Diagnosis not present

## 2024-01-27 DIAGNOSIS — R4689 Other symptoms and signs involving appearance and behavior: Secondary | ICD-10-CM | POA: Diagnosis not present
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
## 2024-01-27 NOTE — Plan of Care (Signed)
   Problem: Safety: Goal: Ability to remain free from injury will improve Outcome: Progressing

## 2024-01-28 DIAGNOSIS — Z659 Problem related to unspecified psychosocial circumstances: Secondary | ICD-10-CM | POA: Diagnosis not present

## 2024-01-28 DIAGNOSIS — R4689 Other symptoms and signs involving appearance and behavior: Secondary | ICD-10-CM | POA: Diagnosis not present

## 2024-01-28 DIAGNOSIS — R4589 Other symptoms and signs involving emotional state: Secondary | ICD-10-CM | POA: Diagnosis not present
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
## 2024-01-29 DIAGNOSIS — R4689 Other symptoms and signs involving appearance and behavior: Secondary | ICD-10-CM | POA: Diagnosis not present

## 2024-01-29 DIAGNOSIS — Z659 Problem related to unspecified psychosocial circumstances: Secondary | ICD-10-CM | POA: Diagnosis not present

## 2024-01-29 DIAGNOSIS — R4589 Other symptoms and signs involving emotional state: Secondary | ICD-10-CM | POA: Diagnosis not present

## 2024-01-29 NOTE — Progress Notes (Signed)
 Interpreter was at bedside. Patient refused medications at this time including eye drops stating that he "wishes to go to sleep". Will attempt to give medications later today.
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
## 2024-01-29 NOTE — Plan of Care (Signed)
   Problem: Safety: Goal: Ability to remain free from injury will improve Outcome: Progressing

## 2024-01-30 DIAGNOSIS — R4689 Other symptoms and signs involving appearance and behavior: Secondary | ICD-10-CM | POA: Diagnosis not present

## 2024-01-30 DIAGNOSIS — Z659 Problem related to unspecified psychosocial circumstances: Secondary | ICD-10-CM | POA: Diagnosis not present

## 2024-01-30 DIAGNOSIS — R4589 Other symptoms and signs involving emotional state: Secondary | ICD-10-CM | POA: Diagnosis not present
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
## 2024-01-30 NOTE — TOC Progression Note (Signed)
 Transition of Care [facility]) - Progression Note    Patient Details  Name: Martin [name] MRN: [id number] Date of Birth: 1956/06/12  [facility]) CM/SW Contact  Alexandra Ice, RN Phone Number: 01/30/2024, 4:33 PM  Clinical Narrative:    Received message from Level II PASRR evaluator that evaluation was cancelled due to interpreter was not available when he was here and will need to resubmit to reschedule Level II eval.    Expected Discharge Plan:  (TBD) Barriers to Discharge: Continued Medical Work up  Expected Discharge Plan and Services                                               Social Determinants of Health (SDOH) Interventions SDOH Screenings   Food Insecurity: Patient Declined (08/29/2023)  Housing: Patient Declined (08/29/2023)  Transportation Needs: Patient Declined (08/29/2023)  Utilities: Patient Declined (08/29/2023)  Social Connections: Unknown (10/10/2023)  Tobacco Use: Low Risk  (04/29/2023)    Readmission Risk Interventions     No data to display

## 2024-01-31 DIAGNOSIS — Z659 Problem related to unspecified psychosocial circumstances: Secondary | ICD-10-CM | POA: Diagnosis not present

## 2024-01-31 DIAGNOSIS — R4689 Other symptoms and signs involving appearance and behavior: Secondary | ICD-10-CM | POA: Diagnosis not present

## 2024-01-31 DIAGNOSIS — R4589 Other symptoms and signs involving emotional state: Secondary | ICD-10-CM | POA: Diagnosis not present

## 2024-01-31 NOTE — Plan of Care (Signed)
   Problem: Safety: Goal: Ability to remain free from injury will improve Outcome: Progressing
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
## 2024-01-31 NOTE — TOC Progression Note (Signed)
 Transition of Care [facility]) - Progression Note    Patient Details  Name: Martin [name] MRN: [id number] Date of Birth: 08/22/1956  Transition of Care [facility]) CM/SW Contact  Alexandra Ice, RN Phone Number: 01/31/2024, 4:25 PM  Clinical Narrative:     Met with interpreters at bedside, stated they had a meeting with person with [facility] to complete Level II evaluation. TOC CM stated was not aware there was a meeting set up and was inform it was cancelled and needs to be resubmitted, was notified of this yesterday. She stated she had to call him and he just informed her of this today when she called him and did show up.   Expected Discharge Plan:  (TBD) Barriers to Discharge: Continued Medical Work up  Expected Discharge Plan and Services                                               Social Determinants of Health (SDOH) Interventions SDOH Screenings   Food Insecurity: Patient Declined (08/29/2023)  Housing: Patient Declined (08/29/2023)  Transportation Needs: Patient Declined (08/29/2023)  Utilities: Patient Declined (08/29/2023)  Social Connections: Unknown (10/10/2023)  Tobacco Use: Low Risk  (04/29/2023)    Readmission Risk Interventions     No data to display

## 2024-02-01 DIAGNOSIS — Z659 Problem related to unspecified psychosocial circumstances: Secondary | ICD-10-CM | POA: Diagnosis not present

## 2024-02-01 DIAGNOSIS — R4689 Other symptoms and signs involving appearance and behavior: Secondary | ICD-10-CM | POA: Diagnosis not present

## 2024-02-01 DIAGNOSIS — R4589 Other symptoms and signs involving emotional state: Secondary | ICD-10-CM | POA: Diagnosis not present
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
## 2024-02-02 DIAGNOSIS — R4689 Other symptoms and signs involving appearance and behavior: Secondary | ICD-10-CM | POA: Diagnosis not present

## 2024-02-02 DIAGNOSIS — Z659 Problem related to unspecified psychosocial circumstances: Secondary | ICD-10-CM | POA: Diagnosis not present

## 2024-02-02 DIAGNOSIS — R4589 Other symptoms and signs involving emotional state: Secondary | ICD-10-CM | POA: Diagnosis not present

## 2024-02-02 NOTE — Plan of Care (Signed)
   Problem: Safety: Goal: Ability to remain free from injury will improve Outcome: Progressing

## 2024-02-02 NOTE — TOC Progression Note (Signed)
 Transition of Care [facility]) - Progression Note    Patient Details  Name: Martin [name] MRN: [id number] Date of Birth: 1956-01-30  Transition of Care [facility]) CM/SW Contact  Alexandra Ice, RN Phone Number: 02/02/2024, 4:43 PM  Clinical Narrative:    Received call from [name] level II evaluator, stated he will contact interpreter to scheduled time with them to see patient next week to complete evaluation for patient. He will let me know the time to ensure patient does not have any testing that day.    Expected Discharge Plan:  (TBD) Barriers to Discharge: Continued Medical Work up  Expected Discharge Plan and Services                                               Social Determinants of Health (SDOH) Interventions SDOH Screenings   Food Insecurity: Patient Declined (08/29/2023)  Housing: Patient Declined (08/29/2023)  Transportation Needs: Patient Declined (08/29/2023)  Utilities: Patient Declined (08/29/2023)  Social Connections: Unknown (10/10/2023)  Tobacco Use: Low Risk  (04/29/2023)    Readmission Risk Interventions     No data to display

## 2024-02-02 NOTE — Progress Notes (Signed)
 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 Nurse held up medications to patient's right eye to see if patient would agree to medication administration. Patient made hand gestures to indicate he wanted to eat first with. Nurse returned to patient's room when ASL interpreter arrived at approximately 1500. ASL interpreter informed nurse that patient expressed he was tired. Patient's head covered with sheet. ASL interpreter attempted to communicate with patient to ask if he will take medications but patient did not respond.
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
## 2024-02-02 NOTE — TOC Progression Note (Signed)
 Transition of Care [facility]) - Progression Note    Patient Details  Name: Rockwell Zentz MRN: [id number] Date of Birth: 10-27-1955  Transition of Care [facility]) CM/SW Contact  Alexandra Ice, RN Phone Number: 02/02/2024, 10:45 AM  Clinical Narrative:     Resubmitted PASRR screen, pending review.  Expected Discharge Plan:  (TBD) Barriers to Discharge: Continued Medical Work up  Expected Discharge Plan and Services                                               Social Determinants of Health (SDOH) Interventions SDOH Screenings   Food Insecurity: Patient Declined (08/29/2023)  Housing: Patient Declined (08/29/2023)  Transportation Needs: Patient Declined (08/29/2023)  Utilities: Patient Declined (08/29/2023)  Social Connections: Unknown (10/10/2023)  Tobacco Use: Low Risk  (04/29/2023)    Readmission Risk Interventions     No data to display

## 2024-02-03 DIAGNOSIS — Z659 Problem related to unspecified psychosocial circumstances: Secondary | ICD-10-CM | POA: Diagnosis not present

## 2024-02-03 DIAGNOSIS — R4589 Other symptoms and signs involving emotional state: Secondary | ICD-10-CM | POA: Diagnosis not present

## 2024-02-03 DIAGNOSIS — R4689 Other symptoms and signs involving appearance and behavior: Secondary | ICD-10-CM | POA: Diagnosis not present

## 2024-02-03 NOTE — Plan of Care (Signed)
   Problem: Safety: Goal: Ability to remain free from injury will improve Outcome: Progressing
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
## 2024-02-04 DIAGNOSIS — R4589 Other symptoms and signs involving emotional state: Secondary | ICD-10-CM | POA: Diagnosis not present

## 2024-02-04 DIAGNOSIS — R4689 Other symptoms and signs involving appearance and behavior: Secondary | ICD-10-CM | POA: Diagnosis not present

## 2024-02-04 DIAGNOSIS — Z659 Problem related to unspecified psychosocial circumstances: Secondary | ICD-10-CM | POA: Diagnosis not present
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
## 2024-02-04 NOTE — Plan of Care (Signed)
   Problem: Safety: Goal: Ability to remain free from injury will improve Outcome: Progressing

## 2024-02-05 DIAGNOSIS — R4589 Other symptoms and signs involving emotional state: Secondary | ICD-10-CM | POA: Diagnosis not present

## 2024-02-05 DIAGNOSIS — R4689 Other symptoms and signs involving appearance and behavior: Secondary | ICD-10-CM | POA: Diagnosis not present

## 2024-02-05 DIAGNOSIS — Z659 Problem related to unspecified psychosocial circumstances: Secondary | ICD-10-CM | POA: Diagnosis not present

## 2024-02-05 NOTE — Plan of Care (Signed)
   Problem: Safety: Goal: Ability to remain free from injury will improve Outcome: Progressing

## 2024-02-05 NOTE — TOC Progression Note (Addendum)
 Transition of Care [facility]) - Progression Note    Patient Details  Name: Martin [name] MRN: [id number] Date of Birth: September 28, 1956  Transition [facility] Care [facility]) CM/SW Contact  Alexandra Ice, RN Phone Number: 02/05/2024, 11:42 AM  Clinical Narrative:    Received call from [employer] level II evaluator, wanted to provide update that he tried to reach out to interpreter to schedule time this week, has not heard back. TOC sent message to Loetta Ringer to try to see her availability this week.   Received call from [facility] stated they are able to accept patient tomorrow. She explained they are a mental hospital and do not provide LTC. TOC left message with Kathaleen Pale, legal guardian, requesting call back to discuss. Provided contact number.    Expected Discharge Plan:  (TBD) Barriers to Discharge: Continued Medical Work up  Expected Discharge Plan and Services                                               Social Determinants of Health (SDOH) Interventions SDOH Screenings   Food Insecurity: Patient Declined (08/29/2023)  Housing: Patient Declined (08/29/2023)  Transportation Needs: Patient Declined (08/29/2023)  Utilities: Patient Declined (08/29/2023)  Social Connections: Unknown (10/10/2023)  Tobacco Use: Low Risk  (04/29/2023)    Readmission Risk Interventions     No data to display
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
## 2024-02-06 DIAGNOSIS — Z659 Problem related to unspecified psychosocial circumstances: Secondary | ICD-10-CM | POA: Diagnosis not present

## 2024-02-06 DIAGNOSIS — R4589 Other symptoms and signs involving emotional state: Secondary | ICD-10-CM | POA: Diagnosis not present

## 2024-02-06 DIAGNOSIS — R4689 Other symptoms and signs involving appearance and behavior: Secondary | ICD-10-CM | POA: Diagnosis not present

## 2024-02-06 MED ORDER — POLYETHYLENE GLYCOL 3350 17 G PO PACK
17.0000 g | PACK | Freq: Every day | ORAL | Status: DC
  Administered 2024-02-09 – 2024-02-11 (×3): 17 g via ORAL
  Filled 2024-02-06 (×2): qty 1

## 2024-02-06 MED ORDER — POLYETHYLENE GLYCOL 3350 17 G PO PACK
17.0000 g | PACK | ORAL | Status: AC
  Administered 2024-02-06: 17 g via ORAL
  Filled 2024-02-06 (×2): qty 1
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
## 2024-02-06 NOTE — Plan of Care (Signed)
   Problem: Safety: Goal: Ability to remain free from injury will improve Outcome: Progressing

## 2024-02-07 DIAGNOSIS — Z659 Problem related to unspecified psychosocial circumstances: Secondary | ICD-10-CM | POA: Diagnosis not present

## 2024-02-07 DIAGNOSIS — R4689 Other symptoms and signs involving appearance and behavior: Secondary | ICD-10-CM | POA: Diagnosis not present

## 2024-02-07 DIAGNOSIS — R4589 Other symptoms and signs involving emotional state: Secondary | ICD-10-CM | POA: Diagnosis not present

## 2024-02-07 NOTE — TOC Progression Note (Signed)
 Transition of Care [facility]) - Progression Note    Patient Details  Name: Martin [name] MRN: [id number] Date of Birth: [birth year]  [facility]) CM/SW Contact  Alexandra Ice, RN Phone Number: 02/07/2024, 4:24 PM  Clinical Narrative:    Met with [name] evaluator. Unfortunately interpreter did not show up until 4pm. [name] had to leave due to family appointment. Rescheduled appointment for 02/08/2024 at 9:30am.   Expected Discharge Plan:  (TBD) Barriers to Discharge: Continued Medical Work up  Expected Discharge Plan and Services                                               Social Determinants of Health (SDOH) Interventions SDOH Screenings   Food Insecurity: Patient Declined (08/29/2023)  Housing: Patient Declined (08/29/2023)  Transportation Needs: Patient Declined (08/29/2023)  Utilities: Patient Declined (08/29/2023)  Social Connections: Unknown (10/10/2023)  Tobacco Use: Low Risk  (04/29/2023)    Readmission Risk Interventions     No data to display

## 2024-02-07 NOTE — Plan of Care (Signed)
   Problem: Safety: Goal: Ability to remain free from injury will improve Outcome: Progressing
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
## 2024-02-08 DIAGNOSIS — F4323 Adjustment disorder with mixed anxiety and depressed mood: Secondary | ICD-10-CM | POA: Diagnosis not present

## 2024-02-08 MED ORDER — MAGNESIUM HYDROXIDE 400 MG/5ML PO SUSP
60.0000 mL | Freq: Every day | ORAL | Status: DC | PRN
  Administered 2024-02-08 – 2024-02-09 (×2): 60 mL via ORAL
  Filled 2024-02-08 (×2): qty 60

## 2024-02-08 NOTE — Plan of Care (Signed)
   Problem: Safety: Goal: Ability to remain free from injury will improve Outcome: Progressing
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
## 2024-02-08 NOTE — TOC Progression Note (Signed)
 Transition of Care [facility]) - Progression Note    Patient Details  Name: Martin [name] MRN: [id number] Date of Birth: 1956/05/22  [facility] of [facility]) CM/SW Contact  Alexandra Ice, RN Phone Number: 02/08/2024, 11:48 AM  Clinical Narrative:     Lenise Quince from [facility] was here with Interpreter to attempt to complete the level II evaluation. Patient refused to complete evaluation. Lenise Quince will have to complete the evaluation with the medical records received and speak with legal guardian, Kathaleen Pale. Provided information for legal guardian. Pending outcome of Level II PASRR   Expected Discharge Plan:  (TBD) Barriers to Discharge: Continued Medical Work up  Expected Discharge Plan and Services                                               Social Determinants of Health (SDOH) Interventions SDOH Screenings   Food Insecurity: Patient Declined (08/29/2023)  Housing: Patient Declined (08/29/2023)  Transportation Needs: Patient Declined (08/29/2023)  Utilities: Patient Declined (08/29/2023)  Social Connections: Unknown (10/10/2023)  Tobacco Use: Low Risk  (04/29/2023)    Readmission Risk Interventions     No data to display

## 2024-02-09 DIAGNOSIS — F4323 Adjustment disorder with mixed anxiety and depressed mood: Secondary | ICD-10-CM | POA: Diagnosis not present

## 2024-02-09 NOTE — Plan of Care (Signed)
   Problem: Safety: Goal: Ability to remain free from injury will improve Outcome: Progressing
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
## 2024-02-09 NOTE — TOC Progression Note (Signed)
 Transition of Care [facility]) - Progression Note    Patient Details  Name: Martin [name] MRN: [id number] Date of Birth: 06-25-1956  Transition of Care [facility]) CM/SW Contact  Alexandra Ice, RN Phone Number: 02/09/2024, 11:15 AM  Clinical Narrative:    TOC checked [name], PASRR Level II evaluation is still pending.    Expected Discharge Plan:  (TBD) Barriers to Discharge: Continued Medical Work up  Expected Discharge Plan and Services                                               Social Determinants of Health (SDOH) Interventions SDOH Screenings   Food Insecurity: Patient Declined (08/29/2023)  Housing: Patient Declined (08/29/2023)  Transportation Needs: Patient Declined (08/29/2023)  Utilities: Patient Declined (08/29/2023)  Social Connections: Unknown (10/10/2023)  Tobacco Use: Low Risk  (04/29/2023)    Readmission Risk Interventions     No data to display

## 2024-02-10 DIAGNOSIS — F4323 Adjustment disorder with mixed anxiety and depressed mood: Secondary | ICD-10-CM | POA: Diagnosis not present
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
## 2024-02-10 NOTE — Plan of Care (Signed)
   Problem: Safety: Goal: Ability to remain free from injury will improve Outcome: Progressing

## 2024-02-11 ENCOUNTER — Inpatient Hospital Stay

## 2024-02-11 DIAGNOSIS — F4323 Adjustment disorder with mixed anxiety and depressed mood: Secondary | ICD-10-CM | POA: Diagnosis not present

## 2024-02-11 NOTE — Plan of Care (Signed)
   Problem: Safety: Goal: Ability to remain free from injury will improve Outcome: Progressing
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
## 2024-02-12 ENCOUNTER — Inpatient Hospital Stay

## 2024-02-12 DIAGNOSIS — F4323 Adjustment disorder with mixed anxiety and depressed mood: Secondary | ICD-10-CM | POA: Diagnosis not present

## 2024-02-12 MED ORDER — POLYETHYLENE GLYCOL 3350 17 G PO PACK
17.0000 g | PACK | Freq: Two times a day (BID) | ORAL | Status: DC
  Administered 2024-02-12 – 2024-02-13 (×3): 17 g via ORAL
  Filled 2024-02-12 (×2): qty 1

## 2024-02-12 NOTE — Progress Notes (Signed)
 I spoke with the interpreting service about the CT and new plan of care. I also relayed to [place] via the white board that he needed another "xray" and not to eat until after it is done tomorrow. He seemed to understand. Amy, his interpreter, will be here today 5/5 at 0800 am to help discuss the results and poc with him & the MD/surgical team. If they could try to meet her at his room around then, that would be great!

## 2024-02-12 NOTE — Consult Note (Signed)
 SURGICAL CONSULTATION NOTE   HISTORY OF PRESENT ILLNESS (HPI):  68 y.o. male  that presented with nausea and abdominal distention yesterday. Abdominal xray showed small bowel dilation concerning for bowel obstruction. Consulted by Dr. Broadus Canes for suspected constipation.   Upon evaluation of the patient today was not productive since patient not cooperative. I did not have available sign language interpreter. History taken from Hospitalist.   Patient has history of colonic pseudoobstruciton s/p colostomy. Also with chronic severe constipation.   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)      PAST SURGICAL HISTORY (PSH):  Past Surgical History:  Procedure Laterality Date   EYE SURGERY Right    "relieved fluid pressure and cleaned it out"   KNEE ARTHROSCOPY Right 12/22/2015   Procedure: ARTHROSCOPY WASHOUT OF SEPTIC RIGHT KNEE;  Surgeon: Saundra Curl, MD;  Location: [facility] OR;  Service: Orthopedics;  Laterality: Right;   KNEE ARTHROSCOPY Right 06/24/2023   Procedure: ARTHROSCOPY KNEE  DEBRIDEMENT AND WASHOUT;  Surgeon: Marlynn Singer, MD;  Location: [facility] ORS;  Service: Orthopedics;  Laterality: Right;   KNEE SURGERY Left 2015/2016     MEDICATIONS:  Prior to Admission medications   Medication Sig Start Date End Date Taking? Authorizing Provider  acetaminophen  (TYLENOL ) 325 MG tablet Take 650 mg by mouth every 8 (eight) hours as needed.   Yes [provider]  bimatoprost  (LUMIGAN ) 0.01 % SOLN Place 1 drop into both eyes at bedtime. Patient taking differently: Place 1 drop into both eyes in the morning, at noon, and at bedtime. 0900/1300/2100 04/05/17  Yes Noel, Tiffany S, PA-C  clonazePAM  (KLONOPIN ) 0.5 MG tablet Take 0.5 mg by mouth 2 (two) times daily as needed for anxiety.   Yes [provider]   diclofenac  Sodium (VOLTAREN ) 1 % GEL Apply 2 g topically every 6 (six) hours as needed. Apply to right shoulder   Yes [provider]  dorzolamide -timolol  (COSOPT ) 22.3-6.8 MG/ML ophthalmic solution Place 1 drop into both eyes 2 (two) times daily. 04/05/17  Yes Dovie Gell, Tiffany S, PA-C  levothyroxine  (SYNTHROID ) 125 MCG tablet Take 1 tablet (125 mcg total) by mouth daily before breakfast. 06/06/20  Yes Vada Garibaldi, MD  linaclotide  (LINZESS ) 290 MCG CAPS capsule Take 1 capsule (290 mcg total) by mouth daily before breakfast. 12/09/20  Yes Khatri, Hina, PA-C  Menthol , Topical Analgesic, (BIOFREEZE) 4 % GEL Apply 1 Application topically in the morning and at bedtime.   Yes [provider]  Netarsudil  Dimesylate (RHOPRESSA) 0.02 % SOLN Place 1 drop into both eyes at bedtime.   Yes [provider]  polyethylene glycol powder (GLYCOLAX /MIRALAX ) 17 GM/SCOOP powder Take 17 g by mouth 2 (two) times daily. 06/06/20  Yes Vada Garibaldi, MD  traMADol  (ULTRAM ) 50 MG tablet Take by mouth every 12 (twelve) hours as needed.   Yes [provider]     ALLERGIES:  Allergies  Allergen Reactions   Ivp Dye [Iodinated Contrast Media] Other (See Comments)    Pt reports he has seizures.     SOCIAL HISTORY:  Social History   Socioeconomic History   Marital status: Married    Spouse name: Not on file   Number of children: Not on file   Years of education: Not on file  Highest education level: Not on file  Occupational History   Not on file  Tobacco Use   Smoking status: Never   Smokeless tobacco: Never  Substance and Sexual Activity   Alcohol  use: No   Drug use: No   Sexual activity: Yes  Other Topics Concern   Not on file  Social History Narrative   Not on file   Social Drivers of Health   Financial Resource Strain: Not on file  Food Insecurity: Patient Declined (08/29/2023)   Hunger Vital Sign    Worried About Running Out of Food in the Last Year: Patient  declined    Ran Out of Food in the Last Year: Patient declined  Transportation Needs: Patient Declined (08/29/2023)   PRAPARE - Administrator, Civil Service (Medical): Patient declined    Lack of Transportation (Non-Medical): Patient declined  Physical Activity: Not on file  Stress: Not on file  Social Connections: Unknown (10/10/2023)   Social Connection and Isolation Panel [NHANES]    Frequency of Communication with Friends and Family: More than three times a week    Frequency of Social Gatherings with Friends and Family: Patient unable to answer    Attends Religious Services: Patient unable to answer    Active Member of Clubs or Organizations: Patient unable to answer    Attends Banker Meetings: Patient unable to answer    Marital Status: Patient unable to answer  Intimate Partner Violence: Patient Declined (08/29/2023)   Humiliation, Afraid, Rape, and Kick questionnaire    Fear of Current or Ex-Partner: Patient declined    Emotionally Abused: Patient declined    Physically Abused: Patient declined    Sexually Abused: Patient declined      FAMILY HISTORY:  Family History  Family history unknown: Yes      VITAL SIGNS:  Resp:  [18] 18 (05/04 2210)       Weight: 60 kg     INTAKE/OUTPUT:  This shift: No intake/output data recorded.  Last 2 shifts: @IOLAST2SHIFTS @   PHYSICAL EXAM:  Constitutional:  -- no distress, not cooperative Abdomen:  -- not distended. Unable to palpate since patient did not cooperated.    Labs:     Latest Ref Rng & Units 09/26/2023    2:49 AM 07/05/2023    5:18 PM 06/27/2023   12:40 PM  CBC  WBC 4.0 - 10.5 K/uL 6.8  4.5  4.9   Hemoglobin 13.0 - 17.0 g/dL 16.1  8.5  9.0   Hematocrit 39.0 - 52.0 % 32.5  27.0  28.2   Platelets 150 - 400 K/uL 155  199  181       Latest Ref Rng & Units 09/26/2023    2:49 AM 07/05/2023    5:18 PM 06/27/2023   12:40 PM  CMP  Glucose 70 - 99 mg/dL 93  096  045   BUN 8 - 23 mg/dL 24  21   24    Creatinine 0.61 - 1.24 mg/dL 4.09  8.11  9.14   Sodium 135 - 145 mmol/L 132  136  136   Potassium 3.5 - 5.1 mmol/L 4.5  4.0  4.1   Chloride 98 - 111 mmol/L 100  97  98   CO2 22 - 32 mmol/L 24  30  29    Calcium 8.9 - 10.3 mg/dL 9.1  9.3  9.8   Total Protein 6.5 - 8.1 g/dL   8.0   Total Bilirubin 0.3 - 1.2 mg/dL   0.4  Alkaline Phos 38 - 126 U/L   53   AST 15 - 41 U/L   38   ALT 0 - 44 U/L   25      Imaging studies:  Narrative & Impression  CLINICAL DATA:  Rule out small bowel obstruction.   EXAM: CT ABDOMEN AND PELVIS WITHOUT CONTRAST   TECHNIQUE: Multidetector CT imaging of the abdomen and pelvis was performed following the standard protocol without IV contrast.   RADIATION DOSE REDUCTION: This exam was performed according to the departmental dose-optimization program which includes automated exposure control, adjustment of the mA and/or kV according to patient size and/or use of iterative reconstruction technique.   COMPARISON:  CT abdomen pelvis dated 03/31/2023.   FINDINGS: Evaluation of this exam is limited in the absence of intravenous contrast.   Lower chest: The visualized lung bases are clear.   No intra-abdominal free air. Small free fluid in the left upper abdomen.   Hepatobiliary: The liver is unremarkable. No biliary dilatation. Layering stone in the gallbladder. No pericholecystic fluid or evidence of acute cholecystitis by CT.   Pancreas: Unremarkable. No pancreatic ductal dilatation or surrounding inflammatory changes.   Spleen: Normal in size without focal abnormality.   Adrenals/Urinary Tract: The adrenal glands unremarkable. There is no hydronephrosis or nephrolithiasis on either side. The visualized ureters and urinary bladder appear unremarkable.   Stomach/Bowel: Evaluation of the bowel is limited in the absence of oral contrast and due to paucity of intra-abdominal fat. There is postsurgical changes of the bowel with a left lower  quadrant colostomy. There is mild thickening and inflammatory changes of the descending colon proximal to the colostomy. There is mild twisting of the mesentery in the mid abdomen. No definite evidence of small-bowel obstruction. Small-bowel series may provide better evaluation.   Vascular/Lymphatic: Mild aortoiliac atherosclerotic disease. The IVC is unremarkable. No portal venous gas. No adenopathy.   Reproductive: Mildly enlarged prostate gland measuring 4.5 cm in transverse axial diameter. The seminal vesicles are symmetric.   Other: None   Musculoskeletal: Bilateral femoral head avascular necrosis, left greater than right. No acute osseous pathology.   IMPRESSION: 1. Postsurgical changes of the bowel with a left lower quadrant colostomy. Mild thickening and inflammatory changes of the descending colon proximal to the colostomy may represent colitis. 2. No definite evidence of small-bowel obstruction. Small-bowel series may provide better evaluation. 3. Cholelithiasis. 4. Bilateral femoral head avascular necrosis, left greater than right. 5.  Aortic Atherosclerosis (ICD10-I70.0).     Electronically Signed   By: Angus Bark M.D.   On: 02/12/2024 15:26    Assessment/Plan:  68 y.o. male with nausea, complicated by pertinent comorbidities including chronic constipation.  - Consulted for suspected bowel obstruction suggested on abdominal xray - Patient having ostomy output - CT scan of the abdomen negative for obstruction - Recommend aggressive bowel regimen such as: bowel prep divided in two days, increasing Miralax  twice per day, increase fiber in diet, Enema through colostomy, among other.  - Nor surgical intervention needed - General surgery will sign off.   Lucila Rye, MD

## 2024-02-12 NOTE — TOC Progression Note (Signed)
 Transition of Care [facility]) - Progression Note    Patient Details  Name: Martin [name] MRN: [id number] Date of Birth: 29-Jun-1956  [facility] of [facility] CM/SW Contact  Alexandra Ice, RN Phone Number: 02/12/2024, 4:13 PM  Clinical Narrative:     TOC checked NCMUST, PASRR Level II eval still pending. Patient has new symptoms of possible bowel obstruction, pending CT scan.   Expected Discharge Plan:  (TBD) Barriers to Discharge: Continued Medical Work up  Expected Discharge Plan and Services                                               Social Determinants of Health (SDOH) Interventions SDOH Screenings   Food Insecurity: Patient Declined (08/29/2023)  Housing: Patient Declined (08/29/2023)  Transportation Needs: Patient Declined (08/29/2023)  Utilities: Patient Declined (08/29/2023)  Social Connections: Unknown (10/10/2023)  Tobacco Use: Low Risk  (04/29/2023)    Readmission Risk Interventions     No data to display
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
## 2024-02-13 DIAGNOSIS — R4589 Other symptoms and signs involving emotional state: Secondary | ICD-10-CM | POA: Diagnosis not present

## 2024-02-13 DIAGNOSIS — R4689 Other symptoms and signs involving appearance and behavior: Secondary | ICD-10-CM | POA: Diagnosis not present

## 2024-02-13 DIAGNOSIS — Z659 Problem related to unspecified psychosocial circumstances: Secondary | ICD-10-CM | POA: Diagnosis not present

## 2024-02-13 MED ORDER — CLONAZEPAM 0.5 MG PO TABS: 0.5000 mg | ORAL_TABLET | Freq: Two times a day (BID) | ORAL | 0 refills | Status: DC | PRN

## 2024-02-13 MED ORDER — DIPHENHYDRAMINE HCL 50 MG/ML IJ SOLN
50.0000 mg | Freq: Once | INTRAMUSCULAR | Status: AC
  Administered 2024-02-13: 50 mg via INTRAMUSCULAR
  Filled 2024-02-13: qty 1

## 2024-02-13 MED ORDER — HALOPERIDOL LACTATE 5 MG/ML IJ SOLN
5.0000 mg | Freq: Once | INTRAMUSCULAR | Status: AC
  Administered 2024-02-13: 5 mg via INTRAMUSCULAR
  Filled 2024-02-13: qty 1

## 2024-02-13 MED ORDER — DIPHENHYDRAMINE HCL 50 MG/ML IJ SOLN: 50.0000 mg | Freq: Once | INTRAMUSCULAR | Status: DC

## 2024-02-13 MED ORDER — OLANZAPINE 5 MG PO TBDP: 5.0000 mg | ORAL_TABLET | Freq: Two times a day (BID) | ORAL | 0 refills | Status: DC | PRN
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
## 2024-02-13 NOTE — TOC Progression Note (Signed)
 Transition of Care [facility]) - Progression Note    Patient Details  Name: Martin [name] MRN: [id number] Date of Birth: 03/18/1956  Transition of [facility]) CM/SW Contact  Alexandra Ice, RN Phone Number: 02/13/2024, 8:49 AM  Clinical Narrative:     Received PASRR number for patient. Left messages with [facility], seeing if they are able to accept patient today. MD stated patient is medically ready for discharge. Awaiting call back from facility.   Expected Discharge Plan:  (TBD) Barriers to Discharge: Continued Medical Work up  Expected Discharge Plan and Services                                               Social Determinants of Health (SDOH) Interventions SDOH Screenings   Food Insecurity: Patient Declined (08/29/2023)  Housing: Patient Declined (08/29/2023)  Transportation Needs: Patient Declined (08/29/2023)  Utilities: Patient Declined (08/29/2023)  Social Connections: Unknown (10/10/2023)  Tobacco Use: Low Risk  (04/29/2023)    Readmission Risk Interventions     No data to display

## 2024-02-13 NOTE — Plan of Care (Signed)
  Problem: Safety: Goal: Ability to remain free from injury will improve 02/13/2024 1737 by Kevyn Wengert N, RN Outcome: Adequate for Discharge 02/13/2024 1109 by Drena Gentile, RN Outcome: Progressing

## 2024-02-13 NOTE — Progress Notes (Addendum)
 Notified Interpreter services unable to do video chat at the moment. Transport services enroute. Notified patient per white board that we were giving him his IM Benadryl  to help him relax prior to transport since he is being combative.   Charge nurse at bedside with me.

## 2024-02-13 NOTE — Progress Notes (Signed)
 Report called to Ardelia Beau from [facility] who will receive patient going to room 113 at [phone]. All questions answered.
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
## 2024-02-13 NOTE — Progress Notes (Signed)
 Haldol  IM administered per order. No change in patient behavior. [facility] not able to take patient with them to transport to [facility] today due to combative behavior. Will try again tomorrow.
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
## 2024-02-13 NOTE — TOC Transition Note (Signed)
 Transition of Care [facility]) - Discharge Note   Patient Details  Name: Martin [name] MRN: [id number] Date of Birth: [birth year]  [facility] CM/SW Contact:  Alexandra Ice, RN Phone Number: 02/13/2024, 1:21 PM   Clinical Narrative:    Received PASRR number. [facility] able to accept patient today. Discharge summary and orders place. Patient has not been ambulating. Spoke with Blaise Bumps at [facility] scheduled transport for pick up time of 3pm. Spoke with Kelli regarding interpreter services, have someone coming around 2:30-3pm to let him know that he will be discharging today to nursing facility.  Spoke with Maudine Sos and notified her of discharge plan.    Final next level of care: Long Term Nursing Home Barriers to Discharge: Barriers Resolved   Patient Goals and CMS Choice Patient states their goals for this hospitalization and ongoing recovery are:: to go home CMS Medicare.gov Compare Post Acute Care list provided to:: Patient Choice offered to / list presented to : Patient      Discharge Placement                Patient to be transferred to facility by: [facility] Name of family member notified: Kathaleen Pale, legal guardian Patient and family notified of of transfer: 02/13/24  Discharge Plan and Services Additional resources added to the After Visit Summary for                    DME Agency: NA                  Social Drivers of Health (SDOH) Interventions SDOH Screenings   Food Insecurity: Patient Declined (08/29/2023)  Housing: Patient Declined (08/29/2023)  Transportation Needs: Patient Declined (08/29/2023)  Utilities: Patient Declined (08/29/2023)  Social Connections: Unknown (10/10/2023)  Tobacco Use: Low Risk  (04/29/2023)     Readmission Risk Interventions     No data to display

## 2024-02-13 NOTE — Plan of Care (Signed)
   Problem: Safety: Goal: Ability to remain free from injury will improve Outcome: Progressing

## 2024-02-13 NOTE — Progress Notes (Addendum)
 Pt was not d/c because [facility] refused to take pt b/c combative behavior. Haldol  IM 5mg  x 1 given but no change in behavior. Consider increase dose of IM haldol  or another medication that will relax pt for transfer to [facility]. Also, consider different transportation to [facility]. Please notify CM early in AM to discuss other options.   This is a non-billable note.

## 2024-02-14 ENCOUNTER — Encounter: Payer: Self-pay | Admitting: Adult Health

## 2024-02-14 ENCOUNTER — Non-Acute Institutional Stay (SKILLED_NURSING_FACILITY): Payer: Self-pay | Admitting: Adult Health

## 2024-02-14 DIAGNOSIS — R4589 Other symptoms and signs involving emotional state: Secondary | ICD-10-CM

## 2024-02-14 DIAGNOSIS — I9589 Other hypotension: Secondary | ICD-10-CM | POA: Diagnosis not present

## 2024-02-14 DIAGNOSIS — H40053 Ocular hypertension, bilateral: Secondary | ICD-10-CM

## 2024-02-14 DIAGNOSIS — Z933 Colostomy status: Secondary | ICD-10-CM | POA: Diagnosis not present

## 2024-02-14 DIAGNOSIS — I700 Atherosclerosis of aorta: Secondary | ICD-10-CM

## 2024-02-14 DIAGNOSIS — R4189 Other symptoms and signs involving cognitive functions and awareness: Secondary | ICD-10-CM

## 2024-02-14 DIAGNOSIS — K5981 Ogilvie syndrome: Secondary | ICD-10-CM | POA: Diagnosis not present

## 2024-02-14 DIAGNOSIS — E039 Hypothyroidism, unspecified: Secondary | ICD-10-CM

## 2024-02-14 DIAGNOSIS — G8929 Other chronic pain: Secondary | ICD-10-CM | POA: Insufficient documentation

## 2024-02-14 DIAGNOSIS — I6782 Cerebral ischemia: Secondary | ICD-10-CM

## 2024-02-14 DIAGNOSIS — R29818 Other symptoms and signs involving the nervous system: Secondary | ICD-10-CM | POA: Insufficient documentation

## 2024-02-14 DIAGNOSIS — Z659 Problem related to unspecified psychosocial circumstances: Secondary | ICD-10-CM

## 2024-02-14 DIAGNOSIS — R4689 Other symptoms and signs involving appearance and behavior: Secondary | ICD-10-CM

## 2024-02-14 DIAGNOSIS — F4323 Adjustment disorder with mixed anxiety and depressed mood: Secondary | ICD-10-CM

## 2024-02-14 DIAGNOSIS — D500 Iron deficiency anemia secondary to blood loss (chronic): Secondary | ICD-10-CM | POA: Diagnosis not present
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
## 2024-02-14 NOTE — Progress Notes (Signed)
 Patient is deaf and 90% blind.has a legal guardian and deemed unable to make decisions for himself. Patient has been medically cleared to discharge to [facility]ter. This was the second attempt today to get patient discharged. Prior to discharge patient was informed via his white board that he was leaving to the faculty which he yelled at staff signaled he was not leaving. When transport arrived ASL interpreter services were contacted and on the phone advised patient that transport was here to pick him up. At the bedside were primary RN Renaldo Caroli, myself, other staff members,[facility]) and security. Patient was moved from the bed to the stretcher then became combative and agitated with staff, transport and security. Alonza Arthurs was notified which she  promptly arrived to assess the situation. For patient and transport safety patient was restrained with bilateral soft wrist restraints for transport.

## 2024-02-14 NOTE — Progress Notes (Addendum)
 Patient was due for discharge yesterday 02/13/24 but was cancelled per attending due to patient being combative and agitated despite receiving sedatives. Per transport team, they could not transport him safely due to aggressive behavior.  Discharge was attempted again today and patient pre medicated with Benadryl  50 mg IM prior to transport. However, patient became agitated, combative, and throwing things at staff when transport came to pick him up.  Soft bilateral restraints applied for patient's and staff safety during transport.  Alonza Arthurs, DNP, CCRN, FNP-C, AGACNP-BC Acute Care & Family Nurse Practitioner  [facility]  See Amion for personal pager PCCM on call pager [phone] until 7 am

## 2024-02-14 NOTE — Progress Notes (Signed)
 Interpreter Services called and on the phone with pt while we were transferring him, attempted to communicate with via whiteboard as well and pt proceeded to throw at us . Security, nurses, NTs, AC and NP all at bedside helping with discharge. Majority of patients belongs are with him (cell phone, Ipad, chargers, whiteboard, bookbag, duffle bag, razors, and snacks). Some patient belongings were left behind we have set them asides to reach out to his legal guardian to obtain them.   Discharge packet, face sheet, medical necessity, and AVS sheet sent with transport.   [facility] called by me and notified that the patient was on the way to them.

## 2024-02-14 NOTE — Progress Notes (Unsigned)
 Location:  [facility] Room Number: 113/W Place of Service:  SNF (31)   CODE STATUS: Full  Allergies  Allergen Reactions   Ivp Dye [Iodinated Contrast Media] Other (See Comments)    Pt reports he has seizures.    Chief Complaint  Patient presents with   Hospitalization Follow-up    HPI:    Past Medical History:  Diagnosis Date   Chronic constipation    Deaf    Gout    Hypothyroidism    Legally blind    Ogilvie's syndrome    Pyogenic arthritis of right knee joint [facility])     Past Surgical History:  Procedure Laterality Date   EYE SURGERY Right    "relieved fluid pressure and cleaned it out"   KNEE ARTHROSCOPY Right 12/22/2015   Procedure: ARTHROSCOPY WASHOUT OF SEPTIC RIGHT KNEE;  Surgeon: Saundra Curl, MD;  Location: [facility] OR;  Service: Orthopedics;  Laterality: Right;   KNEE ARTHROSCOPY Right 06/24/2023   Procedure: ARTHROSCOPY KNEE  DEBRIDEMENT AND WASHOUT;  Surgeon: Marlynn Singer, MD;  Location: [facility] ORS;  Service: Orthopedics;  Laterality: Right;   KNEE SURGERY Left 2015/2016    Social History   Socioeconomic History   Marital status: Married    Spouse name: Not on file   Number of children: Not on file   Years of education: Not on file   Highest education level: Not on file  Occupational History   Not on file  Tobacco Use   Smoking status: Never   Smokeless tobacco: Never  Substance and Sexual Activity   Alcohol  use: No   Drug use: No   Sexual activity: Yes  Other Topics Concern   Not on file  Social History Narrative   Not on file   Social Drivers of Health   Financial Resource Strain: Not on file  Food Insecurity: Patient Declined (08/29/2023)   Hunger Vital Sign    Worried About Running Out of Food in the Last Year: Patient declined    Ran Out of Food in the Last Year: Patient declined  Transportation Needs: Patient Declined (08/29/2023)   PRAPARE - Administrator, Civil Service (Medical): Patient  declined    Lack of Transportation (Non-Medical): Patient declined  Physical Activity: Not on file  Stress: Not on file  Social Connections: Unknown (10/10/2023)   Social Connection and Isolation Panel [NHANES]    Frequency of Communication with Friends and Family: More than three times a week    Frequency of Social Gatherings with Friends and Family: Patient unable to answer    Attends Religious Services: Patient unable to answer    Active Member of Clubs or Organizations: Patient unable to answer    Attends Banker Meetings: Patient unable to answer    Marital Status: Patient unable to answer  Intimate Partner Violence: Patient Declined (08/29/2023)   Humiliation, Afraid, Rape, and Kick questionnaire    Fear of Current or Ex-Partner: Patient declined    Emotionally Abused: Patient declined    Physically Abused: Patient declined    Sexually Abused: Patient declined   Family History  Family history unknown: Yes      VITAL SIGNS There were no vitals taken for this visit.  Outpatient Encounter Medications as of 02/14/2024  Medication Sig   acetaminophen  (TYLENOL ) 325 MG tablet Take 650 mg by mouth every 8 (eight) hours as needed.   bimatoprost  (LUMIGAN ) 0.01 % SOLN Place 1 drop into both eyes at bedtime. (Patient  taking differently: Place 1 drop into both eyes in the morning, at noon, and at bedtime. 0900/1300/2100)   clonazePAM  (KLONOPIN ) 0.5 MG tablet Take 1 tablet (0.5 mg total) by mouth 2 (two) times daily as needed for up to 5 days for anxiety.   diclofenac  Sodium (VOLTAREN ) 1 % GEL Apply 2 g topically every 6 (six) hours as needed. Apply to right shoulder   dorzolamide -timolol  (COSOPT ) 22.3-6.8 MG/ML ophthalmic solution Place 1 drop into both eyes 2 (two) times daily.   levothyroxine  (SYNTHROID ) 125 MCG tablet Take 1 tablet (125 mcg total) by mouth daily before breakfast.   linaclotide  (LINZESS ) 290 MCG CAPS capsule Take 1 capsule (290 mcg total) by mouth daily  before breakfast.   Menthol , Topical Analgesic, (BIOFREEZE) 4 % GEL Apply 1 Application topically in the morning and at bedtime.   Netarsudil  Dimesylate (RHOPRESSA) 0.02 % SOLN Place 1 drop into both eyes at bedtime.   OLANZapine  zydis (ZYPREXA ) 5 MG disintegrating tablet Take 1 tablet (5 mg total) by mouth 2 (two) times daily as needed for up to 5 days (agitation).   polyethylene glycol powder (GLYCOLAX /MIRALAX ) 17 GM/SCOOP powder Take 17 g by mouth 2 (two) times daily.   traMADol  (ULTRAM ) 50 MG tablet Take by mouth every 12 (twelve) hours as needed.   No facility-administered encounter medications on file as of 02/14/2024.     SIGNIFICANT DIAGNOSTIC EXAMS       ASSESSMENT/ PLAN:     Britt Candle NP [facility] Adult Medicine  Contact [phone] Monday through Friday 8am- 5pm  After hours call [phone]

## 2024-02-15 ENCOUNTER — Encounter: Payer: Self-pay | Admitting: Adult Health

## 2024-02-15 NOTE — Patient Instructions (Signed)
 I have reviewed the [facility] medical records in Epic as well as Gomez Lathe NP's notes of 5/7. Staff reports that he refuses vital signs and communicates only with his interpreter.  He has not been eating the meals here at the facility and has been dependent upon fast foods brought in by the interpreter from restaurants such as [employer], [employer], [employer].  He refuses to bathe and will not let his colostomy be checked.  She has swatted at staff members when they have attempted to interact with him. When I attempted to interview and examine him; he pulled the blanket down and looked in my direction,uttered an indiscernible vocalization,and then pulled the blanket back over his head. The plan at this time is to see if he can adapt to the nursing facility and understand that he has nothing to fear here. In the meantime we will work with the interpreter to see if he truly does want to return to his home community of [place], [place] . I did not create a separate note as completion of an interview and physical examination is not possible at this time.                                                                                                                                                                Lester Rather.  Donnice Gale MD

## 2024-02-29 ENCOUNTER — Encounter: Payer: Self-pay | Admitting: Adult Health

## 2024-02-29 ENCOUNTER — Non-Acute Institutional Stay (SKILLED_NURSING_FACILITY): Payer: Self-pay | Admitting: Adult Health

## 2024-02-29 DIAGNOSIS — I700 Atherosclerosis of aorta: Secondary | ICD-10-CM | POA: Diagnosis not present

## 2024-02-29 DIAGNOSIS — F4323 Adjustment disorder with mixed anxiety and depressed mood: Secondary | ICD-10-CM | POA: Diagnosis not present

## 2024-02-29 DIAGNOSIS — E43 Unspecified severe protein-calorie malnutrition: Secondary | ICD-10-CM

## 2024-02-29 DIAGNOSIS — I6782 Cerebral ischemia: Secondary | ICD-10-CM | POA: Diagnosis not present

## 2024-02-29 NOTE — Progress Notes (Signed)
 Location:  [facility] Room Number: 113 Place of Service:  SNF (31)   CODE STATUS: full   Allergies  Allergen Reactions   Ivp Dye [Iodinated Contrast Media] Other (See Comments)    Pt reports he has seizures.    Chief Complaint  Patient presents with   Acute Visit    Care plan meeting     HPI:  We have come together for his care plan meeting. Guardian present.  BIMS 9/15 mood 0/30. He can become combative with staff members; is declining medications; adl care; declining labs. He is bed bound without falls. He requires dependent assist with adl care. He has colostomy for which he will decline care has occasional bladder incontinence. Dietary: regular diet feeds self; poor po intake; has declined weights. Therapy: none at this time. He will continue to be followed for his chronic illnesses including:   Chronic cerebral ischemia  Aortic atherosclerosis   Protein calorie malnutrition severe  Past Medical History:  Diagnosis Date   Chronic constipation    Deaf    Gout    Hypothyroidism    Legally blind    Ogilvie's syndrome    Pyogenic arthritis of right knee joint [facility])     Past Surgical History:  Procedure Laterality Date   EYE SURGERY Right    "relieved fluid pressure and cleaned it out"   KNEE ARTHROSCOPY Right 12/22/2015   Procedure: ARTHROSCOPY WASHOUT OF SEPTIC RIGHT KNEE;  Surgeon: Saundra Curl, MD;  Location: [facility] OR;  Service: Orthopedics;  Laterality: Right;   KNEE ARTHROSCOPY Right 06/24/2023   Procedure: ARTHROSCOPY KNEE  DEBRIDEMENT AND WASHOUT;  Surgeon: Marlynn Singer, MD;  Location: [facility] ORS;  Service: Orthopedics;  Laterality: Right;   KNEE SURGERY Left 2015/2016    Social History   Socioeconomic History   Marital status: Married    Spouse name: Not on file   Number of children: Not on file   Years of education: Not on file   Highest education level: Not on file  Occupational History   Not on file  Tobacco Use   Smoking  status: Never   Smokeless tobacco: Never  Substance and Sexual Activity   Alcohol  use: No   Drug use: No   Sexual activity: Yes  Other Topics Concern   Not on file  Social History Narrative   Not on file   Social Drivers of Health   Financial Resource Strain: Not on file  Food Insecurity: Patient Declined (08/29/2023)   Hunger Vital Sign    Worried About Running Out of Food in the Last Year: Patient declined    Ran Out of Food in the Last Year: Patient declined  Transportation Needs: Patient Declined (08/29/2023)   PRAPARE - Administrator, Civil Service (Medical): Patient declined    Lack of Transportation (Non-Medical): Patient declined  Physical Activity: Not on file  Stress: Not on file  Social Connections: Unknown (10/10/2023)   Social Connection and Isolation Panel [NHANES]    Frequency of Communication with Friends and Family: More than three times a week    Frequency of Social Gatherings with Friends and Family: Patient unable to answer    Attends Religious Services: Patient unable to answer    Active Member of Clubs or Organizations: Patient unable to answer    Attends Banker Meetings: Patient unable to answer    Marital Status: Patient unable to answer  Intimate Partner Violence: Patient Declined (08/29/2023)   Humiliation,  Afraid, Rape, and Kick questionnaire    Fear of Current or Ex-Partner: Patient declined    Emotionally Abused: Patient declined    Physically Abused: Patient declined    Sexually Abused: Patient declined   Family History  Family history unknown: Yes      VITAL SIGNS BP 112/72   Pulse 75   Temp (!) 97 F (36.1 C)   Resp 16   SpO2 98%   Outpatient Encounter Medications as of 02/29/2024  Medication Sig   acetaminophen  (TYLENOL ) 650 MG CR tablet Take 650 mg by mouth every 8 (eight) hours as needed.   bimatoprost  (LUMIGAN ) 0.01 % SOLN Place 1 drop into both eyes at bedtime.   clonazePAM  (KLONOPIN ) 0.5 MG tablet  Take 1 tablet (0.5 mg total) by mouth 2 (two) times daily as needed for up to 5 days for anxiety.   diclofenac  Sodium (VOLTAREN ) 1 % GEL Apply 2 g topically every 6 (six) hours as needed. Apply to right shoulder   dorzolamide -timolol  (COSOPT ) 22.3-6.8 MG/ML ophthalmic solution Place 1 drop into both eyes 2 (two) times daily.   levothyroxine  (SYNTHROID ) 125 MCG tablet Take 1 tablet (125 mcg total) by mouth daily before breakfast.   linaclotide  (LINZESS ) 290 MCG CAPS capsule Take 1 capsule (290 mcg total) by mouth daily before breakfast.   Netarsudil  Dimesylate (RHOPRESSA) 0.02 % SOLN Place 1 drop into both eyes at bedtime.   polyethylene glycol powder (GLYCOLAX /MIRALAX ) 17 GM/SCOOP powder Take 17 g by mouth 2 (two) times daily. (Patient taking differently: Take 17 g by mouth 2 (two) times daily as needed.)   [DISCONTINUED] Menthol , Topical Analgesic, (BIOFREEZE) 4 % GEL Apply 1 Application topically in the morning and at bedtime.   [DISCONTINUED] OLANZapine  zydis (ZYPREXA ) 5 MG disintegrating tablet Take 1 tablet (5 mg total) by mouth 2 (two) times daily as needed for up to 5 days (agitation).   No facility-administered encounter medications on file as of 02/29/2024.     SIGNIFICANT DIAGNOSTIC EXAMS  LABS REVIEWED;   09-26-23: wbc 6.8; hgb 10.7; hct 32.5; mcv 89.3 plt 155; glucose 93; bun 24; creat 0.88; k+ 4.5; na++ 132; ca 9.1 gfr >60  Review of Systems  Reason unable to perform ROS: would not participate.    Physical Exam Constitutional:      General: He is not in acute distress.    Appearance: He is well-developed. He is not diaphoretic.  HENT:     Ears:     Comments: deaf Eyes:     Comments: Legally blind  Neck:     Thyroid : No thyromegaly.  Cardiovascular:     Rate and Rhythm: Normal rate and regular rhythm.     Heart sounds: Normal heart sounds.  Pulmonary:     Effort: Pulmonary effort is normal. No respiratory distress.     Breath sounds: Normal breath sounds.   Abdominal:     General: Bowel sounds are normal. There is no distension.     Palpations: Abdomen is soft.     Tenderness: There is no abdominal tenderness.     Comments: Has colostomy  Musculoskeletal:        General: Normal range of motion.     Cervical back: Neck supple.     Right lower leg: No edema.     Left lower leg: No edema.  Lymphadenopathy:     Cervical: No cervical adenopathy.  Skin:    General: Skin is warm and dry.  Neurological:     Mental Status: He  is alert. Mental status is at baseline.  Psychiatric:        Mood and Affect: Mood normal.     ASSESSMENT/ PLAN:  TODAY  Chronic cerebral ischemia Aortic atherosclerosis  Protein calorie malnutrition severe  Will change tylenol  and miralax  as needed Will continue current plan of care Will continue to monitor his status.   Time spent with patient: 40 minutes: medications; plan of care; ability to participate with care.    Britt Candle NP [facility] Adult Medicine  call [phone]

## 2024-03-01 NOTE — Patient Instructions (Signed)
 See assessment and plan under each diagnosis in the problem list and acutely for this interaction.   Adjustment disorder with mixed anxiety and depressed mood 03/01/2024 his interpreter Azucena Leos was onsite and signing at bedside with him.  This offered an opportunity for me to attempt to complete an interview and exam.  He declined to be interviewed by me while the interpreter was here and refused any examination.  This is a repetitive pattern . He will refuse his medications and the scheduled meals. Instead he demands someone buy fast food for him.  Today he stated that he wanted to leave and wanted "apartment, hotel, or mobile home."  He refused his eyedrops because they were not in the same colored bottle as administered in the hospital.  He did not understand that each pharmacy may dispense a medicine as a different color or in different colored container.  He continues to refuse colostomy care and strikes at staff when attempting such.  When I placed my hand on the foot of the bed he screamed out.  The interpreter stated that he was saying "do not touch my foot." Despite staff's attempt to support him physically and emotionally; he is refusing all efforts on their part.

## 2024-03-01 NOTE — Assessment & Plan Note (Addendum)
 03/01/2024 his interpreter Azucena Leos was onsite and signing at bedside with him.  This offered an opportunity for me to attempt to complete an interview and exam.  He declined to be interviewed by me while the interpreter was here and refused any examination.  This is a repetitive pattern . He will refuse his medications and the scheduled meals. Instead he demands someone buy fast food for him.  Today he stated that he wanted to leave and wanted "apartment, hotel, or mobile home."  He refused his eyedrops because they were not in the same colored bottle as administered in the hospital.  He did not understand that each pharmacy may dispense a medicine as a different color or in different colored container.  He continues to refuse colostomy care and strikes at staff when attempting such.  When I placed my hand on the foot of the bed he screamed out.  The interpreter stated that he was saying "do not touch my foot." Despite staff's attempt to support him physically and emotionally; he is refusing all efforts on their part.

## 2024-03-14 ENCOUNTER — Encounter: Payer: Self-pay | Admitting: Adult Health

## 2024-03-14 ENCOUNTER — Non-Acute Institutional Stay (SKILLED_NURSING_FACILITY): Payer: Self-pay | Admitting: Adult Health

## 2024-03-14 DIAGNOSIS — I9589 Other hypotension: Secondary | ICD-10-CM

## 2024-03-14 DIAGNOSIS — Z933 Colostomy status: Secondary | ICD-10-CM

## 2024-03-14 DIAGNOSIS — K5981 Ogilvie syndrome: Secondary | ICD-10-CM | POA: Diagnosis not present

## 2024-03-14 DIAGNOSIS — D500 Iron deficiency anemia secondary to blood loss (chronic): Secondary | ICD-10-CM

## 2024-03-14 NOTE — Progress Notes (Unsigned)
 Location:  [facility] Room Number: 113 Place of Service:  SNF (31)   CODE STATUS: full   Allergies  Allergen Reactions   Ivp Dye [Iodinated Contrast Media] Other (See Comments)    Pt reports he has seizures.    Chief Complaint  Patient presents with   Medical Management of Chronic Issues               Other specified hypotension: Ogilvie syndrome/colostomy status: Iron  deficiency anemia due to chronic blood loss: Non adherence to medical treatment:    HPI:  He is a 68 y.o. long term resident of this facility being seen for the management of his chronic illnesses:  Other specified hypotension: Ogilvie syndrome/colostomy status: Iron  deficiency anemia due to chronic blood loss: Non adherence to medical treatment. He continues to decline medications; adl care and lab work. There are no reports of uncontrolled pain.    Past Medical History:  Diagnosis Date   Chronic constipation    Deaf    Gout    Hypothyroidism    Legally blind    Ogilvie's syndrome    Pyogenic arthritis of right knee joint [facility])     Past Surgical History:  Procedure Laterality Date   EYE SURGERY Right    "relieved fluid pressure and cleaned it out"   KNEE ARTHROSCOPY Right 12/22/2015   Procedure: ARTHROSCOPY WASHOUT OF SEPTIC RIGHT KNEE;  Surgeon: Saundra Curl, MD;  Location: [facility] OR;  Service: Orthopedics;  Laterality: Right;   KNEE ARTHROSCOPY Right 06/24/2023   Procedure: ARTHROSCOPY KNEE  DEBRIDEMENT AND WASHOUT;  Surgeon: Marlynn Singer, MD;  Location: [facility] ORS;  Service: Orthopedics;  Laterality: Right;   KNEE SURGERY Left 2015/2016    Social History   Socioeconomic History   Marital status: Married    Spouse name: Not on file   Number of children: Not on file   Years of education: Not on file   Highest education level: Not on file  Occupational History   Not on file  Tobacco Use   Smoking status: Never   Smokeless tobacco: Never  Substance and Sexual Activity    Alcohol  use: No   Drug use: No   Sexual activity: Yes  Other Topics Concern   Not on file  Social History Narrative   Not on file   Social Drivers of Health   Financial Resource Strain: Not on file  Food Insecurity: Patient Declined (08/29/2023)   Hunger Vital Sign    Worried About Running Out of Food in the Last Year: Patient declined    Ran Out of Food in the Last Year: Patient declined  Transportation Needs: Patient Declined (08/29/2023)   PRAPARE - Administrator, Civil Service (Medical): Patient declined    Lack of Transportation (Non-Medical): Patient declined  Physical Activity: Not on file  Stress: Not on file  Social Connections: Unknown (10/10/2023)   Social Connection and Isolation Panel [NHANES]    Frequency of Communication with Friends and Family: More than three times a week    Frequency of Social Gatherings with Friends and Family: Patient unable to answer    Attends Religious Services: Patient unable to answer    Active Member of Clubs or Organizations: Patient unable to answer    Attends Banker Meetings: Patient unable to answer    Marital Status: Patient unable to answer  Intimate Partner Violence: Patient Declined (08/29/2023)   Humiliation, Afraid, Rape, and Kick questionnaire    Fear  of Current or Ex-Partner: Patient declined    Emotionally Abused: Patient declined    Physically Abused: Patient declined    Sexually Abused: Patient declined   Family History  Family history unknown: Yes      VITAL SIGNS BP 112/72   Pulse 75   Temp (!) 97 F (36.1 C)   Resp 16   Outpatient Encounter Medications as of 03/14/2024  Medication Sig   acetaminophen  (TYLENOL ) 650 MG CR tablet Take 650 mg by mouth every 8 (eight) hours as needed.   bimatoprost  (LUMIGAN ) 0.01 % SOLN Place 1 drop into both eyes at bedtime.   clonazePAM  (KLONOPIN ) 0.5 MG tablet Take 1 tablet (0.5 mg total) by mouth 2 (two) times daily as needed for up to 5 days for  anxiety.   diclofenac  Sodium (VOLTAREN ) 1 % GEL Apply 2 g topically every 6 (six) hours as needed. Apply to right shoulder   dorzolamide -timolol  (COSOPT ) 22.3-6.8 MG/ML ophthalmic solution Place 1 drop into both eyes 2 (two) times daily.   levothyroxine  (SYNTHROID ) 125 MCG tablet Take 1 tablet (125 mcg total) by mouth daily before breakfast.   linaclotide  (LINZESS ) 290 MCG CAPS capsule Take 1 capsule (290 mcg total) by mouth daily before breakfast.   Netarsudil  Dimesylate (RHOPRESSA) 0.02 % SOLN Place 1 drop into both eyes at bedtime.   polyethylene glycol powder (GLYCOLAX /MIRALAX ) 17 GM/SCOOP powder Take 17 g by mouth 2 (two) times daily. (Patient taking differently: Take 17 g by mouth 2 (two) times daily as needed.)   No facility-administered encounter medications on file as of 03/14/2024.     SIGNIFICANT DIAGNOSTIC EXAMS  LABS REVIEWED;   09-26-23: wbc 6.8; hgb 10.7; hct 32.5; mcv 89.3 plt 155; glucose 93; bun 24; creat 0.88; k+ 4.5; na++ 132; ca 9.1 gfr >60  Review of Systems  Reason unable to perform ROS: unwilling to answer questions.   Physical Exam Constitutional:      Appearance: Normal appearance.  HENT:     Ears:     Comments: deaf Eyes:     Comments: Legally blind   Abdominal:     Comments: colostomy  Neurological:     Mental Status: He is alert.    He declined any physical exam. His is no acute distress and is resting quietly.    ASSESSMENT/ PLAN:  TODAY  Other specified hypotension: he is presently stable without medications.   2. Adelbert Adler syndrome/colostomy status: is on linzess  290 mcg daily; and miralax  twice daily   3. Iron  deficiency anemia due to chronic blood loss: hgb 10.7  4. Non adherence to medical treatment: he is declining to take most medications; and is declining adl care; lab work    PREVIOUS   5. Chronic cerebral ischemia/neurocognitive deficits: does have a guardian; ischemia per ct 09-26-23.   6. Aortic atherosclerosis (ct 02-12-24) is  not on statin  7. Increased intraocular pressure bilateral: will continue lumigan  to each eye nightly; cosopt  to both eyes twice daily; rhopressa to both eyes daily   8. Adjustment disorder with mixed anxiety and depressed mood/behavioral, emotional, and social difficulties: presently has klonopin  0.5 mg twice daily as needed; and has zyprexa  5 mg twice daily as needed will continue to monitor his mood state  9. Hypothyroidism, unspecified: will continue synthroid  125 mcg daily   10. Chronic generalized pain: will stop tramadol  will begin tylenol  CR 650 mg every 8 hours on a routine bases.   11. Neurocognitive disorder: has been declared unable to make medical decision  as per the hospital discharge summary     Britt Candle NP [facility] Adult Medicine   call [phone]

## 2024-04-19 ENCOUNTER — Encounter: Payer: Self-pay | Admitting: Adult Health

## 2024-04-19 ENCOUNTER — Non-Acute Institutional Stay (SKILLED_NURSING_FACILITY): Payer: Self-pay | Admitting: Adult Health

## 2024-04-19 DIAGNOSIS — H40053 Ocular hypertension, bilateral: Secondary | ICD-10-CM

## 2024-04-19 DIAGNOSIS — R4189 Other symptoms and signs involving cognitive functions and awareness: Secondary | ICD-10-CM

## 2024-04-19 DIAGNOSIS — R29818 Other symptoms and signs involving the nervous system: Secondary | ICD-10-CM | POA: Diagnosis not present

## 2024-04-19 DIAGNOSIS — I700 Atherosclerosis of aorta: Secondary | ICD-10-CM | POA: Diagnosis not present

## 2024-04-19 DIAGNOSIS — I6782 Cerebral ischemia: Secondary | ICD-10-CM | POA: Diagnosis not present

## 2024-04-19 NOTE — Progress Notes (Unsigned)
 Location:  [facility] Room Number: 113 w Place of Service:  SNF (31)   CODE STATUS: Full Code   Allergies  Allergen Reactions   Ivp Dye [Iodinated Contrast Media] Other (See Comments)    Pt reports he has seizures.    Chief Complaint  Patient presents with   Medical Management of Chronic Issues    Routine visit     HPI:    Past Medical History:  Diagnosis Date   Chronic constipation    Deaf    Gout    Hypothyroidism    Legally blind    Ogilvie's syndrome    Pyogenic arthritis of right knee joint [facility])     Past Surgical History:  Procedure Laterality Date   EYE SURGERY Right    relieved fluid pressure and cleaned it out   KNEE ARTHROSCOPY Right 12/22/2015   Procedure: ARTHROSCOPY WASHOUT OF SEPTIC RIGHT KNEE;  Surgeon: Evalene JONETTA Chancy, MD;  Location: [facility] OR;  Service: Orthopedics;  Laterality: Right;   KNEE ARTHROSCOPY Right 06/24/2023   Procedure: ARTHROSCOPY KNEE  DEBRIDEMENT AND WASHOUT;  Surgeon: Cleotilde Barrio, MD;  Location: [facility] ORS;  Service: Orthopedics;  Laterality: Right;   KNEE SURGERY Left 2015/2016    Social History   Socioeconomic History   Marital status: Married    Spouse name: Not on file   Number of children: Not on file   Years of education: Not on file   Highest education level: Not on file  Occupational History   Not on file  Tobacco Use   Smoking status: Never   Smokeless tobacco: Never  Substance and Sexual Activity   Alcohol  use: No   Drug use: No   Sexual activity: Yes  Other Topics Concern   Not on file  Social History Narrative   Not on file   Social Drivers of Health   Financial Resource Strain: Not on file  Food Insecurity: Patient Declined (08/29/2023)   Hunger Vital Sign    Worried About Running Out of Food in the Last Year: Patient declined    Ran Out of Food in the Last Year: Patient declined  Transportation Needs: Patient Declined (08/29/2023)   PRAPARE - Scientist, research (physical sciences) (Medical): Patient declined    Lack of Transportation (Non-Medical): Patient declined  Physical Activity: Not on file  Stress: Not on file  Social Connections: Unknown (10/10/2023)   Social Connection and Isolation Panel    Frequency of Communication with Friends and Family: More than three times a week    Frequency of Social Gatherings with Friends and Family: Patient unable to answer    Attends Religious Services: Patient unable to answer    Active Member of Clubs or Organizations: Patient unable to answer    Attends Banker Meetings: Patient unable to answer    Marital Status: Patient unable to answer  Intimate Partner Violence: Patient Declined (08/29/2023)   Humiliation, Afraid, Rape, and Kick questionnaire    Fear of Current or Ex-Partner: Patient declined    Emotionally Abused: Patient declined    Physically Abused: Patient declined    Sexually Abused: Patient declined   Family History  Family history unknown: Yes      VITAL SIGNS BP 112/72   Pulse 75   SpO2 98%   Outpatient Encounter Medications as of 04/19/2024  Medication Sig   acetaminophen  (TYLENOL ) 650 MG CR tablet Take 650 mg by mouth every 6 (six) hours as needed.  diclofenac  Sodium (VOLTAREN ) 1 % GEL Apply 2 g topically every 6 (six) hours as needed. Apply to right shoulder   dorzolamide -timolol  (COSOPT ) 22.3-6.8 MG/ML ophthalmic solution Place 1 drop into both eyes 2 (two) times daily.   latanoprost  (XALATAN ) 0.005 % ophthalmic solution Place 1 drop into both eyes at bedtime.   levothyroxine  (SYNTHROID ) 125 MCG tablet Take 1 tablet (125 mcg total) by mouth daily before breakfast.   linaclotide  (LINZESS ) 290 MCG CAPS capsule Take 1 capsule (290 mcg total) by mouth daily before breakfast.   Netarsudil  Dimesylate (RHOPRESSA) 0.02 % SOLN Place 1 drop into both eyes at bedtime.   polyethylene glycol (MIRALAX  / GLYCOLAX ) 17 g packet Take 17 g by mouth 2 (two) times daily as needed.    [DISCONTINUED] polyethylene glycol powder (GLYCOLAX /MIRALAX ) 17 GM/SCOOP powder Take 17 g by mouth 2 (two) times daily.   bimatoprost  (LUMIGAN ) 0.01 % SOLN Place 1 drop into both eyes at bedtime. (Patient not taking: Reported on 04/19/2024)   clonazePAM  (KLONOPIN ) 0.5 MG tablet Take 1 tablet (0.5 mg total) by mouth 2 (two) times daily as needed for up to 5 days for anxiety. (Patient not taking: Reported on 04/19/2024)   No facility-administered encounter medications on file as of 04/19/2024.     SIGNIFICANT DIAGNOSTIC EXAMS       ASSESSMENT/ PLAN:     Barnie Seip NP [facility]  Contact [phone] Monday through Friday 8am- 5pm  After hours call [phone]

## 2024-04-30 ENCOUNTER — Non-Acute Institutional Stay: Payer: Self-pay | Admitting: Adult Health

## 2024-04-30 ENCOUNTER — Encounter: Payer: Self-pay | Admitting: Adult Health

## 2024-04-30 NOTE — Progress Notes (Unsigned)
 Location:  [facility] Room Number: North/113/W Place of Service:  SNF (31)   CODE STATUS: Full Code  Allergies  Allergen Reactions   Ivp Dye [Iodinated Contrast Media] Other (See Comments)    Pt reports he has seizures.    Chief Complaint  Patient presents with   Fussy    for behaviors     HPI:    Past Medical History:  Diagnosis Date   Chronic constipation    Deaf    Gout    Hypothyroidism    Legally blind    Ogilvie's syndrome    Pyogenic arthritis of right knee joint [facility]     Past Surgical History:  Procedure Laterality Date   EYE SURGERY Right    relieved fluid pressure and cleaned it out   KNEE ARTHROSCOPY Right 12/22/2015   Procedure: ARTHROSCOPY WASHOUT OF SEPTIC RIGHT KNEE;  Surgeon: Evalene JONETTA Chancy, MD;  Location: [facility] OR;  Service: Orthopedics;  Laterality: Right;   KNEE ARTHROSCOPY Right 06/24/2023   Procedure: ARTHROSCOPY KNEE  DEBRIDEMENT AND WASHOUT;  Surgeon: Cleotilde Barrio, MD;  Location: [facility] ORS;  Service: Orthopedics;  Laterality: Right;   KNEE SURGERY Left 2015/2016    Social History   Socioeconomic History   Marital status: Married    Spouse name: Not on file   Number of children: Not on file   Years of education: Not on file   Highest education level: Not on file  Occupational History   Not on file  Tobacco Use   Smoking status: Never   Smokeless tobacco: Never  Vaping Use   Vaping status: Never Used  Substance and Sexual Activity   Alcohol  use: No   Drug use: No   Sexual activity: Yes  Other Topics Concern   Not on file  Social History Narrative   Not on file   Social Drivers of Health   Financial Resource Strain: Not on file  Food Insecurity: Patient Declined (08/29/2023)   Hunger Vital Sign    Worried About Running Out of Food in the Last Year: Patient declined    Ran Out of Food in the Last Year: Patient declined  Transportation Needs: Patient Declined (08/29/2023)   PRAPARE - Therapist, art (Medical): Patient declined    Lack of Transportation (Non-Medical): Patient declined  Physical Activity: Not on file  Stress: Not on file  Social Connections: Unknown (10/10/2023)   Social Connection and Isolation Panel    Frequency of Communication with Friends and Family: More than three times a week    Frequency of Social Gatherings with Friends and Family: Patient unable to answer    Attends Religious Services: Patient unable to answer    Active Member of Clubs or Organizations: Patient unable to answer    Attends Banker Meetings: Patient unable to answer    Marital Status: Patient unable to answer  Intimate Partner Violence: Patient Declined (08/29/2023)   Humiliation, Afraid, Rape, and Kick questionnaire    Fear of Current or Ex-Partner: Patient declined    Emotionally Abused: Patient declined    Physically Abused: Patient declined    Sexually Abused: Patient declined   Family History  Family history unknown: Yes      VITAL SIGNS BP 112/72   Pulse 75   Temp (!) 97 F (36.1 C)   Resp 16   Ht 6' 1 (1.854 m)   SpO2 98%   BMI 17.45 kg/m   Outpatient Encounter  Medications as of 04/30/2024  Medication Sig   acetaminophen  (TYLENOL ) 650 MG CR tablet Take 650 mg by mouth every 6 (six) hours as needed.   diclofenac  Sodium (VOLTAREN ) 1 % GEL Apply 2 g topically every 6 (six) hours as needed. Apply to right shoulder   dorzolamide -timolol  (COSOPT ) 22.3-6.8 MG/ML ophthalmic solution Place 1 drop into both eyes 2 (two) times daily.   latanoprost  (XALATAN ) 0.005 % ophthalmic solution Place 1 drop into both eyes at bedtime.   levothyroxine  (SYNTHROID ) 125 MCG tablet Take 1 tablet (125 mcg total) by mouth daily before breakfast.   linaclotide  (LINZESS ) 290 MCG CAPS capsule Take 1 capsule (290 mcg total) by mouth daily before breakfast.   Netarsudil  Dimesylate (RHOPRESSA) 0.02 % SOLN Place 1 drop into both eyes at bedtime.   polyethylene  glycol (MIRALAX  / GLYCOLAX ) 17 g packet Take 17 g by mouth 2 (two) times daily as needed.   No facility-administered encounter medications on file as of 04/30/2024.     SIGNIFICANT DIAGNOSTIC EXAMS       ASSESSMENT/ PLAN:     Barnie Seip NP [facility]  Contact [phone] Monday through Friday 8am- 5pm  After hours call [phone]

## 2024-05-01 NOTE — Progress Notes (Signed)
 This encounter was created in error - please disregard.

## 2024-05-03 ENCOUNTER — Non-Acute Institutional Stay (SKILLED_NURSING_FACILITY): Admitting: Adult Health

## 2024-05-03 ENCOUNTER — Encounter (HOSPITAL_COMMUNITY): Payer: Self-pay

## 2024-05-03 ENCOUNTER — Emergency Department (HOSPITAL_COMMUNITY)
Admission: EM | Admit: 2024-05-03 | Discharge: 2024-05-04 | Disposition: A | Discharge status: Home / Self Care | Source: Skilled Nursing Facility | Attending: Emergency Medicine | Admitting: Emergency Medicine

## 2024-05-03 ENCOUNTER — Encounter: Payer: Self-pay | Admitting: Adult Health

## 2024-05-03 DIAGNOSIS — I129 Hypertensive chronic kidney disease with stage 1 through stage 4 chronic kidney disease, or unspecified chronic kidney disease: Secondary | ICD-10-CM | POA: Diagnosis not present

## 2024-05-03 DIAGNOSIS — E039 Hypothyroidism, unspecified: Secondary | ICD-10-CM | POA: Diagnosis not present

## 2024-05-03 DIAGNOSIS — R451 Restlessness and agitation: Secondary | ICD-10-CM | POA: Insufficient documentation

## 2024-05-03 DIAGNOSIS — R454 Irritability and anger: Secondary | ICD-10-CM

## 2024-05-03 DIAGNOSIS — N189 Chronic kidney disease, unspecified: Secondary | ICD-10-CM | POA: Diagnosis not present

## 2024-05-03 DIAGNOSIS — R456 Violent behavior: Secondary | ICD-10-CM | POA: Insufficient documentation

## 2024-05-03 DIAGNOSIS — Z79899 Other long term (current) drug therapy: Secondary | ICD-10-CM | POA: Insufficient documentation

## 2024-05-03 LAB — URINALYSIS, ROUTINE W REFLEX MICROSCOPIC
Bilirubin Urine: NEGATIVE
Glucose, UA: NEGATIVE mg/dL
Hgb urine dipstick: NEGATIVE
Ketones, ur: NEGATIVE mg/dL
Leukocytes,Ua: NEGATIVE
Nitrite: NEGATIVE
Protein, ur: NEGATIVE mg/dL
Specific Gravity, Urine: 1.02 (ref 1.005–1.030)
pH: 6 (ref 5.0–8.0)

## 2024-05-03 LAB — RAPID URINE DRUG SCREEN, HOSP PERFORMED
Amphetamines: NOT DETECTED
Barbiturates: NOT DETECTED
Benzodiazepines: NOT DETECTED
Cocaine: NOT DETECTED
Opiates: NOT DETECTED
Tetrahydrocannabinol: NOT DETECTED

## 2024-05-03 MED ORDER — ZIPRASIDONE MESYLATE 20 MG IM SOLR
20.0000 mg | Freq: Once | INTRAMUSCULAR | Status: DC
  Filled 2024-05-03: qty 20

## 2024-05-03 MED ORDER — STERILE WATER FOR INJECTION IJ SOLN
INTRAMUSCULAR | Status: AC
  Filled 2024-05-03: qty 10

## 2024-05-03 MED ORDER — ACETAMINOPHEN 325 MG PO TABS
650.0000 mg | ORAL_TABLET | Freq: Once | ORAL | Status: AC
  Administered 2024-05-03: 650 mg via ORAL
  Filled 2024-05-03: qty 2

## 2024-05-03 NOTE — ED Notes (Signed)
 AC notified re: RN attempting to call report to [facility], per [facility] she spoke with the facility at this time they are waiting to hear back from the their administrator but was told that he was unable to return to the facility, MD notified

## 2024-05-03 NOTE — ED Provider Notes (Addendum)
 [place] EMERGENCY DEPARTMENT AT [facility] Provider Note  CSN: [id number] Arrival date & time: 05/03/24 1350  Chief Complaint(s) Aggressive Behavior (Pt will throw things and swat at people when touched.)  HPI Martin [name] is a 68 y.o. male history of deafness, legally blind, mute, hypothyroidism, behavioral issues presenting to the emergency department for agitation.  Patient has apparently been increasingly agitated at his nursing facility, aggressive with staff, throwing feces at staff, noncompliant with medication.  They sent him here for psychiatric evaluation.   In person interpreter Amy was able to talk to the patient.  She is familiar with the patient.  Patient reports that he is not homicidal not suicidal.  He denies any pain or discomfort.  Denies any fevers or chills.  Denies any nausea or vomiting.  He reports that he is very frustrated at his nursing facility because they come and bother him all the time.  He states that when he gets angry sometimes will throw something but he is not trying to hurt anybody. He reports he didn't mean to throw feces but his ostomy bag was full and nobody was changing it so he threw it. He would like people to just leave him alone.  He does not want to be in the emergency department.  He denies any other symptoms.  Denies any hallucinations.   Past Medical History Past Medical History:  Diagnosis Date   Chronic constipation    Deaf    Gout    Hypothyroidism    Legally blind    Ogilvie's syndrome    Pyogenic arthritis of right knee joint [facility])    Patient Active Problem List   Diagnosis Date Noted   Chronic cerebral ischemia 02/14/2024   Neurocognitive deficits 02/14/2024   Aortic atherosclerosis (HCC) 02/14/2024   Increased intraocular pressure, bilateral 02/14/2024   Chronic generalized pain 02/14/2024   Nail dystrophy 12/23/2023   Behavioural, emotional, and social difficulties (BESD) 12/08/2023   Agitation 11/29/2023    Colostomy prolapse (HCC) 11/29/2023   Patient incapable of making informed decisions 10/25/2023   Protein-calorie malnutrition, severe 08/25/2023   Toenail fungus 08/08/2023   Epididymitis, bilateral 08/02/2023   Adjustment disorder 08/02/2023   Hypotension 08/01/2023   Impaired decision making 07/31/2023   Iron  deficiency anemia 07/27/2023   Colostomy status (HCC) 06/24/2023   Ogilvie's syndrome    CKD (chronic kidney disease)    Adjustment disorder with mixed anxiety and depressed mood 03/22/2017   Septic joint of right knee joint (HCC) 12/21/2015   Chronic pain syndrome 12/21/2015   Legally blind 12/21/2015   Deaf 12/21/2015   Hypothyroidism, unspecified 12/21/2015   Chronic constipation    Home Medication(s) Prior to Admission medications   Medication Sig Start Date End Date Taking? Authorizing Provider  acetaminophen  (TYLENOL ) 650 MG CR tablet Take 650 mg by mouth every 6 (six) hours as needed.    [provider]  diclofenac  Sodium (VOLTAREN ) 1 % GEL Apply 2 g topically every 6 (six) hours as needed. Apply to right shoulder    [provider]  dorzolamide -timolol  (COSOPT ) 22.3-6.8 MG/ML ophthalmic solution Place 1 drop into both eyes 2 (two) times daily. 04/05/17   Marcus Annabella RAMAN, PA-C  latanoprost  (XALATAN ) 0.005 % ophthalmic solution Place 1 drop into both eyes at bedtime.    [provider]  levothyroxine  (SYNTHROID ) 125 MCG tablet Take 1 tablet (125 mcg total) by mouth daily before breakfast. 06/06/20   Raenelle Coria, MD  linaclotide  (LINZESS ) 290 MCG CAPS capsule  Take 1 capsule (290 mcg total) by mouth daily before breakfast. 12/09/20   Khatri, Hina, PA-C  Netarsudil  Dimesylate (RHOPRESSA) 0.02 % SOLN Place 1 drop into both eyes at bedtime.    [provider]  polyethylene glycol (MIRALAX  / GLYCOLAX ) 17 g packet Take 17 g by mouth 2 (two) times daily as needed.    [provider]                                                                                                                                     Past Surgical History Past Surgical History:  Procedure Laterality Date   EYE SURGERY Right    relieved fluid pressure and cleaned it out   KNEE ARTHROSCOPY Right 12/22/2015   Procedure: ARTHROSCOPY WASHOUT OF SEPTIC RIGHT KNEE;  Surgeon: Evalene JONETTA Chancy, MD;  Location: [facility] OR;  Service: Orthopedics;  Laterality: Right;   KNEE ARTHROSCOPY Right 06/24/2023   Procedure: ARTHROSCOPY KNEE  DEBRIDEMENT AND WASHOUT;  Surgeon: Cleotilde Barrio, MD;  Location: [facility] ORS;  Service: Orthopedics;  Laterality: Right;   KNEE SURGERY Left 2015/2016   Family History Family History  Family history unknown: Yes    Social History Social History   Tobacco Use   Smoking status: Never   Smokeless tobacco: Never  Vaping Use   Vaping status: Never Used  Substance Use Topics   Alcohol  use: No   Drug use: No   Allergies Ivp dye [iodinated contrast media]  Review of Systems Review of Systems  All other systems reviewed and are negative.   Physical Exam Vital Signs  I have reviewed the triage vital signs There were no vitals taken for this visit. Physical Exam Vitals and nursing note reviewed.  Constitutional:      General: He is not in acute distress.    Appearance: Normal appearance.  HENT:     Mouth/Throat:     Mouth: Mucous membranes are moist.  Eyes:     Conjunctiva/sclera: Conjunctivae normal.  Cardiovascular:     Rate and Rhythm: Normal rate and regular rhythm.  Pulmonary:     Effort: Pulmonary effort is normal. No respiratory distress.     Breath sounds: Normal breath sounds.  Abdominal:     General: Abdomen is flat.     Palpations: Abdomen is soft.     Tenderness: There is no abdominal tenderness.     Comments: Ostomy in place  Musculoskeletal:     Right lower leg: No edema.     Left lower leg: No edema.  Skin:    General: Skin is warm and dry.     Capillary Refill: Capillary refill takes less  than 2 seconds.     Coloration: Skin is not jaundiced.  Neurological:     General: No focal deficit present.     Mental Status: He is alert and oriented to person, place, and time.     Comments: Moves  all 4 extremities, no gross cranial nerve deficit, able to communicate through sign language  Psychiatric:        Mood and Affect: Mood normal.        Behavior: Behavior is uncooperative.        Thought Content: Thought content does not include homicidal or suicidal ideation.     ED Results and Treatments Labs (all labs ordered are listed, but only abnormal results are displayed) Labs Reviewed  RAPID URINE DRUG SCREEN, HOSP PERFORMED  URINALYSIS, ROUTINE W REFLEX MICROSCOPIC                                                                                                                          Radiology No results found.  Pertinent labs & imaging results that were available during my care of the patient were reviewed by me and considered in my medical decision making (see MDM for details).  Medications Ordered in ED Medications - No data to display                                                                                                                                    Procedures Procedures  (including critical care time)  Medical Decision Making / ED Course   MDM:  68 year old presenting to the emergency department with apparent agitation.  Patient is overall well-appearing, physical examination with no focal finding.  He is not compliant with all vital signs but mentating normally, has normal heart rate on exam, no tachypnea.   Symptoms all seem behavioral due to patient's frustration with his nursing facility.  He denies any suicidal or homicidal ideation.  I do not think there is any indication for psychiatric consult right now.  The patient is not agitated. He does not meet criteria for involuntary hold.  In person interpreter is familiar with patient through  numerous other interactions and she reports he seems at his baseline.  He denies any hallucinations.  He seems to have a linear train of thought.  Per interpreter the facility is also frustrated with the patient.  I do not think there is any indication for further workup in the emergency department.  He specifically denies any complaints his examination is otherwise unremarkable. Will discharge back to his facility and legal residence      Additional history obtained: -Additional history obtained from ems -External records from outside source obtained and  reviewed including: Chart review including previous notes, labs, imaging, consultation notes including prior notes    Co morbidities that complicate the patient evaluation  Past Medical History:  Diagnosis Date   Chronic constipation    Deaf    Gout    Hypothyroidism    Legally blind    Ogilvie's syndrome    Pyogenic arthritis of right knee joint (HCC)       Dispostion: Disposition decision including need for hospitalization was considered, and patient discharged from emergency department.    Final Clinical Impression(s) / ED Diagnoses Final diagnoses:  Feeling angry     This chart was dictated using voice recognition software.  Despite best efforts to proofread,  errors can occur which can change the documentation meaning.    Francesca Elsie CROME, MD 05/03/24 ANASTASIA    Francesca Elsie CROME, MD 05/03/24 ANASTASIA    Francesca Elsie CROME, MD 05/03/24 ARTEMUS Francesca Elsie CROME, MD 05/03/24 2100

## 2024-05-03 NOTE — ED Notes (Signed)
 Pt called out and asked for his colostomy bag to be drained and cleaned. Pt tolerated well. Pt also allowed staff to change the bed sheet w/o behaviors. Pt asked for something to drink, staff provided. Resting comfortable, pt very pleasant.

## 2024-05-03 NOTE — ED Triage Notes (Signed)
 Pt BIB [facility] from [facility] nursing center for potential IVC. Pt is legally blind, nonverbal, and deaf. Pt is aggressive towards staff and refuses care. Pt is not ambulatory.

## 2024-05-03 NOTE — ED Notes (Signed)
 Spoke to stacy from [employer] interpreter service and they are trying to get an interpreter out here ASAP so [place] is being held.
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
## 2024-05-03 NOTE — ED Notes (Signed)
 Interpretor needed to leave. Pt asked interpretor to tell staff to make sure the lights are on, door open, and let him rest. Pt said he would call out if he needed anything.

## 2024-05-03 NOTE — ED Provider Notes (Signed)
 I spoke with Quintin Fell, [phone], administrator at the [facility].  Nursing had stated that they did not want to take the patient back.  Quintin explicitly stated that they are NOT refusing to take the patient back.  The [facility] is his legal residence.  There is no further indication for emergency department evaluation.  He does not meet psychiatric criteria for involuntary hold and has been very calm while in the emergency department and has been much more cooperative once appropriate communication was established with in person interpreter.  I do not think there is any indication for the patient to be in the emergency department at this time.     Francesca Elsie CROME, MD 05/03/24 [phone]

## 2024-05-04 MED ORDER — DICLOFENAC SODIUM 1 % EX GEL
2.0000 g | Freq: Once | CUTANEOUS | Status: DC
  Filled 2024-05-04: qty 100

## 2024-05-04 NOTE — ED Notes (Signed)
 Attempted to call report x 3 at [phone], [phone], [phone] [facility].

## 2024-05-04 NOTE — ED Notes (Signed)
 Patient has been resting all night. Only request was tylenol . No outbursts or behaviors, pt calm and cooperative while awake.

## 2024-05-04 NOTE — ED Notes (Signed)
 Interpreter at bedside tried to wake patient up and patient nodded and stated that he saw the interpreter, but patient was still groggy and patient went back to sleep. Patient was pleasant and calm, medic tried to turn lights off but patient did not like that lights turned back on, and will continue to monitor.

## 2024-05-08 ENCOUNTER — Encounter: Payer: Self-pay | Admitting: Internal Medicine

## 2024-05-08 ENCOUNTER — Non-Acute Institutional Stay (SKILLED_NURSING_FACILITY): Payer: Self-pay | Admitting: Internal Medicine

## 2024-05-08 DIAGNOSIS — E039 Hypothyroidism, unspecified: Secondary | ICD-10-CM

## 2024-05-08 DIAGNOSIS — H9193 Unspecified hearing loss, bilateral: Secondary | ICD-10-CM

## 2024-05-08 DIAGNOSIS — K5981 Ogilvie syndrome: Secondary | ICD-10-CM

## 2024-05-08 DIAGNOSIS — H548 Legal blindness, as defined in USA: Secondary | ICD-10-CM

## 2024-05-08 DIAGNOSIS — E559 Vitamin D deficiency, unspecified: Secondary | ICD-10-CM | POA: Diagnosis not present

## 2024-05-08 DIAGNOSIS — R4189 Other symptoms and signs involving cognitive functions and awareness: Secondary | ICD-10-CM

## 2024-05-08 DIAGNOSIS — D649 Anemia, unspecified: Secondary | ICD-10-CM | POA: Insufficient documentation

## 2024-05-08 DIAGNOSIS — R29818 Other symptoms and signs involving the nervous system: Secondary | ICD-10-CM

## 2024-05-08 NOTE — Assessment & Plan Note (Signed)
 [facility] will attempt to coordinate his care with these advocates; but this is limited by the facility being short-staffed ( The resident's Nurse is caring for > 25 patients each shift) & the interpreters not being available 24/7. Perhaps a modified schedule to facilitate med compliance can be pursued. Hopefully, his advocates can convince him he is in a safe environment & we wish to help prevent complications of his health conditions (vitamin D  deficiency , anemia & profound hypothyroidism & possibly other co-morbidities as yet undiagnosed)

## 2024-05-08 NOTE — Assessment & Plan Note (Signed)
 No bleeding dyscrasias documented by staff.  FOBT, iron  panel, B12, and folate labs indicated but refused by patient.
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
## 2024-05-08 NOTE — Assessment & Plan Note (Signed)
 At increased risk for fragility fracture because of prolonged bedrest and vitamin D  deficiency.  Vitamin D  supplementation ordered.

## 2024-05-08 NOTE — Assessment & Plan Note (Signed)
 I attempted to explain to the patient the risk of uncontrolled hyperthyroidism but she expressed no interest in the discussion or having her TSH updated.  As noted she has intake of L-thyroxine is erratic.  Will attempt to give her medicines in the afternoon to see if adherence will improve.

## 2024-05-08 NOTE — Patient Instructions (Signed)
 See assessment and plan under each diagnosis in the problem list and acutely for this visit . Total time  1 hour & 23 minutes; greater than 50% of the visit spent counseling patient and attempting to coordinate  care for problems addressed at this encounter

## 2024-05-08 NOTE — Assessment & Plan Note (Signed)
 MMSE for psychiatric reevaluation refused by patient.

## 2024-05-10 NOTE — Assessment & Plan Note (Signed)
 This is in context of profound hypothyroidism. He is on Linzess .

## 2024-05-15 ENCOUNTER — Encounter: Payer: Self-pay | Admitting: Adult Health

## 2024-05-15 ENCOUNTER — Non-Acute Institutional Stay (SKILLED_NURSING_FACILITY): Payer: Self-pay | Admitting: Adult Health

## 2024-05-15 DIAGNOSIS — E039 Hypothyroidism, unspecified: Secondary | ICD-10-CM | POA: Diagnosis not present

## 2024-05-15 DIAGNOSIS — R4189 Other symptoms and signs involving cognitive functions and awareness: Secondary | ICD-10-CM | POA: Diagnosis not present

## 2024-05-15 DIAGNOSIS — Z659 Problem related to unspecified psychosocial circumstances: Secondary | ICD-10-CM

## 2024-05-15 DIAGNOSIS — R29818 Other symptoms and signs involving the nervous system: Secondary | ICD-10-CM

## 2024-05-15 DIAGNOSIS — R4689 Other symptoms and signs involving appearance and behavior: Secondary | ICD-10-CM | POA: Diagnosis not present

## 2024-05-15 DIAGNOSIS — R4589 Other symptoms and signs involving emotional state: Secondary | ICD-10-CM

## 2024-05-15 NOTE — Progress Notes (Unsigned)
 Location:  [facility] Room Number: 49- W Place of Service:  SNF (31) Provider:  Landy Barnie RAMAN, NP    Patient Care Team: [facility] as PCP - General (Internal Medicine)  Extended Emergency Contact Information Primary Emergency Contact: Suggs,Joe Mobile Phone: [phone] Relation: Legal Guardian Secondary Emergency Contact: Moss,Willie Mobile Phone: [phone] Relation: Brother Interpreter needed? Yes  Code Status:  Full Code Goals of care: Advanced Directive information    05/03/2024    2:06 PM  Advanced Directives  Does Patient Have a Medical Advance Directive? Unable to assess, patient is non-responsive or altered mental status  Would patient like information on creating a medical advance directive? No - Patient declined     Chief Complaint  Patient presents with   Medical Management of Chronic Issues    HPI:  Pt is a 68 y.o. male seen today for medical management of chronic diseases.     Past Medical History:  Diagnosis Date   Chronic constipation    Deaf    Gout    Hypothyroidism    Legally blind    Ogilvie's syndrome    Pyogenic arthritis of right knee joint [facility])    Past Surgical History:  Procedure Laterality Date   EYE SURGERY Right    relieved fluid pressure and cleaned it out   KNEE ARTHROSCOPY Right 12/22/2015   Procedure: ARTHROSCOPY WASHOUT OF SEPTIC RIGHT KNEE;  Surgeon: Evalene JONETTA Chancy, MD;  Location: [facility] OR;  Service: Orthopedics;  Laterality: Right;   KNEE ARTHROSCOPY Right 06/24/2023   Procedure: ARTHROSCOPY KNEE  DEBRIDEMENT AND WASHOUT;  Surgeon: Cleotilde Barrio, MD;  Location: [facility] ORS;  Service: Orthopedics;  Laterality: Right;   KNEE SURGERY Left 2015/2016    Allergies  Allergen Reactions   Ivp Dye [Iodinated Contrast Media] Other (See Comments)    Pt reports he has seizures.    Outpatient Encounter Medications as of 05/15/2024  Medication Sig   acetaminophen  (TYLENOL ) 650 MG CR tablet Take 650  mg by mouth every 6 (six) hours as needed.   Cholecalciferol 25 MCG (1000 UT) capsule Take 2,000 Units by mouth daily.   diclofenac  Sodium (VOLTAREN ) 1 % GEL Apply 2 g topically every 6 (six) hours as needed. Apply to right shoulder   dorzolamide -timolol  (COSOPT ) 2-0.5 % ophthalmic solution Place 1 drop into both eyes 2 (two) times daily.   latanoprost  (XALATAN ) 0.005 % ophthalmic solution Place 1 drop into both eyes at bedtime.   levothyroxine  (SYNTHROID ) 125 MCG tablet Take 1 tablet (125 mcg total) by mouth daily before breakfast.   linaclotide  (LINZESS ) 290 MCG CAPS capsule Take 1 capsule (290 mcg total) by mouth daily before breakfast.   Netarsudil  Dimesylate (RHOPRESSA) 0.02 % SOLN Place 1 drop into both eyes at bedtime.   polyethylene glycol (MIRALAX  / GLYCOLAX ) 17 g packet Take 17 g by mouth 2 (two) times daily as needed.   dorzolamide -timolol  (COSOPT ) 22.3-6.8 MG/ML ophthalmic solution Place 1 drop into both eyes 2 (two) times daily. (Patient not taking: Reported on 05/15/2024)   No facility-administered encounter medications on file as of 05/15/2024.    Review of Systems  Immunization History  Administered Date(s) Administered   Influenza,inj,Quad PF,6+ Mos 12/23/2015   PFIZER(Purple Top)SARS-COV-2 Vaccination 06/05/2020   Pertinent  Health Maintenance Due  Topic Date Due   INFLUENZA VACCINE  05/10/2024   Colonoscopy  03/14/2025 (Originally 04/25/2003)      07/15/2020   11:47 AM 07/15/2020   10:08 PM 12/08/2020  6:24 PM 12/09/2020    7:27 AM 03/14/2024   10:53 AM  Fall Risk  Falls in the past year?     0  Was there an injury with Fall?     0  Fall Risk Category Calculator     0  (RETIRED) Patient Fall Risk Level High fall risk  High fall risk  Low fall risk  High fall risk    Patient at Risk for Falls Due to     Impaired balance/gait;Impaired mobility     Data saved with a previous flowsheet row definition   Functional Status Survey:    Vitals:   05/15/24 0931  BP: 112/72   Pulse: 75  Temp: (!) 97 F (36.1 C)  SpO2: 98%  Height: 6' 1 (1.854 m)   Body mass index is 17.45 kg/m. Physical Exam  Labs reviewed: Recent Labs    06/26/23 0950 06/27/23 1240 07/05/23 1718 09/26/23 0249  NA 135 136 136 132*  K 4.3 4.1 4.0 4.5  CL 100 98 97* 100  CO2 27 29 30 24   GLUCOSE 90 125* 108* 93  BUN 27* 24* 21 24*  CREATININE 0.98 1.02 1.07 0.88  CALCIUM 9.0 9.8 9.3 9.1  MG 2.0 1.9  --  2.0  PHOS 2.3* 2.4*  --   --    Recent Labs    06/05/23 1147 06/26/23 0950 06/27/23 1240  AST 41 31 38  ALT 27 20 25   ALKPHOS 47 48 53  BILITOT 0.5 0.5 0.4  PROT 8.4* 7.1 8.0  ALBUMIN 4.2 3.5 3.8   Recent Labs    06/23/23 1943 06/24/23 1206 06/26/23 2122 06/27/23 1240 07/05/23 1718 09/26/23 0249  WBC 5.4   < > 5.5 4.9 4.5 6.8  NEUTROABS 3.7  --  4.3 3.6  --   --   HGB 8.8*   < > 8.1* 9.0* 8.5* 10.7*  HCT 27.4*   < > 25.1* 28.2* 27.0* 32.5*  MCV 95.5   < > 94.7 95.9 95.4 89.3  PLT 227   < > 183 181 199 155   < > = values in this interval not displayed.   Lab Results  Component Value Date   TSH 48.394 (H) 06/05/2020   Lab Results  Component Value Date   HGBA1C 5.9 (H) 12/21/2015   Lab Results  Component Value Date   CHOL 200 12/22/2015   HDL 52 12/22/2015   LDLCALC 117 (H) 12/22/2015   TRIG 153 (H) 12/22/2015   CHOLHDL 3.8 12/22/2015    Significant Diagnostic Results in last 30 days:  No results found.  Assessment/Plan There are no diagnoses linked to this encounter.   Family/ staff Communication: ***  Labs/tests ordered:  ***

## 2024-05-29 ENCOUNTER — Encounter: Payer: Self-pay | Admitting: Internal Medicine

## 2024-06-07 ENCOUNTER — Non-Acute Institutional Stay (SKILLED_NURSING_FACILITY): Payer: Self-pay | Admitting: Adult Health

## 2024-06-07 ENCOUNTER — Encounter: Payer: Self-pay | Admitting: Adult Health

## 2024-06-07 DIAGNOSIS — Z933 Colostomy status: Secondary | ICD-10-CM

## 2024-06-07 DIAGNOSIS — I700 Atherosclerosis of aorta: Secondary | ICD-10-CM

## 2024-06-07 DIAGNOSIS — R29818 Other symptoms and signs involving the nervous system: Secondary | ICD-10-CM | POA: Diagnosis not present

## 2024-06-07 DIAGNOSIS — R4189 Other symptoms and signs involving cognitive functions and awareness: Secondary | ICD-10-CM | POA: Diagnosis not present

## 2024-06-07 NOTE — Progress Notes (Signed)
 Location:  [facility] Room Number: 77 W Place of Service:  SNF (31)   CODE STATUS: Full Code   Allergies  Allergen Reactions   Ivp Dye [Iodinated Contrast Media] Other (See Comments)    Pt reports he has seizures.    Chief Complaint  Patient presents with   Care Plan Meeting     HPI:  We have come together for his care plan meeting. He declined all interviews. He continues with behaviors: calling 911; declining meds, labs, care. He continues to act out with care givers. He spends all of his time in bed per his choice without falls. He requires total assist with his adl care. He is occasionally incontinent of bladder and has colostomy-will decline care. Dietary: regular diet: feeds self; appetite 76-100% declines weight. Therapy: none at this time. Activities: does not participate. He will continue to be followed for his chronic illnesses including:  Aortic atherosclerosis  Neurocognitive deficits   Colostomy status  Past Medical History:  Diagnosis Date   Chronic constipation    Deaf    Gout    Hypothyroidism    Legally blind    Ogilvie's syndrome    Pyogenic arthritis of right knee joint [facility])     Past Surgical History:  Procedure Laterality Date   EYE SURGERY Right    relieved fluid pressure and cleaned it out   KNEE ARTHROSCOPY Right 12/22/2015   Procedure: ARTHROSCOPY WASHOUT OF SEPTIC RIGHT KNEE;  Surgeon: Evalene JONETTA Chancy, MD;  Location: [facility] OR;  Service: Orthopedics;  Laterality: Right;   KNEE ARTHROSCOPY Right 06/24/2023   Procedure: ARTHROSCOPY KNEE  DEBRIDEMENT AND WASHOUT;  Surgeon: Cleotilde Barrio, MD;  Location: [facility] ORS;  Service: Orthopedics;  Laterality: Right;   KNEE SURGERY Left 2015/2016    Social History   Socioeconomic History   Marital status: Married    Spouse name: Not on file   Number of children: Not on file   Years of education: Not on file   Highest education level: Not on file  Occupational History   Not on file   Tobacco Use   Smoking status: Never   Smokeless tobacco: Never  Vaping Use   Vaping status: Never Used  Substance and Sexual Activity   Alcohol  use: No   Drug use: No   Sexual activity: Yes  Other Topics Concern   Not on file  Social History Narrative   Not on file   Social Drivers of Health   Financial Resource Strain: Not on file  Food Insecurity: Patient Declined (08/29/2023)   Hunger Vital Sign    Worried About Running Out of Food in the Last Year: Patient declined    Ran Out of Food in the Last Year: Patient declined  Transportation Needs: Patient Declined (08/29/2023)   PRAPARE - Administrator, Civil Service (Medical): Patient declined    Lack of Transportation (Non-Medical): Patient declined  Physical Activity: Not on file  Stress: Not on file  Social Connections: Unknown (10/10/2023)   Social Connection and Isolation Panel    Frequency of Communication with Friends and Family: More than three times a week    Frequency of Social Gatherings with Friends and Family: Patient unable to answer    Attends Religious Services: Patient unable to answer    Active Member of Clubs or Organizations: Patient unable to answer    Attends Banker Meetings: Patient unable to answer    Marital Status: Patient unable to answer  Intimate Partner Violence: Patient Declined (08/29/2023)   Humiliation, Afraid, Rape, and Kick questionnaire    Fear of Current or Ex-Partner: Patient declined    Emotionally Abused: Patient declined    Physically Abused: Patient declined    Sexually Abused: Patient declined   Family History  Family history unknown: Yes      VITAL SIGNS BP 112/72   Pulse 75   Temp (!) 97 F (36.1 C)   SpO2 98%   Outpatient Encounter Medications as of 06/07/2024  Medication Sig   acetaminophen  (TYLENOL ) 650 MG CR tablet Take 650 mg by mouth every 6 (six) hours as needed.   Cholecalciferol 25 MCG (1000 UT) capsule Take 2,000 Units by mouth  daily.   diclofenac  Sodium (VOLTAREN ) 1 % GEL Apply 2 g topically every 6 (six) hours as needed. Apply to right shoulder   dorzolamide -timolol  (COSOPT ) 22.3-6.8 MG/ML ophthalmic solution Place 1 drop into both eyes 2 (two) times daily.   latanoprost  (XALATAN ) 0.005 % ophthalmic solution Place 1 drop into both eyes at bedtime.   levothyroxine  (SYNTHROID ) 125 MCG tablet Take 1 tablet (125 mcg total) by mouth daily before breakfast.   linaclotide  (LINZESS ) 290 MCG CAPS capsule Take 1 capsule (290 mcg total) by mouth daily before breakfast.   Netarsudil  Dimesylate (RHOPRESSA) 0.02 % SOLN Place 1 drop into both eyes at bedtime.   polyethylene glycol (MIRALAX  / GLYCOLAX ) 17 g packet Take 17 g by mouth 2 (two) times daily as needed.   [DISCONTINUED] dorzolamide -timolol  (COSOPT ) 2-0.5 % ophthalmic solution Place 1 drop into both eyes 2 (two) times daily.   No facility-administered encounter medications on file as of 06/07/2024.     SIGNIFICANT DIAGNOSTIC EXAMS  LABS REVIEWED;   09-26-23: wbc 6.8; hgb 10.7; hct 32.5; mcv 89.3 plt 155; glucose 93; bun 24; creat 0.88; k+ 4.5; na++ 132; ca 9.1 gfr >60  Review of Systems  Reason unable to perform ROS: did no participate.   Physical Exam Constitutional:      General: He is not in acute distress.    Appearance: He is well-developed. He is not diaphoretic.  Neck:     Thyroid : No thyromegaly.  Pulmonary:     Effort: No respiratory distress.  Abdominal:     General: There is no distension.     Comments: colostomy  Musculoskeletal:        General: Normal range of motion.  Skin:    General: Skin is warm and dry.  Neurological:     Mental Status: He is alert.     ASSESSMENT/ PLAN:  TODAY  Aortic atherosclerosis Neurocognitive deficits Colostomy status  Will continue current medications Will continue current plan of care Will continue to monitor his status.   Time spent with patient: 40 minutes: medications; behaviors; dietary     Barnie Seip NP [facility]   call [phone]

## 2024-06-19 ENCOUNTER — Encounter: Payer: Self-pay | Admitting: Adult Health

## 2024-06-19 ENCOUNTER — Non-Acute Institutional Stay (SKILLED_NURSING_FACILITY): Payer: Self-pay | Admitting: Adult Health

## 2024-06-19 DIAGNOSIS — I9589 Other hypotension: Secondary | ICD-10-CM

## 2024-06-19 DIAGNOSIS — K5981 Ogilvie syndrome: Secondary | ICD-10-CM

## 2024-06-19 DIAGNOSIS — D649 Anemia, unspecified: Secondary | ICD-10-CM | POA: Diagnosis not present

## 2024-06-19 NOTE — Progress Notes (Signed)
 Location:  [facility] Room Number: 52 W Place of Service:  SNF (31)   CODE STATUS: Full code  Allergies  Allergen Reactions   Ivp Dye [Iodinated Contrast Media] Other (See Comments)    Pt reports he has seizures.    Chief Complaint  Patient presents with   Medical Management of Chronic Issues          Other specified hypotension: Ogilvie syndrome/ colostomy status:  Iron  deficiency anemia due to chronic blood loss    HPI:  He is a 68 y.o. long term resident of this facility being seen for the management of his chronic illnesses: Other specified hypotension: Ogilvie syndrome/ colostomy status:  Iron  deficiency anemia due to chronic blood loss. He has allowed for an exam today. He continues to decline treatment; blood draws. He states that the blood draws cause his arm to spasm. He has been offered baclofen or ativan  prior to the blood draw and is considering this option.    Past Medical History:  Diagnosis Date   Chronic constipation    Deaf    Gout    Hypothyroidism    Legally blind    Ogilvie's syndrome    Pyogenic arthritis of right knee joint [facility])     Past Surgical History:  Procedure Laterality Date   EYE SURGERY Right    relieved fluid pressure and cleaned it out   KNEE ARTHROSCOPY Right 12/22/2015   Procedure: ARTHROSCOPY WASHOUT OF SEPTIC RIGHT KNEE;  Surgeon: Evalene JONETTA Chancy, MD;  Location: [facility] OR;  Service: Orthopedics;  Laterality: Right;   KNEE ARTHROSCOPY Right 06/24/2023   Procedure: ARTHROSCOPY KNEE  DEBRIDEMENT AND WASHOUT;  Surgeon: Cleotilde Barrio, MD;  Location: [facility] ORS;  Service: Orthopedics;  Laterality: Right;   KNEE SURGERY Left 2015/2016    Social History   Socioeconomic History   Marital status: Married    Spouse name: Not on file   Number of children: Not on file   Years of education: Not on file   Highest education level: Not on file  Occupational History   Not on file  Tobacco Use   Smoking status: Never    Smokeless tobacco: Never  Vaping Use   Vaping status: Never Used  Substance and Sexual Activity   Alcohol  use: No   Drug use: No   Sexual activity: Yes  Other Topics Concern   Not on file  Social History Narrative   Not on file   Social Drivers of Health   Financial Resource Strain: Not on file  Food Insecurity: Patient Declined (08/29/2023)   Hunger Vital Sign    Worried About Running Out of Food in the Last Year: Patient declined    Ran Out of Food in the Last Year: Patient declined  Transportation Needs: Patient Declined (08/29/2023)   PRAPARE - Administrator, Civil Service (Medical): Patient declined    Lack of Transportation (Non-Medical): Patient declined  Physical Activity: Not on file  Stress: Not on file  Social Connections: Unknown (10/10/2023)   Social Connection and Isolation Panel    Frequency of Communication with Friends and Family: More than three times a week    Frequency of Social Gatherings with Friends and Family: Patient unable to answer    Attends Religious Services: Patient unable to answer    Active Member of Clubs or Organizations: Patient unable to answer    Attends Banker Meetings: Patient unable to answer    Marital Status: Patient  unable to answer  Intimate Partner Violence: Patient Declined (08/29/2023)   Humiliation, Afraid, Rape, and Kick questionnaire    Fear of Current or Ex-Partner: Patient declined    Emotionally Abused: Patient declined    Physically Abused: Patient declined    Sexually Abused: Patient declined   Family History  Family history unknown: Yes      VITAL SIGNS BP 112/72   Pulse 75   Temp (!) 97 F (36.1 C)   Resp 16   Ht 6' 1 (1.854 m)   SpO2 98%   BMI 17.45 kg/m   Outpatient Encounter Medications as of 06/19/2024  Medication Sig   acetaminophen  (TYLENOL ) 650 MG CR tablet Take 650 mg by mouth every 6 (six) hours as needed.   brimonidine (ALPHAGAN) 0.15 % ophthalmic solution Place 1  drop into the left eye 3 (three) times daily.   Cholecalciferol 25 MCG (1000 UT) capsule Take 2,000 Units by mouth daily.   diclofenac  Sodium (VOLTAREN ) 1 % GEL Apply 2 g topically every 6 (six) hours as needed. Apply to right shoulder   dorzolamide -timolol  (COSOPT ) 22.3-6.8 MG/ML ophthalmic solution Place 1 drop into both eyes 2 (two) times daily.   ibuprofen  (ADVIL ) 200 MG tablet Take 200 mg by mouth every 6 (six) hours as needed.   latanoprost  (XALATAN ) 0.005 % ophthalmic solution Place 1 drop into both eyes at bedtime.   levothyroxine  (SYNTHROID ) 125 MCG tablet Take 1 tablet (125 mcg total) by mouth daily before breakfast.   linaclotide  (LINZESS ) 290 MCG CAPS capsule Take 1 capsule (290 mcg total) by mouth daily before breakfast.   Netarsudil  Dimesylate (RHOPRESSA) 0.02 % SOLN Place 1 drop into both eyes at bedtime.   polyethylene glycol (MIRALAX  / GLYCOLAX ) 17 g packet Take 17 g by mouth 2 (two) times daily as needed.   No facility-administered encounter medications on file as of 06/19/2024.     SIGNIFICANT DIAGNOSTIC EXAMS  LABS REVIEWED;   09-26-23: wbc 6.8; hgb 10.7; hct 32.5; mcv 89.3 plt 155; glucose 93; bun 24; creat 0.88; k+ 4.5; na++ 132; ca 9.1 gfr >60  Review of Systems  Reason unable to perform ROS: did not participate.   Physical Exam Constitutional:      General: He is not in acute distress.    Appearance: He is well-developed. He is not diaphoretic.  HENT:     Ears:     Comments: deaf Eyes:     Comments: Legally blind  Neck:     Thyroid : No thyromegaly.  Cardiovascular:     Rate and Rhythm: Normal rate and regular rhythm.     Heart sounds: Normal heart sounds.  Pulmonary:     Effort: Pulmonary effort is normal. No respiratory distress.     Breath sounds: Normal breath sounds.  Abdominal:     General: Bowel sounds are normal. There is no distension.     Palpations: Abdomen is soft.     Tenderness: There is no abdominal tenderness.     Comments: colostomy   Musculoskeletal:        General: Normal range of motion.     Cervical back: Neck supple.     Right lower leg: No edema.     Left lower leg: No edema.  Lymphadenopathy:     Cervical: No cervical adenopathy.  Skin:    General: Skin is warm and dry.  Neurological:     Mental Status: He is alert. Mental status is at baseline.  Psychiatric:  Mood and Affect: Mood normal.     ASSESSMENT/ PLAN:  TODAY  Other specified hypotension: is presently stable off medications  2. Ogilvie syndrome/ colostomy status: has as needed linzess  and miralax   3. Iron  deficiency anemia due to chronic blood loss: hgb 10.7   PREVIOUS   4. Non adherence to medical treatment: he is declining to take most medications; and is declining adl care; lab work    5. Chronic cerebral ischemia/neurocognitive deficits: does have a guardian; ischemia per ct 09-26-23.   6. Aortic atherosclerosis (ct 02-12-24) is not on statin  7. Increased intraocular pressure bilateral: will continue lumigan  to each eye nightly; cosopt  to both eyes twice daily; rhopressa to both eyes daily   8. Adjustment disorder with mixed anxiety and depressed mood/behavioral emotional and social difficulties: is presently off medications.   9. Hypothyroidism: will continue synthroid  125 mcg daily; will not allow for blood work to monitor his thyroid  status  10. Neurocognitive disorder: does have a guardian; was declared unable to make medical decisions.     Barnie Seip NP [facility]  call [phone]

## 2024-06-21 ENCOUNTER — Non-Acute Institutional Stay (SKILLED_NURSING_FACILITY): Payer: Self-pay | Admitting: Internal Medicine

## 2024-06-21 ENCOUNTER — Encounter: Payer: Self-pay | Admitting: Internal Medicine

## 2024-06-21 DIAGNOSIS — F4323 Adjustment disorder with mixed anxiety and depressed mood: Secondary | ICD-10-CM

## 2024-06-21 DIAGNOSIS — D649 Anemia, unspecified: Secondary | ICD-10-CM

## 2024-06-21 DIAGNOSIS — E43 Unspecified severe protein-calorie malnutrition: Secondary | ICD-10-CM

## 2024-06-21 NOTE — Progress Notes (Unsigned)
 NURSING HOME LOCATION:  [facility] ROOM NUMBER:  [id number]  CODE STATUS:  Full Code  PCP: Pa, [facility]   This is a nursing facility follow up visit  of chronic medical diagnoses to document compliance with Regulation 483.30 (c) in The Long Term Care Survey Manual Phase 2 which mandates caregiver visit ( visits can alternate among physician, PA or NP as per statutes) within 10 days of 30 days / 60 days/ 90 days post admission to SNF date  .  Interim medical record and care since last SNF visit was updated with review of diagnostic studies and change in clinical status since last visit were documented.  HPI: The interpreter for the visually and auditory challenged individuals was to meet me at 1:00 to help complete the interview and exam of the patient.  Unfortunately she did not show up. Karyn Meeks, LPN, a Psychologist, occupational member who has developed rapport with the patient tried to facilitate the interview and exam.  She has been very successful in gaining his confidence.  She states that she typically works with him after 6:00 at night; and he has been Adult nurse and cooperative.  She states that he will stay up all night and sleep all day which may explain some of the behaviors exhibited with other staff members during the day. As soon as he realized I was behind Ms. Meeks at the foot of his bed ;he began to howling indiscernibly and thrash around in the bed. Apparently he had been requesting branded Motrin  for left eye pain.  This had not been ordered as I have been unable to perform an ophthalmologic exam to ascertain the nature of any discomfort and unable to evaluate current status of previously documented anemia and its etiology.  Review of systems could not be completed as he never allowed me to ask any questions.  Physical exam:  Pertinent or positive findings: Obviously my exam was profoundly limited.  He tended to keep the left eye closed and his head turned to the left  for the most part.  I had no observation of the sclera or conjunctiva.  Grossly the right eye was absence of any active conjunctivitis or scleritis.  Dental health appeared to be good; I was able to visualize the teeth as he had his mouth open while yelling.  Unfortunately I could not examine his neck to ascertain whether there is thyroidomegaly or goiter in the context of uncorrected profound hypothyroidism.  Also I was unable to auscultate her heart or lungs.  Limbs revealed marked atrophy.  A boss is present over the right mid shin at the site of previous tib/fib fractures.  The skin over the lower extremities are shiny and exhibit some minimal keratotic change without exfoliation.  The toes are contracted. Mrs. Shaunna attempted to reassure him and actually physically tried comfort and  support him; but he continued to yell and thrash about , necessitating ending the exam.  See summary under each active problem in the Problem List with associated updated therapeutic plan: Normochromic normocytic anemia He has requested branded Motrin  for OS eye pain. He has not let me examine him & refuses lab update to assess anemia status.NSAIDS are contraindicated in a man of any age unless GI etiology of anemia eliminated.If he would let me assess his OS,etiology could be determined and appropriate therapy initiated. I'll ask Ms. Meeks to assess the OS and report her assessment when eyedrops are administered.  Adjustment disorder with mixed anxiety  and depressed mood He refused to see psychiatric specialist who works with sensory impaired individuals. It is unclear whether he is mentally incompetent; but his behavior suggests components of fear, paranoia & anger. He makes demands for interventions which may cause harm but will not enter into discussions of potential risk. Two examples : #1 branded Motrin  for OS discomfort w/o allowing eye exam in the context of anemia of unknown etiology. Anemia in male of any age is  suspect for gastritis , ulcer or colon cancer ( he has colostomy) #2 demand for crutches in the context of prolonged bed ridden state,dramatic limb atrophy , unknown status of vitamin D  deficiency, & prior tib/fib fractures of RLE. See behavior documented 06/21/2024 when I attempted to interview & examine him.  Protein calorie malnutrition (HCC) Although albumin & total protein were WNL 06/27/2023; he has dramatic limb atrophy.Protein supplement will be offered.

## 2024-06-21 NOTE — Patient Instructions (Signed)
 See assessment and plan under each diagnosis in the problem list and acutely for this visit

## 2024-06-22 NOTE — Assessment & Plan Note (Signed)
 Although albumin & total protein were WNL 06/27/2023; he has dramatic limb atrophy.Protein supplement will be offered.

## 2024-06-22 NOTE — Assessment & Plan Note (Signed)
 He has requested branded Motrin  for OS eye pain. He has not let me examine him & refuses lab update to assess anemia status.NSAIDS are contraindicated in a man of any age unless GI etiology of anemia eliminated.If he would let me assess his OS,etiology could be determined and appropriate therapy initiated. I'll ask Ms. Meeks to assess the OS and report her assessment when eyedrops are administered.

## 2024-06-22 NOTE — Assessment & Plan Note (Addendum)
 He refused to see psychiatric specialist who works with sensory impaired individuals. It is unclear whether he is mentally incompetent; but his behavior suggests components of fear, paranoia & anger. He makes demands for interventions which may cause harm but will not enter into discussions of potential risk. Two examples : #1 branded Motrin  for OS discomfort w/o allowing eye exam in the context of anemia of unknown etiology. Anemia in male of any age is suspect for gastritis , ulcer or colon cancer ( he has colostomy) #2 demand for crutches in the context of prolonged bed ridden state,dramatic limb atrophy , unknown status of vitamin D  deficiency, & prior tib/fib fractures of RLE. Were he to attempt ambulation with crutches , I fear fragility fracture related to osteopenia/osteoporosis. BMD indicated; but he is non-adherent. See behavior documented 06/21/2024 when I attempted to interview & examine him.

## 2024-06-28 ENCOUNTER — Emergency Department (HOSPITAL_COMMUNITY)

## 2024-06-28 ENCOUNTER — Inpatient Hospital Stay (HOSPITAL_COMMUNITY)
Admission: EM | Admit: 2024-06-28 | Discharge: 2024-07-04 | DRG: 335 | Disposition: A | Discharge status: Hospice | Source: Skilled Nursing Facility | Attending: Family Medicine | Admitting: Family Medicine

## 2024-06-28 ENCOUNTER — Inpatient Hospital Stay (HOSPITAL_COMMUNITY): Admitting: Anesthesiology

## 2024-06-28 ENCOUNTER — Encounter: Payer: Self-pay | Admitting: Internal Medicine

## 2024-06-28 ENCOUNTER — Encounter (HOSPITAL_COMMUNITY)
Admission: EM | Disposition: A | Discharge status: Hospice | Payer: Self-pay | Source: Skilled Nursing Facility | Attending: Family Medicine

## 2024-06-28 ENCOUNTER — Other Ambulatory Visit: Payer: Self-pay

## 2024-06-28 ENCOUNTER — Non-Acute Institutional Stay (SKILLED_NURSING_FACILITY): Admitting: Internal Medicine

## 2024-06-28 DIAGNOSIS — K565 Intestinal adhesions [bands], unspecified as to partial versus complete obstruction: Secondary | ICD-10-CM | POA: Diagnosis present

## 2024-06-28 DIAGNOSIS — Z558 Other problems related to education and literacy: Secondary | ICD-10-CM | POA: Diagnosis not present

## 2024-06-28 DIAGNOSIS — E559 Vitamin D deficiency, unspecified: Secondary | ICD-10-CM

## 2024-06-28 DIAGNOSIS — Z681 Body mass index (BMI) 19 or less, adult: Secondary | ICD-10-CM | POA: Diagnosis not present

## 2024-06-28 DIAGNOSIS — N189 Chronic kidney disease, unspecified: Secondary | ICD-10-CM | POA: Diagnosis present

## 2024-06-28 DIAGNOSIS — Z79890 Hormone replacement therapy (postmenopausal): Secondary | ICD-10-CM

## 2024-06-28 DIAGNOSIS — K562 Volvulus: Secondary | ICD-10-CM | POA: Diagnosis present

## 2024-06-28 DIAGNOSIS — Z659 Problem related to unspecified psychosocial circumstances: Secondary | ICD-10-CM

## 2024-06-28 DIAGNOSIS — R64 Cachexia: Secondary | ICD-10-CM | POA: Diagnosis present

## 2024-06-28 DIAGNOSIS — Z66 Do not resuscitate: Secondary | ICD-10-CM | POA: Diagnosis not present

## 2024-06-28 DIAGNOSIS — E43 Unspecified severe protein-calorie malnutrition: Secondary | ICD-10-CM | POA: Diagnosis not present

## 2024-06-28 DIAGNOSIS — R4689 Other symptoms and signs involving appearance and behavior: Secondary | ICD-10-CM

## 2024-06-28 DIAGNOSIS — H548 Legal blindness, as defined in USA: Secondary | ICD-10-CM | POA: Diagnosis present

## 2024-06-28 DIAGNOSIS — R17 Unspecified jaundice: Secondary | ICD-10-CM | POA: Diagnosis present

## 2024-06-28 DIAGNOSIS — E8809 Other disorders of plasma-protein metabolism, not elsewhere classified: Secondary | ICD-10-CM | POA: Diagnosis present

## 2024-06-28 DIAGNOSIS — Z9049 Acquired absence of other specified parts of digestive tract: Secondary | ICD-10-CM

## 2024-06-28 DIAGNOSIS — K56609 Unspecified intestinal obstruction, unspecified as to partial versus complete obstruction: Principal | ICD-10-CM | POA: Diagnosis present

## 2024-06-28 DIAGNOSIS — R451 Restlessness and agitation: Secondary | ICD-10-CM | POA: Diagnosis not present

## 2024-06-28 DIAGNOSIS — F4323 Adjustment disorder with mixed anxiety and depressed mood: Secondary | ICD-10-CM | POA: Diagnosis not present

## 2024-06-28 DIAGNOSIS — K567 Ileus, unspecified: Secondary | ICD-10-CM | POA: Diagnosis not present

## 2024-06-28 DIAGNOSIS — E876 Hypokalemia: Secondary | ICD-10-CM | POA: Diagnosis present

## 2024-06-28 DIAGNOSIS — H9193 Unspecified hearing loss, bilateral: Secondary | ICD-10-CM | POA: Diagnosis not present

## 2024-06-28 DIAGNOSIS — E039 Hypothyroidism, unspecified: Secondary | ICD-10-CM | POA: Diagnosis present

## 2024-06-28 DIAGNOSIS — Z515 Encounter for palliative care: Secondary | ICD-10-CM

## 2024-06-28 DIAGNOSIS — D696 Thrombocytopenia, unspecified: Secondary | ICD-10-CM | POA: Diagnosis not present

## 2024-06-28 DIAGNOSIS — J9601 Acute respiratory failure with hypoxia: Secondary | ICD-10-CM | POA: Diagnosis not present

## 2024-06-28 DIAGNOSIS — J690 Pneumonitis due to inhalation of food and vomit: Secondary | ICD-10-CM | POA: Diagnosis present

## 2024-06-28 DIAGNOSIS — R4589 Other symptoms and signs involving emotional state: Secondary | ICD-10-CM

## 2024-06-28 DIAGNOSIS — K9409 Other complications of colostomy: Secondary | ICD-10-CM

## 2024-06-28 DIAGNOSIS — H913 Deaf nonspeaking, not elsewhere classified: Secondary | ICD-10-CM | POA: Diagnosis present

## 2024-06-28 DIAGNOSIS — K5981 Ogilvie syndrome: Secondary | ICD-10-CM | POA: Diagnosis present

## 2024-06-28 DIAGNOSIS — Z781 Physical restraint status: Secondary | ICD-10-CM | POA: Diagnosis not present

## 2024-06-28 DIAGNOSIS — N181 Chronic kidney disease, stage 1: Secondary | ICD-10-CM | POA: Diagnosis present

## 2024-06-28 DIAGNOSIS — Z603 Acculturation difficulty: Secondary | ICD-10-CM | POA: Diagnosis present

## 2024-06-28 DIAGNOSIS — D500 Iron deficiency anemia secondary to blood loss (chronic): Secondary | ICD-10-CM

## 2024-06-28 DIAGNOSIS — Z7189 Other specified counseling: Secondary | ICD-10-CM | POA: Diagnosis not present

## 2024-06-28 DIAGNOSIS — Z789 Other specified health status: Secondary | ICD-10-CM | POA: Diagnosis not present

## 2024-06-28 DIAGNOSIS — H9190 Unspecified hearing loss, unspecified ear: Secondary | ICD-10-CM

## 2024-06-28 DIAGNOSIS — Z79899 Other long term (current) drug therapy: Secondary | ICD-10-CM | POA: Diagnosis not present

## 2024-06-28 DIAGNOSIS — Z933 Colostomy status: Secondary | ICD-10-CM | POA: Diagnosis not present

## 2024-06-28 DIAGNOSIS — Z91041 Radiographic dye allergy status: Secondary | ICD-10-CM

## 2024-06-28 DIAGNOSIS — R571 Hypovolemic shock: Secondary | ICD-10-CM | POA: Diagnosis not present

## 2024-06-28 HISTORY — PX: LYSIS OF ADHESION: SHX5961

## 2024-06-28 HISTORY — PX: LAPAROTOMY: SHX154

## 2024-06-28 LAB — COMPREHENSIVE METABOLIC PANEL WITH GFR
ALT: 13 U/L (ref 0–44)
AST: 21 U/L (ref 15–41)
Albumin: 4.1 g/dL (ref 3.5–5.0)
Alkaline Phosphatase: 123 U/L (ref 38–126)
Anion gap: 14 (ref 5–15)
BUN: 22 mg/dL (ref 8–23)
CO2: 26 mmol/L (ref 22–32)
Calcium: 10.1 mg/dL (ref 8.9–10.3)
Chloride: 99 mmol/L (ref 98–111)
Creatinine, Ser: 0.85 mg/dL (ref 0.61–1.24)
GFR, Estimated: >60 mL/min (ref >60)
Glucose, Bld: 189 mg/dL — ABNORMAL HIGH (ref 70–99)
Potassium: 4.2 mmol/L (ref 3.5–5.1)
Sodium: 139 mmol/L (ref 135–145)
Total Bilirubin: 0.9 mg/dL (ref 0.0–1.2)
Total Protein: 8.6 g/dL — ABNORMAL HIGH (ref 6.5–8.1)

## 2024-06-28 LAB — CBC
HCT: 42.8 % (ref 39.0–52.0)
Hemoglobin: 13.8 g/dL (ref 13.0–17.0)
MCH: 28.7 pg (ref 26.0–34.0)
MCHC: 32.2 g/dL (ref 30.0–36.0)
MCV: 89 fL (ref 80.0–100.0)
Platelets: 232 K/uL (ref 150–400)
RBC: 4.81 MIL/uL (ref 4.22–5.81)
RDW: 14.5 % (ref 11.5–15.5)
WBC: 7.6 K/uL (ref 4.0–10.5)
nRBC: 0 % (ref 0.0–0.2)

## 2024-06-28 LAB — MRSA NEXT GEN BY PCR, NASAL: MRSA by PCR Next Gen: NOT DETECTED

## 2024-06-28 LAB — DIFFERENTIAL
Abs Immature Granulocytes: 0.03 K/uL (ref 0.00–0.07)
Basophils Absolute: 0 K/uL (ref 0.0–0.1)
Basophils Relative: 0 %
Eosinophils Absolute: 0 K/uL (ref 0.0–0.5)
Eosinophils Relative: 0 %
Immature Granulocytes: 0 %
Lymphocytes Relative: 10 %
Lymphs Abs: 0.7 K/uL (ref 0.7–4.0)
Monocytes Absolute: 0.4 K/uL (ref 0.1–1.0)
Monocytes Relative: 5 %
Neutro Abs: 6.4 K/uL (ref 1.7–7.7)
Neutrophils Relative %: 85 %

## 2024-06-28 LAB — BLOOD GAS, ARTERIAL
Acid-Base Excess: 3 mmol/L — ABNORMAL HIGH (ref 0.0–2.0)
Bicarbonate: 24.2 mmol/L (ref 20.0–28.0)
O2 Saturation: 100 %
Patient temperature: 37
pCO2 arterial: 27 mmHg — ABNORMAL LOW (ref 32–48)
pH, Arterial: 7.56 — ABNORMAL HIGH (ref 7.35–7.45)
pO2, Arterial: 113 mmHg — ABNORMAL HIGH (ref 83–108)

## 2024-06-28 LAB — T4, FREE: Free T4: 0.94 ng/dL (ref 0.61–1.12)

## 2024-06-28 LAB — TSH: TSH: 17.932 u[IU]/mL — ABNORMAL HIGH (ref 0.350–4.500)

## 2024-06-28 LAB — LIPASE, BLOOD: Lipase: 33 U/L (ref 11–51)

## 2024-06-28 LAB — GLUCOSE, CAPILLARY: Glucose-Capillary: 178 mg/dL — ABNORMAL HIGH (ref 70–99)

## 2024-06-28 LAB — LACTIC ACID, PLASMA: Lactic Acid, Venous: 1.7 mmol/L (ref 0.5–1.9)

## 2024-06-28 SURGERY — LAPAROTOMY, EXPLORATORY
Anesthesia: General | Site: Abdomen

## 2024-06-28 MED ORDER — BUPIVACAINE HCL (PF) 0.5 % IJ SOLN: INTRAMUSCULAR | Status: DC | PRN

## 2024-06-28 MED ORDER — ONDANSETRON HCL 4 MG/2ML IJ SOLN
4.0000 mg | Freq: Once | INTRAMUSCULAR | Status: AC
  Administered 2024-06-28: 4 mg via INTRAVENOUS
  Filled 2024-06-28: qty 2

## 2024-06-28 MED ORDER — PROPOFOL 10 MG/ML IV BOLUS
INTRAVENOUS | Status: DC | PRN
  Administered 2024-06-28: 100 mg via INTRAVENOUS

## 2024-06-28 MED ORDER — MIDAZOLAM HCL 2 MG/2ML IJ SOLN: 2.0000 mg | INTRAMUSCULAR | Status: DC | PRN

## 2024-06-28 MED ORDER — ONDANSETRON HCL 4 MG/2ML IJ SOLN
4.0000 mg | Freq: Four times a day (QID) | INTRAMUSCULAR | Status: DC | PRN
  Administered 2024-07-02 – 2024-07-04 (×3): 4 mg via INTRAVENOUS
  Filled 2024-06-28 (×3): qty 2

## 2024-06-28 MED ORDER — ROCURONIUM BROMIDE 100 MG/10ML IV SOLN
INTRAVENOUS | Status: DC | PRN
  Administered 2024-06-28: 100 mg via INTRAVENOUS

## 2024-06-28 MED ORDER — CEFAZOLIN SODIUM-DEXTROSE 2-3 GM-%(50ML) IV SOLR
INTRAVENOUS | Status: DC | PRN
Start: 2024-06-28 — End: 2024-06-28
  Administered 2024-06-28: 2 g via INTRAVENOUS

## 2024-06-28 MED ORDER — ROCURONIUM BROMIDE 10 MG/ML (PF) SYRINGE
PREFILLED_SYRINGE | INTRAVENOUS | Status: AC
  Filled 2024-06-28: qty 10

## 2024-06-28 MED ORDER — FENTANYL CITRATE (PF) 100 MCG/2ML IJ SOLN
INTRAMUSCULAR | Status: AC
  Filled 2024-06-28: qty 2

## 2024-06-28 MED ORDER — MIDAZOLAM HCL 2 MG/2ML IJ SOLN
INTRAMUSCULAR | Status: AC
  Filled 2024-06-28: qty 2

## 2024-06-28 MED ORDER — HYDRALAZINE HCL 20 MG/ML IJ SOLN: 10.0000 mg | INTRAMUSCULAR | Status: DC | PRN

## 2024-06-28 MED ORDER — INSULIN ASPART 100 UNIT/ML IJ SOLN
0.0000 [IU] | INTRAMUSCULAR | Status: DC
  Administered 2024-06-28: 2 [IU] via SUBCUTANEOUS
  Administered 2024-06-29: 1 [IU] via SUBCUTANEOUS
  Administered 2024-06-29: 2 [IU] via SUBCUTANEOUS

## 2024-06-28 MED ORDER — SODIUM CHLORIDE 0.9 % IV BOLUS
1000.0000 mL | Freq: Once | INTRAVENOUS | Status: AC
  Administered 2024-06-28: 1000 mL via INTRAVENOUS

## 2024-06-28 MED ORDER — BUPIVACAINE HCL (PF) 0.5 % IJ SOLN
INTRAMUSCULAR | Status: AC
  Filled 2024-06-28: qty 30

## 2024-06-28 MED ORDER — PHENYLEPHRINE HCL-NACL 20-0.9 MG/250ML-% IV SOLN
INTRAVENOUS | Status: AC
Start: 2024-06-28 — End: 2024-06-28
  Filled 2024-06-28: qty 250

## 2024-06-28 MED ORDER — ONDANSETRON 4 MG PO TBDP
4.0000 mg | ORAL_TABLET | Freq: Once | ORAL | Status: DC | PRN
  Filled 2024-06-28: qty 1

## 2024-06-28 MED ORDER — DEXMEDETOMIDINE HCL IN NACL 400 MCG/100ML IV SOLN
0.0000 ug/kg/h | INTRAVENOUS | Status: DC
  Administered 2024-06-28: 0.1 ug/kg/h via INTRAVENOUS
  Administered 2024-06-29: 1 ug/kg/h via INTRAVENOUS
  Administered 2024-06-29 (×2): 1.2 ug/kg/h via INTRAVENOUS
  Administered 2024-06-30: 0.4 ug/kg/h via INTRAVENOUS
  Administered 2024-06-30: 1.2 ug/kg/h via INTRAVENOUS
  Administered 2024-06-30: 0.7 ug/kg/h via INTRAVENOUS
  Administered 2024-07-01: 1 ug/kg/h via INTRAVENOUS
  Administered 2024-07-02: 0.4 ug/kg/h via INTRAVENOUS
  Administered 2024-07-02: 0.7 ug/kg/h via INTRAVENOUS
  Filled 2024-06-28 (×10): qty 100

## 2024-06-28 MED ORDER — ONDANSETRON HCL 4 MG/2ML IJ SOLN
4.0000 mg | Freq: Once | INTRAMUSCULAR | Status: AC | PRN
  Administered 2024-06-28: 4 mg via INTRAVENOUS
  Filled 2024-06-28: qty 2

## 2024-06-28 MED ORDER — CEFAZOLIN SODIUM-DEXTROSE 2-4 GM/100ML-% IV SOLN
INTRAVENOUS | Status: AC
  Filled 2024-06-28: qty 100

## 2024-06-28 MED ORDER — CHLORHEXIDINE GLUCONATE CLOTH 2 % EX PADS
6.0000 | MEDICATED_PAD | Freq: Every day | CUTANEOUS | Status: DC
Start: 2024-06-29 — End: 2024-07-04
  Administered 2024-06-29 – 2024-07-01 (×3): 6 via TOPICAL

## 2024-06-28 MED ORDER — ONDANSETRON 4 MG PO TBDP: 4.0000 mg | ORAL_TABLET | Freq: Four times a day (QID) | ORAL | Status: DC | PRN

## 2024-06-28 MED ORDER — LACTATED RINGERS IV SOLN
INTRAVENOUS | Status: DC | PRN
Start: 2024-06-28 — End: 2024-06-28

## 2024-06-28 MED ORDER — FENTANYL CITRATE (PF) 100 MCG/2ML IJ SOLN
INTRAMUSCULAR | Status: DC | PRN
  Administered 2024-06-28 (×2): 50 ug via INTRAVENOUS

## 2024-06-28 MED ORDER — SUCCINYLCHOLINE CHLORIDE 200 MG/10ML IV SOSY
PREFILLED_SYRINGE | INTRAVENOUS | Status: DC | PRN
  Administered 2024-06-28: 100 mg via INTRAVENOUS

## 2024-06-28 MED ORDER — SUCCINYLCHOLINE CHLORIDE 200 MG/10ML IV SOSY
PREFILLED_SYRINGE | INTRAVENOUS | Status: AC
  Filled 2024-06-28: qty 10

## 2024-06-28 MED ORDER — MIDAZOLAM HCL 5 MG/5ML IJ SOLN
INTRAMUSCULAR | Status: DC | PRN
  Administered 2024-06-28: 2 mg via INTRAVENOUS

## 2024-06-28 MED ORDER — LACTATED RINGERS IV SOLN: INTRAVENOUS | Status: DC

## 2024-06-28 MED ORDER — SODIUM CHLORIDE 0.9 % IR SOLN
Status: DC | PRN
  Administered 2024-06-28 (×2): 1000 mL

## 2024-06-28 MED ORDER — ENOXAPARIN SODIUM 40 MG/0.4ML IJ SOSY
40.0000 mg | PREFILLED_SYRINGE | INTRAMUSCULAR | Status: DC
  Administered 2024-06-28 – 2024-07-03 (×5): 40 mg via SUBCUTANEOUS
  Filled 2024-06-28 (×5): qty 0.4

## 2024-06-28 MED ORDER — PROPOFOL 10 MG/ML IV BOLUS
INTRAVENOUS | Status: AC
Start: 2024-06-28 — End: 2024-06-28
  Filled 2024-06-28: qty 20

## 2024-06-28 MED ORDER — FENTANYL 2500MCG IN NS 250ML (10MCG/ML) PREMIX INFUSION
INTRAVENOUS | Status: AC
  Administered 2024-06-28: 50 ug/h via INTRAVENOUS
  Filled 2024-06-28: qty 250

## 2024-06-28 MED ORDER — POLYETHYLENE GLYCOL 3350 17 G PO PACK: 17.0000 g | PACK | Freq: Every day | ORAL | Status: DC | PRN

## 2024-06-28 MED ORDER — DOCUSATE SODIUM 100 MG PO CAPS: 100.0000 mg | ORAL_CAPSULE | Freq: Two times a day (BID) | ORAL | Status: DC | PRN

## 2024-06-28 MED ORDER — FENTANYL 2500MCG IN NS 250ML (10MCG/ML) PREMIX INFUSION
0.0000 ug/h | INTRAVENOUS | Status: DC
  Administered 2024-06-28: 50 ug/h via INTRAVENOUS
  Administered 2024-06-29: 300 ug/h via INTRAVENOUS
  Administered 2024-06-29: 400 ug/h via INTRAVENOUS
  Administered 2024-06-30: 150 ug/h via INTRAVENOUS
  Administered 2024-06-30: 375 ug/h via INTRAVENOUS
  Administered 2024-06-30: 380 ug/h via INTRAVENOUS
  Filled 2024-06-28 (×5): qty 250

## 2024-06-28 MED ORDER — PANTOPRAZOLE SODIUM 40 MG IV SOLR
40.0000 mg | Freq: Two times a day (BID) | INTRAVENOUS | Status: DC
  Administered 2024-06-28 – 2024-07-03 (×11): 40 mg via INTRAVENOUS
  Filled 2024-06-28 (×12): qty 10

## 2024-06-28 MED ORDER — PHENYLEPHRINE 80 MCG/ML (10ML) SYRINGE FOR IV PUSH (FOR BLOOD PRESSURE SUPPORT)
PREFILLED_SYRINGE | INTRAVENOUS | Status: AC
  Filled 2024-06-28: qty 10

## 2024-06-28 MED ORDER — BISACODYL 10 MG RE SUPP
RECTAL | Status: DC | PRN
  Administered 2024-06-28: 10 mg via RECTAL

## 2024-06-28 SURGICAL SUPPLY — 31 items
CLOTH BEACON ORANGE TIMEOUT ST (SAFETY) ×1 IMPLANT
COVER LIGHT HANDLE (MISCELLANEOUS) IMPLANT
DRAPE WARM FLUID 44X44 (DRAPES) ×1 IMPLANT
DRSG OPSITE POSTOP 4X10 (GAUZE/BANDAGES/DRESSINGS) IMPLANT
ELECT BLADE 6 FLAT ULTRCLN (ELECTRODE) IMPLANT
ELECTRODE REM PT RTRN 9FT ADLT (ELECTROSURGICAL) ×1 IMPLANT
GLOVE BIOGEL PI IND STRL 6.5 (GLOVE) ×1 IMPLANT
GLOVE BIOGEL PI IND STRL 7.0 (GLOVE) ×2 IMPLANT
GLOVE SURG SS PI 6.5 STRL IVOR (GLOVE) ×2 IMPLANT
GOWN STRL REUS W/TWL LRG LVL3 (GOWN DISPOSABLE) ×3 IMPLANT
HANDLE SUCTION POOLE (INSTRUMENTS) ×1 IMPLANT
INST SET MAJOR GENERAL (KITS) ×1 IMPLANT
KIT TURNOVER KIT A (KITS) ×1 IMPLANT
MANIFOLD NEPTUNE II (INSTRUMENTS) ×1 IMPLANT
NDL HYPO 18GX1.5 BLUNT FILL (NEEDLE) ×1 IMPLANT
NDL HYPO 21X1.5 SAFETY (NEEDLE) ×1 IMPLANT
NEEDLE HYPO 18GX1.5 BLUNT FILL (NEEDLE) ×1 IMPLANT
NEEDLE HYPO 21X1.5 SAFETY (NEEDLE) ×1 IMPLANT
NS IRRIG 1000ML POUR BTL (IV SOLUTION) ×2 IMPLANT
PACK MAJOR ABDOMINAL (CUSTOM PROCEDURE TRAY) ×1 IMPLANT
PAD ARMBOARD POSITIONER FOAM (MISCELLANEOUS) ×1 IMPLANT
PENCIL SMOKE EVACUATOR COATED (MISCELLANEOUS) ×1 IMPLANT
POSITIONER HEAD 8X9X4 ADT (SOFTGOODS) ×1 IMPLANT
RETRACTOR WND ALEXIS-O 25 LRG (MISCELLANEOUS) IMPLANT
SET BASIN LINEN APH (SET/KITS/TRAYS/PACK) ×1 IMPLANT
SPONGE T-LAP 18X18 ~~LOC~~+RFID (SPONGE) ×1 IMPLANT
STAPLER VISISTAT (STAPLE) ×1 IMPLANT
SUT PDS AB CT VIOLET #0 27IN (SUTURE) ×2 IMPLANT
SUT SILK 3 0 SH CR/8 (SUTURE) ×1 IMPLANT
SYR 30ML LL (SYRINGE) ×2 IMPLANT
TRAY FOLEY MTR SLVR 16FR STAT (SET/KITS/TRAYS/PACK) ×1 IMPLANT

## 2024-06-28 NOTE — Transfer of Care (Signed)
 Immediate Anesthesia Transfer of Care Note  Patient: Martin [name]  Procedure(s) Performed: LAPAROTOMY, EXPLORATORY, LYSIS OF ADHESIONS  Patient Location: SICU  Anesthesia Type:General  Level of Consciousness: Patient remains intubated per anesthesia plan  Airway & Oxygen Therapy: Patient remains intubated per anesthesia plan and Patient placed on Ventilator (see vital sign flow sheet for setting)  Post-op Assessment: Report given to RN and Post -op Vital signs reviewed and stable  Post vital signs: Reviewed and stable  Last Vitals:  Vitals Value Taken Time  BP 182/101 06/28/24 21:34  Temp    Pulse 124 06/28/24 21:36  Resp 20 06/28/24 21:36  SpO2 98 % 06/28/24 21:36  Vitals shown include unfiled device data.  Last Pain:  Vitals:   06/28/24 1240  TempSrc: Axillary         Complications: No notable events documented.

## 2024-06-28 NOTE — Progress Notes (Signed)
 [facility]  Patient tolerated surgery without issue. No family/guardian present to update postoperatively.  Plan: -Transfer to ICU intubated, hemodynamically stable -Ventilator per PCCM/hospitalist -Will potential attempt extubation tomorrow pending patient cooperation -Will require postop CXR to confirm ET tube location -NG to LIS, position confirmed intra-op -NPO -IVF -Maintain foley catheter -Will plan to initiate bowel regimen tomorrow -AM labs -Appreciate hospitalist and PCCM recommendations  Dorothyann Brittle, DO [facility] [address] [place], [place] [address] [phone] (office)

## 2024-06-28 NOTE — Assessment & Plan Note (Addendum)
 Abdomen distended with decreased bowel sounds with quasi ileus bowel sounds & extrusion of ostomy border. Imaging critical to define process.

## 2024-06-28 NOTE — ED Triage Notes (Signed)
 Pt BIB [facility] from [facility] for N/V. Pts colostomy is enlarged and has not been having output. Vomiting bile and abdomen is distended.

## 2024-06-28 NOTE — Assessment & Plan Note (Signed)
 Vitamin D ,25 OH level can be checked. Ideally BMD assessment could define severity of any osteoporosis in context of prior tib/fib fractures.

## 2024-06-28 NOTE — ED Notes (Signed)
 EDP aware that pt is blind and deaf. Per EDP, if pt is still refusing after speaking with the interpretor, pt will be signed out [facility]

## 2024-06-28 NOTE — Consult Note (Signed)
 [facility] Consult  Reason for Consult: small bowel obstruction, concern  Referring Physician: Dr. Towana  Chief Complaint   Emesis     HPI: Martin [name] is a 68 y.o. male who presents to the hospital with worsening nausea, vomiting, and abdominal pain over the last 24 hours.  Patient is legally blind and deaf, so an American sign language interpreter was used to obtain history.  He has not been eating much and has had decreased output from his colostomy.  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  Patient believes that he has had surgery for a small bowel obstruction in the past.  He also had some type of procedure done to his colorectal region which was separate from his surgery where he had the colostomy created.  In the emergency department, he was noted to be hemodynamically stable.  Blood work was within normal limits.  He underwent a CT of the abdomen and pelvis which demonstrated a high-grade small bowel obstruction with transition point in the central abdomen and swirling of the mesentery concerning for internal hernia.  Past Medical History:  Diagnosis Date   Chronic constipation    Deaf    Gout    Hypothyroidism    Legally blind    Ogilvie's syndrome    Pyogenic arthritis of right knee joint [facility])     Past Surgical History:  Procedure Laterality Date   EYE SURGERY Right    relieved fluid pressure and cleaned it out   KNEE ARTHROSCOPY Right 12/22/2015   Procedure: ARTHROSCOPY WASHOUT OF SEPTIC RIGHT KNEE;  Surgeon: Evalene JONETTA Chancy, MD;  Location: [facility] OR;  Service: Orthopedics;  Laterality: Right;   KNEE ARTHROSCOPY Right 06/24/2023   Procedure: ARTHROSCOPY KNEE  DEBRIDEMENT AND WASHOUT;  Surgeon: Cleotilde Barrio, MD;  Location: [facility] ORS;  Service: Orthopedics;  Laterality: Right;   KNEE SURGERY Left 2015/2016    Family History   Family history unknown: Yes    Social History   Tobacco Use   Smoking status: Never   Smokeless tobacco: Never  Vaping Use   Vaping status: Never Used  Substance Use Topics   Alcohol  use: No   Drug use: No    Medications: I have reviewed the patient's current medications.  Allergies  Allergen Reactions   Ivp Dye [Iodinated Contrast Media] Other (See Comments)    Pt reports he has seizures.     ROS:  Pertinent items are noted in HPI.  Blood pressure 125/77, pulse (!) 102, temperature 99.2 F (37.3 C), temperature source Axillary, resp. rate 15, height 6' 1 (1.854 m), weight 60 kg, SpO2 95%. Physical Exam Vitals reviewed.  Constitutional:      Appearance: He is ill-appearing.     Comments: uncomfortable  Eyes:     Comments: Significant visual impairment  Cardiovascular:     Rate and Rhythm: Normal rate.  Pulmonary:     Effort: Pulmonary effort is normal.  Abdominal:     Comments: Abdomen soft, moderate distention, no percussion tenderness, diffuse tenderness to palpation; no rigidity, guarding, or rebound tenderness; left-sided ostomy pink with slight prolapse, no stool in bag, midline cicatrix noted  Musculoskeletal:     Cervical back: Normal range of motion.  Skin:    General: Skin is warm and dry.  Neurological:     Mental Status: He is alert. Mental status is at baseline.     Results: Results for  orders placed or performed during the hospital encounter of 06/28/24 (from the past 48 hours)  Lipase, blood     Status: None   Collection Time: 06/28/24 12:13 PM  Result Value Ref Range   Lipase 33 11 - 51 U/L    Comment: Performed at [facility], [address] [place], [place] [address]  Comprehensive metabolic panel     Status: Abnormal   Collection Time: 06/28/24 12:13 PM  Result Value Ref Range   Sodium 139 135 - 145 mmol/L   Potassium 4.2 3.5 - 5.1 mmol/L   Chloride 99 98 - 111 mmol/L   CO2 26 22 - 32 mmol/L   Glucose, Bld 189 (H) 70 - 99 mg/dL     Comment: Glucose reference range applies only to samples taken after fasting for at least 8 hours.   BUN 22 8 - 23 mg/dL   Creatinine, Ser 9.14 0.61 - 1.24 mg/dL   Calcium 89.8 8.9 - 89.6 mg/dL   Total Protein 8.6 (H) 6.5 - 8.1 g/dL   Albumin 4.1 3.5 - 5.0 g/dL   AST 21 15 - 41 U/L   ALT 13 0 - 44 U/L   Alkaline Phosphatase 123 38 - 126 U/L   Total Bilirubin 0.9 0.0 - 1.2 mg/dL   GFR, Estimated >39 >39 mL/min    Comment: (NOTE) Calculated using the CKD-EPI Creatinine Equation (2021)    Anion gap 14 5 - 15    Comment: Performed at [facility], [address] [place], [place] [address]  CBC     Status: None   Collection Time: 06/28/24 12:13 PM  Result Value Ref Range   WBC 7.6 4.0 - 10.5 K/uL   RBC 4.81 4.22 - 5.81 MIL/uL   Hemoglobin 13.8 13.0 - 17.0 g/dL   HCT 57.1 60.9 - 47.9 %   MCV 89.0 80.0 - 100.0 fL   MCH 28.7 26.0 - 34.0 pg   MCHC 32.2 30.0 - 36.0 g/dL   RDW 85.4 88.4 - 84.4 %   Platelets 232 150 - 400 K/uL   nRBC 0.0 0.0 - 0.2 %    Comment: Performed at [facility], [address], [place], [place] [address]  Differential     Status: None   Collection Time: 06/28/24 12:13 PM  Result Value Ref Range   Neutrophils Relative % 85 %   Neutro Abs 6.4 1.7 - 7.7 K/uL   Lymphocytes Relative 10 %   Lymphs Abs 0.7 0.7 - 4.0 K/uL   Monocytes Relative 5 %   Monocytes Absolute 0.4 0.1 - 1.0 K/uL   Eosinophils Relative 0 %   Eosinophils Absolute 0.0 0.0 - 0.5 K/uL   Basophils Relative 0 %   Basophils Absolute 0.0 0.0 - 0.1 K/uL   Immature Granulocytes 0 %   Abs Immature Granulocytes 0.03 0.00 - 0.07 K/uL    Comment: Performed at [facility], [address] [place], [place] [address]    CT ABDOMEN PELVIS WO CONTRAST Result Date: 06/28/2024 CLINICAL DATA:  Abdominal pain with nausea and vomiting. EXAM: CT ABDOMEN AND PELVIS WITHOUT CONTRAST TECHNIQUE: Multidetector CT imaging of the abdomen and pelvis was performed following the standard protocol without IV contrast.  RADIATION DOSE REDUCTION: This exam was performed according to the departmental dose-optimization program which includes automated exposure control, adjustment of the mA and/or kV according to patient size and/or use of iterative reconstruction technique. COMPARISON:  CT abdomen and pelvis 02/12/2024. FINDINGS: Lower chest: There is atelectasis in the  left lung base. There is mild elevation of the left hemidiaphragm. Hepatobiliary: No focal liver abnormality is seen. No gallstones, gallbladder wall thickening, or biliary dilatation. Pancreas: Unremarkable. No pancreatic ductal dilatation or surrounding inflammatory changes. Spleen: Normal in size without focal abnormality. Adrenals/Urinary Tract: Adrenal glands are unremarkable. Kidneys are normal, without renal calculi, focal lesion, or hydronephrosis. Bladder is unremarkable. Stomach/Bowel: The stomach is dilated with large air-fluid level. Jejunal loops are dilated measuring up to 4 cm with air-fluid levels. Transition point is seen in the central abdomen image 4/66. Distal small bowel is decompressed. Left lower quadrant colostomy is again seen. There is a large amount of stool in the distal colon to the level of the ostomy. The appendix is not seen. There is no mesenteric edema, free air or pneumatosis. Vascular/Lymphatic: Aortic atherosclerosis. No enlarged abdominal or pelvic lymph nodes. Reproductive: Prostate gland is mildly enlarged. Other: No abdominal wall hernia or abnormality. No abdominopelvic ascites. Musculoskeletal: There are changes of avascular necrosis in the bilateral femoral heads, left greater than right. No evidence for femoral head collapse. Findings are unchanged. IMPRESSION: 1. High grade small-bowel obstruction with transition point in the central abdomen. Swirling of mesentery at this level may be related to internal hernia. 2. Left lower quadrant colostomy. 3. Large amount of stool in the distal colon to the level of the ostomy. 4.  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 5. Aortic atherosclerosis. Aortic Atherosclerosis (ICD10-I70.0). Electronically Signed   By: Greig Pique M.D.   On: 06/28/2024 17:14     Assessment & Plan:  Garan Frappier is a 68 y.o. male who presents to the hospital with nausea, vomiting, and abdominal pain for the last 24 hours.  Imaging and blood work evaluated by myself.  -Upon my evaluation of the imaging, he has a significant distention of proximal small bowel and stomach, and I do see the swirling of the mesentery.  I suspect this is likely secondary to adhesive disease given his multiple abdominal surgeries in the past -The risk and benefits of exploratory laparotomy, lysis of adhesions,'s possible bowel resection were discussed including but not limited to bleeding, infection, injury to surrounding structures, need for additional procedures, aspiration, and possible need for prolonged intubation.  After careful consideration, Martin [name] has decided to proceed with surgery.  -Patient does have a legal guardian, and I spoke with the on call social worker, Martin Ply.  She spoke with her director who gave medical consent given the emergent nature of the patient's case.  Her phone number is [phone] -Plan to proceed to the OR today -Prophylactic Ancef  ordered -IVF -NG tube ordered, will plan to place on LIS -NPO -Discussed case with Dr. Barbra.  Given the patient's significant sensory limitations, we will plan to keep the patient intubated overnight and possibly for an additional day or 2 if there is concern that the patient will pull at his IVs and other lines and not allow replacement -Appreciate hospitalist recommendations  All questions were answered to the satisfaction of the patient and social worker.  Note: Portions of this report may have been transcribed using voice recognition software. Every effort has been made to ensure accuracy; however, inadvertent computerized  transcription errors may still be present.   -- Dorothyann Brittle, DO [facility] [address] [place] [employer] [place], [place] [address] [phone] (office)

## 2024-06-28 NOTE — Op Note (Signed)
 [facility] Operative Note  06/28/24  Preoperative Diagnosis: Small bowel obstruction, concern for internal hernia   Postoperative Diagnosis: Small bowel obstruction with volvulus secondary to adhesions   Procedure(s) Performed: Exploratory laparotomy, lysis of adhesions   Surgeon: Dorothyann Brittle, DO    Assistants: Levon Free, RNFA   Anesthesia: General endotracheal   Anesthesiologist: Kendell Yvonna PARAS, MD    Specimens: None   Estimated Blood Loss: 5 cc   Blood Replacement: None    Complications: None   Wound Class: Clean contaminated   Operative Indications: Patient is a 68 year old male who presented to the hospital with a 1 day history of worsening nausea, vomiting, and abdominal pain.  He underwent a CT scan which demonstrated concern for a high-grade small bowel obstruction and a possible internal hernia.  Patient is deaf and legally blind and uses an ASL interpreter.  Patient is agreeable to surgery.  He does have a legal guardian with [facility], who gave emergent consent for the procedure.  All risks and benefits of performing this procedure were discussed with the patient including pain, infection, bleeding, damage to the surrounding structures, and need for more procedures or surgery. The patient expressed via ASL interpreter understanding of the procedure, all questions were sought and answered, and consent was obtained.  Findings: -3 adhesive bands causing small bowel to volvulize and resulting with small bowel obstruction -Hemostasis noted at the completion of the case   Procedure: The patient was taken to the operating room and placed supine. General endotracheal anesthesia was induced. Intravenous antibiotics were administered per protocol.  An nasogastric tube positioned to decompress the stomach. The abdomen was prepared and draped in the usual sterile fashion.  The left sided colostomy was covered with Ioban to separate it from the  sterile field.  A time-out was completed verifying correct patient, procedure, site, positioning, and implant(s) and/or special equipment prior to beginning this procedure.  A vertical midline incision was made from just above the previous laparotomy incision site down to below the umbilicus.  This was deepened through the subcutaneous tissues, and hemostasis was achieved with electrocautery.  The linea alba was identified, incised, and the peritoneal cavity was entered with care.  There were no adhesions to the anterior abdominal wall.  The small bowel was eviscerated and noted to have significant dilation proximally.  There were 3 different adhesive bands noted, causing the small bowel to volvulize.  These 3 bands were taken down with Metzenbaum scissors.  The small bowel was then run from the ligament of Treitz to the ileocecal valve, and all small bowel was noted to be viable.  The areas where the adhesive bands were taken down were oversewn with 3-0 silk in a Lembert fashion.  NG tube was confirmed to be in appropriate position within the stomach.  The contents of the small bowel were then milked back towards the stomach.  The small bowel was then run one more time without abnormalities noted.  The bowel was returned within the abdomen.  The abdomen was then irrigated with warm saline.  The fascia was then closed with 0 looped PDS from the superior and inferior aspects of the incision.  The skin was closed with skin staples, and a honeycomb dressing was applied.  Attention was then turned to the ostomy, where a new ostomy appliance was placed.  A Dulcolax suppository was also inserted into the colostomy.  Final inspection revealed acceptable hemostasis. All counts were correct at the end of the case.  The patient was hemodynamically stable throughout the case, however decision was made with hospitalist to keep the patient intubated for airway protection and decrease risk of patient pulling out tubes and  lines.  The patient went to ICU in stable condition.   Dorothyann Brittle, DO  [facility] [address] [place], [place] [address] [phone] (office)

## 2024-06-28 NOTE — ED Notes (Signed)
 Pt to OR with OR staff and sign language interpreter. Pt remains calm and cooperative.

## 2024-06-28 NOTE — Brief Op Note (Signed)
 06/28/2024  9:24 PM  PATIENT:  Martin [name]  68 y.o. male  PRE-OPERATIVE DIAGNOSIS:  small bowel obstruction, concern for internal hernia  POST-OPERATIVE DIAGNOSIS:  SMALL BOWEL OBSTRUCTION WITH ADHESIONS  PROCEDURE:  Procedure(s): LAPAROTOMY, EXPLORATORY, LYSIS OF ADHESIONS (N/A)  SURGEON:  Surgeons and Role:    * Lash Matulich, Dorothyann LABOR, DO - Primary  ASSISTANTS: Maire Free, RNFA   ANESTHESIA:   general  EBL:  5 cc   BLOOD ADMINISTERED:none  DRAINS: none   LOCAL MEDICATIONS USED:  NONE  SPECIMEN:  No Specimen  DISPOSITION OF SPECIMEN:  N/A  COUNTS:  YES  DICTATION: .Note written in EPIC  PLAN OF CARE: Admit to inpatient   PATIENT DISPOSITION:  ICU - intubated and hemodynamically stable.   Delay start of Pharmacological VTE agent (>24hrs) due to surgical blood loss or risk of bleeding: no  Dorothyann Brittle, DO [facility] [address] [place], [place] [address] [phone] (office)

## 2024-06-28 NOTE — Patient Instructions (Signed)
 See assessment and plan under each diagnosis in the problem list and acutely for this visit

## 2024-06-28 NOTE — ED Notes (Signed)
 [facility] called and wanted facility to understand that the pt is incompetent. Interpreter is here for the pt.

## 2024-06-28 NOTE — Progress Notes (Signed)
 Patient seen postoperatively. Resting comfortably. Vitals stable.   BP 125/77   Pulse (!) 102   Temp 99.2 F (37.3 C) (Axillary)   Resp 15   Ht (P) 6' 1 (1.854 m)   Wt 60 kg   SpO2 95%   BMI (P) 17.45 kg/m   Will consult PCCM for vent management.   Artie Mcintyre J Finnley [place], DO 06/28/2024

## 2024-06-28 NOTE — ED Notes (Signed)
 Offered pt ODT zofran , but pt refused.   Interpretor services said their ETA is between 2:30-2:45pm

## 2024-06-28 NOTE — Assessment & Plan Note (Addendum)
 Status unknown as he has refused blood draws. I note Advil  ordered on 06/19/2024 at his request for left eye pain.  I had refused to order this as NSAID contraindicated in male with anemia of unknown etilogy. He is  not on PPI. D/C NSAID.

## 2024-06-28 NOTE — ED Notes (Signed)
 Pt is currently throwing up and ODT zofran  was offered. Pt refused, but allowed staff to reposition him in the bed. Also allowed staff to clean his face.

## 2024-06-28 NOTE — ED Notes (Signed)
Pt is a very hard stick

## 2024-06-28 NOTE — Anesthesia Preprocedure Evaluation (Signed)
 Anesthesia Evaluation  Patient identified by MRN, date of birth, ID band Patient awake    Reviewed: Allergy & Precautions, H&P , NPO status , Patient's Chart, lab work & pertinent test results, reviewed documented beta blocker date and time   Airway Mallampati: II  TM Distance: >3 FB Neck ROM: full    Dental no notable dental hx.    Pulmonary neg pulmonary ROS   Pulmonary exam normal breath sounds clear to auscultation       Cardiovascular Exercise Tolerance: Good hypertension, negative cardio ROS  Rhythm:regular Rate:Normal     Neuro/Psych  PSYCHIATRIC DISORDERS      negative neurological ROS  negative psych ROS   GI/Hepatic negative GI ROS, Neg liver ROS,,,  Endo/Other  negative endocrine ROSHypothyroidism    Renal/GU Renal diseasenegative Renal ROS  negative genitourinary   Musculoskeletal   Abdominal   Peds  Hematology negative hematology ROS (+) Blood dyscrasia, anemia   Anesthesia Other Findings   Reproductive/Obstetrics negative OB ROS                              Anesthesia Physical Anesthesia Plan  ASA: 4 and emergent  Anesthesia Plan: General and General ETT   Post-op Pain Management:    Induction:   PONV Risk Score and Plan: Ondansetron   Airway Management Planned:   Additional Equipment:   Intra-op Plan:   Post-operative Plan:   Informed Consent: I have reviewed the patients History and Physical, chart, labs and discussed the procedure including the risks, benefits and alternatives for the proposed anesthesia with the patient or authorized representative who has indicated his/her understanding and acceptance.     Dental Advisory Given  Plan Discussed with: CRNA  Anesthesia Plan Comments:         Anesthesia Quick Evaluation

## 2024-06-28 NOTE — Assessment & Plan Note (Signed)
 Today represents the first occasion that he has allowed a complete exam by me.  This was facilitated by Karyn Meeks ,LPN, with whom he has rapport.  There are also several interpreters  he seems to trust.  An interpreter at the bedside will be critical for implementation of therapeutic plan.

## 2024-06-28 NOTE — H&P (Signed)
 History and Physical    Patient: Martin [name] FMW:[id number] DOB: 23-Feb-1956 DOA: 06/28/2024 DOS: the patient was seen and examined on 06/28/2024 PCP: Pa, [facility]  Patient coming from: SNF  Chief Complaint:  Chief Complaint  Patient presents with   Emesis   HPI: Martin [name] is a 68 y.o. 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  Patient history obtained through interpreter.  He has been having increased vomiting over the last 24 hours.  He does complain of abdominal pain but is unable to specify exact location of it.  He has not been eating and has had decreased output through his ostomy bag.  Due to all of this, he was brought to the hospital for evaluation.  He does have a history of refusing treatments.  Review of Systems: As mentioned in the history of present illness. All other systems reviewed and are negative. Past Medical History:  Diagnosis Date   Chronic constipation    Deaf    Gout    Hypothyroidism    Legally blind    Ogilvie's syndrome    Pyogenic arthritis of right knee joint [facility])    Past Surgical History:  Procedure Laterality Date   EYE SURGERY Right    relieved fluid pressure and cleaned it out   KNEE ARTHROSCOPY Right 12/22/2015   Procedure: ARTHROSCOPY WASHOUT OF SEPTIC RIGHT KNEE;  Surgeon: Evalene JONETTA Chancy, MD;  Location: [facility] OR;  Service: Orthopedics;  Laterality: Right;   KNEE ARTHROSCOPY Right 06/24/2023   Procedure: ARTHROSCOPY KNEE  DEBRIDEMENT AND WASHOUT;  Surgeon: Cleotilde Barrio, MD;  Location: [facility] ORS;  Service: Orthopedics;  Laterality: Right;   KNEE SURGERY Left 2015/2016   Social History:  reports that he has never smoked. He has never used smokeless tobacco. He reports that he does not drink alcohol  and does not use drugs.  Allergies  Allergen Reactions   Ivp Dye [Iodinated Contrast Media] Other (See Comments)    Pt reports  he has seizures.    Family History  Family history unknown: Yes    Prior to Admission medications   Medication Sig Start Date End Date Taking? Authorizing Provider  acetaminophen  (TYLENOL ) 650 MG CR tablet Take 650 mg by mouth every 6 (six) hours as needed.    [provider]  brimonidine (ALPHAGAN) 0.15 % ophthalmic solution Place 1 drop into the left eye 3 (three) times daily.    [provider]  Cholecalciferol 25 MCG (1000 UT) capsule Take 2,000 Units by mouth daily.    [provider]  diclofenac  Sodium (VOLTAREN ) 1 % GEL Apply 2 g topically every 6 (six) hours as needed. Apply to right shoulder    [provider]  dorzolamide -timolol  (COSOPT ) 22.3-6.8 MG/ML ophthalmic solution Place 1 drop into both eyes 2 (two) times daily. 04/05/17   Marcus Annabella RAMAN, PA-C  ibuprofen  (ADVIL ) 200 MG tablet Take 200 mg by mouth every 6 (six) hours as needed.    [provider]  latanoprost  (XALATAN ) 0.005 % ophthalmic solution Place 1 drop into both eyes at bedtime.    [provider]  levothyroxine  (SYNTHROID ) 125 MCG tablet Take 1 tablet (125 mcg total) by mouth daily before breakfast. 06/06/20   Raenelle Coria, MD  linaclotide  (LINZESS ) 290 MCG CAPS capsule Take 1 capsule (290 mcg total) by mouth daily before breakfast. 12/09/20   Khatri, Hina, PA-C  Netarsudil  Dimesylate (RHOPRESSA) 0.02 % SOLN Place 1  drop into both eyes at bedtime.    [provider]  polyethylene glycol (MIRALAX  / GLYCOLAX ) 17 g packet Take 17 g by mouth 2 (two) times daily as needed.    [provider]    Physical Exam: Vitals:   06/28/24 1211 06/28/24 1240  BP:  125/77  Pulse:  (!) 102  Resp:  15  Temp:  99.2 F (37.3 C)  TempSrc:  Axillary  SpO2:  95%  Weight: 60 kg   Height: 6' 1 (1.854 m)    General: Elderly male. Awake and alert and oriented x3. No acute cardiopulmonary distress.  HEENT: Normocephalic atraumatic.  Right and left ears normal in  appearance.  Pupils equal, round, reactive to light. Extraocular muscles are intact. Sclerae anicteric and noninjected.  Moist mucosal membranes. No mucosal lesions.  Neck: Neck supple without lymphadenopathy. No carotid bruits. No masses palpated.  Cardiovascular: Regular rate with normal S1-S2 sounds. No murmurs, rubs, gallops auscultated. No JVD.  Respiratory: Good respiratory effort with no wheezes, rales, rhonchi. Lungs clear to auscultation bilaterally.  No accessory muscle use. Abdomen: Rigid abdomen that is very tender to palpation throughout.  Patient guards during exam.  Unable to really cruciate whether the patient has rebound tenderness.  Active bowel sounds. No masses or hepatosplenomegaly  Skin: No rashes, lesions, or ulcerations.  Dry, warm to touch. 2+ dorsalis pedis and radial pulses. Musculoskeletal: No calf or leg pain. All major joints not erythematous nontender.  No upper or lower joint deformation.  Good ROM.  No contractures  Psychiatric: Intact judgment and insight. Pleasant and cooperative. Neurologic: No focal neurological deficits. Strength is 5/5 and symmetric in upper and lower extremities.  Cranial nerves II through XII are grossly intact.  Data Reviewed: Labs and imaging reviewed by me  Assessment and Plan: No notes have been filed under this hospital service. Service: Hospitalist  Principal Problem:   SBO (small bowel obstruction) (HCC) Active Problems:   Legally blind   Deaf   Hypothyroidism, unspecified   Adjustment disorder with mixed anxiety and depressed mood   Ogilvie's syndrome   CKD (chronic kidney disease)  Small bowel obstruction with possible internal hernia Patient going for emergent surgery Will need NG tube following for bowel decompression.  Due to the patient's impairments, there is a high risk that the patient will remove the NG tube, which he desperately needs to keep the bowel decompressed.  Did have a conversation with the general  surgeon and we both agree that leaving the patient intubated overnight would be in the patient's best interest so that he does not pull of the NG tube.  We will reevaluate the patient in the morning to see whether he can be safely extubated. Fentanyl  drip with Versed  pushes for sedation and pain control NG tube to low intermittent suction IV Protonix  for gastric protection Will consult PCCM for vent management once the patient is postoperative Ogilvie syndrome History of abdominal surgery including colectomy Concerns of internal hernia Chronic kidney disease Hypothyroidism Currently holding levothyroxine  Elevated blood sugar Check hemoglobin A1c CBGs every 4 hours Sliding scale every 4 hours Visual and hearing impairments   Advance Care Planning:   Code Status: Prior full code  Consults: General surgery, PCCM  Family Communication: None.  Interpreter used to gain history  Severity of Illness: The appropriate patient status for this patient is INPATIENT. Inpatient status is judged to be reasonable and necessary in order to provide the required intensity of service to ensure the patient's safety.  The patient's presenting symptoms, physical exam findings, and initial radiographic and laboratory data in the context of their chronic comorbidities is felt to place them at high risk for further clinical deterioration. Furthermore, it is not anticipated that the patient will be medically stable for discharge from the hospital within 2 midnights of admission.   * I certify that at the point of admission it is my clinical judgment that the patient will require inpatient hospital care spanning beyond 2 midnights from the point of admission due to high intensity of service, high risk for further deterioration and high frequency of surveillance required.*  Author: Mekesha Solomon J Ceana Fiala, DO 06/28/2024 6:33 PM  For on call review [email or web address].

## 2024-06-28 NOTE — Assessment & Plan Note (Addendum)
 Current status unknown as he has been non adherent to thyroid  supplementation. Severe hypothyroidism may be contributing factor to acute GI presentation.

## 2024-06-28 NOTE — Progress Notes (Addendum)
 NURSING HOME LOCATION:  [facility] ROOM NUMBER:  [id number] W  CODE STATUS:  Full Code  PCP: [facility], [facility]   This is a nursing facility follow up visit for specific acute issue of nausea & vomiting in context of decreased ostomy output & abdominal distention..  Interim medical record and care since last SNF visit was updated with review of diagnostic studies and change in clinical status since last visit were documented.  YEP:Martin [name] reported decreased stool output , abdominal distention & nausea & vomiting.  Review of systems could not complete as he was non verbal.  Physical exam:  Pertinent or positive findings: He was essentially non verbal as noted. He refused BP measurement. He appears grossly cachetic.Overall he was lethargic when not pushing Nurse away when BP attempted. Eyes are half closed & he tends to look to his left. Intraoral exam very limited. No goiter present.Ribs visible.Breath sounds are decreased. Heart sounds are distant; S2 is slightly increased. Bowel sounds are decreased with intermittent quasi ileus sounds. Ostomy border is extruding from site. Tender to palpation RLQ. Limbs severely atrophic. Boss @ R shin @ site of prior tib/fib fractures. Great toenails thickened. Toes contracted. Exfoliation over R thigh > L. Approximately 8 oz of thin ,urine colored vomitus in bedside emesis basin. Skin slightly damp.  General appearance: no increased work of breathing is present.   Lymphatic: No lymphadenopathy about the head, neck, axilla. Eyes: No conjunctival inflammation or lid edema is present. There is no scleral icterus. Ears:  External ear exam shows no significant lesions or deformities.   Nose:  External nasal examination shows no deformity or inflammation. Nasal mucosa are pink and moist without lesions, exudates Oral exam:  Lips and gums are healthy appearing. There is no oropharyngeal erythema or exudate. Neck:  No thyromegaly, masses, tenderness  noted.    Heart:  No  gallop, murmur, click, rub .  Lungs:  without wheezes, rhonchi, rales, rubs. GU: Deferred  Extremities:  No cyanosis, clubbing, edema  Neurologic exam :Balance, Rhomberg, finger to nose testing could not be completed due to clinical state Skin: Warm & dry w/o tenting. No significant lesions or rash.  See summary under each active problem in the Problem List with associated updated therapeutic plan  Colostomy prolapse (HCC) Abdomen distended with decreased bowel sounds with quasi ileus bowel sounds & extrusion of ostomy border. Imaging critical to define process.  Hypothyroidism, unspecified Current status unknown as he has been non adherent to thyroid  supplementation. Severe hypothyroidism may be contributing factor to acute GI presentation.  Iron  deficiency anemia Status unknown as he has refused blood draws. I note Advil  ordered on 06/19/2024 at his request for left eye pain.  I had refused to order this as NSAID contraindicated in male with anemia of unknown etilogy. He is  not on PPI. D/C NSAID.  Protein calorie malnutrition (HCC) He is almost cachectic with visible ribs & severe limb atrophy. Current albumin & total protein unknown as blood draws refused.  Vitamin D  deficiency Vitamin D ,25 OH level can be checked. Ideally BMD assessment could define severity of any osteoporosis in context of prior tib/fib fractures.  Behavioural, emotional, and social difficulties (BESD) Today represents the first occasion that he has allowed a complete exam by me.  This was facilitated by Karyn Meeks ,LPN, with whom he has rapport.  There are also several interpreters  he seems to trust.  An interpreter at the bedside will be critical for implementation of therapeutic  plan.

## 2024-06-28 NOTE — ED Provider Notes (Signed)
 Signout from Dr. Zammit.  68 year old male from nursing home, deaf and blind using interpreter.  Colostomy.  Complaining of vomiting and abdominal distention.  Lab work fairly benign.  He is pending CT abdomen and pelvis.  Disposition per results of CT. Physical Exam  BP 125/77   Pulse (!) 102   Temp 99.2 F (37.3 C) (Axillary)   Resp 15   Ht 6' 1 (1.854 m)   Wt 60 kg   SpO2 95%   BMI 17.45 kg/m   Physical Exam  Procedures  Procedures  ED Course / MDM    Medical Decision Making Amount and/or Complexity of Data Reviewed Labs: ordered. Radiology: ordered.  Risk Prescription drug management. Decision regarding hospitalization.   CT showing high-grade SBO.  I reviewed this with patient via translator at bedside.  Something he has not felt well for a few days but has been vomiting since last night.  This has happened before and he was admitted and had a bowel regiment which seemed to relieve the obstruction without surgery.  They understand I will be reaching out to general surgery to review.  Abdomen is distended and tympanitic but not particularly tender.  Ostomy is pink without any significant output.  1745.  Discussed with Dr. Evonnie.  She asked an NG placed and he potentially is going to need surgery.  Will need medical admission.  I reviewed this with the patient via medical interpreter. 1800.  Discussed with [facility] hospitalist Dr. Barbra who will evaluate patient for admission    Towana Ozell BROCKS, MD 06/29/24 [phone]

## 2024-06-28 NOTE — Progress Notes (Signed)
 Decreased rate from 16 to 10 , for high PH.

## 2024-06-28 NOTE — ED Notes (Addendum)
 Called [facility] and they said they were with another case in [place] and would assist us  at their earliest availability.

## 2024-06-28 NOTE — Progress Notes (Addendum)
 eLink Physician-Brief Progress Note Patient Name: Martin [name] DOB: 1956/03/19 MRN: [id number]   Date of Service  06/28/2024  HPI/Events of Note  68/M with Ogilvie's syndrome, prior bowel obstruction, presenting with vomiting, abdominal pain. He has not been eating  andwas noted to have decreased output per ostomy bag. Workup in the ED consistent with small bowel obstruction. Pt was brought to the OR where he underwent laparotomy with lysis of adhesions. No complications reported, pt hemodynamically stable. He was kept intubated post op and admitted to the ICU for continued management.   eICU Interventions  SBO - S/p Exploratory laparotomy, lysis of adhesions - Will continue to monitor output per ostomy - NGT to low intermittent suction  - Serial abdominal exam - Pain control - Serial abdominal exam - Encourage incentive spirometry post extubation.   Respiratory failure - Pt kept intubated post procedure for airway protection.  - Will maintain on TV 4-54ml/kg PBW, target plateau pressures <30 - Downtitrate FiO2 and PEEP as tolerated.  - Continue pain control, sedation overnight.  - Plan for sedation interruption, possible SBT in AM.         Martin [name] 06/28/2024, 9:38 PM  12:48 AM Pt suddenly became hypotensive, with associated desaturations. No clear reason for acute deterioration.  Precedex  had been discontinued, fentanyl  decreased by 50% SpO2 increased to 100%, with improvement of SpO2, however, pt remains hypotensive.  Will give 1L bolus normal saline now. If persistently hypotensive, not responding to fluids, will plan to start levophed  infusion.  Will check CXR and ABG as well to further evaluate  4:38 AM CXR shows ETT in appropriate position. No new infiltrates/pneumothorax noted.  ABG shows pO2 112 on FiO2 100%.  Since the earlier episode, FiO2 has been slowly weaned down. SpO2 now 100% on FiO2 60%  Pt increasingly agitated despite fentanyl  (300mcg/hr) and  precedex  (1.51mcg/kg/hr). Pt had just received boluses of fentanyl .  Ordered PRN versed  for episodes of breakthrough agitation. Will continue to monitor.

## 2024-06-28 NOTE — ED Notes (Signed)
 Interpreter was called and requested a call back.

## 2024-06-28 NOTE — ED Provider Notes (Signed)
 [place] EMERGENCY DEPARTMENT AT [facility] Provider Note   CSN: [id number] Arrival date & time: 06/28/24  1149     Patient presents with: Emesis   Martin [name] is a 68 y.o. male.   Patient is deaf and legally blind.  He also has a colostomy bag.  Patient started vomiting today.  His abdomen is distended  The history is provided by the nursing home and a significant other.  Emesis Severity:  Moderate Timing:  Constant Quality:  Undigested food Able to tolerate:  Liquids Progression:  Worsening Chronicity:  New Recent urination:  Normal Context: not post-tussive   Worsened by:  Nothing Ineffective treatments:  None tried Associated symptoms: abdominal pain   Associated symptoms: no cough, no diarrhea and no headaches        Prior to Admission medications   Medication Sig Start Date End Date Taking? Authorizing Provider  acetaminophen  (TYLENOL ) 650 MG CR tablet Take 650 mg by mouth every 6 (six) hours as needed.    [provider]  brimonidine  (ALPHAGAN ) 0.15 % ophthalmic solution Place 1 drop into the left eye 3 (three) times daily.    [provider]  Cholecalciferol 25 MCG (1000 UT) capsule Take 2,000 Units by mouth daily.    [provider]  diclofenac  Sodium (VOLTAREN ) 1 % GEL Apply 2 g topically every 6 (six) hours as needed. Apply to right shoulder    [provider]  dorzolamide -timolol  (COSOPT ) 22.3-6.8 MG/ML ophthalmic solution Place 1 drop into both eyes 2 (two) times daily. 04/05/17   Marcus Annabella RAMAN, PA-C  ibuprofen  (ADVIL ) 200 MG tablet Take 200 mg by mouth every 6 (six) hours as needed.    [provider]  latanoprost  (XALATAN ) 0.005 % ophthalmic solution Place 1 drop into both eyes at bedtime.    [provider]  levothyroxine  (SYNTHROID ) 125 MCG tablet Take 1 tablet (125 mcg total) by mouth daily before breakfast. 06/06/20   Raenelle Coria, MD  linaclotide  (LINZESS ) 290 MCG CAPS capsule Take 1  capsule (290 mcg total) by mouth daily before breakfast. 12/09/20   Khatri, Hina, PA-C  Netarsudil  Dimesylate (RHOPRESSA) 0.02 % SOLN Place 1 drop into both eyes at bedtime.    [provider]  polyethylene glycol (MIRALAX  / GLYCOLAX ) 17 g packet Take 17 g by mouth 2 (two) times daily as needed.    [provider]    Allergies: Ivp dye [iodinated contrast media]    Review of Systems  Constitutional:  Negative for appetite change and fatigue.  HENT:  Negative for congestion, ear discharge and sinus pressure.   Eyes:  Negative for discharge.  Respiratory:  Negative for cough.   Cardiovascular:  Negative for chest pain.  Gastrointestinal:  Positive for abdominal pain and vomiting. Negative for diarrhea.  Genitourinary:  Negative for frequency and hematuria.  Musculoskeletal:  Negative for back pain.  Skin:  Negative for rash.  Neurological:  Negative for seizures and headaches.  Psychiatric/Behavioral:  Negative for hallucinations.     Updated Vital Signs BP 125/77   Pulse (!) 102   Temp 99.2 F (37.3 C) (Axillary)   Resp 15   Ht 6' 1 (1.854 m)   Wt 60 kg   SpO2 95%   BMI 17.45 kg/m   Physical Exam Vitals and nursing note reviewed.  Constitutional:      Appearance: He is well-developed.  HENT:     Head: Normocephalic.  Eyes:     General: No scleral icterus.  Conjunctiva/sclera: Conjunctivae normal.  Neck:     Thyroid : No thyromegaly.  Cardiovascular:     Rate and Rhythm: Normal rate and regular rhythm.     Heart sounds: No murmur heard.    No friction rub. No gallop.  Pulmonary:     Breath sounds: No stridor. No wheezing or rales.  Chest:     Chest wall: No tenderness.  Abdominal:     General: There is distension.     Tenderness: There is abdominal tenderness. There is no rebound.  Musculoskeletal:        General: Normal range of motion.     Cervical back: Neck supple.  Lymphadenopathy:     Cervical: No cervical adenopathy.  Skin:     Findings: No erythema or rash.  Neurological:     Mental Status: He is alert and oriented to person, place, and time.     Motor: No abnormal muscle tone.     Coordination: Coordination normal.  Psychiatric:        Behavior: Behavior normal.     (all labs ordered are listed, but only abnormal results are displayed) Labs Reviewed  CBC  DIFFERENTIAL  LIPASE, BLOOD  COMPREHENSIVE METABOLIC PANEL WITH GFR  URINALYSIS, ROUTINE W REFLEX MICROSCOPIC    EKG: None  Radiology: No results found.   Procedures   Medications Ordered in the ED  ondansetron  (ZOFRAN -ODT) disintegrating tablet 4 mg (has no administration in time range)  sodium chloride  0.9 % bolus 1,000 mL (has no administration in time range)  ondansetron  (ZOFRAN ) injection 4 mg (has no administration in time range)                                    Medical Decision Making Amount and/or Complexity of Data Reviewed Labs: ordered. Radiology: ordered.  Risk Prescription drug management. Decision regarding hospitalization.   Patient with vomiting and distended abdomen.  Labs pending and CT scan will be done.         CT shows small bowel obstruction and the patient was admitted by my colleague     Final diagnoses:  None    ED Discharge Orders     None          Suzette Pac, MD 07/03/24 1034

## 2024-06-28 NOTE — Assessment & Plan Note (Signed)
 He is almost cachectic with visible ribs & severe limb atrophy. Current albumin & total protein unknown as blood draws refused.

## 2024-06-28 NOTE — ED Notes (Signed)
 Pt refused blood work

## 2024-06-28 NOTE — Anesthesia Procedure Notes (Signed)
 Procedure Name: Intubation Date/Time: 06/28/2024 8:10 PM  Performed by: Kendell Yvonna PARAS, MDPre-anesthesia Checklist: Patient identified, Emergency Drugs available, Suction available, Patient being monitored and Timeout performed Patient Re-evaluated:Patient Re-evaluated prior to induction Oxygen Delivery Method: Circle system utilized Preoxygenation: Pre-oxygenation with 100% oxygen Induction Type: IV induction Laryngoscope Size: Glidescope and 3 Grade View: Grade IV Tube type: Oral Tube size: 7.0 mm Number of attempts: 2 Airway Equipment and Method: Video-laryngoscopy and Stylet Placement Confirmation: ETT inserted through vocal cords under direct vision, positive ETCO2 and breath sounds checked- equal and bilateral Secured at: 22 cm Tube secured with: Tape Dental Injury: Teeth and Oropharynx as per pre-operative assessment

## 2024-06-29 ENCOUNTER — Inpatient Hospital Stay (HOSPITAL_COMMUNITY)

## 2024-06-29 DIAGNOSIS — K56609 Unspecified intestinal obstruction, unspecified as to partial versus complete obstruction: Secondary | ICD-10-CM | POA: Diagnosis not present

## 2024-06-29 LAB — GLUCOSE, CAPILLARY
Glucose-Capillary: 151 mg/dL — ABNORMAL HIGH (ref 70–99)
Glucose-Capillary: 139 mg/dL — ABNORMAL HIGH (ref 70–99)
Glucose-Capillary: 103 mg/dL — ABNORMAL HIGH (ref 70–99)
Glucose-Capillary: 97 mg/dL (ref 70–99)
Glucose-Capillary: 115 mg/dL — ABNORMAL HIGH (ref 70–99)
Glucose-Capillary: 115 mg/dL — ABNORMAL HIGH (ref 70–99)

## 2024-06-29 LAB — HEMOGLOBIN A1C
Hgb A1c MFr Bld: 5.1 % (ref 4.8–5.6)
Mean Plasma Glucose: 99.67 mg/dL

## 2024-06-29 LAB — PROTIME-INR
INR: 1.1 (ref 0.8–1.2)
Prothrombin Time: 14.8 s (ref 11.4–15.2)

## 2024-06-29 LAB — BASIC METABOLIC PANEL WITH GFR
Anion gap: 12 (ref 5–15)
BUN: 21 mg/dL (ref 8–23)
CO2: 24 mmol/L (ref 22–32)
Calcium: 8.6 mg/dL — ABNORMAL LOW (ref 8.9–10.3)
Chloride: 107 mmol/L (ref 98–111)
Creatinine, Ser: 0.91 mg/dL (ref 0.61–1.24)
GFR, Estimated: >60 mL/min (ref >60)
Glucose, Bld: 131 mg/dL — ABNORMAL HIGH (ref 70–99)
Potassium: 4.2 mmol/L (ref 3.5–5.1)
Sodium: 143 mmol/L (ref 135–145)

## 2024-06-29 LAB — CBC
HCT: 37.7 % — ABNORMAL LOW (ref 39.0–52.0)
Hemoglobin: 12.3 g/dL — ABNORMAL LOW (ref 13.0–17.0)
MCH: 28.5 pg (ref 26.0–34.0)
MCHC: 32.6 g/dL (ref 30.0–36.0)
MCV: 87.3 fL (ref 80.0–100.0)
Platelets: 189 K/uL (ref 150–400)
RBC: 4.32 MIL/uL (ref 4.22–5.81)
RDW: 14.4 % (ref 11.5–15.5)
WBC: 9.9 K/uL (ref 4.0–10.5)
nRBC: 0 % (ref 0.0–0.2)

## 2024-06-29 LAB — MAGNESIUM: Magnesium: 1.7 mg/dL (ref 1.7–2.4)

## 2024-06-29 LAB — HIV ANTIBODY (ROUTINE TESTING W REFLEX): HIV Screen 4th Generation wRfx: NONREACTIVE

## 2024-06-29 LAB — PHOSPHORUS: Phosphorus: 2.6 mg/dL (ref 2.5–4.6)

## 2024-06-29 LAB — T3: T3, Total: 91 ng/dL (ref 71–180)

## 2024-06-29 MED ORDER — ORAL CARE MOUTH RINSE
15.0000 mL | OROMUCOSAL | Status: DC
  Administered 2024-06-29 – 2024-07-01 (×20): 15 mL via OROMUCOSAL

## 2024-06-29 MED ORDER — POLYETHYLENE GLYCOL 3350 17 G PO PACK
17.0000 g | PACK | Freq: Every day | ORAL | Status: DC
  Administered 2024-06-29 – 2024-06-30 (×2): 17 g
  Filled 2024-06-29 (×2): qty 1

## 2024-06-29 MED ORDER — NOREPINEPHRINE 4 MG/250ML-% IV SOLN
0.0000 ug/min | INTRAVENOUS | Status: DC
  Administered 2024-06-30: 2 ug/min via INTRAVENOUS
  Administered 2024-06-30 – 2024-07-01 (×2): 5 ug/min via INTRAVENOUS
  Filled 2024-06-29: qty 250

## 2024-06-29 MED ORDER — FENTANYL CITRATE PF 50 MCG/ML IJ SOSY
25.0000 ug | PREFILLED_SYRINGE | Freq: Once | INTRAMUSCULAR | Status: AC
  Administered 2024-06-29: 25 ug via INTRAVENOUS

## 2024-06-29 MED ORDER — BISACODYL 10 MG RE SUPP
10.0000 mg | Freq: Every day | RECTAL | Status: DC
  Administered 2024-06-29 – 2024-07-01 (×3): 10 mg via RECTAL
  Filled 2024-06-29 (×4): qty 1

## 2024-06-29 MED ORDER — MAGNESIUM SULFATE 2 GM/50ML IV SOLN
2.0000 g | Freq: Once | INTRAVENOUS | Status: AC
  Administered 2024-06-29: 2 g via INTRAVENOUS
  Filled 2024-06-29: qty 50

## 2024-06-29 MED ORDER — LACTATED RINGERS IV SOLN: INTRAVENOUS | Status: AC

## 2024-06-29 MED ORDER — SODIUM CHLORIDE 0.9 % IV BOLUS
1000.0000 mL | Freq: Once | INTRAVENOUS | Status: AC
  Administered 2024-06-29: 1000 mL via INTRAVENOUS

## 2024-06-29 MED ORDER — SODIUM CHLORIDE 0.9 % IV SOLN: 250.0000 mL | INTRAVENOUS | Status: AC

## 2024-06-29 MED ORDER — ACETAMINOPHEN 160 MG/5ML PO SOLN
1000.0000 mg | Freq: Four times a day (QID) | ORAL | Status: DC
  Administered 2024-06-29 – 2024-07-02 (×13): 1000 mg
  Filled 2024-06-29 (×14): qty 40.6

## 2024-06-29 MED ORDER — ORAL CARE MOUTH RINSE: 15.0000 mL | OROMUCOSAL | Status: DC | PRN

## 2024-06-29 MED ORDER — MIDAZOLAM HCL 2 MG/2ML IJ SOLN
1.0000 mg | INTRAMUSCULAR | Status: AC | PRN
  Administered 2024-06-29 – 2024-06-30 (×3): 1 mg via INTRAVENOUS
  Filled 2024-06-29 (×3): qty 2

## 2024-06-29 MED ORDER — NOREPINEPHRINE 4 MG/250ML-% IV SOLN
INTRAVENOUS | Status: AC
  Administered 2024-06-29: 5 ug/min via INTRAVENOUS
  Filled 2024-06-29: qty 250

## 2024-06-29 MED ORDER — NOREPINEPHRINE 4 MG/250ML-% IV SOLN: 0.0000 ug/min | INTRAVENOUS | Status: DC

## 2024-06-29 MED ORDER — MIDAZOLAM HCL 2 MG/2ML IJ SOLN
1.0000 mg | INTRAMUSCULAR | Status: AC | PRN
  Administered 2024-06-29 (×3): 1 mg via INTRAVENOUS
  Filled 2024-06-29 (×2): qty 2

## 2024-06-29 MED ORDER — FENTANYL CITRATE PF 50 MCG/ML IJ SOSY
50.0000 ug | PREFILLED_SYRINGE | Freq: Once | INTRAMUSCULAR | Status: AC
  Administered 2024-06-29: 50 ug via INTRAVENOUS

## 2024-06-29 MED ORDER — SENNOSIDES-DOCUSATE SODIUM 8.6-50 MG PO TABS
1.0000 | ORAL_TABLET | Freq: Two times a day (BID) | ORAL | Status: DC
  Administered 2024-06-29 – 2024-07-03 (×8): 1 via NASOGASTRIC
  Filled 2024-06-29 (×11): qty 1

## 2024-06-29 MED ORDER — MIDAZOLAM HCL 2 MG/2ML IJ SOLN
INTRAMUSCULAR | Status: AC
  Filled 2024-06-29: qty 2

## 2024-06-29 NOTE — Hospital Course (Signed)
 Martin [name] is a 68 y.o. 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  Patient history obtained through interpreter.  He has been having increased vomiting over the last 24 hours.  He does complain of abdominal pain but is unable to specify exact location of it.  He has not been eating and has had decreased output through his ostomy bag.  Due to all of this, he was brought to the hospital for evaluation.  He does have a history of refusing treatments.

## 2024-06-29 NOTE — Progress Notes (Signed)
 PROGRESS NOTE    Patient: Martin [name]                            PCP: [facility], [facility]                    DOB: 05-Jan-1956            DOA: 06/28/2024 FMW:[id number]             DOS: 06/29/2024, 7:09 AM   LOS: 1 day   Date of Service: The patient was seen and examined on 06/29/2024  Subjective:   The patient was seen and examined this morning. Hemodynamically stable. Remains sedated on the vent:  Brief Narrative:   Martin [name] is a 68 y.o. 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  Patient history obtained through interpreter.  He has been having increased vomiting over the last 24 hours.  He does complain of abdominal pain but is unable to specify exact location of it.  He has not been eating and has had decreased output through his ostomy bag.  Due to all of this, he was brought to the hospital for evaluation.  He does have a history of refusing treatments.     Assessment & Plan:   Principal Problem:   SBO (small bowel obstruction) (HCC) Active Problems:   Legally blind   Deaf   Hypothyroidism, unspecified   Adjustment disorder with mixed anxiety and depressed mood   Ogilvie's syndrome   CKD (chronic kidney disease)     Small bowel obstruction with possible internal hernia/ Ogilvie syndrome/ History of abdominal surgery including colectomy POD # 1  - in ICU intubated,  - hemodynamically stable - On the Ventilator  -Will potential attempt extubation tomorrow pending patient cooperation -Will require postop CXR to confirm ET tube location -NG to LIS, -continue for bowel decompression -NPO -IVF - Continue monitor I's and O's - foley catheter   Remain intubated -on the vent  PRVC mode, rate of 10 tidal volume 630 PEEP 5 ABG    Component Value Date/Time   PHART 7.56 (H) 06/28/2024 2320   PCO2ART 27 (L) 06/28/2024 2320   PO2ART 113 (H) 06/28/2024 2320    HCO3 24.2 06/28/2024 2320   TCO2 23 01/04/2017 0028   O2SAT 100 06/28/2024 2320   - As needed DuoNeb - As needed suction - Routine oral care  Sedated; Fentanyl  drip with Versed  pushes for sedation and pain control - Continue IV Protonix ,  Pressure support - IV Levophed  drip with titration for MAP of > 60   High possibility of self extubation, pulling NG tube out, will remain sedated for now  Surgical abdominal wound-clean, colostomy present -40 edema erythema or discharge  Chronic kidney disease Stable at base line  Lab Results  Component Value Date   CREATININE 0.91 06/29/2024   CREATININE 0.85 06/28/2024   CREATININE 0.88 09/26/2023     Hypothyroidism Resuming levothyroxine    Elevated blood sugar Check hemoglobin A1c CBGs every 4 hours with SSI coverage    Visual and hearing impairments - Apparently patient committed through some kind interpreter - Partial visual acuity 1 eye still present        ----------------------------------------------------------------------------------------------------------------------------------------------- Nutritional status:  The patient's BMI is: Body mass index is 15.39 kg/m. I agree with the assessment and plan as outlined   ------------------------------------------------------------------------------------------------------------------------------------------------  DVT prophylaxis:  enoxaparin  (LOVENOX ) injection 40 mg Start: 06/28/24 2230   Code Status:   Code Status: Full Code  Family Communication: No family member present at bedside-  -Advance care planning has been discussed.   Admission status:   Status is: Inpatient Remains inpatient appropriate because: post op Care    Disposition: From  -SNF, will return in 2-3 days Pending bowel function return  Procedures:   No admission procedures for hospital encounter.   Antimicrobials:  Anti-infectives (From admission, onward)    Start      Dose/Rate Route Frequency Ordered Stop   06/28/24 1929  ceFAZolin  (ANCEF ) 2-4 GM/100ML-% IVPB       Note to Pharmacy: Joshua Planas S: cabinet override      06/28/24 1929 06/28/24 2258        Medication:   Chlorhexidine  Gluconate Cloth  6 each Topical Daily   enoxaparin  (LOVENOX ) injection  40 mg Subcutaneous Q24H   insulin  aspart  0-9 Units Subcutaneous Q4H   mouth rinse  15 mL Mouth Rinse Q2H   pantoprazole  (PROTONIX ) IV  40 mg Intravenous Q12H    docusate sodium , hydrALAZINE , midazolam , ondansetron  (ZOFRAN ) IV, ondansetron , mouth rinse, polyethylene glycol   Objective:   Vitals:   06/29/24 0430 06/29/24 0445 06/29/24 0500 06/29/24 0624  BP: (!) 174/82 (!) 114/57 (!) 146/69   Pulse: (!) 124     Resp: (!) 28 10 10    Temp:    98.7 F (37.1 C)  TempSrc:    Bladder  SpO2: 100%     Weight:   52.9 kg   Height:        Intake/Output Summary (Last 24 hours) at 06/29/2024 0709 Last data filed at 06/29/2024 9362 Gross per 24 hour  Intake 3224.32 ml  Output 1105 ml  Net 2119.32 ml   Filed Weights   06/28/24 1211 06/29/24 0500  Weight: 60 kg 52.9 kg     Physical examination:   General:  Sedated and intubated  HEENT:  Baseline hearing loss, visual loss  Neuro:  Limited patient is sedated  Lungs:   Clear to auscultation BL, Respirations unlabored,  No wheezes / crackles  Cardio:    S1/S2, RRR, No murmure, No Rubs or Gallops   Abdomen:  Soft ostomy site clean, negative erythema edema or drainage, surgical wound clean naves erythema edema dressing in place Hypoactive bowel sound, negative for any distention.  Muscular  skeletal:  Limited exam - 2+ pulses,  symmetric, No pitting edema  Skin:  Dry, warm to touch, negative for any Rashes, Surgical wound clean, dressing in place Colostomy site present, negative erythema edema or drainage  Wounds: Lower abdominal surgical wound present, dressing in place        ------------------------------------------------------------------------------------------------------------------------------------------    LABs:     Latest Ref Rng & Units 06/29/2024    2:35 AM 06/28/2024   12:13 PM 09/26/2023    2:49 AM  CBC  WBC 4.0 - 10.5 K/uL 9.9  7.6  6.8   Hemoglobin 13.0 - 17.0 g/dL 87.6  86.1  89.2   Hematocrit 39.0 - 52.0 % 37.7  42.8  32.5   Platelets 150 - 400 K/uL 189  232  155       Latest Ref Rng & Units 06/29/2024    1:30 AM 06/28/2024   12:13 PM 09/26/2023    2:49 AM  CMP  Glucose 70 - 99 mg/dL 868  810  93   BUN 8 - 23 mg/dL 21  22  24  Creatinine 0.61 - 1.24 mg/dL 9.08  9.14  9.11   Sodium 135 - 145 mmol/L 143  139  132   Potassium 3.5 - 5.1 mmol/L 4.2  4.2  4.5   Chloride 98 - 111 mmol/L 107  99  100   CO2 22 - 32 mmol/L 24  26  24    Calcium 8.9 - 10.3 mg/dL 8.6  89.8  9.1   Total Protein 6.5 - 8.1 g/dL  8.6    Total Bilirubin 0.0 - 1.2 mg/dL  0.9    Alkaline Phos 38 - 126 U/L  123    AST 15 - 41 U/L  21    ALT 0 - 44 U/L  13         Micro Results Recent Results (from the past 240 hours)  MRSA Next Gen by PCR, Nasal     Status: None   Collection Time: 06/28/24  9:37 PM   Specimen: Nasal Mucosa; Nasal Swab  Result Value Ref Range Status   MRSA by PCR Next Gen NOT DETECTED NOT DETECTED Final    Comment: (NOTE) The GeneXpert MRSA Assay (FDA approved for NASAL specimens only), is one component of a comprehensive MRSA colonization surveillance program. It is not intended to diagnose MRSA infection nor to guide or monitor treatment for MRSA infections. Test performance is not FDA approved in patients less than 68 years old. Performed at [facility], [address] [place], [place] [address]     Radiology Reports DG CHEST PORT 1 VIEW Result Date: 06/29/2024 EXAM: 1 VIEW XRAY OF THE CHEST 06/29/2024 01:13:33 AM COMPARISON: None available. CLINICAL HISTORY: Hypoxemia; ABG (arterial blood gas) abnormal. FINDINGS: LINES,  TUBES AND DEVICES: Endotracheal tube terminates 2 cm above the carina. Enteric tube terminates in the gastric cardiac, in satisfactory position. LUNGS AND PLEURA: Mild left basilar scarring/atelectasis. No focal pulmonary opacity. No pulmonary edema. No pleural effusion. No pneumothorax. HEART AND MEDIASTINUM: No acute abnormality of the cardiac and mediastinal silhouettes. BONES AND SOFT TISSUES: No acute osseous abnormality. IMPRESSION: 1. Endotracheal tube terminates 2 cm above the carina. 2. Enteric tube terminates in the gastric cardia, in satisfactory position. Electronically signed by: Pinkie Pebbles MD 06/29/2024 01:17 AM EDT RP Workstation: [id number]   CT ABDOMEN PELVIS WO CONTRAST Result Date: 06/28/2024 CLINICAL DATA:  Abdominal pain with nausea and vomiting. EXAM: CT ABDOMEN AND PELVIS WITHOUT CONTRAST TECHNIQUE: Multidetector CT imaging of the abdomen and pelvis was performed following the standard protocol without IV contrast. RADIATION DOSE REDUCTION: This exam was performed according to the departmental dose-optimization program which includes automated exposure control, adjustment of the mA and/or kV according to patient size and/or use of iterative reconstruction technique. COMPARISON:  CT abdomen and pelvis 02/12/2024. FINDINGS: Lower chest: There is atelectasis in the left lung base. There is mild elevation of the left hemidiaphragm. Hepatobiliary: No focal liver abnormality is seen. No gallstones, gallbladder wall thickening, or biliary dilatation. Pancreas: Unremarkable. No pancreatic ductal dilatation or surrounding inflammatory changes. Spleen: Normal in size without focal abnormality. Adrenals/Urinary Tract: Adrenal glands are unremarkable. Kidneys are normal, without renal calculi, focal lesion, or hydronephrosis. Bladder is unremarkable. Stomach/Bowel: The stomach is dilated with large air-fluid level. Jejunal loops are dilated measuring up to 4 cm with air-fluid levels. Transition  point is seen in the central abdomen image 4/66. Distal small bowel is decompressed. Left lower quadrant colostomy is again seen. There is a large amount of stool in the distal colon to the level of the ostomy. The  appendix is not seen. There is no mesenteric edema, free air or pneumatosis. Vascular/Lymphatic: Aortic atherosclerosis. No enlarged abdominal or pelvic lymph nodes. Reproductive: Prostate gland is mildly enlarged. Other: No abdominal wall hernia or abnormality. No abdominopelvic ascites. Musculoskeletal: There are changes of avascular necrosis in the bilateral femoral heads, left greater than right. No evidence for femoral head collapse. Findings are unchanged. IMPRESSION: 1. High grade small-bowel obstruction with transition point in the central abdomen. Swirling of mesentery at this level may be related to internal hernia. 2. Left lower quadrant colostomy. 3. Large amount of stool in the distal colon to the level of the ostomy. 4. 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 5. Aortic atherosclerosis. Aortic Atherosclerosis (ICD10-I70.0). Electronically Signed   By: Greig Pique M.D.   On: 06/28/2024 17:14    SIGNED: Adriana DELENA Grams, MD, FHM. FAAFP. Jolynn Pack - [facility] hospitalist Critical care time spent - 55 min.  In seeing, evaluating and examining the patient. Reviewing medical records, labs, drawn plan of care. [facility],  Pager (please use [email or web address] to page/ text) Please use Epic Secure Chat for non-urgent communication (7AM-7PM)  If 7PM-7AM, please contact night-coverage [email or web address], 06/29/2024, 7:09 AM

## 2024-06-29 NOTE — Progress Notes (Signed)
 eLink Physician-Brief Progress Note Patient Name: Martin [name] DOB: [birth year] MRN: [id number]   Date of Service  06/29/2024  HPI/Events of Note  Notified that patient is maxed on Precedex  1.2 and Fentanyl  400 but would still have episodes of agitation with minimal stimulation. Attempting to pull feeding tube. Previously receiving Versed  prn  Patient seen asleep at this time, intubated  eICU Interventions  Ordered Versed  1 mg q 2 prn x 3 doses. Discussed with BSRN giving closer to the morning for bedside to assess possble extubation Bilateral soft wrist restraints renewed as requested Bedside team to assess in am if restraints to be continued Discussed with CHAR Perkins T Gilbert Narain 06/29/2024, 8:00 PM

## 2024-06-29 NOTE — Progress Notes (Addendum)
 [facility] Progress Note  1 Day Post-Op  Subjective: Patient seen and examined.  He is intubated and sedated in the ICU.  He has had decreased NG tube output with 150 cc overnight and 150 cc of feculent appearing material in the canister.  His ostomy has some liquid and formed stool in the bag.  He is fairly heavily sedated at this time to decrease risk of him pulling out lines and tubes.  He was on Levophed  overnight for soft blood pressures, but is currently only on 2.  He is also on Precedex  drip and fentanyl  drip.  Objective: Vital signs in last 24 hours: Temp:  [94.4 F (34.7 C)-99.3 F (37.4 C)] 96.9 F (36.1 C) (09/20 1230) Pulse Rate:  [49-124] 53 (09/20 1315) Resp:  [6-28] 11 (09/20 1315) BP: (48-198)/(31-151) 92/47 (09/20 1315) SpO2:  [99 %-100 %] 100 % (09/20 1315) FiO2 (%):  [40 %-95 %] (P) 40 % (09/20 1205) Weight:  [52.9 kg] 52.9 kg (09/20 0500) Last BM Date :  (colostomy no output)  Intake/Output from previous day: 09/19 0701 - 09/20 0700 In: 3224.3 [I.V.:2124; IV Piggyback:1100.4] Out: 1105 [Urine:350; Emesis/NG output:150; Blood:5] Intake/Output this shift: Total I/O In: 920.3 [I.V.:920.3] Out: -   General appearance: Intubated and sedated in ICU GI: Abdomen soft, significantly improved distention, non tender to palpation; no rigidity or involuntary guarding; midline incision C/D/I with honeycomb dressing in place, small amount of bloody strikethrough; left-sided ostomy pink and patent with small amount of formed and liquid stool in bag  Lab Results:  Recent Labs    06/28/24 1213 06/29/24 0235  WBC 7.6 9.9  HGB 13.8 12.3*  HCT 42.8 37.7*  PLT 232 189   BMET Recent Labs    06/28/24 1213 06/29/24 0130  NA 139 143  K 4.2 4.2  CL 99 107  CO2 26 24  GLUCOSE 189* 131*  BUN 22 21  CREATININE 0.85 0.91  CALCIUM 10.1 8.6*   PT/INR Recent Labs    06/29/24 0130  LABPROT 14.8  INR 1.1    Studies/Results: DG CHEST PORT 1  VIEW Result Date: 06/29/2024 EXAM: 1 VIEW XRAY OF THE CHEST 06/29/2024 01:13:33 AM COMPARISON: None available. CLINICAL HISTORY: Hypoxemia; ABG (arterial blood gas) abnormal. FINDINGS: LINES, TUBES AND DEVICES: Endotracheal tube terminates 2 cm above the carina. Enteric tube terminates in the gastric cardiac, in satisfactory position. LUNGS AND PLEURA: Mild left basilar scarring/atelectasis. No focal pulmonary opacity. No pulmonary edema. No pleural effusion. No pneumothorax. HEART AND MEDIASTINUM: No acute abnormality of the cardiac and mediastinal silhouettes. BONES AND SOFT TISSUES: No acute osseous abnormality. IMPRESSION: 1. Endotracheal tube terminates 2 cm above the carina. 2. Enteric tube terminates in the gastric cardia, in satisfactory position. Electronically signed by: Pinkie Pebbles MD 06/29/2024 01:17 AM EDT RP Workstation: [id number]   CT ABDOMEN PELVIS WO CONTRAST Result Date: 06/28/2024 CLINICAL DATA:  Abdominal pain with nausea and vomiting. EXAM: CT ABDOMEN AND PELVIS WITHOUT CONTRAST TECHNIQUE: Multidetector CT imaging of the abdomen and pelvis was performed following the standard protocol without IV contrast. RADIATION DOSE REDUCTION: This exam was performed according to the departmental dose-optimization program which includes automated exposure control, adjustment of the mA and/or kV according to patient size and/or use of iterative reconstruction technique. COMPARISON:  CT abdomen and pelvis 02/12/2024. FINDINGS: Lower chest: There is atelectasis in the left lung base. There is mild elevation of the left hemidiaphragm. Hepatobiliary: No focal liver abnormality is seen. No gallstones, gallbladder wall thickening, or biliary  dilatation. Pancreas: Unremarkable. No pancreatic ductal dilatation or surrounding inflammatory changes. Spleen: Normal in size without focal abnormality. Adrenals/Urinary Tract: Adrenal glands are unremarkable. Kidneys are normal, without renal calculi, focal lesion,  or hydronephrosis. Bladder is unremarkable. Stomach/Bowel: The stomach is dilated with large air-fluid level. Jejunal loops are dilated measuring up to 4 cm with air-fluid levels. Transition point is seen in the central abdomen image 4/66. Distal small bowel is decompressed. Left lower quadrant colostomy is again seen. There is a large amount of stool in the distal colon to the level of the ostomy. The appendix is not seen. There is no mesenteric edema, free air or pneumatosis. Vascular/Lymphatic: Aortic atherosclerosis. No enlarged abdominal or pelvic lymph nodes. Reproductive: Prostate gland is mildly enlarged. Other: No abdominal wall hernia or abnormality. No abdominopelvic ascites. Musculoskeletal: There are changes of avascular necrosis in the bilateral femoral heads, left greater than right. No evidence for femoral head collapse. Findings are unchanged. IMPRESSION: 1. High grade small-bowel obstruction with transition point in the central abdomen. Swirling of mesentery at this level may be related to internal hernia. 2. Left lower quadrant colostomy. 3. Large amount of stool in the distal colon to the level of the ostomy. 4. 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 5. Aortic atherosclerosis. Aortic Atherosclerosis (ICD10-I70.0). Electronically Signed   By: Greig Pique M.D.   On: 06/28/2024 17:14    Anti-infectives: Anti-infectives (From admission, onward)    Start     Dose/Rate Route Frequency Ordered Stop   06/28/24 1929  ceFAZolin  (ANCEF ) 2-4 GM/100ML-% IVPB       Note to Pharmacy: Joshua Planas S: cabinet override      06/28/24 1929 06/28/24 2258       Assessment/Plan:  Patient is a 68 year old male who was admitted with small bowel obstruction.  He is status post exploratory laparotomy and lysis of adhesions on 9/19.  -Patient without leukocytosis -No need for antibiotics from surgical standpoint -Plan to maintain ET tube and sedation today.  Will reevaluate possible extubation  tomorrow.  ASL interpreter will need to be present at time of extubation to decrease risk of patient pulling out other tubes and lines -Patient currently sedated with Precedex  and fentanyl  drip -NG to LIS, NPO -Will order bowel regimen today with Senokot S, MiraLAX , and Dulcolax suppository -Monitor ostomy for output -Will plan for KUB tomorrow morning to assess bowel dilation -Appreciate hospitalist and PCCM recommendations   LOS: 1 day    Jermane Brayboy A Cray Monnin 06/29/2024  Note: Portions of this report may have been transcribed using voice recognition software. Every effort has been made to ensure accuracy; however, inadvertent computerized transcription errors may still be present.

## 2024-06-29 NOTE — Plan of Care (Signed)
  Problem: Clinical Measurements: Goal: Respiratory complications will improve Outcome: Progressing Goal: Cardiovascular complication will be avoided Outcome: Progressing   Problem: Nutrition: Goal: Adequate nutrition will be maintained Outcome: Not Progressing   Problem: Coping: Goal: Level of anxiety will decrease Outcome: Not Progressing

## 2024-06-30 ENCOUNTER — Inpatient Hospital Stay (HOSPITAL_COMMUNITY)

## 2024-06-30 DIAGNOSIS — K56609 Unspecified intestinal obstruction, unspecified as to partial versus complete obstruction: Secondary | ICD-10-CM | POA: Diagnosis not present

## 2024-06-30 LAB — COMPREHENSIVE METABOLIC PANEL WITH GFR
ALT: 8 U/L (ref 0–44)
AST: 22 U/L (ref 15–41)
Albumin: 2.6 g/dL — ABNORMAL LOW (ref 3.5–5.0)
Alkaline Phosphatase: 70 U/L (ref 38–126)
Anion gap: 9 (ref 5–15)
BUN: 14 mg/dL (ref 8–23)
CO2: 24 mmol/L (ref 22–32)
Calcium: 8.1 mg/dL — ABNORMAL LOW (ref 8.9–10.3)
Chloride: 107 mmol/L (ref 98–111)
Creatinine, Ser: 0.99 mg/dL (ref 0.61–1.24)
GFR, Estimated: >60 mL/min (ref >60)
Glucose, Bld: 150 mg/dL — ABNORMAL HIGH (ref 70–99)
Potassium: 3.6 mmol/L (ref 3.5–5.1)
Sodium: 140 mmol/L (ref 135–145)
Total Bilirubin: 0.9 mg/dL (ref 0.0–1.2)
Total Protein: 5.6 g/dL — ABNORMAL LOW (ref 6.5–8.1)

## 2024-06-30 LAB — GLUCOSE, CAPILLARY
Glucose-Capillary: 112 mg/dL — ABNORMAL HIGH (ref 70–99)
Glucose-Capillary: 118 mg/dL — ABNORMAL HIGH (ref 70–99)
Glucose-Capillary: 120 mg/dL — ABNORMAL HIGH (ref 70–99)
Glucose-Capillary: 94 mg/dL (ref 70–99)
Glucose-Capillary: 69 mg/dL — ABNORMAL LOW (ref 70–99)
Glucose-Capillary: 95 mg/dL (ref 70–99)

## 2024-06-30 LAB — BLOOD GAS, ARTERIAL
Acid-Base Excess: 0.8 mmol/L (ref 0.0–2.0)
Bicarbonate: 25.3 mmol/L (ref 20.0–28.0)
Drawn by: 22179
FIO2: 100 %
O2 Saturation: 100 %
Patient temperature: 37
pCO2 arterial: 39 mmHg (ref 32–48)
pH, Arterial: 7.42 (ref 7.35–7.45)
pO2, Arterial: 257 mmHg — ABNORMAL HIGH (ref 83–108)

## 2024-06-30 LAB — CBC WITH DIFFERENTIAL/PLATELET
Abs Immature Granulocytes: 0.3 K/uL — ABNORMAL HIGH (ref 0.00–0.07)
Band Neutrophils: 11 %
Basophils Absolute: 0 K/uL (ref 0.0–0.1)
Basophils Relative: 0 %
Eosinophils Absolute: 0 K/uL (ref 0.0–0.5)
Eosinophils Relative: 0 %
HCT: 30.1 % — ABNORMAL LOW (ref 39.0–52.0)
Hemoglobin: 9.4 g/dL — ABNORMAL LOW (ref 13.0–17.0)
Lymphocytes Relative: 16 %
Lymphs Abs: 1.1 K/uL (ref 0.7–4.0)
MCH: 28.7 pg (ref 26.0–34.0)
MCHC: 31.2 g/dL (ref 30.0–36.0)
MCV: 92 fL (ref 80.0–100.0)
Metamyelocytes Relative: 4 %
Monocytes Absolute: 0.4 K/uL (ref 0.1–1.0)
Monocytes Relative: 6 %
Neutro Abs: 5.2 K/uL (ref 1.7–7.7)
Neutrophils Relative %: 63 %
Platelets: 135 K/uL — ABNORMAL LOW (ref 150–400)
RBC: 3.27 MIL/uL — ABNORMAL LOW (ref 4.22–5.81)
RDW: 14.3 % (ref 11.5–15.5)
Smear Review: NORMAL
WBC: 7 K/uL (ref 4.0–10.5)
nRBC: 0 % (ref 0.0–0.2)

## 2024-06-30 MED ORDER — ETOMIDATE 2 MG/ML IV SOLN
INTRAVENOUS | Status: AC
  Administered 2024-06-30: 20 mg
  Filled 2024-06-30: qty 20

## 2024-06-30 MED ORDER — POLYETHYLENE GLYCOL 3350 17 G PO PACK
17.0000 g | PACK | Freq: Every day | ORAL | Status: DC
  Administered 2024-07-01 – 2024-07-03 (×2): 17 g
  Filled 2024-06-30 (×4): qty 1

## 2024-06-30 MED ORDER — DEXTROSE 50 % IV SOLN
12.5000 g | INTRAVENOUS | Status: AC
  Administered 2024-06-30: 12.5 g via INTRAVENOUS
  Filled 2024-06-30: qty 50

## 2024-06-30 MED ORDER — PROPOFOL 1000 MG/100ML IV EMUL: 0.0000 ug/kg/min | INTRAVENOUS | Status: DC

## 2024-06-30 MED ORDER — BRIMONIDINE TARTRATE 0.15 % OP SOLN
1.0000 [drp] | Freq: Three times a day (TID) | OPHTHALMIC | Status: DC
  Administered 2024-06-30 – 2024-07-04 (×8): 1 [drp] via OPHTHALMIC
  Filled 2024-06-30: qty 5

## 2024-06-30 MED ORDER — ROCURONIUM BROMIDE 10 MG/ML (PF) SYRINGE
PREFILLED_SYRINGE | INTRAVENOUS | Status: AC
  Filled 2024-06-30: qty 10

## 2024-06-30 MED ORDER — DORZOLAMIDE HCL-TIMOLOL MAL 2-0.5 % OP SOLN
1.0000 [drp] | Freq: Two times a day (BID) | OPHTHALMIC | Status: DC
  Administered 2024-07-01 – 2024-07-03 (×4): 1 [drp] via OPHTHALMIC
  Filled 2024-06-30: qty 10

## 2024-06-30 MED ORDER — LACTATED RINGERS IV SOLN
INTRAVENOUS | Status: DC
Start: 2024-06-30 — End: 2024-07-01

## 2024-06-30 MED ORDER — DOCUSATE SODIUM 50 MG/5ML PO LIQD
100.0000 mg | Freq: Two times a day (BID) | ORAL | Status: DC
  Administered 2024-06-30 – 2024-07-03 (×6): 100 mg
  Filled 2024-06-30 (×9): qty 10

## 2024-06-30 MED ORDER — FENTANYL CITRATE PF 50 MCG/ML IJ SOSY
50.0000 ug | PREFILLED_SYRINGE | INTRAMUSCULAR | Status: DC | PRN
  Administered 2024-06-30: 100 ug via INTRAVENOUS

## 2024-06-30 MED ORDER — FENTANYL CITRATE PF 50 MCG/ML IJ SOSY: 50.0000 ug | PREFILLED_SYRINGE | INTRAMUSCULAR | Status: DC | PRN

## 2024-06-30 MED ORDER — ALPRAZOLAM 0.5 MG PO TABS
0.5000 mg | ORAL_TABLET | Freq: Three times a day (TID) | ORAL | Status: DC | PRN
  Administered 2024-06-30 – 2024-07-02 (×2): 0.5 mg via NASOGASTRIC
  Filled 2024-06-30 (×2): qty 1

## 2024-06-30 MED ORDER — SUCCINYLCHOLINE CHLORIDE 200 MG/10ML IV SOSY
PREFILLED_SYRINGE | INTRAVENOUS | Status: AC
  Administered 2024-06-30: 200 mg
  Filled 2024-06-30: qty 10

## 2024-06-30 NOTE — Progress Notes (Signed)
 Howard, interpreter services, states interpreter will be [employer]. And that he should arrive sometime between 10am-12pm. Howard states that patient is married. His wife resides in [place] . They have lived apart for a while but patient will speak to wife regularly on video chat on phone.

## 2024-06-30 NOTE — Progress Notes (Signed)
 New update on ASL interpreter, she should be here around 1pm.

## 2024-06-30 NOTE — Progress Notes (Signed)
 0800: made call to [facility], with ASL interpreter services to make her aware patient is awake, still on ventilator and was agitated and would need interpreter soon. Kelle to find someone.  [id number] Chin called back to update that she is still working on finding an available interpreter.  1010: Kelle stated an interpreter should be able to be at patient's bedside around 1430 this afternoon.

## 2024-06-30 NOTE — ED Provider Notes (Signed)
  Physical Exam  BP (!) 151/60   Pulse (!) 111   Temp 97.7 F (36.5 C) (Axillary)   Resp 20   Ht 6' 1 (1.854 m)   Wt 53.6 kg   SpO2 (!) 52%   BMI 15.59 kg/m    I was called to the ICU to intubation a patient who failed extubation trial.  I arrived to the ICU to find pt unresponsive and getting bagged with 100% oxygen.  Pt given 20 mg etomidate  and 100 mg succinylcholine  prior to intubation.  Physical Exam  Procedures  Procedure Name: Intubation Date/Time: 06/30/2024 2:10 PM  Performed by: Dean Clarity, MDPre-anesthesia Checklist: Patient identified, Patient being monitored, Emergency Drugs available, Timeout performed and Suction available Oxygen Delivery Method: Ambu bag Preoxygenation: Pre-oxygenation with 100% oxygen Induction Type: Rapid sequence Ventilation: Mask ventilation without difficulty Laryngoscope Size: Glidescope and 3 Tube size: 7.5 mm Number of attempts: 1 Placement Confirmation: ETT inserted through vocal cords under direct vision, Breath sounds checked- equal and bilateral and Positive ETCO2 Secured at: 23 cm Tube secured with: ETT holder Dental Injury: Teeth and Oropharynx as per pre-operative assessment  Difficulty Due To: Difficult Airway- due to anterior larynx      ED Course / MDM    Medical Decision Making Amount and/or Complexity of Data Reviewed Labs: ordered. Radiology: ordered.  Risk Prescription drug management. Decision regarding hospitalization.   Dr. Willette ([facility]) at bedside to take over care after intubation.       Dean Clarity, MD 06/30/24 [phone]

## 2024-06-30 NOTE — Progress Notes (Signed)
 ASL interpreter at bedside. States patient is very upset.

## 2024-06-30 NOTE — Plan of Care (Signed)
  Problem: Coping: Goal: Level of anxiety will decrease Outcome: Not Progressing   Problem: Clinical Measurements: Goal: Diagnostic test results will improve Outcome: Progressing Goal: Respiratory complications will improve Outcome: Progressing Goal: Cardiovascular complication will be avoided Outcome: Progressing

## 2024-06-30 NOTE — TOC Initial Note (Signed)
 Transition of Care [facility]) - Initial/Assessment Note    Patient Details  Name: Martin [name] MRN: [id number] Date of Birth: [birth year]  Transition of Care [facility]) CM/SW Contact:    Sharlyne Stabs, RN Phone Number: 06/30/2024, 1:01 PM  Clinical Narrative:       Patient admitted with Small bowel obstruction from [facility], Intubated, Plans to extubated today. Patient has Insurance and PCP. TOC following.              Barriers to Discharge: Continued Medical Work up   Patient Goals and CMS Choice Patient states their goals for this hospitalization and ongoing recovery are:: Return to [facility]        Prior Living Arrangements/Services       [facility]     Admission diagnosis:  SBO (small bowel obstruction) (HCC) [K56.609] Patient Active Problem List   Diagnosis Date Noted   SBO (small bowel obstruction) (HCC) 06/28/2024   Normochromic normocytic anemia 05/08/2024   Vitamin D  deficiency 05/08/2024   Chronic cerebral ischemia 02/14/2024   Neurocognitive deficits 02/14/2024   Aortic atherosclerosis (HCC) 02/14/2024   Increased intraocular pressure, bilateral 02/14/2024   Chronic generalized pain 02/14/2024   Behavioural, emotional, and social difficulties (BESD) 12/08/2023   Agitation 11/29/2023   Colostomy prolapse (HCC) 11/29/2023   Patient incapable of making informed decisions 10/25/2023   Protein calorie malnutrition (HCC) 08/25/2023   Epididymitis, bilateral 08/02/2023   Adjustment disorder 08/02/2023   Hypotension 08/01/2023   Impaired decision making 07/31/2023   Iron  deficiency anemia 07/27/2023   Colostomy status (HCC) 06/24/2023   Ogilvie's syndrome    CKD (chronic kidney disease)    Adjustment disorder with mixed anxiety and depressed mood 03/22/2017   Septic joint of right knee joint (HCC) 12/21/2015   Chronic pain syndrome 12/21/2015   Legally blind 12/21/2015   Deaf 12/21/2015   Hypothyroidism, unspecified 12/21/2015   Chronic constipation     PCP:  [facility] Pharmacy:   [id number] [place], [place] - [address] AT [facility] [address] [facility] [address] [place] [address] Phone: [phone] Fax: [phone]  [facility] - [place], [place] - [address] [place] [address] Phone: [phone] Fax: [phone]     Social Drivers of Health (SDOH) Social History: SDOH Screenings   Food Insecurity: Patient Unable To Answer (06/29/2024)  Housing: Unknown (06/29/2024)  Transportation Needs: Patient Unable To Answer (06/29/2024)  Utilities: Patient Unable To Answer (06/29/2024)  Social Connections: Unknown (06/29/2024)  Tobacco Use: Low Risk  (06/28/2024)   SDOH Interventions:     Readmission Risk Interventions    06/30/2024    1:00 PM  Readmission Risk Prevention Plan  Transportation Screening Complete  PCP or Specialist Appt within 5-7 Days Complete  Home Care Screening Complete  Medication Review (RN CM) Complete

## 2024-06-30 NOTE — Procedures (Signed)
 Extubation Procedure Note  Patient Details:   Name: Martin [name] DOB: April 29, 1956 MRN: [id number]   Airway Documentation:    Vent end date  and time: 1255 on 06/30/24    Evaluation  O2 sats: stable throughout Complications: No apparent complications Patient did tolerate procedure well. Bilateral Breath Sounds: Diminished   Yes ,patient able to make vocalizations.  [name] 06/30/2024, 1:04 PM

## 2024-06-30 NOTE — Progress Notes (Addendum)
 [facility] Progress Note  2 Days Post-Op  Subjective: Patient seen and examined.  He remains intubated and sedated in the ICU.  His NG tube output has been about 800 cc in the last 24 hours.  His ostomy has liquid stool and formed stool within the bag.  He remains heavily sedated.  He has been off of Levophed  since 3 AM.  He is currently on a Precedex  drip and fentanyl  drip.  Objective: Vital signs in last 24 hours: Temp:  [96.9 F (36.1 C)-99.4 F (37.4 C)] 97.7 F (36.5 C) (09/21 0805) Pulse Rate:  [46-65] 51 (09/21 0930) Resp:  [10-21] 11 (09/21 1100) BP: (48-180)/(31-101) 127/54 (09/21 1100) SpO2:  [97 %-100 %] 100 % (09/21 1100) FiO2 (%):  [35 %-40 %] 35 % (09/21 0827) Weight:  [53.6 kg] 53.6 kg (09/21 0426) Last BM Date : 06/30/24 (colostomy with output)  Intake/Output from previous day: 09/20 0701 - 09/21 0700 In: 3703.1 [I.V.:3103.1; NG/GT:600] Out: 1250 [Urine:400; Emesis/NG output:800; Stool:50] Intake/Output this shift: Total I/O In: 1301.1 [I.V.:1101.1; NG/GT:200] Out: -   General appearance: alert, cooperative, and no distress GI: Abdomen soft, mild distention, nontender to palpation, no rigidity or involuntary guarding; midline incision C/D/I with honeycomb dressing in place, small amount of bloody strikethrough noted, left-sided ostomy pink and patent with liquid and formed stool in bag  Lab Results:  Recent Labs    06/28/24 1213 06/29/24 0235  WBC 7.6 9.9  HGB 13.8 12.3*  HCT 42.8 37.7*  PLT 232 189   BMET Recent Labs    06/28/24 1213 06/29/24 0130  NA 139 143  K 4.2 4.2  CL 99 107  CO2 26 24  GLUCOSE 189* 131*  BUN 22 21  CREATININE 0.85 0.91  CALCIUM 10.1 8.6*   PT/INR Recent Labs    06/29/24 0130  LABPROT 14.8  INR 1.1    Studies/Results: DG Abd 1 View Result Date: 06/30/2024 CLINICAL DATA:  Ileus, small bowel dilatation. Status post laparotomy 2 days. EXAM: ABDOMEN - 1 VIEW COMPARISON:  Feb 11, 2024. FINDINGS:  Nasogastric tube tip is seen in expected position of proximal stomach. June 28, 2024. Colostomy is noted in left lower quadrant. No small bowel dilatation is noted. Dilated transverse colon is noted with moderate amount of stool seen throughout the colon. Midline surgical staples are noted. Possible Rigler's sign is noted suggesting pneumoperitoneum most likely related to recent surgery. IMPRESSION: 1. Nasogastric tube tip seen in expected position of proximal stomach. 2. Dilated transverse colon is noted with moderate stool burden. No small bowel dilatation is noted. 3. Possible pneumoperitoneum is noted most likely related to recent surgery. Electronically Signed   By: Lynwood Landy Raddle M.D.   On: 06/30/2024 08:07   DG CHEST PORT 1 VIEW Result Date: 06/29/2024 EXAM: 1 VIEW XRAY OF THE CHEST 06/29/2024 01:13:33 AM COMPARISON: None available. CLINICAL HISTORY: Hypoxemia; ABG (arterial blood gas) abnormal. FINDINGS: LINES, TUBES AND DEVICES: Endotracheal tube terminates 2 cm above the carina. Enteric tube terminates in the gastric cardiac, in satisfactory position. LUNGS AND PLEURA: Mild left basilar scarring/atelectasis. No focal pulmonary opacity. No pulmonary edema. No pleural effusion. No pneumothorax. HEART AND MEDIASTINUM: No acute abnormality of the cardiac and mediastinal silhouettes. BONES AND SOFT TISSUES: No acute osseous abnormality. IMPRESSION: 1. Endotracheal tube terminates 2 cm above the carina. 2. Enteric tube terminates in the gastric cardia, in satisfactory position. Electronically signed by: Pinkie Pebbles MD 06/29/2024 01:17 AM EDT RP Workstation: [id number]   CT ABDOMEN  PELVIS WO CONTRAST Result Date: 06/28/2024 CLINICAL DATA:  Abdominal pain with nausea and vomiting. EXAM: CT ABDOMEN AND PELVIS WITHOUT CONTRAST TECHNIQUE: Multidetector CT imaging of the abdomen and pelvis was performed following the standard protocol without IV contrast. RADIATION DOSE REDUCTION: This exam was  performed according to the departmental dose-optimization program which includes automated exposure control, adjustment of the mA and/or kV according to patient size and/or use of iterative reconstruction technique. COMPARISON:  CT abdomen and pelvis 02/12/2024. FINDINGS: Lower chest: There is atelectasis in the left lung base. There is mild elevation of the left hemidiaphragm. Hepatobiliary: No focal liver abnormality is seen. No gallstones, gallbladder wall thickening, or biliary dilatation. Pancreas: Unremarkable. No pancreatic ductal dilatation or surrounding inflammatory changes. Spleen: Normal in size without focal abnormality. Adrenals/Urinary Tract: Adrenal glands are unremarkable. Kidneys are normal, without renal calculi, focal lesion, or hydronephrosis. Bladder is unremarkable. Stomach/Bowel: The stomach is dilated with large air-fluid level. Jejunal loops are dilated measuring up to 4 cm with air-fluid levels. Transition point is seen in the central abdomen image 4/66. Distal small bowel is decompressed. Left lower quadrant colostomy is again seen. There is a large amount of stool in the distal colon to the level of the ostomy. The appendix is not seen. There is no mesenteric edema, free air or pneumatosis. Vascular/Lymphatic: Aortic atherosclerosis. No enlarged abdominal or pelvic lymph nodes. Reproductive: Prostate gland is mildly enlarged. Other: No abdominal wall hernia or abnormality. No abdominopelvic ascites. Musculoskeletal: There are changes of avascular necrosis in the bilateral femoral heads, left greater than right. No evidence for femoral head collapse. Findings are unchanged. IMPRESSION: 1. High grade small-bowel obstruction with transition point in the central abdomen. Swirling of mesentery at this level may be related to internal hernia. 2. Left lower quadrant colostomy. 3. Large amount of stool in the distal colon to the level of the ostomy. 4. 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 5. Aortic atherosclerosis. Aortic Atherosclerosis (ICD10-I70.0). Electronically Signed   By: Greig Pique M.D.   On: 06/28/2024 17:14    Anti-infectives: Anti-infectives (From admission, onward)    Start     Dose/Rate Route Frequency Ordered Stop   06/28/24 1929  ceFAZolin  (ANCEF ) 2-4 GM/100ML-% IVPB       Note to Pharmacy: Joshua Planas S: cabinet override      06/28/24 1929 06/28/24 2258       Assessment/Plan:  Patient is a 68 year old male who was admitted with small bowel obstruction.  He is status post exploratory laparotomy and lysis of adhesions on 9/19.   -KUB this morning with no dilation of small bowel loops and chronic dilation of colon, likely from his Ogilvie's -Recommend extubation today once ASL interpreter is at bedside.  She should be here around 1430 -Patient currently sedated with Precedex  and fentanyl  drip-will wean prior to extubation -NG to LIS, NPO -Try to maintain NG tube after extubation -Continue bowel regimen Senokot S, MiraLAX , and Dulcolax suppository -Monitor ostomy for output -AM labs tomorrow -Appreciate hospitalist recommendations -Dr. Kallie will see the patient for me in my absence this week   LOS: 2 days    Anaisabel Pederson A Delson Dulworth 06/30/2024  Note: Portions of this report may have been transcribed using voice recognition software. Every effort has been made to ensure accuracy; however, inadvertent computerized transcription errors may still be present.

## 2024-06-30 NOTE — Progress Notes (Signed)
 Patient extubated but then subsequently intubated d/t becoming apneic, hypoxic and unresponsive. Patient was very agitated after being extubated. Patient would not let staff put nasal cannula on patient. Patient would not let a new pulse oximetry probe be placed. Patient told interpreter that he needed to spit. Interpreter signed to patient that he could spit in vomit bag or could use Yankauer. Patient continued to be agitated; would not let us  suction his mouth nor change NG tube bridle clip despite interpreter explaining and asking multiple times. When patient became unresponsive, new oyxgen probe was placed on patient and oxygen saturation read around 50%. Patient placed on 15L HFNC, and then was hyperoxygenated via ambubag by respiratory therapist. Dr. Willette and Dr. Evonnie made aware. AC called for ED doctor to come intubate. ED provider at bedside around 1330. Patient given etomidate  and succinylcholine  at 1339 and intubated at 1341 with 7.5 ETT, 23 at the lip. Positive color change on CO2 detector noted. Interpreter, Amy, present for above. Interpretor with many questions about plan for patient going forward. Interpretor made staff aware that patient would probably not want his life prolonged and does not like being in the hospital as he feels hospital staff does things to him instead of for him. Palliative consult will be placed, as well as chaplain consult for decision making process. Interpreter aware patient has legal guardian but feels the interpreters are close to patient as he has no family and they have been with him and assist him daily.

## 2024-06-30 NOTE — Progress Notes (Addendum)
 PROGRESS NOTE    Patient: Martin [name]                            PCP: [facility]                    DOB: 30-Aug-1956            DOA: 06/28/2024 FMW:[id number]             DOS: 06/30/2024, 9:58 AM   LOS: 2 days   Date of Service: The patient was seen and examined on 06/30/2024  Subjective:   The patient was seen and examined, comfortable sedated intubated on the vent Hemodynamically stable No issues overnight  Brief Narrative:   Martin [name] is a 68 y.o. 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  Patient history obtained through interpreter.  He has been having increased vomiting over the last 24 hours.  He does complain of abdominal pain but is unable to specify exact location of it.  He has not been eating and has had decreased output through his ostomy bag.  Due to all of this, he was brought to the hospital for evaluation.  He does have a history of refusing treatments.     Assessment & Plan:   Principal Problem:   SBO (small bowel obstruction) (HCC) Active Problems:   Legally blind   Deaf   Hypothyroidism, unspecified   Adjustment disorder with mixed anxiety and depressed mood   Ogilvie's syndrome   CKD (chronic kidney disease)     Small bowel obstruction with possible internal hernia/ Ogilvie syndrome/ History of abdominal surgery including colectomy POD # 2  - Remain intubated, close monitoring in ICU setting - Remain on the vent-hemodynamically stable - Planning for extubation later today with surgery, pending intraoperative bedside  -NG to LIS, -continue for bowel decompression -NPO -IVF - Continue monitor I's and O's - foley catheter  Intake/Output Summary (Last 24 hours) at 06/30/2024 1002 Last data filed at 06/30/2024 0843 Gross per 24 hour  Intake 4047.11 ml  Output 1250 ml  Net 2797.11 ml     Remain intubated -on the vent  PRVC  mode, rate of 10 tidal volume 630 PEEP 5 ABG as needed  - As needed DuoNeb - As needed suction - Routine oral care  Sedated; -On fentanyl  drip plus Precedex  - Continue IV Protonix ,  Pressure support - IV Levophed  drip with titration for MAP of > 60   High possibility of self extubation, pulling NG tube out, will remain sedated for now  Surgical abdominal wound-clean, colostomy present -40 edema erythema or discharge  Chronic kidney disease Stable at base line  Lab Results  Component Value Date   CREATININE 0.91 06/29/2024   CREATININE 0.85 06/28/2024   CREATININE 0.88 09/26/2023     Hypothyroidism Resuming levothyroxine    Elevated blood sugar Check hemoglobin A1c CBGs every 4 hours with SSI coverage    Visual and hearing impairments - Apparently patient committed through some kind interpreter - Partial visual acuity 1 eye still present        ----------------------------------------------------------------------------------------------------------------------------------------------- Nutritional status:  The patient's BMI is: Body mass index is 15.59 kg/m. I agree with the assessment and plan as outlined   ------------------------------------------------------------------------------------------------------------------------------------------------  DVT prophylaxis:  enoxaparin  (LOVENOX ) injection 40 mg Start: 06/28/24 2230   Code Status:   Code Status: Full  Code  Family Communication: No family member present at bedside-  -Advance care planning has been discussed.   Admission status:   Status is: Inpatient Remains inpatient appropriate because: post op Care    Disposition: From  -SNF, will return in 2-3 days Pending bowel function return  Procedures:   No admission procedures for hospital encounter.   Antimicrobials:  Anti-infectives (From admission, onward)    Start     Dose/Rate Route Frequency Ordered Stop   06/28/24 1929  ceFAZolin   (ANCEF ) 2-4 GM/100ML-% IVPB       Note to Pharmacy: Joshua Planas S: cabinet override      06/28/24 1929 06/28/24 2258        Medication:   acetaminophen  (TYLENOL ) oral liquid 160 mg/5 mL  1,000 mg Per Tube Q6H   bisacodyl   10 mg Rectal Daily   Chlorhexidine  Gluconate Cloth  6 each Topical Daily   enoxaparin  (LOVENOX ) injection  40 mg Subcutaneous Q24H   insulin  aspart  0-9 Units Subcutaneous Q4H   mouth rinse  15 mL Mouth Rinse Q2H   pantoprazole  (PROTONIX ) IV  40 mg Intravenous Q12H   polyethylene glycol  17 g Per Tube Daily   senna-docusate  1 tablet Per NG tube BID    hydrALAZINE , ondansetron  (ZOFRAN ) IV, ondansetron , mouth rinse   Objective:   Vitals:   06/30/24 0805 06/30/24 0830 06/30/24 0900 06/30/24 0930  BP:  (!) 143/63 135/77 (!) 113/53  Pulse:  (!) 54  (!) 51  Resp:  13 20 10   Temp: 97.7 F (36.5 C)     TempSrc: Axillary     SpO2:  100% 100% 99%  Weight:      Height:        Intake/Output Summary (Last 24 hours) at 06/30/2024 0958 Last data filed at 06/30/2024 9156 Gross per 24 hour  Intake 4047.11 ml  Output 1250 ml  Net 2797.11 ml   Filed Weights   06/28/24 1211 06/29/24 0500 06/30/24 0426  Weight: 60 kg 52.9 kg 53.6 kg     Physical examination:        General:  Sedated -  intubated  HEENT:  Patient is documented for blindness/deaf- Within normal limits-sedated, ET tube in place on vent  Neuro:  Limited exam patient sedated  Lungs:   ET tube in place, on the vent Clear to auscultation BL, Respirations unlabored,  No wheezes / crackles  Cardio:    S1/S2, RRR, No murmure, No Rubs or Gallops   Abdomen:  Soft, non-tender, bowel sounds active all four quadrants, no guarding or peritoneal signs. Colostomy in place, functional, with minimal residual, surgical abdominal wound clean, dressing in place  Muscular  skeletal:  Limited exam -sedated 2+ pulses,   No pitting edema  Skin:  Dry, warm to touch, colostomy, lower abdomen surgical wound  dressing in place Drainage erythema with edema  Wounds: Postsurgical abdominal wound          ------------------------------------------------------------------------------------------------------------------------------------------    LABs:     Latest Ref Rng & Units 06/29/2024    2:35 AM 06/28/2024   12:13 PM 09/26/2023    2:49 AM  CBC  WBC 4.0 - 10.5 K/uL 9.9  7.6  6.8   Hemoglobin 13.0 - 17.0 g/dL 87.6  86.1  89.2   Hematocrit 39.0 - 52.0 % 37.7  42.8  32.5   Platelets 150 - 400 K/uL 189  232  155       Latest Ref Rng & Units 06/29/2024  1:30 AM 06/28/2024   12:13 PM 09/26/2023    2:49 AM  CMP  Glucose 70 - 99 mg/dL 868  810  93   BUN 8 - 23 mg/dL 21  22  24    Creatinine 0.61 - 1.24 mg/dL 9.08  9.14  9.11   Sodium 135 - 145 mmol/L 143  139  132   Potassium 3.5 - 5.1 mmol/L 4.2  4.2  4.5   Chloride 98 - 111 mmol/L 107  99  100   CO2 22 - 32 mmol/L 24  26  24    Calcium 8.9 - 10.3 mg/dL 8.6  89.8  9.1   Total Protein 6.5 - 8.1 g/dL  8.6    Total Bilirubin 0.0 - 1.2 mg/dL  0.9    Alkaline Phos 38 - 126 U/L  123    AST 15 - 41 U/L  21    ALT 0 - 44 U/L  13         Micro Results Recent Results (from the past 240 hours)  MRSA Next Gen by PCR, Nasal     Status: None   Collection Time: 06/28/24  9:37 PM   Specimen: Nasal Mucosa; Nasal Swab  Result Value Ref Range Status   MRSA by PCR Next Gen NOT DETECTED NOT DETECTED Final    Comment: (NOTE) The GeneXpert MRSA Assay (FDA approved for NASAL specimens only), is one component of a comprehensive MRSA colonization surveillance program. It is not intended to diagnose MRSA infection nor to guide or monitor treatment for MRSA infections. Test performance is not FDA approved in patients less than 41 years old. Performed at [facility], [address] [place], [place] [address]     Radiology Reports DG Abd 1 View Result Date: 06/30/2024 CLINICAL DATA:  Ileus, small bowel dilatation. Status post laparotomy 2 days.  EXAM: ABDOMEN - 1 VIEW COMPARISON:  Feb 11, 2024. FINDINGS: Nasogastric tube tip is seen in expected position of proximal stomach. June 28, 2024. Colostomy is noted in left lower quadrant. No small bowel dilatation is noted. Dilated transverse colon is noted with moderate amount of stool seen throughout the colon. Midline surgical staples are noted. Possible Rigler's sign is noted suggesting pneumoperitoneum most likely related to recent surgery. IMPRESSION: 1. Nasogastric tube tip seen in expected position of proximal stomach. 2. Dilated transverse colon is noted with moderate stool burden. No small bowel dilatation is noted. 3. Possible pneumoperitoneum is noted most likely related to recent surgery. Electronically Signed   By: Lynwood Landy Raddle M.D.   On: 06/30/2024 08:07    SIGNED: Adriana DELENA Grams, MD, FHM. FAAFP. [facility] - [facility] Critical care time spent - 75 min.  In seeing, evaluating and examining the patient. Reviewing medical records, labs, drawn plan of care. [facility],  Pager (please use [email or web address] to page/ text) Please use Epic Secure Chat for non-urgent communication (7AM-7PM)  If 7PM-7AM, please contact night-coverage [email or web address], 06/30/2024, 9:58 AM

## 2024-07-01 ENCOUNTER — Inpatient Hospital Stay (HOSPITAL_COMMUNITY)

## 2024-07-01 ENCOUNTER — Encounter (HOSPITAL_COMMUNITY): Payer: Self-pay | Admitting: Surgery

## 2024-07-01 DIAGNOSIS — Z7189 Other specified counseling: Secondary | ICD-10-CM | POA: Diagnosis not present

## 2024-07-01 DIAGNOSIS — K56609 Unspecified intestinal obstruction, unspecified as to partial versus complete obstruction: Secondary | ICD-10-CM | POA: Diagnosis not present

## 2024-07-01 DIAGNOSIS — Z515 Encounter for palliative care: Secondary | ICD-10-CM | POA: Diagnosis not present

## 2024-07-01 LAB — COMPREHENSIVE METABOLIC PANEL WITH GFR
ALT: 10 U/L (ref 0–44)
AST: 22 U/L (ref 15–41)
Albumin: 2.5 g/dL — ABNORMAL LOW (ref 3.5–5.0)
Alkaline Phosphatase: 72 U/L (ref 38–126)
Anion gap: 11 (ref 5–15)
BUN: 11 mg/dL (ref 8–23)
CO2: 23 mmol/L (ref 22–32)
Calcium: 8 mg/dL — ABNORMAL LOW (ref 8.9–10.3)
Chloride: 104 mmol/L (ref 98–111)
Creatinine, Ser: 0.76 mg/dL (ref 0.61–1.24)
GFR, Estimated: >60 mL/min (ref >60)
Glucose, Bld: 100 mg/dL — ABNORMAL HIGH (ref 70–99)
Potassium: 3.3 mmol/L — ABNORMAL LOW (ref 3.5–5.1)
Sodium: 138 mmol/L (ref 135–145)
Total Bilirubin: 1.3 mg/dL — ABNORMAL HIGH (ref 0.0–1.2)
Total Protein: 5.6 g/dL — ABNORMAL LOW (ref 6.5–8.1)

## 2024-07-01 LAB — CBC
HCT: 31.9 % — ABNORMAL LOW (ref 39.0–52.0)
Hemoglobin: 9.9 g/dL — ABNORMAL LOW (ref 13.0–17.0)
MCH: 28 pg (ref 26.0–34.0)
MCHC: 31 g/dL (ref 30.0–36.0)
MCV: 90.1 fL (ref 80.0–100.0)
Platelets: 116 K/uL — ABNORMAL LOW (ref 150–400)
RBC: 3.54 MIL/uL — ABNORMAL LOW (ref 4.22–5.81)
RDW: 13.8 % (ref 11.5–15.5)
WBC: 6.4 K/uL (ref 4.0–10.5)
nRBC: 0 % (ref 0.0–0.2)

## 2024-07-01 LAB — GLUCOSE, CAPILLARY
Glucose-Capillary: 106 mg/dL — ABNORMAL HIGH (ref 70–99)
Glucose-Capillary: 140 mg/dL — ABNORMAL HIGH (ref 70–99)
Glucose-Capillary: 150 mg/dL — ABNORMAL HIGH (ref 70–99)
Glucose-Capillary: 193 mg/dL — ABNORMAL HIGH (ref 70–99)
Glucose-Capillary: 82 mg/dL (ref 70–99)
Glucose-Capillary: 91 mg/dL (ref 70–99)

## 2024-07-01 MED ORDER — POTASSIUM CHLORIDE 20 MEQ PO PACK
40.0000 meq | PACK | Freq: Once | ORAL | Status: AC
  Administered 2024-07-01: 40 meq
  Filled 2024-07-01: qty 2

## 2024-07-01 MED ORDER — POTASSIUM CHLORIDE 2 MEQ/ML IV SOLN
INTRAVENOUS | Status: AC
  Filled 2024-07-01 (×3): qty 1000

## 2024-07-01 MED ORDER — INFLUENZA VAC SPLIT HIGH-DOSE 0.5 ML IM SUSY: 0.5000 mL | PREFILLED_SYRINGE | INTRAMUSCULAR | Status: DC | PRN

## 2024-07-01 MED ORDER — LACTATED RINGERS IV BOLUS
250.0000 mL | Freq: Once | INTRAVENOUS | Status: AC
  Administered 2024-07-01: 250 mL via INTRAVENOUS

## 2024-07-01 MED ORDER — PNEUMOCOCCAL 20-VAL CONJ VACC 0.5 ML IM SUSY: 0.5000 mL | PREFILLED_SYRINGE | INTRAMUSCULAR | Status: DC | PRN

## 2024-07-01 MED ORDER — METHYLPREDNISOLONE SODIUM SUCC 125 MG IJ SOLR
INTRAMUSCULAR | Status: AC
  Administered 2024-07-01: 125 mg
  Filled 2024-07-01: qty 2

## 2024-07-01 MED ORDER — DEXTROSE-SODIUM CHLORIDE 5-0.45 % IV SOLN: INTRAVENOUS | Status: AC

## 2024-07-01 NOTE — Progress Notes (Signed)
 [facility]  Patient seen this AM. Team has extubated him since that time. NPO, NG in place.   BP (!) 117/47   Pulse 66   Temp 99.4 F (37.4 C) (Axillary)   Resp 16   Ht 6' 1 (1.854 m)   Wt 55.9 kg   SpO2 96%   BMI 16.26 kg/m  Soft, nondistended, ostomy pink, midline with dry staining on honeycomb  Patient s/p Ex lap LOA for SBO. Doing fair. Extubated again. Will wait for bowel function and to ensure he is extubated before removing the NG Bowel regimen ordered by Dr. Evonnie. Npo ng for now  H&H stable, no leukocytosis   Discussed with Dr. Twana.  Manuelita Pander, MD [facility] [address] [place], [place] [address] [phone] (office)

## 2024-07-01 NOTE — Plan of Care (Signed)
 This patient remains on AP--CCU as of time of writing. I spoke with the interpreter service overnight who stated they would have someone onsite tomorrow morning to assist with the patient's care. The patient has exhibited progressively aggressive behavior overnight, progressing from writing written communication to swinging his fist at staff and attempting to remove NGT; thus the patient ultimately required bilateral wrist restraints (see flowsheet for details). Restraints were discussed via secure chat with Dr. Adefeso and this RN at 2010 hours (see new orders). The patient continues to have a foley catheter in place. The patient has required heat therapy overnight. The patient continues to receive MIVF via PIV. NGT remains in place and marked at 57 cm  (unchanged).   Edit: 07/02/24 0620 hours: Patient declined to participate with AM lab draw; no AM labs were drawn. Dr. Manfred notified via secure chat by this RN at this time.  Problem: Education: Goal: Knowledge of General Education information will improve Description: Including pain rating scale, medication(s)/side effects and non-pharmacologic comfort measures Outcome: Not Progressing   Problem: Health Behavior/Discharge Planning: Goal: Ability to manage health-related needs will improve Outcome: Not Progressing   Problem: Clinical Measurements: Goal: Ability to maintain clinical measurements within normal limits will improve Outcome: Not Progressing Goal: Will remain free from infection Outcome: Not Progressing Goal: Diagnostic test results will improve Outcome: Not Progressing Goal: Respiratory complications will improve Outcome: Not Progressing Goal: Cardiovascular complication will be avoided Outcome: Not Progressing   Problem: Activity: Goal: Risk for activity intolerance will decrease Outcome: Not Progressing   Problem: Nutrition: Goal: Adequate nutrition will be maintained Outcome: Not Progressing   Problem:  Coping: Goal: Level of anxiety will decrease Outcome: Not Progressing   Problem: Elimination: Goal: Will not experience complications related to bowel motility Outcome: Not Progressing Goal: Will not experience complications related to urinary retention Outcome: Not Progressing   Problem: Pain Managment: Goal: General experience of comfort will improve and/or be controlled Outcome: Not Progressing   Problem: Safety: Goal: Ability to remain free from injury will improve Outcome: Not Progressing   Problem: Skin Integrity: Goal: Risk for impaired skin integrity will decrease Outcome: Not Progressing   Problem: Education: Goal: Ability to describe self-care measures that may prevent or decrease complications (Diabetes Survival Skills Education) will improve Outcome: Not Progressing Goal: Individualized Educational Video(s) Outcome: Not Progressing   Problem: Coping: Goal: Ability to adjust to condition or change in health will improve Outcome: Not Progressing   Problem: Fluid Volume: Goal: Ability to maintain a balanced intake and output will improve Outcome: Not Progressing   Problem: Health Behavior/Discharge Planning: Goal: Ability to identify and utilize available resources and services will improve Outcome: Not Progressing Goal: Ability to manage health-related needs will improve Outcome: Not Progressing   Problem: Metabolic: Goal: Ability to maintain appropriate glucose levels will improve Outcome: Not Progressing   Problem: Nutritional: Goal: Maintenance of adequate nutrition will improve Outcome: Not Progressing Goal: Progress toward achieving an optimal weight will improve Outcome: Not Progressing   Problem: Skin Integrity: Goal: Risk for impaired skin integrity will decrease Outcome: Not Progressing   Problem: Tissue Perfusion: Goal: Adequacy of tissue perfusion will improve Outcome: Not Progressing   Problem: Safety: Goal: Non-violent  Restraint(s) Outcome: Not Progressing

## 2024-07-01 NOTE — Consult Note (Addendum)
 Consultation Note Date: 07/01/2024   Patient Name: Martin [name]  DOB: [birth year]  MRN: [id number]  Age / Sex: 68 y.o., male  PCP: [facility] Referring Physician: Willette Adriana LABOR, MD  Reason for Consultation: Establishing goals of care  HPI/Patient Profile: 68 y.o. male  with past medical history of Ogilvie syndrome, legal blindness, deafness, hypothyroidism, gout, chronic constipation admitted on 06/28/2024 with nausea/vomiting/abdominal pain/decreased p.o. intake.   Workup revealed small bowel obstruction with possible internal hernia/Ogilvie syndrome/history of abdominal surgery including colectomy.  Patient underwent exploratory laparotomy which revealed small bowel obstruction with volvulus secondary to adhesions.  Lysis of adhesions was performed.  Patient remained hemodynamically stable throughout the case.  He was kept intubated postprocedure for airway protection.  Had issues with agitation maxed out on Precedex  and fentanyl  was still having episodes of agitation and pulling on lines.  Was sedated with Versed .  He was extubated on 06/30/2024 then required immediate reintubation.  He was then reextubated on 07/01/2024.  I have also reviewed prior hospital encounters and nursing home specialist notes.  It appears that patient has a significant history of agitation and refusals of certain medical therapies.  PMT has been consulted to assist with goals of care conversation.  Labs independently reviewed.  Mild hypokalemia noted.  Trend.  Mild hypoglycemia noted.  Renal function stable.  Noted to have mild hypocalcemia in the setting of hypoalbuminemia.  Calcium corrects to 9.2.  Albumin level 2.5 and total protein 5.6.  Low albumin levels are associated with greater disease burden and poorer long-term prognosis.  Total bilirubin elevated at 1.3 other transaminases normal.  Recommend trending.  CBC reviewed.  White count normal.  Hemoglobin relatively stable  (current 9.9 g/dL<<9.4<<12.3<<13.8-on admission).  Low levels expected after surgical intervention but they are improving therefore recommend continuing to trend.  Some progressive thrombocytopenia.  Total bilirubin elevated.  Would recommend continued monitoring and monitoring for spontaneous bleed as platelet count from today is 116.  Vital signs reviewed and reveal some variable blood pressure readings including elevated SBP readings in the 160s to 170s as well as hypotension in the 80s to 90s. Heart rate stable.  Respiratory rate stable.  O2 sats appear stable on O2 via nasal cannula postextubation.  Medication administration record reviewed.  Patient remains on Precedex  and fentanyl  drip for agitation.  He is quite sedated.  Also continues to require vasopressor support.  As needed symptom med review on 24-hour look back: 1 dose of Xanax  0.5 mg / 24 hours, 1 bolus of 100 mics of fentanyl  given, 2 doses of a total of 2 mg of IV Versed  given for agitation.  Otherwise no other symptom meds appear to have been given.  Today, patient is very drowsy and it is difficult for him to remain awake for even a few seconds.  He is receiving sedation.  He indicates he does not wish to speak with this nurse practitioner today.    Given patient's drowsiness, independent history obtained from nursing staff and filing with interpreters who have been working with patient for a long time.  I spoke at length with sign language interpreter at bedside who is familiar with patient and his case as well as another sign language interpreter via phone who knows him well and has been an advocate for him in the past.  I also spoke with a legal guardian who is listed on patient's chart however, he shares another Child psychotherapist is currently following.  Nursing staff report that patient is drowsy  but appears to be tolerating extubation reasonably well at this point.  He does have some intermittent agitation that was previously noted but is  stable on current levels of sedation.  Clinical Assessment and Goals of Care:  I have reviewed medical records including EPIC notes, labs and imaging (independently reviewed), vital signs, medication administration record, assessed the patient and then met with sign language interpreters to discuss diagnosis prognosis, GOC, EOL wishes, disposition and options. Collaborated directly with attending physician, bedside nursing staff, TOC, as well as case worker listed as legal guardian and two sign language interpreters who are familiar with patient.   I introduced Palliative Medicine as specialized medical care for people living with serious illness. It focuses on providing relief from the symptoms and stress of a serious illness. The goal is to improve quality of life for both the patient and the family.  We discussed a brief life review of the patient and then focused on their current illness.   Discussion:  Unfortunately, I am unable to speak at length with patient regarding his interpretation of his medical illness, goals, etc.  However, I was able to have an extensive discussion with 2 members of the patient's healthcare team who are very familiar with him (sign language interpreters -Oneil and Howard Balloon).  Particularly Ms. Balloon has an extensive knowledge of patient as she has worked with him extensively in the past and has served as an Administrator, arts for him.  They share that patient often indicates a desire to move out of the nursing home and live on his own.  He wants to make his own decisions and that is very important to him.  He is very distrusting of the medical system.  As it relates to the nursing home care, he becomes significantly frustrated with any attempts to obtain labs or vitals in his nursing facility and reports this bothers him quite significantly.  He also has some issues with declining medication however, they do note that it depends on the approach of the individual offering the  medication and whether or not an interpreter is available.  There have been some attempts in the past at performing therapy for which he has been only variably agreeable.  He prefers hospitals and care providers in the [place] area versus the [place] area.  I spoke with his interpreters that we are very concerned about his health and overall prognosis at this time.  Shared how critically ill he is at this point.  Discussed our hope that his health but it is possible that his health could deteriorate and could deteriorate quickly, therefore, we are hopeful to know his goals and wishes. We discussed the limitations and potential burdens of CPR and intubation, particularly in the context of those with serious underlying health conditions.  Need to further discuss with patient and legal guardian. Larnell Clover aware of the possibility of change to code status depending on patient's desires. Given his desire to avoid labs and vital signs reading and be left alone   As well as current prognosis, Detailed explanation of hospice provided including the hospice care philosophy, emphasizing its commitment to comfort, enhancing quality of life, and providing emotional and practical support to patients and their families during end-of-life care. Reviewed eligibility requirements and discussed the potential advantages of enrolling in hospice services.  However, he may not qualify for it as he has previously expressed a desire to pursue curative intervention such as eye surgery etc.  Unfortunately, unable to speak to patient  and even if we were able to he has a legal guardian.  The patient himself is reportedly very upset with having a legal guardian and prefers to make his own medical decisions.  I assured the interpreters that we will do our best to honor his wishes however we do have to follow current legal guidelines as it relates to individuals who have an assigned guardian.  I spoke with patient's legal guardian as  listed in the chart (Joe Suggs).  Unfortunately he is no longer the patient's guardian he does however provide me with current legal guardian's name and phone number.  Unable to connect with this individual.  Questions and concerns were addressed.  The patient and interpreters were encouraged to call with questions or concerns.  PMT will continue to support holistically.  Primary Decision maker and health care surrogate:  LEGAL GUARDIAN  Code Status:  Full code    SUMMARY OF RECOMMENDATIONS    Maintain full code/full scope Time for outcomes Continue current symptom regimen as he appears comfortable at present Consider weaning sedating meds as able to allow adequate goals of care discussion Palliative medicine team will continue to follow for ongoing goals of care discussion, symptom management, and coordination of care.   Code Status/Advance Care Planning: Full code   Symptom Management:  Symptom management per attending team with PMT available as needed   Prognosis:  Guarded  Discharge Planning: To Be Determined      Primary Diagnoses: Present on Admission:  SBO (small bowel obstruction) (HCC)  Adjustment disorder with mixed anxiety and depressed mood  CKD (chronic kidney disease)  Hypothyroidism, unspecified  Legally blind  Ogilvie's syndrome    Physical Exam Constitutional:      General: He is not in acute distress. Pulmonary:     Effort: Pulmonary effort is normal. No respiratory distress.  Skin:    General: Skin is warm and dry.  Neurological:     Mental Status: He is lethargic.     Vital Signs: BP (!) 148/102   Pulse (!) 55   Temp 99.4 F (37.4 C) (Axillary)   Resp 10   Ht 6' 1 (1.854 m)   Wt 55.9 kg   SpO2 100%   BMI 16.26 kg/m  Pain Scale: CPOT POSS *See Group Information*: 2-Acceptable,Slightly drowsy, easily aroused     SpO2: SpO2: 100 % O2 Device:SpO2: 100 % O2 Flow Rate: .O2 Flow Rate (L/min): 15 L/min   Palliative  Assessment/Data: 40% currently     Billing based on MDM: High  Problems Addressed: One acute or chronic illness or injury that poses a threat to life or bodily function  Amount and/or Complexity of Data: Category 1:Assessment requiring an independent historian(s) and Category 3:Discussion of management or test interpretation with external physician/other qualified health care professional/appropriate source (not separately reported)  Risks: n/a   Laymon CHRISTELLA Pinal, NP  Palliative Medicine Team Team phone # [phone]  Thank you for allowing the Palliative Medicine Team to assist in the care of this patient. Please utilize secure chat with additional questions, if there is no response within 30 minutes please call the above phone number.  Palliative Medicine Team providers are available by phone from 7am to 7pm daily and can be reached through the team cell phone.  Should this patient require assistance outside of these hours, please call the patient's attending physician.

## 2024-07-01 NOTE — Progress Notes (Signed)
 eLink Physician-Brief Progress Note Patient Name: Baldo Hufnagle DOB: 1956/02/18 MRN: [id number]   Date of Service  07/01/2024  HPI/Events of Note  Notified of K 3.3 Creatinine 0.76 Ongoing LR with 20 meqs K  eICU Interventions  Ordered a one time dose of KCl 40 meqs     Intervention Category Intermediate Interventions: Electrolyte abnormality - evaluation and management  Damien ONEIDA Grout 07/01/2024, 8:00 PM

## 2024-07-01 NOTE — Anesthesia Postprocedure Evaluation (Signed)
 Anesthesia Post Note  Patient: Martin [name]  Procedure(s) Performed: LAPAROTOMY, EXPLORATORY (Abdomen) LAPAROTOMY, FOR LYSIS OF ADHESIONS (Abdomen)  Patient location during evaluation: Phase II Anesthesia Type: General Level of consciousness: awake Pain management: pain level controlled Vital Signs Assessment: post-procedure vital signs reviewed and stable Respiratory status: spontaneous breathing and respiratory function stable Cardiovascular status: blood pressure returned to baseline and stable Postop Assessment: no headache and no apparent nausea or vomiting Anesthetic complications: no Comments: Late entry   No notable events documented.   Last Vitals:  Vitals:   07/01/24 0716 07/01/24 0800  BP:    Pulse:    Resp:    Temp:  37.4 C  SpO2: 100%     Last Pain:  Vitals:   07/01/24 0800  TempSrc: Axillary                 Yvonna JINNY Bosworth

## 2024-07-01 NOTE — Procedures (Signed)
 Extubation Procedure Note  Patient Details:   Name: Martin [name] DOB: October 06, 1956 MRN: [id number]   Airway Documentation:  Airway (Active)  Secured at (cm) 23 cm 07/01/24 0716  Measured From Lips 07/01/24 0716  Secured Location Center 07/01/24 0716  Secured By [employer] 07/01/24 0716  Tube Holder Repositioned Yes 07/01/24 0716  Cuff Pressure (cm H2O) Green OR 18-26 [facility] 07/01/24 0716  Site Condition Dry 07/01/24 0716   Vent end date: 06/30/24 Vent end time: 1255   Evaluation  O2 sats: stable throughout Complications: No apparent complications Patient did tolerate procedure well. Bilateral Breath Sounds: Clear   No  Patient extubated without any complications and placed on 3L nasal cannula. Patient is nonverbal but is able to sign to interpreter that he feels fine. MD and RN aware.  [name] C 07/01/2024, 10:48 AM

## 2024-07-01 NOTE — Progress Notes (Signed)
 PROGRESS NOTE    Patient: Martin [name]                            PCP: [facility]                    DOB: Jun 14, 1956            DOA: 06/28/2024 FMW:[id number]             DOS: 07/01/2024, 11:31 AM   LOS: 3 days   Date of Service: The patient was seen and examined on 07/01/2024  Subjective:   06/30/2024 afternoon failed extubation patient became hypoxic lethargic had to be reintubated-  Patient was seen and examined, airway was secured and intubated by ED doctor, sedation order along with pressors were restarted.  07/01/2024-patient was found stable, proceeded with extubation, tolerated sedation well, currently awake alert communicating through the interpreter - Weaning off propofol , Precedex , Levophed  - Continue O2 via nasal cannula, NG tube still in place  Brief Narrative:   Martin [name] is a 68 y.o. 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  Patient history obtained through interpreter.  He has been having increased vomiting over the last 24 hours.  He does complain of abdominal pain but is unable to specify exact location of it.  He has not been eating and has had decreased output through his ostomy bag.  Due to all of this, he was brought to the hospital for evaluation.  He does have a history of refusing treatments.     Assessment & Plan:   Principal Problem:   SBO (small bowel obstruction) (HCC) Active Problems:   Legally blind   Deaf   Hypothyroidism, unspecified   Adjustment disorder with mixed anxiety and depressed mood   Ogilvie's syndrome   CKD (chronic kidney disease)     Small bowel obstruction with possible internal hernia/ Ogilvie syndrome/ History of abdominal surgery including colectomy  POD # 3 - Failed extubation attempt 06/30/2024 hide to be reintubated.  07/01/2024 -Pressors and sedatives were weaned off - Patient was successfully  extubated.  Interpreter at bedside Currently hemodynamically stable  -Stable on the vent overnight   -NG to LIS, -continue for bowel decompression -NPO--- will remain n.p.o. with slow progression per surgery recommendation - Continue IVF today - Continue monitor I's and O's - foley catheter  Intake/Output Summary (Last 24 hours) at 07/01/2024 1131 Last data filed at 07/01/2024 0946 Gross per 24 hour  Intake 2370.27 ml  Output 2725 ml  Net -354.73 ml   -Continue with ABG as needed, DuoNeb bronchodilators, routine suction, routine oral care - NG still in place will manage accordingly  Sedated; - Off sedative meds, tapering off Precedex   Pressure support - IV Levophed  drip with titration for MAP of > 60 - Weaning off Levophed  blood pressure improving   High possibility of self extubation, pulling NG tube out, will remain sedated for now  Surgical abdominal wound-clean, colostomy present -40 edema erythema or discharge  Chronic kidney disease Stable at base line  Lab Results  Component Value Date   CREATININE 0.76 07/01/2024   CREATININE 0.99 06/30/2024   CREATININE 0.91 06/29/2024     Hypothyroidism Resuming levothyroxine    Elevated blood sugar Check hemoglobin A1c CBGs every 4 hours with SSI coverage    Visual and hearing impairments - Apparently patient committed through  some kind interpreter - Partial visual acuity 1 eye still present    --------------------------------------------------------------------------------------------------------------------------------- Nutritional status:  The patient's BMI is: Body mass index is 16.26 kg/m. I agree with the assessment and plan as outlined   ---------------------------------------------------------------------------------------------------------------------------------  DVT prophylaxis:  enoxaparin  (LOVENOX ) injection 40 mg Start: 06/28/24 2230   Code Status:   Code Status: Full Code  Family  Communication:  -Advance care planning has been discussed.   Admission status:   Status is: Inpatient Remains inpatient appropriate because: post op Care    Disposition: From  -SNF, will return in 2 days Pending bowel function return  Procedures:   No admission procedures for hospital encounter.   Antimicrobials:  Anti-infectives (From admission, onward)    Start     Dose/Rate Route Frequency Ordered Stop   06/28/24 1929  ceFAZolin  (ANCEF ) 2-4 GM/100ML-% IVPB       Note to Pharmacy: Joshua Planas S: cabinet override      06/28/24 1929 06/28/24 2258        Medication:   acetaminophen  (TYLENOL ) oral liquid 160 mg/5 mL  1,000 mg Per Tube Q6H   bisacodyl   10 mg Rectal Daily   brimonidine   1 drop Left Eye TID   Chlorhexidine  Gluconate Cloth  6 each Topical Daily   docusate  100 mg Per Tube BID   dorzolamide -timolol   1 drop Both Eyes BID   enoxaparin  (LOVENOX ) injection  40 mg Subcutaneous Q24H   mouth rinse  15 mL Mouth Rinse Q2H   pantoprazole  (PROTONIX ) IV  40 mg Intravenous Q12H   polyethylene glycol  17 g Per Tube Daily   senna-docusate  1 tablet Per NG tube BID    ALPRAZolam , fentaNYL  (SUBLIMAZE ) injection, fentaNYL  (SUBLIMAZE ) injection, hydrALAZINE , ondansetron  (ZOFRAN ) IV, ondansetron , mouth rinse   Objective:   Vitals:   07/01/24 1015 07/01/24 1030 07/01/24 1045 07/01/24 1100  BP: (!) 135/45 (!) 161/44 (!) 170/55 (!) 129/46  Pulse: 75 99    Resp: 17 18 (!) 23 (!) 21  Temp:      TempSrc:      SpO2: 98% 100%    Weight:      Height:        Intake/Output Summary (Last 24 hours) at 07/01/2024 1131 Last data filed at 07/01/2024 0946 Gross per 24 hour  Intake 2370.27 ml  Output 2725 ml  Net -354.73 ml   Filed Weights   06/29/24 0500 06/30/24 0426 07/01/24 0435  Weight: 52.9 kg 53.6 kg 55.9 kg     Physical examination:      General:  Awake alert post extubation, communicating through the interpreter  HEENT:  Blind and deaf-ET tube extracted   Neuro:  Limited exam patient is blind and deaf  Moving all 4 extremities, sensation intact, motor seem to be intact   Lungs:   Clear to auscultation BL, Respirations unlabored,  No wheezes / crackles  Cardio:    S1/S2, RRR, No murmure, No Rubs or Gallops   Abdomen:  Soft, non-tender, bowel sounds active all four quadrants, no guarding or peritoneal signs. Ostomy in place, functional, lower abdominal surgical wound clean, negative for erythema or edema or drainage  Muscular  skeletal:  Limited exam -global generalized weaknesses - in bed, able to move all 4 extremities,   2+ pulses,  symmetric, No pitting edema  Skin:  Dry, warm to touch, surgical lower abdominal wound, with dressing in place, ostomy in place  Wounds: Surgical lower abdominal wound, negative erythema edema or drainage             ------------------------------------------------------------------------------------------------------------------------------------------  LABs:     Latest Ref Rng & Units 07/01/2024    7:44 AM 06/30/2024    2:11 PM 06/29/2024    2:35 AM  CBC  WBC 4.0 - 10.5 K/uL 6.4  7.0  9.9   Hemoglobin 13.0 - 17.0 g/dL 9.9  9.4  87.6   Hematocrit 39.0 - 52.0 % 31.9  30.1  37.7   Platelets 150 - 400 K/uL 116  135  189       Latest Ref Rng & Units 07/01/2024    7:44 AM 06/30/2024    2:11 PM 06/29/2024    1:30 AM  CMP  Glucose 70 - 99 mg/dL 899  849  868   BUN 8 - 23 mg/dL 11  14  21    Creatinine 0.61 - 1.24 mg/dL 9.23  9.00  9.08   Sodium 135 - 145 mmol/L 138  140  143   Potassium 3.5 - 5.1 mmol/L 3.3  3.6  4.2   Chloride 98 - 111 mmol/L 104  107  107   CO2 22 - 32 mmol/L 23  24  24    Calcium 8.9 - 10.3 mg/dL 8.0  8.1  8.6   Total Protein 6.5 - 8.1 g/dL 5.6  5.6    Total Bilirubin 0.0 - 1.2 mg/dL 1.3  0.9    Alkaline Phos 38 - 126 U/L 72  70    AST 15 - 41 U/L 22  22    ALT 0 - 44 U/L 10  8         Micro Results Recent Results (from the past 240 hours)  MRSA Next Gen by PCR,  Nasal     Status: None   Collection Time: 06/28/24  9:37 PM   Specimen: Nasal Mucosa; Nasal Swab  Result Value Ref Range Status   MRSA by PCR Next Gen NOT DETECTED NOT DETECTED Final    Comment: (NOTE) The GeneXpert MRSA Assay (FDA approved for NASAL specimens only), is one component of a comprehensive MRSA colonization surveillance program. It is not intended to diagnose MRSA infection nor to guide or monitor treatment for MRSA infections. Test performance is not FDA approved in patients less than 29 years old. Performed at [facility], [address], [place], [place] [address]     Radiology Reports DG CHEST PORT 1 VIEW Result Date: 07/01/2024 EXAM: 1 VIEW XRAY OF THE CHEST 07/01/2024 07:28:00 AM COMPARISON: 06/30/2024 CLINICAL HISTORY: SOB (shortness of breath) [id number]. Sob FINDINGS: LINES, TUBES AND DEVICES: Endotracheal tube in place with tip 4.5 cm above the carina. Gastric tube in place with tip in stomach. LUNGS AND PLEURA: Low lung volumes. Increased patchy opacities within the right perihilar and right lower lung zones. Unchanged atelectasis in the left base. HEART AND MEDIASTINUM: No acute abnormality of the cardiac and mediastinal silhouettes. BONES AND SOFT TISSUES: Abdominal midline surgical staples noted. Improved pneumoperitoneum compared to prior. IMPRESSION: 1. Increase right perihilar and right lower lung opacities with stable atelectasis in the left base. 2. Improved pneumoperitoneum compared to prior. 3. Low lung volumes. Electronically signed by: Waddell Calk MD 07/01/2024 07:43 AM EDT RP Workstation: [id number]   DG CHEST PORT 1 VIEW Result Date: 06/30/2024 CLINICAL DATA:  Difficult airway for intubation. EXAM: PORTABLE CHEST 1 VIEW COMPARISON:  06/30/2024. FINDINGS: The heart size and mediastinal contours are stable. The endotracheal tube terminates 3.3 cm above the carina. Lung volumes are low with basilar atelectasis is. There is elevation of the left diaphragm. No  consolidation or  effusion is seen. There are subdiaphragmatic lucencies bilaterally which are unchanged from the previous exam. The stomach is distended with air. A nasogastric tube terminates in the stomach. No acute osseous abnormality. IMPRESSION: 1. Endotracheal tube terminates 3.3 cm above the carina. 2. Low lung volumes with basilar atelectasis. 3. Stable pneumoperitoneum with gaseous distension of the stomach, which may be related to recent surgery. Electronically Signed   By: Leita Birmingham M.D.   On: 06/30/2024 14:20    SIGNED: Adriana DELENA Grams, MD, FHM. FAAFP. [facility] - [facility] Critical care time spent - 75 min.  In seeing, evaluating and examining the patient. Reviewing medical records, labs, drawn plan of care. [facility],  Pager (please use [email or web address] to page/ text) Please use Epic Secure Chat for non-urgent communication (7AM-7PM)  If 7PM-7AM, please contact night-coverage [email or web address], 07/01/2024, 11:31 AM

## 2024-07-02 DIAGNOSIS — K56609 Unspecified intestinal obstruction, unspecified as to partial versus complete obstruction: Secondary | ICD-10-CM | POA: Diagnosis not present

## 2024-07-02 LAB — GLUCOSE, CAPILLARY
Glucose-Capillary: 101 mg/dL — ABNORMAL HIGH (ref 70–99)
Glucose-Capillary: 103 mg/dL — ABNORMAL HIGH (ref 70–99)
Glucose-Capillary: 134 mg/dL — ABNORMAL HIGH (ref 70–99)
Glucose-Capillary: 136 mg/dL — ABNORMAL HIGH (ref 70–99)
Glucose-Capillary: 143 mg/dL — ABNORMAL HIGH (ref 70–99)
Glucose-Capillary: 154 mg/dL — ABNORMAL HIGH (ref 70–99)

## 2024-07-02 MED ORDER — IBUPROFEN 400 MG PO TABS
400.0000 mg | ORAL_TABLET | Freq: Once | ORAL | Status: DC
  Filled 2024-07-02: qty 1

## 2024-07-02 MED ORDER — SODIUM CHLORIDE 0.9 % IV SOLN
12.5000 mg | Freq: Four times a day (QID) | INTRAVENOUS | Status: DC | PRN
  Administered 2024-07-03 (×2): 12.5 mg via INTRAVENOUS
  Filled 2024-07-02 (×2): qty 0.5

## 2024-07-02 MED ORDER — ORAL CARE MOUTH RINSE
15.0000 mL | OROMUCOSAL | Status: DC
  Administered 2024-07-03 (×2): 15 mL via OROMUCOSAL

## 2024-07-02 MED ORDER — ORAL CARE MOUTH RINSE: 15.0000 mL | OROMUCOSAL | Status: DC | PRN

## 2024-07-02 NOTE — Progress Notes (Signed)
 [facility]  Interpretor in room and helping communicate. Overnight issue with restraints that patient is upset about.  BP (!) 115/58   Pulse 68   Temp 97.9 F (36.6 C) (Axillary)   Resp 16   Ht 6' 1 (1.854 m)   Wt 55 kg   SpO2 98%   BMI 16.00 kg/m  Soft, mildly distended, ostomy with some protrusion, pink, minor liquid stool in bag Midline with honeycomb and minor old staining   Patient s/p Ex lap, LOA. Limited communication due to deaf, partial blindness, limited interpretor availability. NG replacement would be difficult in the patient.   NG to gravity for now Clear liquid diet Will see how he does   Manuelita Pander, MD [facility] [address] [place], [place] [address] [phone] (office)

## 2024-07-02 NOTE — Plan of Care (Signed)
  Problem: Education: Goal: Knowledge of General Education information will improve Description: Including pain rating scale, medication(s)/side effects and non-pharmacologic comfort measures Outcome: Progressing   Problem: Clinical Measurements: Goal: Ability to maintain clinical measurements within normal limits will improve Outcome: Progressing Goal: Will remain free from infection Outcome: Progressing Goal: Diagnostic test results will improve Outcome: Progressing Goal: Respiratory complications will improve Outcome: Progressing Goal: Cardiovascular complication will be avoided Outcome: Progressing   Problem: Activity: Goal: Risk for activity intolerance will decrease Outcome: Progressing   Problem: Coping: Goal: Level of anxiety will decrease Outcome: Progressing   Problem: Elimination: Goal: Will not experience complications related to bowel motility Outcome: Progressing Goal: Will not experience complications related to urinary retention Outcome: Progressing   Problem: Pain Managment: Goal: General experience of comfort will improve and/or be controlled Outcome: Progressing   Problem: Safety: Goal: Ability to remain free from injury will improve Outcome: Progressing

## 2024-07-02 NOTE — Progress Notes (Signed)
 Had discussion with interpreter and patient at bedside. Attempted giving meds. He was ok with the eye drops and tylenol . Agreeable at first to the rest. After disconnecting the line to flush it and give the protonix  he signed enough and he was very frustrated. He would not let me reconnect the lines for precedex  or LR w/ potassium. Also would not let me give the NG meds. We explained all of the meds the best we could throughout the encounter. Mostly he is upset about what happened lastnight without an interpreter at the bedside. None of the nurses can communicate with him so he gets progressively frustrated until he's full blown agitated. Because he doesn't want any of this being done to him, understandably. The interpreter who has worked with him many times said this is a cycle that he goes through everywhere he goes. He needs an interpreter with him 24/7, he does not trust staff to do things to him. Due to the lack of understanding and communication. He did not mean to swing at staff last night he just has no idea what's going on and people keep trying to do things to  him. He explained. The goal is to have an interpreter here over night tonight and continuously going forward. Precedex  and potassium fluids remain off. Pt is calm but frustrated.

## 2024-07-02 NOTE — Progress Notes (Signed)
 ASL Interpreter at bedside. Patient refusing medication and insisting to speak with physician. Patient requesting BC powder, Advil  & a pink liquid to help with his problems.   Dr. Diego came to bedside and explained to patient we need to rest his bowel if he is nauseous and feels like he is going to vomit. Patient agreed to IV medications only.   Patient's abdomen is slightly distend, but soft to palpation. Has active bowel sounds & ostomy is producing little stool and gas. Midline incision is clean and intact. Patient frequently coughs up spit, but true gagging or vomit has not been observed by this Investment banker, operational.

## 2024-07-02 NOTE — Progress Notes (Signed)
   Brief Palliative Medicine Progress Note:  PMT consult received and chart reviewed. No translator at bedside so unable to complete GOC discussion with patient today. Will attempt to coordinate time to have interpretor available so GOC can be completed.  Recommendations: Full code/full scope for now Continue aggressive medical therapies Ultimately will d/c back to SNF   Thank you for allowing us  to participate in the care of Martin [name]  Signed by: Laymon Pinal, NP Palliative Medicine Team [facility] CHARGE  Team Phone # [phone] (Nights/Weekends)  07/02/2024, 12:12 PM

## 2024-07-02 NOTE — Progress Notes (Signed)
 PROGRESS NOTE    Patient: Martin [name]                            PCP: [facility]                    DOB: 1955-12-25            DOA: 06/28/2024 FMW:[id number]             DOS: 07/02/2024, 11:41 AM   LOS: 4 days   Date of Service: The patient was seen and examined on 07/02/2024  Subjective:   06/30/2024 afternoon failed extubation patient became hypoxic lethargic had to be reintubated-  Patient was seen and examined, airway was secured and intubated by ED doctor, sedation order along with pressors were restarted.  07/01/2024-patient was found stable, proceeded with extubation, tolerated sedation well, currently awake alert communicating through the interpreter - Weaning off propofol , Precedex , Levophed  - Continue O2 via nasal cannula, NG tube still in place   07/02/2024 patient was seen and examined with interpreter at bedside  -remained stable postextubation -satting 98% on 2 L NG tube in place -n.p.o.  Some agitation-aggression overnight   Brief Narrative:   Martin [name] is a 68 y.o. 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  Patient history obtained through interpreter.  He has been having increased vomiting over the last 24 hours.  He does complain of abdominal pain but is unable to specify exact location of it.  He has not been eating and has had decreased output through his ostomy bag.  Due to all of this, he was brought to the hospital for evaluation.  He does have a history of refusing treatments.     Assessment & Plan:   Principal Problem:   SBO (small bowel obstruction) (HCC) Active Problems:   Legally blind   Deaf   Hypothyroidism, unspecified   Adjustment disorder with mixed anxiety and depressed mood   Ogilvie's syndrome   CKD (chronic kidney disease)     Small bowel obstruction with possible internal hernia/ Ogilvie syndrome/ History of  abdominal surgery including colectomy  POD # 4 -exploratory laparotomy with no resection  - Failed extubation attempt 06/30/2024 hide to be reintubated. - 07/01/24 -extubated successfully-remain on 2 L, stable   - Hemodynamically stable, NG tube in place - Surgery following, started clear liquid diet today   -NG to LIS, -continue for bowel decompression - Diet : with slow progression per surgery recommendation -Gentle IVF - Continue monitor I's and O's - foley catheter  Intake/Output Summary (Last 24 hours) at 07/02/2024 1141 Last data filed at 07/02/2024 1053 Gross per 24 hour  Intake 3697.26 ml  Output 1885 ml  Net 1812.26 ml   -Continue with ABG as needed, DuoNeb bronchodilators, routine suction, routine oral care - NG still in place will manage accordingly  -Tapering off Precedex , off Levophed  Will monitor closely   Surgical abdominal wound-clean, colostomy present - No edema erythema or discharge  Chronic kidney disease Stable at base line  Lab Results  Component Value Date   CREATININE 0.76 07/01/2024   CREATININE 0.99 06/30/2024   CREATININE 0.91 06/29/2024     Hypothyroidism Resuming levothyroxine    Elevated blood sugar Check hemoglobin A1c CBGs every 4 hours with SSI coverage    Visual and hearing impairments - Apparently patient committed through some kind  interpreter - Partial visual acuity 1 eye still present  ------------------------------------------------------------------------------------------------------- Nutritional status:  The patient's BMI is: Body mass index is 16 kg/m. I agree with the assessment and plan as outlined   ---------------------------------------------------------------------------------------------------------------------------------  DVT prophylaxis:  enoxaparin  (LOVENOX ) injection 40 mg Start: 06/28/24 2230   Code Status:   Code Status: Full Code  Family Communication:  -Advance care planning has been  discussed.   Admission status:   Status is: Inpatient Remains inpatient appropriate because: post op Care    Disposition: From  -SNF, will return in 2 days Pending bowel function return  Procedures:   No admission procedures for hospital encounter.   Antimicrobials:  Anti-infectives (From admission, onward)    Start     Dose/Rate Route Frequency Ordered Stop   06/28/24 1929  ceFAZolin  (ANCEF ) 2-4 GM/100ML-% IVPB       Note to Pharmacy: Joshua Planas S: cabinet override      06/28/24 1929 06/28/24 2258        Medication:   acetaminophen  (TYLENOL ) oral liquid 160 mg/5 mL  1,000 mg Per Tube Q6H   bisacodyl   10 mg Rectal Daily   brimonidine   1 drop Left Eye TID   Chlorhexidine  Gluconate Cloth  6 each Topical Daily   docusate  100 mg Per Tube BID   dorzolamide -timolol   1 drop Both Eyes BID   enoxaparin  (LOVENOX ) injection  40 mg Subcutaneous Q24H   mouth rinse  15 mL Mouth Rinse 4 times per day   pantoprazole  (PROTONIX ) IV  40 mg Intravenous Q12H   polyethylene glycol  17 g Per Tube Daily   senna-docusate  1 tablet Per NG tube BID    ALPRAZolam , fentaNYL  (SUBLIMAZE ) injection, fentaNYL  (SUBLIMAZE ) injection, hydrALAZINE , Influenza vac split trivalent PF, ondansetron  (ZOFRAN ) IV, ondansetron , mouth rinse, pneumococcal 20-valent conjugate vaccine   Objective:   Vitals:   07/02/24 0610 07/02/24 0700 07/02/24 0736 07/02/24 0800  BP: (!) 112/48 (!) 110/59  (!) 115/58  Pulse: 80 61  68  Resp: 16 19 16    Temp:   97.9 F (36.6 C)   TempSrc:   Axillary   SpO2: 95% 98%  98%  Weight:      Height:        Intake/Output Summary (Last 24 hours) at 07/02/2024 1141 Last data filed at 07/02/2024 1053 Gross per 24 hour  Intake 3697.26 ml  Output 1885 ml  Net 1812.26 ml   Filed Weights   06/30/24 0426 07/01/24 0435 07/02/24 0405  Weight: 53.6 kg 55.9 kg 55 kg     Physical examination:      Interpreter present at bedside General:  AAO x 3,  cooperative, no distress;    HEENT:  Limited exam deaf and blind  Neuro:  Limited exam due to poor communication secondary to being blind and deaf CNII-XII intact. , normal motor and sensation, reflexes intact   Lungs:   Clear to auscultation BL, Respirations unlabored,  No wheezes / crackles  Cardio:    S1/S2, RRR, No murmure, No Rubs or Gallops   Abdomen:  Colostomy present, with minimal residual Lower abdominal surgical wound clean, dressing in place Soft, non-tender, bowel sounds active all four quadrants, no guarding or peritoneal signs.  Muscular  skeletal:  Limited exam -global generalized weaknesses - in bed, able to move all 4 extremities,   2+ pulses,  symmetric, No pitting edema  Skin:  Dry, warm to touch, abdominal surgical wound, with colostomy in place  Wounds: Postsurgical wound lower abdominal  dressing in place, mild tenderness today for erythema edema or drainage         Wounds: Surgical lower abdominal wound, negative erythema edema or drainage             ------------------------------------------------------------------------------------------------------------------------------------------    LABs:     Latest Ref Rng & Units 07/01/2024    7:44 AM 06/30/2024    2:11 PM 06/29/2024    2:35 AM  CBC  WBC 4.0 - 10.5 K/uL 6.4  7.0  9.9   Hemoglobin 13.0 - 17.0 g/dL 9.9  9.4  87.6   Hematocrit 39.0 - 52.0 % 31.9  30.1  37.7   Platelets 150 - 400 K/uL 116  135  189       Latest Ref Rng & Units 07/01/2024    7:44 AM 06/30/2024    2:11 PM 06/29/2024    1:30 AM  CMP  Glucose 70 - 99 mg/dL 899  849  868   BUN 8 - 23 mg/dL 11  14  21    Creatinine 0.61 - 1.24 mg/dL 9.23  9.00  9.08   Sodium 135 - 145 mmol/L 138  140  143   Potassium 3.5 - 5.1 mmol/L 3.3  3.6  4.2   Chloride 98 - 111 mmol/L 104  107  107   CO2 22 - 32 mmol/L 23  24  24    Calcium 8.9 - 10.3 mg/dL 8.0  8.1  8.6   Total Protein 6.5 - 8.1 g/dL 5.6  5.6    Total Bilirubin 0.0 - 1.2 mg/dL 1.3  0.9    Alkaline Phos 38 - 126  U/L 72  70    AST 15 - 41 U/L 22  22    ALT 0 - 44 U/L 10  8         Micro Results Recent Results (from the past 240 hours)  MRSA Next Gen by PCR, Nasal     Status: None   Collection Time: 06/28/24  9:37 PM   Specimen: Nasal Mucosa; Nasal Swab  Result Value Ref Range Status   MRSA by PCR Next Gen NOT DETECTED NOT DETECTED Final    Comment: (NOTE) The GeneXpert MRSA Assay (FDA approved for NASAL specimens only), is one component of a comprehensive MRSA colonization surveillance program. It is not intended to diagnose MRSA infection nor to guide or monitor treatment for MRSA infections. Test performance is not FDA approved in patients less than 103 years old. Performed at [facility], [address] [place], [place] [address]     Radiology Reports No results found.   SIGNED: Adriana DELENA Grams, MD, FHM. FAAFP. Jolynn Pack - [facility] Critical care time spent - 75 min.  In seeing, evaluating and examining the patient. Reviewing medical records, labs, drawn plan of care. [facility],  Pager (please use [email or web address] to page/ text) Please use Epic Secure Chat for non-urgent communication (7AM-7PM)  If 7PM-7AM, please contact night-coverage [email or web address], 07/02/2024, 11:41 AM

## 2024-07-02 NOTE — Progress Notes (Signed)
 Patient O2 sats 88-92 on room air. Pt refusing nasal cannula. Interpreter at bedside.Tried to place it on and he took it off. MD notified.

## 2024-07-03 ENCOUNTER — Inpatient Hospital Stay (HOSPITAL_COMMUNITY)

## 2024-07-03 DIAGNOSIS — H548 Legal blindness, as defined in USA: Secondary | ICD-10-CM

## 2024-07-03 DIAGNOSIS — Z7189 Other specified counseling: Secondary | ICD-10-CM | POA: Diagnosis not present

## 2024-07-03 DIAGNOSIS — H9193 Unspecified hearing loss, bilateral: Secondary | ICD-10-CM

## 2024-07-03 DIAGNOSIS — K56609 Unspecified intestinal obstruction, unspecified as to partial versus complete obstruction: Secondary | ICD-10-CM | POA: Diagnosis not present

## 2024-07-03 DIAGNOSIS — Z789 Other specified health status: Secondary | ICD-10-CM

## 2024-07-03 DIAGNOSIS — E039 Hypothyroidism, unspecified: Secondary | ICD-10-CM

## 2024-07-03 DIAGNOSIS — Z515 Encounter for palliative care: Secondary | ICD-10-CM | POA: Diagnosis not present

## 2024-07-03 DIAGNOSIS — F4323 Adjustment disorder with mixed anxiety and depressed mood: Secondary | ICD-10-CM

## 2024-07-03 DIAGNOSIS — Z66 Do not resuscitate: Secondary | ICD-10-CM | POA: Diagnosis not present

## 2024-07-03 LAB — CBC
HCT: 35.4 % — ABNORMAL LOW (ref 39.0–52.0)
Hemoglobin: 10.9 g/dL — ABNORMAL LOW (ref 13.0–17.0)
MCH: 28.2 pg (ref 26.0–34.0)
MCHC: 30.8 g/dL (ref 30.0–36.0)
MCV: 91.7 fL (ref 80.0–100.0)
Platelets: 260 K/uL (ref 150–400)
RBC: 3.86 MIL/uL — ABNORMAL LOW (ref 4.22–5.81)
RDW: 14.7 % (ref 11.5–15.5)
WBC: 5.8 K/uL (ref 4.0–10.5)
nRBC: 0.5 % — ABNORMAL HIGH (ref 0.0–0.2)

## 2024-07-03 LAB — BASIC METABOLIC PANEL WITH GFR
Anion gap: 12 (ref 5–15)
BUN: 20 mg/dL (ref 8–23)
CO2: 24 mmol/L (ref 22–32)
Calcium: 8.4 mg/dL — ABNORMAL LOW (ref 8.9–10.3)
Chloride: 104 mmol/L (ref 98–111)
Creatinine, Ser: 1.08 mg/dL (ref 0.61–1.24)
GFR, Estimated: >60 mL/min (ref >60)
Glucose, Bld: 126 mg/dL — ABNORMAL HIGH (ref 70–99)
Potassium: 3.8 mmol/L (ref 3.5–5.1)
Sodium: 140 mmol/L (ref 135–145)

## 2024-07-03 LAB — GLUCOSE, CAPILLARY
Glucose-Capillary: 108 mg/dL — ABNORMAL HIGH (ref 70–99)
Glucose-Capillary: 118 mg/dL — ABNORMAL HIGH (ref 70–99)
Glucose-Capillary: 116 mg/dL — ABNORMAL HIGH (ref 70–99)
Glucose-Capillary: 90 mg/dL (ref 70–99)
Glucose-Capillary: 119 mg/dL — ABNORMAL HIGH (ref 70–99)
Glucose-Capillary: 107 mg/dL — ABNORMAL HIGH (ref 70–99)

## 2024-07-03 MED ORDER — ACETAMINOPHEN 10 MG/ML IV SOLN
1000.0000 mg | Freq: Once | INTRAVENOUS | Status: AC
  Administered 2024-07-03: 1000 mg via INTRAVENOUS
  Filled 2024-07-03 (×2): qty 100

## 2024-07-03 MED ORDER — SODIUM CHLORIDE 0.9 % IV SOLN
3.0000 g | Freq: Four times a day (QID) | INTRAVENOUS | Status: DC
  Administered 2024-07-03 – 2024-07-04 (×3): 3 g via INTRAVENOUS
  Filled 2024-07-03 (×8): qty 8

## 2024-07-03 MED ORDER — FUROSEMIDE 10 MG/ML IJ SOLN
20.0000 mg | Freq: Once | INTRAMUSCULAR | Status: AC
  Administered 2024-07-04: 20 mg via INTRAVENOUS
  Filled 2024-07-03: qty 2

## 2024-07-03 MED ORDER — IPRATROPIUM-ALBUTEROL 0.5-2.5 (3) MG/3ML IN SOLN
3.0000 mL | Freq: Four times a day (QID) | RESPIRATORY_TRACT | Status: DC
Start: 2024-07-04 — End: 2024-07-04
  Administered 2024-07-04 (×2): 3 mL via RESPIRATORY_TRACT
  Filled 2024-07-03 (×3): qty 3

## 2024-07-03 MED ORDER — METOPROLOL TARTRATE 5 MG/5ML IV SOLN
2.5000 mg | Freq: Four times a day (QID) | INTRAVENOUS | Status: DC
  Administered 2024-07-03 – 2024-07-04 (×2): 2.5 mg via INTRAVENOUS
  Filled 2024-07-03 (×3): qty 5

## 2024-07-03 NOTE — Significant Event (Signed)
 Notified by RN of worsening hypoxia, requiring NRB to maintain sat > 90%. On evaluation he is awake but unable to provide history due to communication barrier. His sat was initially in mid 70s because he had removed NRB, with this replaced, slowly came up to ~ 90%. He has bilateral rales, abd is hyperactive. NG in place but minimal drainage to Low intermittent suction. Clinically suspect further aspiration.   A/P:  Likely aspiration, with pneumonitis / pneumonia  AHRF, requiring NRB  -- Will try to stabilize further per below, if unable to stabilize sufficiently may require reintubation. Will discuss with EDP  -- Strict NPO, continue NG to low intermittent suction  -- CXR, Abd XR  -- Duonebs, RT to suction / Chest PT  -- place mitts on, may require wrist restraints  -- Elevate HOB  -- He is already on Unasyn , continue   Dorn Dawson, MD  [facility]

## 2024-07-03 NOTE — Progress Notes (Signed)
 [facility] Progress Note  5 Days Post-Op  Subjective: Called overnight by night RN about the ostomy prolapse. Worsening prolapse. Patient was very challenging for RN yesterday refused to keep NG tube despite our recommendations and removed it against medical advice. He had been having some ostomy output so I gave him clears but had recommend NG to gravity pending this very issue.  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 Ostomy has been pink and healthy despite the worsening prolapse. RN last night also described as pink. He has continued to have some gas in the bag.   Objective: Vital signs in last 24 hours: Temp:  [98.1 F (36.7 C)-100 F (37.8 C)] 99.6 F (37.6 C) (09/24 0741) Pulse Rate:  [65-117] 114 (09/24 0800) Resp:  [14-27] 24 (09/24 0800) BP: (129-187)/(62-120) 166/86 (09/24 0800) SpO2:  [87 %-100 %] 94 % (09/24 0800) Last BM Date : 07/02/24  Intake/Output from previous day: 09/23 0701 - 09/24 0700 In: 304.9 [I.V.:304.9] Out: 670 [Urine:550; Emesis/NG output:100; Stool:20] Intake/Output this shift: No intake/output data recorded.  General appearance: alert and keeps eyes closed GI: soft, distended, ostomy pink with prolapse, minimal tenderness on palpation evoked midline c/d/I with honeycomb   Lab Results:  Recent Labs    06/30/24 1411 07/01/24 0744  WBC 7.0 6.4  HGB 9.4* 9.9*  HCT 30.1* 31.9*  PLT 135* 116*   BMET Recent Labs    06/30/24 1411 07/01/24 0744  NA 140 138  K 3.6 3.3*  CL 107 104  CO2 24 23  GLUCOSE 150* 100*  BUN 14 11  CREATININE 0.99 0.76  CALCIUM 8.1* 8.0*   PT/INR No results for input(s): LABPROT, INR in the last 72 hours.  Studies/Results: DG Abd 1 View Result Date: 07/03/2024 CLINICAL DATA:  Postoperative ileus. EXAM: ABDOMEN - 1 VIEW COMPARISON:  KUB 06/30/2024, CT 06/28/2024 FINDINGS: Air and stool present within the colon. There are several central air-filled dilated  small bowel loops measuring up to 5.3 cm in diameter new compared to the prior exam. Mild fecal retention over the hepatic flexure and rectum. Ostomy site over the left lower abdomen. Vertical skin staples over the midline abdomen. Remainder of the exam is unchanged. IMPRESSION: Several central air-filled dilated small bowel loops measuring up to 5.3 cm in diameter which are new compared to the prior exam. Findings may be due to postoperative ileus versus small bowel obstruction. Electronically Signed   By: Toribio Agreste M.D.   On: 07/03/2024 08:50    Anti-infectives: Anti-infectives (From admission, onward)    Start     Dose/Rate Route Frequency Ordered Stop   06/28/24 1929  ceFAZolin  (ANCEF ) 2-4 GM/100ML-% IVPB       Note to Pharmacy: Joshua Planas S: cabinet override      06/28/24 1929 06/28/24 2258       Assessment/Plan: Patient s/p Ex lap, LOA for SBO in setting of prior colostomy that has prolapse at baseline but is worsened with this distention and ileus. Patient refusing treatment, refusing to communication, refusing NG tube yesterday and replace.  NPO Difficult situation due to interpretor limitations Recommend Npo and NG Palliative seeing as patient has expressed that he does not want to have any more interventions    LOS: 5 days    Martin [name] Pander 07/03/2024

## 2024-07-03 NOTE — Progress Notes (Signed)
 Tried communicating with patient via whiteboard. He pushed it away and would not make an attempt to read it or write anything. Yesterday he was reading it every time and writing to us . X-ray completed. Pt refused any other interventions I attempted. MD aware.

## 2024-07-03 NOTE — Progress Notes (Signed)
   07/03/24 1323  Spiritual Encounters  Type of Visit Initial  Care provided to: Patient  Conversation partners present during encounter Nurse;Other (comment) (Sign language interpreter)  Referral source Patient request  Reason for visit Routine spiritual support  OnCall Visit No  Spiritual Framework  Patient Stress Factors Lack of knowledge;Loss of control;Major life changes;Other (Comment) (Pt does not understand the severity of his condition)  Family Stress Factors Not reviewed  Interventions  Spiritual Care Interventions Made Compassionate presence;Prayer  Intervention Outcomes  Outcomes Connection to spiritual care;Awareness of support;Reduced isolation   Chaplain responding to referral from Palliative provider stating Pt wanted to see a chaplain.  Upon arrival in the room I found the Pt in bed. Through the sign language interpreter I was able to communicate to him that I was a chaplain and that I was told he wanted prayer.  I offered prayer while holding the Pt's hand and the interpreter translated to him.  Pt was thankful for the prayer and asked me to come back again later.  Chaplain will return. Also left contact information with the RN in case I was needed.  Maude Roll, MDiv  Chaplain, [facility] [email or web address] [phone]

## 2024-07-03 NOTE — Progress Notes (Signed)
 Daily Progress Note   Patient Name: Martin [name]       Date: 07/03/2024 DOB: 1956/04/02  Age: 68 y.o. MRN#: [id number] Attending Physician: Vicci Afton CROME, MD Primary Care Physician: [facility] Admit Date: 06/28/2024  Reason for Consultation/Follow-up: Establishing goals of care  Subjective:  68 y.o. male  with past medical history of Ogilvie syndrome, legal blindness, deafness, hypothyroidism, gout, chronic constipation admitted on 06/28/2024 with nausea/vomiting/abdominal pain/decreased p.o. intake.    Workup revealed small bowel obstruction with possible internal hernia/Ogilvie syndrome/history of abdominal surgery including colectomy.  Patient underwent exploratory laparotomy which revealed small bowel obstruction with volvulus secondary to adhesions.  Lysis of adhesions was performed.  Patient remained hemodynamically stable throughout the case.  He was kept intubated postprocedure for airway protection.  Had issues with agitation maxed out on Precedex  and fentanyl  was still having episodes of agitation and pulling on lines.  Was sedated with Versed .  He was extubated on 06/30/2024 then required immediate reintubation.  He was then reextubated on 07/01/2024.  07/01/2024: initial completed, was not able to speak with patient significantly due to his refusal and decline to speak with this examiner. I did however obtain some helpful background regarding patient's status.   Today, minimal labs available as patient has been refusing.  Most recent labs were from 07/01/2024 and have already been reviewed. Vital signs reviewed and have been variable.  Noted to have some intermittent tachycardia, tachypnea, and decreased O2 sats.  Blood pressure levels variable with occasional elevated readings.  Requiring supplemental oxygen when tolerated by  nasal cannula/high flow nasal cannula versus trach mask (as he will refuse to leave nasal cannula in place).  Requiring anywhere from 3 to 10 L depending on method of administration of oxygen.  Medication administration record reviewed.  He was on Precedex  but this appears to have been stopped as of 1900 on 9/23.  Has Phenergan  12.5 mg to begin every 6 hours as needed for refractory nausea and vomiting it is difficult to decipher as it appears it was paused and restarted many times but perhaps 1 dose was given.  Received 1 dose of alprazolam  0.5 mg on 07/02/2024 and 4 mg of IV Zofran  on 07/02/2024 otherwise, no other symptom meds given.  Independent history obtained from nursing staff and interpreters as patient has varying levels of consciousness.  Per nursing staff, patient appears to be rapidly declining.  Yesterday, he was refusing a lot of care including labs, vital sign checks, O2, and demanded that his NG tube be removed.  Today, they report his abdomen appeared more distended and he was more lethargic.  Also noted that his colostomy site appeared to be protruding significantly more.  He was noted to be unable to communicate as much.  Previously they were using a white board but now patient was either refusing or unable to communicate in this manner.  They are concerned about further decline.  The patient's advocates/legal guardian consented to a voluntary advance Care Planning Conversation in person. Individuals present for the conversation: Martin [name] (long time interpreter), Martin [name] (long time advocate/interpreter), and Martin [name] (DSS guardian). See below.  I had a long discussion with patient's interpreters/advocates who are very familiar with patient and have known him for many years.  They report that on numerous conversations, he has expressed his strong desire to move out of the [facility] and back home with his wife.  He does not like being in the [facility].  Historically, he will  often refuse vitals, care, occasionally meds, and labs.  They were unable to obtain labs in over a year at the [facility].  They report that the only time he is agreeable to interventions is when he is very ill or symptomatic.  He will then allow numerous interventions for example during this hospitalization allowed NG tube, labs, surgery, vitals, etc. while he was critically ill.  However, once he was feeling better he has started to refuse labs and interventions.  This will then start cycle again where he becomes progressively more ill and symptomatic and then he will allow care again.  This has been an ongoing cycle here at the hospital and prior to admission at the SNF.  Per interpreters, the main thing he is fixated on getting out of the facility and being able to go home where he feels his wife will be able to care for him with some assistance from home health.  We discussed that given his level of debility at baseline as well as these new declines in health, it is highly unlikely he would ever be able to return back to the community.  He also tends to prefer to be left alone and has low threshold for tolerating medical intervention such as vital/labs etc.  Discussed that if goals of care are to optimize his current quality of life knowing the limitations as it relates to return to the community and preference for being left alone and avoiding invasive interventions, then would recommend DNR/DNI/comfort care and transition to inpatient hospice.  Long discussion with interpreters regarding the best methods of ensuring patient with his level of deficits (legally blind, deaf, mute, questionable/variable levels of cognition) is able to understand, communicate, and make decisions.  He typically requires education be provided and very discrete, concrete, and black-and-white terms.  Too much information will overwhelm him and he will shut down.  He is also triggered by any mention of social work or case management  as he does not agree with his requirement of an assigned legal guardian.  Attempted follow-up with patient to discuss goals of care.  However, he is very difficult to arouse and only answers yes and no questions with much physical stimuli and encouragement.  Patient appears to be rapidly declining.  Ultimately, with much encouragement we are able to obtain consent to reinsert NG tube but otherwise conversation was limited.   Asked for in person assistance from Dr. Vicci (attending) who graciously agreed to come to bedside and assist.  Background information provided.  Concern at this point is that when patient is alert enough to voice his needs and  desires, he is refusing much care and has told nursing staff that he would not want reintubation.  Only when he becomes critically ill are we able to intervene.  Discussed concern that we are actively going against patient's goals and wishes given how adamant he is when feeling well enough to express his desire to be left alone and his refusals of care.  We discussed with attending, bedside nursing staff, interpreters, and legal guardian how critically ill he is at this point and his high potential for further decline, new functional deficits, and recurrent bowel obstructions (especially in the setting of medication and care refusals/diagnosis of Ogilvie syndrome/requiring numerous abdominal surgeries with potential for further scar tissue development).  Discussed that at this point given his previously expressed wishes (to avoid aggressive/invasive procedures and mostly be left alone, quality of life = being able to live in the community), severity of current illness, and poor prognosis, both myself and Dr. Vicci recommend DNR/DNI and further discussion and evaluation of possible transition to comfort care/hospice depending on patient's response to treatment.  Patient's legal guardian provided required DSS paperwork for two provider sign off to transition to  DNR/DNI.  This nurse practitioner completed appropriate paperwork with Dr. Vicci to update CODE STATUS.  Paperwork was notarized.  Legal guardian assures this provider that she will go ahead and submit paperwork to her supervisor today and expedite process as she is aware of the urgency of this concern.  Patient will remain a full code until paperwork has been approved by supervisor.  Plan is to reconvene on 07/04/2024 to further evaluate response to therapy and reattempt goals of care discussion with patient.  Outcome of the conversations and/or documents completed: DSS paperwork for 2 provider sign off to transition to DNR/DNI   I spent 45 minutes providing separately identifiable ACP services with the patient and/or surrogate decision maker in a voluntary, in-person conversation discussing the patient's wishes and goals as detailed in the above note.  Chart review/care coordination:  Completed extensive chart review including EPIC notes, vitals, MAR. Coordinated care with attending physician, attending surgeon, bedside nursing staff, and TOC.  Also closely collaborated with patient's legal guardian Pharmacist, community) and 2 interpreters who are very familiar with the patient (Amy Levora Martin [name])   Length of Stay: 5   Physical Exam Constitutional:      Appearance: He is ill-appearing.  Pulmonary:     Effort: Tachypnea present.  Abdominal:     General: There is distension.     Comments: Large protrusion of intestinal material from colostomy site at left abdomen, tissue appears pink and moist  Skin:    General: Skin is cool.  Neurological:     Mental Status: He is lethargic.             Vital Signs: BP 127/81   Pulse (!) 114   Temp 99.6 F (37.6 C) (Axillary)   Resp (!) 25   Ht 6' 1 (1.854 m)   Wt 55 kg   SpO2 94%   BMI 16.00 kg/m  SpO2: SpO2: 94 % O2 Device: O2 Device: Tracheostomy Collar O2 Flow Rate: O2 Flow Rate (L/min): 8 L/min      Palliative Assessment/Data:  Current: 20%   Palliative Care Assessment & Plan   Patient Profile/Assessment:  68 year old male admitted for small bowel obstruction in the setting of Ogilvie syndrome status post exploratory laparotomy.  Communication complicated by his baseline deaf/mute/legally blind status.  Requires interpreters.  Patient critically ill admission and  has remained so during the entirety of his admission.  Continues to require ICU level of care.  Patient has varying levels of cognition and when more alert will refuse care including labs and NG tube.  He will then become progressively more ill and require reinitiation of invasive medical care.  Multiple attempts at goals of care have been made.  Unfortunately, due to refusal or cognitive status no significant lengthy discussions have been able to be completed with patient.  However, extensive discussions with his interpreters and advocates who are very familiar with him.  Unfortunately, prognosis is very grim.  After discussion with attending, interpreter/advocates, and legal guardian decision was made to transition to DNR/DNI via two provider sign off.  Paperwork pending therefore he remains a full code in the interim.  Will continue to attempt goals of care discussion and evaluate status.  Plan will either be to discharge back to skilled nursing facility with therapy (although typically noncompliant with therapy with past attempt at the facility) or transition to comfort care/inpatient hospice if status does not significantly improve.   Recommendations/Plan: Full code/full scope, while paperwork pending for DNR/DNI will update CODE STATUS once approval has been received from patient's legal guardian/legal guardian supervisor Continue NG tube and aggressive care No uncontrolled symptoms noted at present, continue current symptom regimen Palliative medicine team will continue to follow for ongoing goals of care discussion, symptom management, and coordination of  care.   Symptom management:  Symptoms stable at present, therefore continue symptom regimen per admitting team with PMT available as needed for support  Prognosis:   Poor/guarded     Discharge Planning: To Be Determined    Detailed review of medical records (labs, imaging, vital signs), medically appropriate exam, discussed with treatment team, counseling and education to patient, family, & staff, documenting clinical information, medication management, coordination of care    Billing based on MDM: High  Problems Addressed: One acute or chronic illness or injury that poses a threat to life or bodily function  Amount and/or Complexity of Data: Category 1:Assessment requiring an independent historian(s) and Category 3:Discussion of management or test interpretation with external physician/other qualified health care professional/appropriate source (not separately reported)  Risks: n/a         Laymon CHRISTELLA Pinal, NP  Palliative Medicine Team Team phone # [phone]  Thank you for allowing the Palliative Medicine Team to assist in the care of this patient. Please utilize secure chat with additional questions, if there is no response within 30 minutes please call the above phone number.  Palliative Medicine Team providers are available by phone from 7am to 7pm daily and can be reached through the team cell phone.  Should this patient require assistance outside of these hours, please call the patient's attending physician.

## 2024-07-03 NOTE — Plan of Care (Signed)
  Problem: Education: Goal: Knowledge of General Education information will improve Description: Including pain rating scale, medication(s)/side effects and non-pharmacologic comfort measures Outcome: Not Progressing   Problem: Clinical Measurements: Goal: Ability to maintain clinical measurements within normal limits will improve Outcome: Not Progressing Goal: Will remain free from infection Outcome: Not Progressing Goal: Diagnostic test results will improve Outcome: Not Progressing Goal: Respiratory complications will improve Outcome: Not Progressing Goal: Cardiovascular complication will be avoided Outcome: Not Progressing   Problem: Activity: Goal: Risk for activity intolerance will decrease Outcome: Not Progressing   Problem: Coping: Goal: Level of anxiety will decrease Outcome: Not Progressing

## 2024-07-03 NOTE — Progress Notes (Signed)
 PROGRESS NOTE   Martin [name]  FMW:[id number] DOB: September 24, 1956 DOA: 06/28/2024 PCP: [facility]   Chief Complaint  Patient presents with   Emesis   Level of care: ICU  Brief Admission History:  Martin [name] is a 68 y.o. 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  Patient history obtained through interpreter.  He has been having increased vomiting over the last 24 hours.  He does complain of abdominal pain but is unable to specify exact location of it.  He has not been eating and has had decreased output through his ostomy bag.  Due to all of this, he was brought to the hospital for evaluation.  He does have a history of refusing treatments.    Assessment and Plan:  Small bowel obstruction with possible internal hernia/ Ogilvie syndrome/ History of abdominal surgery including colectomy   POD # 5 -exploratory laparotomy with no resection  - Failed extubation attempt 06/30/2024 hide to be reintubated. - 07/01/24 -extubated successfully-remain on 2 L, stable  - Hemodynamically stable, Pt insisted on NG removal 9/23 and it was too soon - unfortunately ileus persists and NG needs to be replaced and he should be kept NPO for now - Surgery following, agree with recommendation for replacing NG and make NPO  -NG to LIS, -continue for bowel decompression - Diet : NPO - Gentle IVF - Continue monitor I's and O's - foley catheter - given patient consistently refusing treatments we have asked for palliative care consult for goals of care    Intake/Output Summary (Last 24 hours) at 07/03/2024 1201 Last data filed at 07/02/2024 2000 Gross per 24 hour  Intake --  Output 570 ml  Net -570 ml   - Continue with ABG as needed, DuoNeb bronchodilators, routine suction, routine oral care - NG needs to be replaced today if patient will allow, palliative goals of care discussions today with  qualified interpreter   -off Precedex , off Levophed  Will monitor closely    Surgical abdominal wound-clean, colostomy present - appears healthy, pink, moist   Chronic kidney disease Stable at base line  Recent Labs       Lab Results  Component Value Date    CREATININE 0.76 07/01/2024    CREATININE 0.99 06/30/2024    CREATININE 0.91 06/29/2024      Hypothyroidism Resuming levothyroxine     Elevated blood sugar Check hemoglobin A1c--5.1%  CBG (last 3)  Recent Labs    07/03/24 0400 07/03/24 0738 07/03/24 1131  GLUCAP 118* 116* 90     Visual and hearing impairments - Apparently patient committed through some kind interpreter - Partial visual acuity 1 eye still present    DVT prophylaxis: enoxaparin   Code Status: Full  Family Communication:  Disposition: return to SNF    Consultants:  Surgery Procedures:   Antimicrobials:    Subjective: Pt continues refusing treatments   Objective: Vitals:   07/03/24 0741 07/03/24 0800 07/03/24 0900 07/03/24 1000  BP:  (!) 166/86 (!) 143/87 127/81  Pulse:  (!) 114    Resp:  (!) 24 (!) 22 (!) 25  Temp: 99.6 F (37.6 C)     TempSrc: Axillary     SpO2:  94%    Weight:      Height:        Intake/Output Summary (Last 24 hours) at 07/03/2024 1158 Last data filed at 07/02/2024 2000 Gross per 24 hour  Intake --  Output  600 ml  Net -600 ml   Filed Weights   06/30/24 0426 07/01/24 0435 07/02/24 0405  Weight: 53.6 kg 55.9 kg 55 kg   Examination:  General exam: Appears chronically ill; cachectic, calm  Respiratory system: Clear to auscultation. Respiratory effort normal. Cardiovascular system: normal S1 & S2 heard. No JVD, murmurs, rubs, gallops or clicks. No pedal edema. Gastrointestinal system: Abdomen is mildly distended, soft and ostomy appears pink, moist. hypoactive bowel sounds heard. Central nervous system: somnolent but arousable. No focal neurological deficits. Extremities: Symmetric 5 x 5 power. Skin: No  rashes, lesions or ulcers. Psychiatry: Judgement and insight appear normal. Mood & affect appropriate.   Data Reviewed: I have personally reviewed following labs and imaging studies  CBC: Recent Labs  Lab 06/28/24 1213 06/29/24 0235 06/30/24 1411 07/01/24 0744  WBC 7.6 9.9 7.0 6.4  NEUTROABS 6.4  --  5.2  --   HGB 13.8 12.3* 9.4* 9.9*  HCT 42.8 37.7* 30.1* 31.9*  MCV 89.0 87.3 92.0 90.1  PLT 232 189 135* 116*    Basic Metabolic Panel: Recent Labs  Lab 06/28/24 1213 06/29/24 0130 06/30/24 1411 07/01/24 0744  NA 139 143 140 138  K 4.2 4.2 3.6 3.3*  CL 99 107 107 104  CO2 26 24 24 23   GLUCOSE 189* 131* 150* 100*  BUN 22 21 14 11   CREATININE 0.85 0.91 0.99 0.76  CALCIUM 10.1 8.6* 8.1* 8.0*  MG  --  1.7  --   --   PHOS  --  2.6  --   --     CBG: Recent Labs  Lab 07/02/24 2117 07/03/24 0102 07/03/24 0400 07/03/24 0738 07/03/24 1131  GLUCAP 101* 108* 118* 116* 90    Recent Results (from the past 240 hours)  MRSA Next Gen by PCR, Nasal     Status: None   Collection Time: 06/28/24  9:37 PM   Specimen: Nasal Mucosa; Nasal Swab  Result Value Ref Range Status   MRSA by PCR Next Gen NOT DETECTED NOT DETECTED Final    Comment: (NOTE) The GeneXpert MRSA Assay (FDA approved for NASAL specimens only), is one component of a comprehensive MRSA colonization surveillance program. It is not intended to diagnose MRSA infection nor to guide or monitor treatment for MRSA infections. Test performance is not FDA approved in patients less than 4 years old. Performed at [facility], [address], [place], [place] [address]      Radiology Studies: DG Abd 1 View Result Date: 07/03/2024 CLINICAL DATA:  Postoperative ileus. EXAM: ABDOMEN - 1 VIEW COMPARISON:  KUB 06/30/2024, CT 06/28/2024 FINDINGS: Air and stool present within the colon. There are several central air-filled dilated small bowel loops measuring up to 5.3 cm in diameter new compared to the prior exam. Mild fecal  retention over the hepatic flexure and rectum. Ostomy site over the left lower abdomen. Vertical skin staples over the midline abdomen. Remainder of the exam is unchanged. IMPRESSION: Several central air-filled dilated small bowel loops measuring up to 5.3 cm in diameter which are new compared to the prior exam. Findings may be due to postoperative ileus versus small bowel obstruction. Electronically Signed   By: Toribio Agreste M.D.   On: 07/03/2024 08:50   Scheduled Meds:  bisacodyl   10 mg Rectal Daily   brimonidine   1 drop Left Eye TID   Chlorhexidine  Gluconate Cloth  6 each Topical Daily   docusate  100 mg Per Tube BID   dorzolamide -timolol   1 drop Both  Eyes BID   enoxaparin  (LOVENOX ) injection  40 mg Subcutaneous Q24H   mouth rinse  15 mL Mouth Rinse 4 times per day   pantoprazole  (PROTONIX ) IV  40 mg Intravenous Q12H   polyethylene glycol  17 g Per Tube Daily   senna-docusate  1 tablet Per NG tube BID   Continuous Infusions:  dexmedetomidine  (PRECEDEX ) IV infusion Stopped (07/02/24 1005)   fentaNYL  infusion INTRAVENOUS Stopped (07/01/24 1021)   norepinephrine  (LEVOPHED ) Adult infusion Stopped (07/01/24 1655)   promethazine  (PHENERGAN ) injection (IM or IVPB) 12.5 mg (07/03/24 0021)     LOS: 5 days   Critical Care Procedure Note Authorized and Performed by: KYM Louder MD  Total Critical Care time:  56 mins Due to a high probability of clinically significant, life threatening deterioration, the patient required my highest level of preparedness to intervene emergently and I personally spent this critical care time directly and personally managing the patient.  This critical care time included obtaining a history; examining the patient, pulse oximetry; ordering and review of studies; arranging urgent treatment with development of a management plan; evaluation of patient's response of treatment; frequent reassessment; and discussions with other providers.  This critical care time was  performed to assess and manage the high probability of imminent and life threatening deterioration that could result in multi-organ failure.  It was exclusive of separately billable procedures and treating other patients and teaching time.   Afton Louder, MD How to contact the [facility] Attending or Consulting provider 7A - 7P or covering provider during after hours 7P -7A, for this patient?  Check the care team in [facility] and look for a) attending/consulting [facility] provider listed and b) the [facility] team listed Log into [email or web address] to find provider on call.  Locate the [facility] provider you are looking for under [facility] and page to a number that you can be directly reached. If you still have difficulty reaching the provider, please page the [facility] (Director on Call) for the Hospitalists listed on amion for assistance.  07/03/2024, 11:58 AM

## 2024-07-04 DIAGNOSIS — Z515 Encounter for palliative care: Secondary | ICD-10-CM | POA: Diagnosis not present

## 2024-07-04 DIAGNOSIS — H548 Legal blindness, as defined in USA: Secondary | ICD-10-CM | POA: Diagnosis not present

## 2024-07-04 DIAGNOSIS — Z558 Other problems related to education and literacy: Secondary | ICD-10-CM

## 2024-07-04 DIAGNOSIS — Z79899 Other long term (current) drug therapy: Secondary | ICD-10-CM

## 2024-07-04 DIAGNOSIS — E039 Hypothyroidism, unspecified: Secondary | ICD-10-CM | POA: Diagnosis not present

## 2024-07-04 DIAGNOSIS — K5981 Ogilvie syndrome: Secondary | ICD-10-CM | POA: Diagnosis not present

## 2024-07-04 DIAGNOSIS — K56609 Unspecified intestinal obstruction, unspecified as to partial versus complete obstruction: Secondary | ICD-10-CM | POA: Diagnosis not present

## 2024-07-04 DIAGNOSIS — Z7189 Other specified counseling: Secondary | ICD-10-CM | POA: Diagnosis not present

## 2024-07-04 LAB — GLUCOSE, CAPILLARY
Glucose-Capillary: 91 mg/dL (ref 70–99)
Glucose-Capillary: 107 mg/dL — ABNORMAL HIGH (ref 70–99)

## 2024-07-04 MED ORDER — LORAZEPAM 2 MG/ML IJ SOLN: 1.0000 mg | INTRAMUSCULAR | Status: DC | PRN

## 2024-07-04 MED ORDER — POLYVINYL ALCOHOL 1.4 % OP SOLN: 1.0000 [drp] | Freq: Four times a day (QID) | OPHTHALMIC | Status: DC | PRN

## 2024-07-04 MED ORDER — GLYCOPYRROLATE 0.2 MG/ML IJ SOLN: 0.2000 mg | INTRAMUSCULAR | Status: DC | PRN

## 2024-07-04 MED ORDER — HYDROMORPHONE HCL 1 MG/ML IJ SOLN: 1.0000 mg | INTRAMUSCULAR | Status: DC | PRN

## 2024-07-04 MED ORDER — POLYETHYLENE GLYCOL 3350 17 G PO PACK: 17.0000 g | PACK | Freq: Every day | ORAL | Status: DC | PRN

## 2024-07-04 MED ORDER — ACETAMINOPHEN 160 MG/5ML PO SOLN: 650.0000 mg | Freq: Four times a day (QID) | ORAL | Status: DC | PRN

## 2024-07-04 MED ORDER — IPRATROPIUM-ALBUTEROL 0.5-2.5 (3) MG/3ML IN SOLN: 3.0000 mL | Freq: Four times a day (QID) | RESPIRATORY_TRACT | Status: DC | PRN

## 2024-07-04 MED ORDER — SENNOSIDES-DOCUSATE SODIUM 8.6-50 MG PO TABS: 1.0000 | ORAL_TABLET | Freq: Two times a day (BID) | ORAL | Status: DC | PRN

## 2024-07-04 MED ORDER — GLYCOPYRROLATE 1 MG PO TABS: 1.0000 mg | ORAL_TABLET | ORAL | Status: DC | PRN

## 2024-07-04 MED ORDER — MAGNESIUM HYDROXIDE 400 MG/5ML PO SUSP: 15.0000 mL | Freq: Every day | ORAL | Status: DC | PRN

## 2024-07-04 MED ORDER — SODIUM CHLORIDE 0.9 % IV SOLN
INTRAVENOUS | Status: DC
Start: 2024-07-04 — End: 2024-07-04

## 2024-07-04 NOTE — Progress Notes (Signed)
 Zelda Salmon IC[facility]  Hospice hospital liaison note   Referral received from [facility] for patient interest in [facility].  [facility] is able to accept patient this afternoon. Consents are completed.    RN staff, you may call report at any time to [phone], room is assigned when report is called.  Please leave IV intact and send completed DNR with patient.   Updated attending and [facility] manager via [employer].  Thank you for the opportunity to participate in this patient's care  Amy Darien BSN, RN [facility] Liaison [phone]

## 2024-07-04 NOTE — Plan of Care (Signed)
  Problem: Education: Goal: Knowledge of General Education information will improve Description: Including pain rating scale, medication(s)/side effects and non-pharmacologic comfort measures Outcome: Progressing   Problem: Health Behavior/Discharge Planning: Goal: Ability to manage health-related needs will improve Outcome: Not Progressing   Problem: Clinical Measurements: Goal: Ability to maintain clinical measurements within normal limits will improve Outcome: Progressing Goal: Will remain free from infection Outcome: Progressing Goal: Diagnostic test results will improve Outcome: Progressing Goal: Respiratory complications will improve Outcome: Not Progressing Goal: Cardiovascular complication will be avoided Outcome: Progressing   Problem: Activity: Goal: Risk for activity intolerance will decrease Outcome: Not Progressing   Problem: Nutrition: Goal: Adequate nutrition will be maintained Outcome: Not Progressing   Problem: Coping: Goal: Level of anxiety will decrease Outcome: Progressing   Problem: Elimination: Goal: Will not experience complications related to bowel motility Outcome: Progressing Goal: Will not experience complications related to urinary retention Outcome: Progressing   Problem: Pain Managment: Goal: General experience of comfort will improve and/or be controlled Outcome: Progressing   Problem: Safety: Goal: Ability to remain free from injury will improve Outcome: Not Progressing   Problem: Skin Integrity: Goal: Risk for impaired skin integrity will decrease Outcome: Not Progressing

## 2024-07-04 NOTE — Progress Notes (Signed)
 Went in to do CPT, patient's bed is not working properly now and reading an error message.  Patient is coughing up secretions at this time and has had lasix  given.  Spoke with RN about getting another bed ordered for patient.

## 2024-07-04 NOTE — TOC Transition Note (Signed)
 Transition of Care [facility]) - Discharge Note   Patient Details  Name: Martin [name] MRN: [id number] Date of Birth: 01-06-1956  Transition of Care [facility]) CM/SW Contact:  Hoy DELENA Bigness, LCSW Phone Number: 07/04/2024, 5:18 PM   Clinical Narrative:    Pt to transfer to [facility] for residential hospice. Consents have been signed by legal guardian. EMS has been called.    Final next level of care: Hospice Medical Facility Barriers to Discharge: Barriers Resolved   Patient Goals and CMS Choice Patient states their goals for this hospitalization and ongoing recovery are:: Return to [facility] CMS Medicare.gov Compare Post Acute Care list provided to:: Legal Guardian Choice offered to / list presented to : [facility] POA / Guardian      Discharge Placement              Patient chooses bed at: Other - please specify in the comment section below: [facility])   Name of family member notified: Legal guardian    Discharge Plan and Services Additional resources added to the After Visit Summary for                                       Social Drivers of Health (SDOH) Interventions SDOH Screenings   Food Insecurity: Patient Unable To Answer (06/29/2024)  Housing: Unknown (06/29/2024)  Transportation Needs: Patient Unable To Answer (06/29/2024)  Utilities: Patient Unable To Answer (06/29/2024)  Social Connections: Unknown (06/29/2024)  Tobacco Use: Low Risk  (07/01/2024)     Readmission Risk Interventions    07/01/2024   10:55 AM 06/30/2024    1:00 PM  Readmission Risk Prevention Plan  Transportation Screening Complete Complete  PCP or Specialist Appt within 5-7 Days  Complete  Home Care Screening  Complete  Medication Review (RN CM)  Complete  HRI or Home Care Consult Complete   Social Work Consult for Recovery Care Planning/Counseling Complete   Palliative Care Screening Complete   Medication Review Oceanographer) Complete

## 2024-07-04 NOTE — Progress Notes (Signed)
 PROGRESS NOTE   Martin [name]  FMW:[id number] DOB: 04/22/1956 DOA: 06/28/2024 PCP: [facility]   Chief Complaint  Patient presents with   Emesis   Level of care: ICU  Brief Admission History:  Martin [name] is a 68 y.o. 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  Patient history obtained through interpreter.  He has been having increased vomiting over the last 24 hours.  He does complain of abdominal pain but is unable to specify exact location of it.  He has not been eating and has had decreased output through his ostomy bag.  Due to all of this, he was brought to the hospital for evaluation.  He does have a history of refusing treatments.    Assessment and Plan:  Small bowel obstruction with possible internal hernia/ Ogilvie syndrome/ History of abdominal surgery including colectomy   POD # 6 -exploratory laparotomy with no resection  - Failed extubation attempt 06/30/2024 subsequently reintubated. - 07/01/24 -extubated successfully  - Hemodynamically stable, Pt insisted on NG removal 9/23 and it was too soon - unfortunately ileus persists and NG needs to be replaced and he should be kept NPO for now - Surgery following, agree with recommendation for replacing NG and make NPO - given patient consistently refusing treatments we have asked for palliative care consult for goals of care  -- after prolonged coordination and discussions with palliative care team, social workers, legal guardians and patient -- he ultimately has decided to go full comfort care and to transfer to [facility] for residential hospice care.     Intake/Output Summary (Last 24 hours) at 07/04/2024 1450 Last data filed at 07/04/2024 1313 Gross per 24 hour  Intake 367.92 ml  Output 2000 ml  Net -1632.08 ml   - Pt requested NG be removed which is done and full comfort care orders have been put in place,  DNR documentation completed and focus of care transitioning to comfort and dignity as of today    -off Precedex , off Levophed  Transitioned to full comfort measures on 07/04/24     Surgical abdominal wound-clean, colostomy present - appears healthy, pink, moist   Chronic kidney disease Stable at base line  Recent Labs       Lab Results  Component Value Date    CREATININE 0.76 07/01/2024    CREATININE 0.99 06/30/2024    CREATININE 0.91 06/29/2024      Hypothyroidism Had refused meds today, now on full comfort care     Elevated blood sugar Check hemoglobin A1c--5.1%  CBG (last 3)  Recent Labs    07/03/24 1943 07/04/24 0411 07/04/24 0722  GLUCAP 107* 91 107*  --stopping blood sugar testing as he transitions to full comfort care    Visual and hearing impairments - Apparently patient committed through some kind interpreter - Partial visual acuity 1 eye still present   DVT prophylaxis: enoxaparin   Code Status: Full  Family Communication:  Disposition: transfer to residential hospice [facility])     Consultants:  Surgery  Procedures:   Antimicrobials:    Subjective: Pt restarted to refuse treatments again this morning.   Objective: Vitals:   07/04/24 1000 07/04/24 1100 07/04/24 1200 07/04/24 1300  BP: (!) 141/71 120/75 132/74 (!) 145/71  Pulse: (!) 101 (!) 104 99 (!) 102  Resp: (!) 26 (!) 22 (!) 22 20  Temp:      TempSrc:      SpO2:  99% 92% 99% 94%  Weight:      Height:        Intake/Output Summary (Last 24 hours) at 07/04/2024 1450 Last data filed at 07/04/2024 1313 Gross per 24 hour  Intake 367.92 ml  Output 2000 ml  Net -1632.08 ml   Filed Weights   07/01/24 0435 07/02/24 0405 07/04/24 0600  Weight: 55.9 kg 55 kg 52.3 kg   Examination:  General exam: Appears chronically ill; cachectic, calm  Respiratory system: moderate increased work of breathing, rales heard and crackles.  Cardiovascular system: normal S1 & S2 heard. No JVD, murmurs, rubs,  gallops or clicks. No pedal edema. Gastrointestinal system: Abdomen is mildly distended, soft and ostomy appears pink, moist. hypoactive bowel sounds heard. Central nervous system: arousable. No focal neurological deficits. Extremities: Symmetric 5 x 5 power. Skin: No rashes, lesions or ulcers. Psychiatry: Judgement and insight appear normal. Mood & affect appropriate.   Data Reviewed: I have personally reviewed following labs and imaging studies  CBC: Recent Labs  Lab 06/28/24 1213 06/29/24 0235 06/30/24 1411 07/01/24 0744 07/03/24 1649  WBC 7.6 9.9 7.0 6.4 5.8  NEUTROABS 6.4  --  5.2  --   --   HGB 13.8 12.3* 9.4* 9.9* 10.9*  HCT 42.8 37.7* 30.1* 31.9* 35.4*  MCV 89.0 87.3 92.0 90.1 91.7  PLT 232 189 135* 116* 260    Basic Metabolic Panel: Recent Labs  Lab 06/28/24 1213 06/29/24 0130 06/30/24 1411 07/01/24 0744 07/03/24 1711  NA 139 143 140 138 140  K 4.2 4.2 3.6 3.3* 3.8  CL 99 107 107 104 104  CO2 26 24 24 23 24   GLUCOSE 189* 131* 150* 100* 126*  BUN 22 21 14 11 20   CREATININE 0.85 0.91 0.99 0.76 1.08  CALCIUM 10.1 8.6* 8.1* 8.0* 8.4*  MG  --  1.7  --   --   --   PHOS  --  2.6  --   --   --     CBG: Recent Labs  Lab 07/03/24 1131 07/03/24 1613 07/03/24 1943 07/04/24 0411 07/04/24 0722  GLUCAP 90 119* 107* 91 107*    Recent Results (from the past 240 hours)  MRSA Next Gen by PCR, Nasal     Status: None   Collection Time: 06/28/24  9:37 PM   Specimen: Nasal Mucosa; Nasal Swab  Result Value Ref Range Status   MRSA by PCR Next Gen NOT DETECTED NOT DETECTED Final    Comment: (NOTE) The GeneXpert MRSA Assay (FDA approved for NASAL specimens only), is one component of a comprehensive MRSA colonization surveillance program. It is not intended to diagnose MRSA infection nor to guide or monitor treatment for MRSA infections. Test performance is not FDA approved in patients less than 69 years old. Performed at [facility], [address] [place],  [place] [address]   Culture, blood (Routine X 2) w Reflex to ID Panel     Status: None (Preliminary result)   Collection Time: 07/03/24  4:49 PM   Specimen: BLOOD  Result Value Ref Range Status   Specimen Description BLOOD BLOOD LEFT ARM  Final   Special Requests   Final    BOTTLES DRAWN AEROBIC AND ANAEROBIC Blood Culture adequate volume   Culture   Final    NO GROWTH < 12 HOURS Performed at [facility], [address], [place], [place] [address]    Report Status PENDING  Incomplete  Culture, blood (Routine X 2) w Reflex to ID Panel  Status: None (Preliminary result)   Collection Time: 07/03/24  5:48 PM   Specimen: BLOOD  Result Value Ref Range Status   Specimen Description BLOOD BLOOD RIGHT ARM  Final   Special Requests   Final    AEROBIC BOTTLE ONLY Blood Culture results may not be optimal due to an inadequate volume of blood received in culture bottles   Culture   Final    NO GROWTH < 12 HOURS Performed at [facility], [address], [place], [place] [address]    Report Status PENDING  Incomplete     Radiology Studies: DG CHEST PORT 1 VIEW Result Date: 07/03/2024 CLINICAL DATA:  Hypoxia EXAM: PORTABLE CHEST 1 VIEW COMPARISON:  Chest x-ray 07/03/2024 FINDINGS: Enteric tube is partially visualized to the level of the diaphragm. The heart is enlarged, unchanged. There are patchy perihilar opacities bilaterally. There is no pleural effusion or pneumothorax. No acute fractures are seen. IMPRESSION: 1. Patchy perihilar opacities bilaterally, which may represent pulmonary edema or infection. 2. Cardiomegaly. Electronically Signed   By: Greig Pique M.D.   On: 07/03/2024 23:17   DG Abd 1 View Result Date: 07/03/2024 CLINICAL DATA:  Hypoxia.  NG tube verification. EXAM: ABDOMEN - 1 VIEW COMPARISON:  07/03/2024 FINDINGS: Limited field of view for tube placement verification purposes. An enteric tube is present with tip projecting over the left upper quadrant consistent with location in the  body of the stomach. Persisting gaseous distension of small and large bowel. Small amount of free air is suggested under the right hemidiaphragm likely postoperative with evidence of recent surgery including skin clips in place. Patchy infiltrates in the lungs. Cardiac enlargement. IMPRESSION: Enteric tube tip projects over the left upper quadrant consistent with location in the body of the stomach. Electronically Signed   By: Elsie Gravely M.D.   On: 07/03/2024 23:16   DG CHEST PORT 1 VIEW Result Date: 07/03/2024 CLINICAL DATA:  10031 Cough 10031 [id number] SOB (shortness of breath) [id number] EXAM: PORTABLE CHEST - 1 VIEW COMPARISON:  07/01/2024 FINDINGS: Esophagogastric tube terminates in the region of the stomach. Elevation of the left hemidiaphragm. Improved aeration of the lungs with decreasing perihilar airspace opacities, especially in the right lung base. No pleural effusion or pneumothorax. Mild cardiomegaly. Diffuse gaseous distension of the colon. IMPRESSION: 1. Decreasing perihilar airspace opacities, especially in the right lung base. 2. Similarly positioned esophagogastric tube. Electronically Signed   By: Rogelia Myers M.D.   On: 07/03/2024 16:22   DG Abd Portable 1V Result Date: 07/03/2024 EXAM: 1 VIEW XRAY OF THE ABDOMEN 07/03/2024 12:54:00 PM COMPARISON: 07/03/2024 CLINICAL HISTORY: S/p ng tube placement FINDINGS: LINES, TUBES AND DEVICES: Enteric tube in place with tip and side port projecting over the stomach. BOWEL: Gaseous distention of small bowel and colon, unchanged from prior exam. SOFT TISSUES: Midline surgical staples noted. Regular sign identified within the left upper quadrant of the abdomen which is favored to represent residual pneumoperitoneum status post laparotomy. No opaque urinary calculi. BONES: No acute osseous abnormality. IMPRESSION: 1. Residual pneumoperitoneum in the left upper quadrant, favored to be postoperative following recent laparotomy. 2. Gaseous distention of  small bowel and colon, unchanged from prior exam. 3. Enteric tube in place with tip and side port projecting over the stomach. The urgent finding will be called to the ordering provider by the Professional Radiology Assistants (PRAs) and documented in the [facility] dashboard. Electronically signed by: Waddell Calk MD 07/03/2024 01:39 PM EDT RP Workstation: [id number]   DG Abd 1 View  Result Date: 07/03/2024 CLINICAL DATA:  Postoperative ileus. EXAM: ABDOMEN - 1 VIEW COMPARISON:  KUB 06/30/2024, CT 06/28/2024 FINDINGS: Air and stool present within the colon. There are several central air-filled dilated small bowel loops measuring up to 5.3 cm in diameter new compared to the prior exam. Mild fecal retention over the hepatic flexure and rectum. Ostomy site over the left lower abdomen. Vertical skin staples over the midline abdomen. Remainder of the exam is unchanged. IMPRESSION: Several central air-filled dilated small bowel loops measuring up to 5.3 cm in diameter which are new compared to the prior exam. Findings may be due to postoperative ileus versus small bowel obstruction. Electronically Signed   By: Toribio Agreste M.D.   On: 07/03/2024 08:50   Scheduled Meds:  bisacodyl   10 mg Rectal Daily   brimonidine   1 drop Left Eye TID   docusate  100 mg Per Tube BID   dorzolamide -timolol   1 drop Both Eyes BID   ipratropium-albuterol   3 mL Nebulization Q6H   mouth rinse  15 mL Mouth Rinse 4 times per day   Continuous Infusions:  sodium chloride      promethazine  (PHENERGAN ) injection (IM or IVPB) Stopped (07/03/24 1648)    LOS: 6 days   Critical Care Procedure Note Authorized and Performed by: KYM Louder MD  Total Critical Care time:  50 mins Due to a high probability of clinically significant, life threatening deterioration, the patient required my highest level of preparedness to intervene emergently and I personally spent this critical care time directly and personally managing the patient.  This  critical care time included obtaining a history; examining the patient, pulse oximetry; ordering and review of studies; arranging urgent treatment with development of a management plan; evaluation of patient's response of treatment; frequent reassessment; and discussions with other providers.  This critical care time was performed to assess and manage the high probability of imminent and life threatening deterioration that could result in multi-organ failure.  It was exclusive of separately billable procedures and treating other patients and teaching time.   Afton Louder, MD How to contact the [facility] Attending or Consulting provider 7A - 7P or covering provider during after hours 7P -7A, for this patient?  Check the care team in [facility] and look for a) attending/consulting [facility] provider listed and b) the [facility] team listed Log into [email or web address] to find provider on call.  Locate the [facility] provider you are looking for under [facility] and page to a number that you can be directly reached. If you still have difficulty reaching the provider, please page the [facility] (Director on Call) for the Hospitalists listed on amion for assistance.  07/04/2024, 2:50 PM

## 2024-07-04 NOTE — Plan of Care (Signed)

## 2024-07-04 NOTE — Progress Notes (Signed)
 Daily Progress Note   Patient Name: Martin [name]       Date: 07/04/2024 DOB: Jun 28, 1956  Age: 68 y.o. MRN#: [id number] Attending Physician: Vicci Afton CROME, MD Primary Care Physician: [facility] Admit Date: 06/28/2024  Reason for Consultation/Follow-up: Establishing goals of care  Subjective:   68 y.o. male  with past medical history of Ogilvie syndrome, legal blindness, deafness, hypothyroidism, gout, chronic constipation admitted on 06/28/2024 with nausea/vomiting/abdominal pain/decreased p.o. intake.    Workup revealed small bowel obstruction with possible internal hernia/Ogilvie syndrome/history of abdominal surgery including colectomy.  Patient underwent exploratory laparotomy which revealed small bowel obstruction with volvulus secondary to adhesions.  Lysis of adhesions was performed.  Patient remained hemodynamically stable throughout the case.  He was kept intubated postprocedure for airway protection.  Had issues with agitation maxed out on Precedex  and fentanyl  was still having episodes of agitation and pulling on lines.  Was sedated with Versed .  He was extubated on 06/30/2024 then required immediate reintubation.  He was then reextubated on 07/01/2024.   07/01/2024: initial completed, was not able to speak with patient significantly due to his refusal and decline to speak with this examiner. I did however obtain some helpful background regarding patient's status.   07/03/2024: Patient rapidly deteriorated.  Attempted goals of care discussion but unable to complete significant discussion with patient.  Had numerous conversations with the medical team, DSS guardian, interpreter, and patient's advocate regarding his previously expressed goals and wishes.  Also discussed the severity of his illness.  Based off of patient's  previously expressed goals and wishes and extremely poor prognosis, two-provider sign off completed to transition to DNR/DNI in the setting of medical futility and as patient had previously expressed desire to avoid invasive interventions and preferred to be left alone.  Also had mentioned he did not want to be put back on ventilator and went awake and alert refuses invasive monitoring/lines/labs/and all meds other than oral meds that he approves.  DSS guardian provided with completed and notarized paperwork which she will escalate to her supervisor for completion.  Patient to remain full code in the interim.  Overnight, patient had an acute event in the form of worsening hypoxia.  Required nonrebreather to maintain saturations greater than 90%.  O2 sats improved with nonrebreather.  New orders placed including additional imaging and interventions.  He did require mitts/wrist restraints.  He was already on IV Unasyn  for pneumonitis/aspiration pneumonia.  DuoNebs, RT to suction, chest PT ordered.  They noted that if unable to stabilize with the previous interventions then he may require reintubation.  Today, limited labs available for  review as patient has been refusing.  Most recent labs were from 07/03/2024.  He did have repeat chest and abdominal x-rays on 07/03/2024 post NG tube placement and for presumed aspiration.  X-ray images independently reviewed.  Significant dilation of small and large bowel noted.  Enteric tube appeared to be in proper location.  Chest x-ray independently reviewed.  Heart appears enlarged.  Noted to have opacities present to perihilar space bilaterally.  This is concerning for aspiration.Vital signs reviewed.  O2 sat is persistently low.  Patient refusing to keep oxygen in place.  Intermittently allows nonrebreather mask.  Otherwise O2 provided via blow-by method with nonrebreather or trach collar at 8 to 15 L.  Blood pressure levels appear relatively stable.  Has some mild  tachycardia as well as some tachypnea.  Medication administration record reviewed.  I see some refusals of scheduled medicines.  Had 1 dose of IV Zofran  on 24-hour look back as well as 1 dose of Phenergan  12.5 mg.  Otherwise, no symptom meds administered.  Independent history also obtained from nursing staff.  They report that patient continues to be critically ill.  They note that he is refusing medication and care and required restraints overnight.  They remain very concerned about him.  He apparently told some previous nursing staff also that he would not want reintubation.  Met with patient and patient's advocate Delois Balloon) and interpreter Darra Pines). Ms. Pines provides interpreter services for this discussion. Both individuals are well known to patient. He has an intense distrust of the healthcare system but fortunately is very familiar and trusts these two individuals. Patient is more awake today. He does report some moderate to severe pain to his abdomen but refuses to take any medication other than oral medication (and has NG tube in place and current NPO orders). He also reports some occasional nausea not so bad now. Has persistent bothersome and productive cough with lots of sputum. We educated him on the concern for interval development of aspiration PNA which is why he has these symptoms.   The patient consented to a voluntary Advance Care Planning Conversation in person. Individuals present for the conversation: Howard Balloon, Greig Pines, this NP, and patient  He is able to participate in goals of care discussion. Engaged in a detailed conversation with the patient about the seriousness of his current illness, in light of his complex medical history, and explained the potential implications this may have for his recovery and long-term well-being. We discussed that at this point his prognosis is very poor.  We discussed our concern that his health will continue to deteriorate and that we need  to determine what he would want if and when this happened so that we can honor his wishes.  Goals of care elicited.  Patient prefers to be left alone and wants to be able to make his own decisions.  He wants to be able to eat and take meds by mouth. Have NG tube removed and not undergo any additional labs or receive meds via IV or NG tube (explicitly refuses with us  present in the room when nursing staff attempts to administer these interventions/meds). We discussed the various treatment paths that we could take at this point.  This included detailed discussion regarding continuing aggressive measures and what this would look like i.e. continue NG tube, daily labs, periodic CBGs, vitals, IV meds, meds via NG tube, and potentially escalating care to include noninvasive ventilator support/vent support versus transition to comfort focused care. Extensively  reviewed the components of comfort care, including cessation of non-essential interventions (e.g., labs, CBGs, telemetry, frequent vitals), discontinuation of non-comfort medications, and initiation of medications to manage symptoms and promote comfort.  We discussed that what ever path he desires or chooses we will honor but that we need to know what he wants while he is able to communicate this in order to honor his wishes (reiterated the importance of discussing/making decisions as soon as possible as he has experienced varying levels of consciousness during this admission as recently as yesterday). We discussed that to this point we have been pursuing aggressive measures however, these aggressive measures have been limited by not only the severity of his illness but in large part due to his refusals.  We discussed that if he wishes to pursue continued aggressive and life-saving measures then it would require his cooperation with the medical interventions to prevent the cycles of him becoming so gravely ill that he gets to the point where he can no longer refuse  care before we are allowed to intervene.  We discussed with comfort care if this is the route he takes we will also honor that decision.  We discussed that if he does choose the comfort care route we will do what ever we can to ensure his symptoms are well-managed but that he will continue to deteriorate and will die.  Discussed that without aggressive measures his prognosis is less than 2 weeks.  We discussed the various locations where comfort care and end-of-life care can take place including inpatient hospice versus hospital (he dislikes SNF and does not wish to return there and too ill/debilitated/without adequate social support to live in the community).  We provided extensive details and education about the inpatient hospice environment as he expresses an interest in this.  We showed him photos and videos of the inpatient hospice in [place] (he would prefer to be in [place] versus [place]).  After much discussion and deliberation, patient indicates his desire to be left alone and avoid any more invasive or aggressive measures.  He desires to be transition to inpatient hospice for end-of-life care.  He acknowledges that he understands that discontinuing aggressive measures means his life expectancy will be significantly shorter (reiterated less than 2 weeks).  He verbalized understanding of this and indicates he wishes to go to a place where he can have his own room and make his own decisions for himself and be comfortable and in peace for the time he has left.  Questions and concerns were addressed.  The patient was encouraged to call with questions or concerns. PMT will continue to support holistically.   Outcome of the conversations and/or documents completed: DSS paperwork certifying two physician recommendation for transition to DNR/DNI, DSS authorization to transition to comfort care and proceed with plan for inpatient hospice, DNR goldenrod form completed and scanned into EMR.  I spent 90  minutes providing separately identifiable ACP services with the patient and/or surrogate decision maker in a voluntary, in-person conversation discussing the patient's wishes and goals as detailed in the above note.  Chart review/care coordination:  Completed extensive chart review including EPIC notes, imaging (independently reviewed), MAR, vital signs. Worked extensively with attending physician, TOC, interpreters, bedside nursing staff, hospice liaison, and DSS guardianship staff (assigned legal guardian, DSS supervisor) to obtain appropriate paperwork in a timely manner and to allow patient to be transitioned from full code to DNR/DNI and to inpatient hospice.   Length of Stay: 6   Physical Exam  Constitutional:      Appearance: He is ill-appearing.     Comments: Frail  Pulmonary:     Effort: Tachypnea present.  Abdominal:     Comments: Abdomen examined and noted protrusion of intestinal tissue from colostomy site is significantly improved and smaller.  Remains enlarged but improved from yesterday.  Site appears moist and pink.  Skin:    General: Skin is cool and dry.  Neurological:     Mental Status: He is alert.             Vital Signs: BP 118/77   Pulse (!) 101   Temp 99.1 F (37.3 C) (Axillary)   Resp (!) 22   Ht 6' 1 (1.854 m)   Wt 52.3 kg   SpO2 100%   BMI 15.21 kg/m  SpO2: SpO2: 100 % O2 Device: O2 Device: NRB O2 Flow Rate: O2 Flow Rate (L/min): 15 L/min      Palliative Assessment/Data: Current: 20 to 30%   Palliative Care Assessment & Plan   Patient Profile/Assessment:  68 year old male admitted for small bowel obstruction in the setting of Ogilvie syndrome status post exploratory laparotomy.  Communication complicated by his baseline deaf/mute/legally blind status.  Requires interpreters.  Patient critically ill admission and has remained so during the entirety of his admission.  Continues to require ICU level of care.  Patient has varying levels of  cognition and when more alert will refuse care including labs and NG tube.  He will then become progressively more ill and require reinitiation of invasive medical care.  Multiple attempts at goals of care have been made.  Extensive discussions with his interpreters, legal guardian, and advocates who are very familiar with him.  Unfortunately, prognosis is very grim.  After discussion with attending, interpreter/advocates, and legal guardian decision was made to transition to DNR/DNI via two provider sign off (this was rotation RN attending physician Dr. Vicci).  Paperwork pending therefore he remains a full code in the interim.  Extensive and prolonged goals of care discussion completed on 07/04/2024.  Patient desires to stop invasive therapies and be left alone and prefers comfort directed approach.  Would like to transition to inpatient hospice for end-of-life care.  He understands that discontinuing current life-sustaining interventions will result in further decline and death.   Recommendations/Plan:  Coordinated with DSS staff members to obtain authorization to transition to DNR/DNI.  Appropriate paperwork completed by this NP and Dr. Vicci and certified as requested, approved, and therefore patient officially transferred to DNR/DNI status Patient desires to transition to comfort directed care and discharged to inpatient hospice with [facility] Collaborated with DSS legal guardian and supervisor who are in agreement with transition to comfort directed care and inpatient hospice TOC to reach out to hospice agency of preference to coordinate transfer to inpatient hospice Comfort orders initiated including comfort interventions and comfort meds ordered (patient prefers oral medications therefore, Tylenol  liquid and milk of mag ordered-patient preference), will also order IV medication for worsening symptoms not managed by oral meds Discontinue non-comfort meds/non comfort directed therapies, ok to  pull NG tube per patient request Palliative medicine team will continue to follow for ongoing goals of care discussion, symptom management, and coordination of care until patient transfers to IP hospice   Symptom management:  Ordered Tylenol  650 mg p.o. as needed Ordered Milk of magnesia 15 mg as needed Ordered lorazepam  1 mg IV every 4 hours as needed Ordered hydromorphone  1 mg every 2 hours as needed Ordered artificial tears  orders as needed Ordered Robinul  0.2 mg as needed Continue Zofran  as needed Continue promethazine  as needed Continue bowel regimen  Prognosis:  < 2 weeks    Discharge Planning: Hospice facility    Detailed review of medical records (labs, imaging, vital signs), medically appropriate exam, discussed with treatment team, counseling and education to patient, family, & staff, documenting clinical information, medication management, coordination of care   Billing based on MDM: High  Problems Addressed: One acute or chronic illness or injury that poses a threat to life or bodily function  Amount and/or Complexity of Data: Category 1:Assessment requiring an independent historian(s), Category 2:Independent interpretation of a test performed by another physician/other qualified health care professional (not separately reported), and Category 3:Discussion of management or test interpretation with external physician/other qualified health care professional/appropriate source (not separately reported)  Risks: Parenteral controlled substances         Laymon CHRISTELLA Pinal, NP  Palliative Medicine Team Team phone # [phone]  Thank you for allowing the Palliative Medicine Team to assist in the care of this patient. Please utilize secure chat with additional questions, if there is no response within 30 minutes please call the above phone number.  Palliative Medicine Team providers are available by phone from 7am to 7pm daily and can be reached through the team cell  phone.  Should this patient require assistance outside of these hours, please call the patient's attending physician.

## 2024-07-04 NOTE — Discharge Summary (Signed)
 Physician Discharge Summary  Martin [name] FMW:[id number] DOB: [birth year] DOA: 06/28/2024  PCP: [facility], [facility]  Admit date: 06/28/2024 Discharge date: 07/04/2024  Disposition: RESIDENTIAL HOSPICE  Recommendations for Outpatient Follow-up:  SYMPTOM MANAGEMENT PER HOSPICE PROTOCOL  Discharge Condition: HOSPICE   CODE STATUS: DNR  DNI  DIET: HOSPICE/COMFORT    Brief Hospitalization Summary: Please see all hospital notes, images, labs for full details of the hospitalization. ADMISSION PROVIDER HPI:  Martin [name] is a 68 y.o. 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  Patient history obtained through interpreter.  He has been having increased vomiting over the last 24 hours.  He does complain of abdominal pain but is unable to specify exact location of it.  He has not been eating and has had decreased output through his ostomy bag.  Due to all of this, he was brought to the hospital for evaluation.  He does have a history of refusing treatments.   HOSPITAL COURSE BY LISTED PROBLEMS ADDRESSED   Small bowel obstruction with possible internal hernia/ Ogilvie syndrome/ History of abdominal surgery including colectomy   POD # 6 -exploratory laparotomy with no resection  - Failed extubation attempt 06/30/2024 subsequently reintubated. - 07/01/24 -extubated successfully  - Hemodynamically stable, Pt insisted on NG removal 9/23 and it was too soon - unfortunately ileus persists and NG needs to be replaced and he should be kept NPO for now - Surgery following, agree with recommendation for replacing NG and make NPO - given patient consistently refusing treatments we have asked for palliative care consult for goals of care  -- after prolonged coordination and discussions with palliative care team, social workers, legal guardians and patient -- he ultimately has decided to go full comfort care  and to transfer to [facility] for residential hospice care.      Intake/Output Summary (Last 24 hours) at 07/04/2024 1450 Last data filed at 07/04/2024 1313    Gross per 24 hour  Intake 367.92 ml  Output 2000 ml  Net -1632.08 ml    - Pt requested NG be removed which is done and full comfort care orders have been put in place, DNR documentation completed and focus of care transitioning to comfort and dignity as of today    -off Precedex , off Levophed  Transitioned to full comfort measures on 07/04/24     Surgical abdominal wound-clean, colostomy present - appears healthy, pink, moist   Chronic kidney disease Stable at base line  Recent Labs           Lab Results  Component Value Date    CREATININE 0.76 07/01/2024    CREATININE 0.99 06/30/2024    CREATININE 0.91 06/29/2024      Hypothyroidism Had refused meds today, now on full comfort care     Elevated blood sugar Check hemoglobin A1c--5.1%  CBG (last 3)  Recent Labs (last 2 labs)       Recent Labs    07/03/24 1943 07/04/24 0411 07/04/24 0722  GLUCAP 107* 91 107*    --stopping blood sugar testing as he transitions to full comfort care    Visual and hearing impairments - Apparently patient committed through some kind interpreter - Partial visual acuity 1 eye still present   Discharge Diagnoses:  Principal Problem:   SBO (small bowel obstruction) (HCC) Active Problems:   Legally blind   Deaf   Hypothyroidism, unspecified   Adjustment disorder with mixed anxiety and depressed  mood   Ogilvie's syndrome   CKD (chronic kidney disease)   Discharge Instructions:  Allergies as of 07/04/2024       Reactions   Ivp Dye [iodinated Contrast Media] Other (See Comments)   Pt reports he has seizures.        Medication List     STOP taking these medications    acetaminophen  650 MG CR tablet Commonly known as: TYLENOL    Cholecalciferol 25 MCG (1000 UT) capsule   diclofenac  Sodium 1 % Gel Commonly known as:  VOLTAREN    ibuprofen  200 MG tablet Commonly known as: ADVIL    latanoprost  0.005 % ophthalmic solution Commonly known as: XALATAN    levothyroxine  125 MCG tablet Commonly known as: SYNTHROID    linaclotide  290 MCG Caps capsule Commonly known as: Linzess    polyethylene glycol 17 g packet Commonly known as: MIRALAX  / GLYCOLAX    Rhopressa 0.02 % Soln Generic drug: Netarsudil  Dimesylate       TAKE these medications    brimonidine  0.15 % ophthalmic solution Commonly known as: ALPHAGAN  Place 1 drop into the left eye 3 (three) times daily.   dorzolamide -timolol  2-0.5 % ophthalmic solution Commonly known as: COSOPT  Place 1 drop into both eyes 2 (two) times daily.        Allergies  Allergen Reactions   Ivp Dye [Iodinated Contrast Media] Other (See Comments)    Pt reports he has seizures.   Allergies as of 07/04/2024       Reactions   Ivp Dye [iodinated Contrast Media] Other (See Comments)   Pt reports he has seizures.        Medication List     STOP taking these medications    acetaminophen  650 MG CR tablet Commonly known as: TYLENOL    Cholecalciferol 25 MCG (1000 UT) capsule   diclofenac  Sodium 1 % Gel Commonly known as: VOLTAREN    ibuprofen  200 MG tablet Commonly known as: ADVIL    latanoprost  0.005 % ophthalmic solution Commonly known as: XALATAN    levothyroxine  125 MCG tablet Commonly known as: SYNTHROID    linaclotide  290 MCG Caps capsule Commonly known as: Linzess    polyethylene glycol 17 g packet Commonly known as: MIRALAX  / GLYCOLAX    Rhopressa 0.02 % Soln Generic drug: Netarsudil  Dimesylate       TAKE these medications    brimonidine  0.15 % ophthalmic solution Commonly known as: ALPHAGAN  Place 1 drop into the left eye 3 (three) times daily.   dorzolamide -timolol  2-0.5 % ophthalmic solution Commonly known as: COSOPT  Place 1 drop into both eyes 2 (two) times daily.        Procedures/Studies: DG CHEST PORT 1 VIEW Result Date:  07/03/2024 CLINICAL DATA:  Hypoxia EXAM: PORTABLE CHEST 1 VIEW COMPARISON:  Chest x-ray 07/03/2024 FINDINGS: Enteric tube is partially visualized to the level of the diaphragm. The heart is enlarged, unchanged. There are patchy perihilar opacities bilaterally. There is no pleural effusion or pneumothorax. No acute fractures are seen. IMPRESSION: 1. Patchy perihilar opacities bilaterally, which may represent pulmonary edema or infection. 2. Cardiomegaly. Electronically Signed   By: Greig Pique M.D.   On: 07/03/2024 23:17   DG Abd 1 View Result Date: 07/03/2024 CLINICAL DATA:  Hypoxia.  NG tube verification. EXAM: ABDOMEN - 1 VIEW COMPARISON:  07/03/2024 FINDINGS: Limited field of view for tube placement verification purposes. An enteric tube is present with tip projecting over the left upper quadrant consistent with location in the body of the stomach. Persisting gaseous distension of small and large bowel. Small amount of free  air is suggested under the right hemidiaphragm likely postoperative with evidence of recent surgery including skin clips in place. Patchy infiltrates in the lungs. Cardiac enlargement. IMPRESSION: Enteric tube tip projects over the left upper quadrant consistent with location in the body of the stomach. Electronically Signed   By: Elsie Gravely M.D.   On: 07/03/2024 23:16   DG CHEST PORT 1 VIEW Result Date: 07/03/2024 CLINICAL DATA:  10031 Cough 10031 141880 SOB (shortness of breath) 141880 EXAM: PORTABLE CHEST - 1 VIEW COMPARISON:  07/01/2024 FINDINGS: Esophagogastric tube terminates in the region of the stomach. Elevation of the left hemidiaphragm. Improved aeration of the lungs with decreasing perihilar airspace opacities, especially in the right lung base. No pleural effusion or pneumothorax. Mild cardiomegaly. Diffuse gaseous distension of the colon. IMPRESSION: 1. Decreasing perihilar airspace opacities, especially in the right lung base. 2. Similarly positioned  esophagogastric tube. Electronically Signed   By: Rogelia Myers M.D.   On: 07/03/2024 16:22   DG Abd Portable 1V Result Date: 07/03/2024 EXAM: 1 VIEW XRAY OF THE ABDOMEN 07/03/2024 12:54:00 PM COMPARISON: 07/03/2024 CLINICAL HISTORY: S/p ng tube placement FINDINGS: LINES, TUBES AND DEVICES: Enteric tube in place with tip and side port projecting over the stomach. BOWEL: Gaseous distention of small bowel and colon, unchanged from prior exam. SOFT TISSUES: Midline surgical staples noted. Regular sign identified within the left upper quadrant of the abdomen which is favored to represent residual pneumoperitoneum status post laparotomy. No opaque urinary calculi. BONES: No acute osseous abnormality. IMPRESSION: 1. Residual pneumoperitoneum in the left upper quadrant, favored to be postoperative following recent laparotomy. 2. Gaseous distention of small bowel and colon, unchanged from prior exam. 3. Enteric tube in place with tip and side port projecting over the stomach. The urgent finding will be called to the ordering provider by the Professional Radiology Assistants (PRAs) and documented in the [facility] dashboard. Electronically signed by: Waddell Calk MD 07/03/2024 01:39 PM EDT RP Workstation: [id number]   DG Abd 1 View Result Date: 07/03/2024 CLINICAL DATA:  Postoperative ileus. EXAM: ABDOMEN - 1 VIEW COMPARISON:  KUB 06/30/2024, CT 06/28/2024 FINDINGS: Air and stool present within the colon. There are several central air-filled dilated small bowel loops measuring up to 5.3 cm in diameter new compared to the prior exam. Mild fecal retention over the hepatic flexure and rectum. Ostomy site over the left lower abdomen. Vertical skin staples over the midline abdomen. Remainder of the exam is unchanged. IMPRESSION: Several central air-filled dilated small bowel loops measuring up to 5.3 cm in diameter which are new compared to the prior exam. Findings may be due to postoperative ileus versus small bowel  obstruction. Electronically Signed   By: Toribio Agreste M.D.   On: 07/03/2024 08:50   DG CHEST PORT 1 VIEW Result Date: 07/01/2024 EXAM: 1 VIEW XRAY OF THE CHEST 07/01/2024 07:28:00 AM COMPARISON: 06/30/2024 CLINICAL HISTORY: SOB (shortness of breath) 141880. Sob FINDINGS: LINES, TUBES AND DEVICES: Endotracheal tube in place with tip 4.5 cm above the carina. Gastric tube in place with tip in stomach. LUNGS AND PLEURA: Low lung volumes. Increased patchy opacities within the right perihilar and right lower lung zones. Unchanged atelectasis in the left base. HEART AND MEDIASTINUM: No acute abnormality of the cardiac and mediastinal silhouettes. BONES AND SOFT TISSUES: Abdominal midline surgical staples noted. Improved pneumoperitoneum compared to prior. IMPRESSION: 1. Increase right perihilar and right lower lung opacities with stable atelectasis in the left base. 2. Improved pneumoperitoneum compared to prior. 3. Low lung volumes.  Electronically signed by: Waddell Calk MD 07/01/2024 07:43 AM EDT RP Workstation: [id number]   DG CHEST PORT 1 VIEW Result Date: 06/30/2024 CLINICAL DATA:  Difficult airway for intubation. EXAM: PORTABLE CHEST 1 VIEW COMPARISON:  06/30/2024. FINDINGS: The heart size and mediastinal contours are stable. The endotracheal tube terminates 3.3 cm above the carina. Lung volumes are low with basilar atelectasis is. There is elevation of the left diaphragm. No consolidation or effusion is seen. There are subdiaphragmatic lucencies bilaterally which are unchanged from the previous exam. The stomach is distended with air. A nasogastric tube terminates in the stomach. No acute osseous abnormality. IMPRESSION: 1. Endotracheal tube terminates 3.3 cm above the carina. 2. Low lung volumes with basilar atelectasis. 3. Stable pneumoperitoneum with gaseous distension of the stomach, which may be related to recent surgery. Electronically Signed   By: Leita Waddell M.D.   On: 06/30/2024 14:20   DG Abd 1  View Result Date: 06/30/2024 CLINICAL DATA:  Ileus, small bowel dilatation. Status post laparotomy 2 days. EXAM: ABDOMEN - 1 VIEW COMPARISON:  Feb 11, 2024. FINDINGS: Nasogastric tube tip is seen in expected position of proximal stomach. June 28, 2024. Colostomy is noted in left lower quadrant. No small bowel dilatation is noted. Dilated transverse colon is noted with moderate amount of stool seen throughout the colon. Midline surgical staples are noted. Possible Rigler's sign is noted suggesting pneumoperitoneum most likely related to recent surgery. IMPRESSION: 1. Nasogastric tube tip seen in expected position of proximal stomach. 2. Dilated transverse colon is noted with moderate stool burden. No small bowel dilatation is noted. 3. Possible pneumoperitoneum is noted most likely related to recent surgery. Electronically Signed   By: Lynwood Landy Raddle M.D.   On: 06/30/2024 08:07   DG CHEST PORT 1 VIEW Result Date: 06/29/2024 EXAM: 1 VIEW XRAY OF THE CHEST 06/29/2024 01:13:33 AM COMPARISON: None available. CLINICAL HISTORY: Hypoxemia; ABG (arterial blood gas) abnormal. FINDINGS: LINES, TUBES AND DEVICES: Endotracheal tube terminates 2 cm above the carina. Enteric tube terminates in the gastric cardiac, in satisfactory position. LUNGS AND PLEURA: Mild left basilar scarring/atelectasis. No focal pulmonary opacity. No pulmonary edema. No pleural effusion. No pneumothorax. HEART AND MEDIASTINUM: No acute abnormality of the cardiac and mediastinal silhouettes. BONES AND SOFT TISSUES: No acute osseous abnormality. IMPRESSION: 1. Endotracheal tube terminates 2 cm above the carina. 2. Enteric tube terminates in the gastric cardia, in satisfactory position. Electronically signed by: Pinkie Pebbles MD 06/29/2024 01:17 AM EDT RP Workstation: [id number]   CT ABDOMEN PELVIS WO CONTRAST Result Date: 06/28/2024 CLINICAL DATA:  Abdominal pain with nausea and vomiting. EXAM: CT ABDOMEN AND PELVIS WITHOUT CONTRAST  TECHNIQUE: Multidetector CT imaging of the abdomen and pelvis was performed following the standard protocol without IV contrast. RADIATION DOSE REDUCTION: This exam was performed according to the departmental dose-optimization program which includes automated exposure control, adjustment of the mA and/or kV according to patient size and/or use of iterative reconstruction technique. COMPARISON:  CT abdomen and pelvis 02/12/2024. FINDINGS: Lower chest: There is atelectasis in the left lung base. There is mild elevation of the left hemidiaphragm. Hepatobiliary: No focal liver abnormality is seen. No gallstones, gallbladder wall thickening, or biliary dilatation. Pancreas: Unremarkable. No pancreatic ductal dilatation or surrounding inflammatory changes. Spleen: Normal in size without focal abnormality. Adrenals/Urinary Tract: Adrenal glands are unremarkable. Kidneys are normal, without renal calculi, focal lesion, or hydronephrosis. Bladder is unremarkable. Stomach/Bowel: The stomach is dilated with large air-fluid level. Jejunal loops are dilated measuring up to 4 cm  with air-fluid levels. Transition point is seen in the central abdomen image 4/66. Distal small bowel is decompressed. Left lower quadrant colostomy is again seen. There is a large amount of stool in the distal colon to the level of the ostomy. The appendix is not seen. There is no mesenteric edema, free air or pneumatosis. Vascular/Lymphatic: Aortic atherosclerosis. No enlarged abdominal or pelvic lymph nodes. Reproductive: Prostate gland is mildly enlarged. Other: No abdominal wall hernia or abnormality. No abdominopelvic ascites. Musculoskeletal: There are changes of avascular necrosis in the bilateral femoral heads, left greater than right. No evidence for femoral head collapse. Findings are unchanged. IMPRESSION: 1. High grade small-bowel obstruction with transition point in the central abdomen. Swirling of mesentery at this level may be related to  internal hernia. 2. Left lower quadrant colostomy. 3. Large amount of stool in the distal colon to the level of the ostomy. 4. 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 5. Aortic atherosclerosis. Aortic Atherosclerosis (ICD10-I70.0). Electronically Signed   By: Greig Pique M.D.   On: 06/28/2024 17:14     Discharge Exam: Vitals:   07/04/24 1200 07/04/24 1300  BP: 132/74 (!) 145/71  Pulse: 99 (!) 102  Resp: (!) 22 20  Temp:    SpO2: 99% 94%   Vitals:   07/04/24 1000 07/04/24 1100 07/04/24 1200 07/04/24 1300  BP: (!) 141/71 120/75 132/74 (!) 145/71  Pulse: (!) 101 (!) 104 99 (!) 102  Resp: (!) 26 (!) 22 (!) 22 20  Temp:      TempSrc:      SpO2: 99% 92% 99% 94%  Weight:      Height:        General exam: Appears chronically ill; cachectic, calm  Respiratory system: moderate increased work of breathing, rales heard and crackles.  Cardiovascular system: normal S1 & S2 heard. No JVD, murmurs, rubs, gallops or clicks. No pedal edema. Gastrointestinal system: Abdomen is mildly distended, soft and ostomy appears pink, moist. hypoactive bowel sounds heard. Central nervous system: arousable. No focal neurological deficits. Extremities: Symmetric 5 x 5 power. Skin: No rashes, lesions or ulcers. Psychiatry: Judgement and insight appear normal. Mood & affect appropriate.    The results of significant diagnostics from this hospitalization (including imaging, microbiology, ancillary and laboratory) are listed below for reference.     Microbiology: Recent Results (from the past 240 hours)  MRSA Next Gen by PCR, Nasal     Status: None   Collection Time: 06/28/24  9:37 PM   Specimen: Nasal Mucosa; Nasal Swab  Result Value Ref Range Status   MRSA by PCR Next Gen NOT DETECTED NOT DETECTED Final    Comment: (NOTE) The GeneXpert MRSA Assay (FDA approved for NASAL specimens only), is one component of a comprehensive MRSA colonization surveillance program. It is not intended to diagnose  MRSA infection nor to guide or monitor treatment for MRSA infections. Test performance is not FDA approved in patients less than 35 years old. Performed at [facility], [address], [place], [place] [address]   Culture, blood (Routine X 2) w Reflex to ID Panel     Status: None (Preliminary result)   Collection Time: 07/03/24  4:49 PM   Specimen: BLOOD  Result Value Ref Range Status   Specimen Description BLOOD BLOOD LEFT ARM  Final   Special Requests   Final    BOTTLES DRAWN AEROBIC AND ANAEROBIC Blood Culture adequate volume   Culture   Final    NO GROWTH < 12 HOURS  Performed at [facility], [address] [place], [place] [address]    Report Status PENDING  Incomplete  Culture, blood (Routine X 2) w Reflex to ID Panel     Status: None (Preliminary result)   Collection Time: 07/03/24  5:48 PM   Specimen: BLOOD  Result Value Ref Range Status   Specimen Description BLOOD BLOOD RIGHT ARM  Final   Special Requests   Final    AEROBIC BOTTLE ONLY Blood Culture results may not be optimal due to an inadequate volume of blood received in culture bottles   Culture   Final    NO GROWTH < 12 HOURS Performed at [facility], [address], [place], [place] [address]    Report Status PENDING  Incomplete     Labs: BNP (last 3 results) No results for input(s): BNP in the last 8760 hours. Basic Metabolic Panel: Recent Labs  Lab 06/28/24 1213 06/29/24 0130 06/30/24 1411 07/01/24 0744 07/03/24 1711  NA 139 143 140 138 140  K 4.2 4.2 3.6 3.3* 3.8  CL 99 107 107 104 104  CO2 26 24 24 23 24   GLUCOSE 189* 131* 150* 100* 126*  BUN 22 21 14 11 20   CREATININE 0.85 0.91 0.99 0.76 1.08  CALCIUM 10.1 8.6* 8.1* 8.0* 8.4*  MG  --  1.7  --   --   --   PHOS  --  2.6  --   --   --    Liver Function Tests: Recent Labs  Lab 06/28/24 1213 06/30/24 1411 07/01/24 0744  AST 21 22 22   ALT 13 8 10   ALKPHOS 123 70 72  BILITOT 0.9 0.9 1.3*  PROT 8.6* 5.6* 5.6*  ALBUMIN 4.1 2.6* 2.5*    Recent Labs  Lab 06/28/24 1213  LIPASE 33   No results for input(s): AMMONIA in the last 168 hours. CBC: Recent Labs  Lab 06/28/24 1213 06/29/24 0235 06/30/24 1411 07/01/24 0744 07/03/24 1649  WBC 7.6 9.9 7.0 6.4 5.8  NEUTROABS 6.4  --  5.2  --   --   HGB 13.8 12.3* 9.4* 9.9* 10.9*  HCT 42.8 37.7* 30.1* 31.9* 35.4*  MCV 89.0 87.3 92.0 90.1 91.7  PLT 232 189 135* 116* 260   Cardiac Enzymes: No results for input(s): CKTOTAL, CKMB, CKMBINDEX, TROPONINI in the last 168 hours. BNP: Invalid input(s): POCBNP CBG: Recent Labs  Lab 07/03/24 1131 07/03/24 1613 07/03/24 1943 07/04/24 0411 07/04/24 0722  GLUCAP 90 119* 107* 91 107*   D-Dimer No results for input(s): DDIMER in the last 72 hours. Hgb A1c No results for input(s): HGBA1C in the last 72 hours. Lipid Profile No results for input(s): CHOL, HDL, LDLCALC, TRIG, CHOLHDL, LDLDIRECT in the last 72 hours. Thyroid  function studies No results for input(s): TSH, T4TOTAL, T3FREE, THYROIDAB in the last 72 hours.  Invalid input(s): FREET3 Anemia work up No results for input(s): VITAMINB12, FOLATE, FERRITIN, TIBC, IRON , RETICCTPCT in the last 72 hours. Urinalysis    Component Value Date/Time   COLORURINE YELLOW 05/03/2024 1747   APPEARANCEUR CLEAR 05/03/2024 1747   LABSPEC 1.020 05/03/2024 1747   PHURINE 6.0 05/03/2024 1747   GLUCOSEU NEGATIVE 05/03/2024 1747   HGBUR NEGATIVE 05/03/2024 1747   BILIRUBINUR NEGATIVE 05/03/2024 1747   KETONESUR NEGATIVE 05/03/2024 1747   PROTEINUR NEGATIVE 05/03/2024 1747   NITRITE NEGATIVE 05/03/2024 1747   LEUKOCYTESUR NEGATIVE 05/03/2024 1747   Sepsis Labs Recent Labs  Lab 06/29/24 0235 06/30/24 1411 07/01/24 0744 07/03/24 1649  WBC 9.9 7.0  6.4 5.8   Microbiology Recent Results (from the past 240 hours)  MRSA Next Gen by PCR, Nasal     Status: None   Collection Time: 06/28/24  9:37 PM   Specimen: Nasal Mucosa; Nasal Swab   Result Value Ref Range Status   MRSA by PCR Next Gen NOT DETECTED NOT DETECTED Final    Comment: (NOTE) The GeneXpert MRSA Assay (FDA approved for NASAL specimens only), is one component of a comprehensive MRSA colonization surveillance program. It is not intended to diagnose MRSA infection nor to guide or monitor treatment for MRSA infections. Test performance is not FDA approved in patients less than 29 years old. Performed at [facility], [address], [place], [place] [address]   Culture, blood (Routine X 2) w Reflex to ID Panel     Status: None (Preliminary result)   Collection Time: 07/03/24  4:49 PM   Specimen: BLOOD  Result Value Ref Range Status   Specimen Description BLOOD BLOOD LEFT ARM  Final   Special Requests   Final    BOTTLES DRAWN AEROBIC AND ANAEROBIC Blood Culture adequate volume   Culture   Final    NO GROWTH < 12 HOURS Performed at [facility], [address], [place], [place] [address]    Report Status PENDING  Incomplete  Culture, blood (Routine X 2) w Reflex to ID Panel     Status: None (Preliminary result)   Collection Time: 07/03/24  5:48 PM   Specimen: BLOOD  Result Value Ref Range Status   Specimen Description BLOOD BLOOD RIGHT ARM  Final   Special Requests   Final    AEROBIC BOTTLE ONLY Blood Culture results may not be optimal due to an inadequate volume of blood received in culture bottles   Culture   Final    NO GROWTH < 12 HOURS Performed at [facility], [address] [place], [place] [address]    Report Status PENDING  Incomplete   Time coordinating discharge: 32 mins   SIGNED:  Afton Louder, MD  [facility] 07/04/2024, 3:58 PM How to contact the [facility] Attending or Consulting provider 7A - 7P or covering provider during after hours 7P -7A, for this patient?  Check the care team in [facility] and look for a) attending/consulting [facility] provider listed and b) the [facility] team listed Log into [email or web address] and use [place]'s universal  password to access. If you do not have the password, please contact the hospital operator. Locate the [facility] provider you are looking for under [facility] and page to a number that you can be directly reached. If you still have difficulty reaching the provider, please page the [facility] (Director on Call) for the Hospitalists listed on amion for assistance.

## 2024-07-04 NOTE — Progress Notes (Signed)
 [facility]  Patient is now full comfort care. Appreciate teams assistance in helping to respect the patient's wishes.  Manuelita Pander, MD [facility] [address] [place], [place] [address] [phone] (office)

## 2024-07-08 LAB — CULTURE, BLOOD (ROUTINE X 2)
Culture: NO GROWTH
Culture: NO GROWTH
Special Requests: ADEQUATE

## 2024-11-05 ENCOUNTER — Ambulatory Visit: Admitting: Orthopaedic Surgery

## 2024-11-19 ENCOUNTER — Ambulatory Visit: Admitting: Orthopaedic Surgery
